# Patient Record
Sex: Male | Born: 1950 | Race: White | Hispanic: No | Marital: Married | Smoking: Never smoker
Health system: Southern US, Community
[De-identification: ages and names within clinical notes are randomized; demographics above are authoritative.]

## PROBLEM LIST (undated history)

## (undated) DIAGNOSIS — F3289 Other specified depressive episodes: Secondary | ICD-10-CM

## (undated) DIAGNOSIS — E039 Hypothyroidism, unspecified: Secondary | ICD-10-CM

## (undated) DIAGNOSIS — D649 Anemia, unspecified: Secondary | ICD-10-CM

## (undated) DIAGNOSIS — M1712 Unilateral primary osteoarthritis, left knee: Secondary | ICD-10-CM

## (undated) DIAGNOSIS — M1711 Unilateral primary osteoarthritis, right knee: Secondary | ICD-10-CM

## (undated) DIAGNOSIS — E079 Disorder of thyroid, unspecified: Secondary | ICD-10-CM

## (undated) DIAGNOSIS — Z96651 Presence of right artificial knee joint: Secondary | ICD-10-CM

## (undated) DIAGNOSIS — H269 Unspecified cataract: Secondary | ICD-10-CM

## (undated) DIAGNOSIS — E119 Type 2 diabetes mellitus without complications: Secondary | ICD-10-CM

## (undated) DIAGNOSIS — N400 Enlarged prostate without lower urinary tract symptoms: Secondary | ICD-10-CM

## (undated) DIAGNOSIS — F329 Major depressive disorder, single episode, unspecified: Secondary | ICD-10-CM

## (undated) DIAGNOSIS — E7800 Pure hypercholesterolemia, unspecified: Secondary | ICD-10-CM

## (undated) DIAGNOSIS — E291 Testicular hypofunction: Secondary | ICD-10-CM

## (undated) DIAGNOSIS — K219 Gastro-esophageal reflux disease without esophagitis: Secondary | ICD-10-CM

## (undated) DIAGNOSIS — I2510 Atherosclerotic heart disease of native coronary artery without angina pectoris: Secondary | ICD-10-CM

## (undated) HISTORY — PX: INGUINAL HERNIA REPAIR: SUR1180

## (undated) HISTORY — PX: EYE SURGERY: SHX253

## (undated) HISTORY — DX: Hypothyroidism, unspecified: E03.9

## (undated) HISTORY — PX: COLONOSCOPY: SHX174

## (undated) HISTORY — DX: Benign prostatic hyperplasia without lower urinary tract symptoms: N40.0

## (undated) HISTORY — DX: Pure hypercholesterolemia, unspecified: E78.00

## (undated) HISTORY — PX: KNEE ARTHROSCOPY W/ ACL RECONSTRUCTION: SHX1858

## (undated) HISTORY — DX: Disorder of thyroid, unspecified: E07.9

## (undated) HISTORY — DX: Testicular hypofunction: E29.1

## (undated) HISTORY — DX: Unspecified cataract: H26.9

## (undated) HISTORY — PX: JOINT REPLACEMENT: SHX530

## (undated) HISTORY — DX: Atherosclerotic heart disease of native coronary artery without angina pectoris: I25.10

## (undated) HISTORY — DX: Gastro-esophageal reflux disease without esophagitis: K21.9

## (undated) HISTORY — PX: KNEE ARTHROSCOPY: SHX127

## (undated) HISTORY — DX: Other specified depressive episodes: F32.89

## (undated) HISTORY — PX: ROTATOR CUFF REPAIR: SHX139

## (undated) HISTORY — DX: Major depressive disorder, single episode, unspecified: F32.9

## (undated) HISTORY — DX: Type 2 diabetes mellitus without complications: E11.9

---

## 1997-11-16 ENCOUNTER — Ambulatory Visit (HOSPITAL_COMMUNITY): Admission: RE | Admit: 1997-11-16 | Discharge: 1997-11-16 | Payer: Self-pay | Admitting: Urology

## 1998-11-28 ENCOUNTER — Ambulatory Visit (HOSPITAL_BASED_OUTPATIENT_CLINIC_OR_DEPARTMENT_OTHER): Admission: RE | Admit: 1998-11-28 | Discharge: 1998-11-28 | Payer: Self-pay | Admitting: Orthopedic Surgery

## 2000-07-15 HISTORY — PX: OTHER SURGICAL HISTORY: SHX169

## 2000-07-23 ENCOUNTER — Encounter: Admission: RE | Admit: 2000-07-23 | Discharge: 2000-10-21 | Payer: Self-pay | Admitting: Endocrinology

## 2001-02-21 ENCOUNTER — Encounter: Payer: Self-pay | Admitting: Gastroenterology

## 2002-05-23 HISTORY — PX: VASECTOMY: SHX75

## 2003-06-16 HISTORY — PX: ESOPHAGOGASTRODUODENOSCOPY: SHX1529

## 2004-07-05 ENCOUNTER — Ambulatory Visit: Payer: Self-pay | Admitting: Endocrinology

## 2004-07-13 ENCOUNTER — Ambulatory Visit: Payer: Self-pay | Admitting: Endocrinology

## 2005-02-07 ENCOUNTER — Ambulatory Visit: Payer: Self-pay | Admitting: Endocrinology

## 2006-03-19 ENCOUNTER — Ambulatory Visit: Payer: Self-pay | Admitting: Endocrinology

## 2006-04-10 ENCOUNTER — Ambulatory Visit: Payer: Self-pay | Admitting: Endocrinology

## 2006-04-10 LAB — CONVERTED CEMR LAB
ALT: 15 units/L (ref 0–40)
AST: 21 units/L (ref 0–37)
Albumin: 3.7 g/dL (ref 3.5–5.2)
Alkaline Phosphatase: 69 units/L (ref 39–117)
BUN: 14 mg/dL (ref 6–23)
Basophils Absolute: 0.1 10*3/uL (ref 0.0–0.1)
Basophils Relative: 0.9 % (ref 0.0–1.0)
Bilirubin Urine: NEGATIVE
Bilirubin, Direct: 0.1 mg/dL (ref 0.0–0.3)
CO2: 29 meq/L (ref 19–32)
Calcium: 9.1 mg/dL (ref 8.4–10.5)
Chloride: 105 meq/L (ref 96–112)
Cholesterol: 122 mg/dL (ref 0–200)
Creatinine, Ser: 1.1 mg/dL (ref 0.4–1.5)
Creatinine,U: 65.8 mg/dL
Eosinophils Absolute: 0.5 10*3/uL (ref 0.0–0.6)
Eosinophils Relative: 5.9 % — ABNORMAL HIGH (ref 0.0–5.0)
GFR calc Af Amer: 89 mL/min
GFR calc non Af Amer: 74 mL/min
Glucose, Bld: 95 mg/dL (ref 70–99)
HCT: 43.8 % (ref 39.0–52.0)
HDL: 32.5 mg/dL — ABNORMAL LOW (ref >39.0)
Hemoglobin: 14.3 g/dL (ref 13.0–17.0)
Hgb A1c MFr Bld: 6.9 % — ABNORMAL HIGH (ref 4.6–6.0)
Ketones, ur: NEGATIVE mg/dL
LDL Cholesterol: 61 mg/dL (ref 0–99)
Leukocytes, UA: NEGATIVE
Lymphocytes Relative: 22.2 % (ref 12.0–46.0)
MCHC: 32.5 g/dL (ref 30.0–36.0)
MCV: 84.7 fL (ref 78.0–100.0)
Microalb Creat Ratio: 9.1 mg/g (ref 0.0–30.0)
Microalb, Ur: 0.6 mg/dL (ref 0.0–1.9)
Monocytes Absolute: 0.4 10*3/uL (ref 0.2–0.7)
Monocytes Relative: 4.5 % (ref 3.0–11.0)
Neutro Abs: 6 10*3/uL (ref 1.4–7.7)
Neutrophils Relative %: 66.5 % (ref 43.0–77.0)
Nitrite: NEGATIVE
PSA: 1.41 ng/mL (ref 0.10–4.00)
Platelets: 213 10*3/uL (ref 150–400)
Potassium: 4.4 meq/L (ref 3.5–5.1)
RBC: 5.17 M/uL (ref 4.22–5.81)
RDW: 13.1 % (ref 11.5–14.6)
Sodium: 141 meq/L (ref 135–145)
Specific Gravity, Urine: 1.015 (ref 1.000–1.03)
TSH: 9.09 microintl units/mL — ABNORMAL HIGH (ref 0.35–5.50)
Testosterone: 462.22 ng/dL (ref 350.00–890)
Total Bilirubin: 0.6 mg/dL (ref 0.3–1.2)
Total CHOL/HDL Ratio: 3.8
Total Protein, Urine: NEGATIVE mg/dL
Total Protein: 6.4 g/dL (ref 6.0–8.3)
Triglycerides: 141 mg/dL (ref 0–149)
Urine Glucose: NEGATIVE mg/dL
Urobilinogen, UA: 0.2 (ref 0.0–1.0)
VLDL: 28 mg/dL (ref 0–40)
WBC: 9 10*3/uL (ref 4.5–10.5)
pH: 5.5 (ref 5.0–8.0)

## 2006-04-11 ENCOUNTER — Ambulatory Visit: Payer: Self-pay | Admitting: Endocrinology

## 2006-06-06 ENCOUNTER — Ambulatory Visit: Payer: Self-pay | Admitting: Endocrinology

## 2006-06-06 LAB — CONVERTED CEMR LAB: Hgb A1c MFr Bld: 6.1 % — ABNORMAL HIGH (ref 4.6–6.0)

## 2006-09-20 ENCOUNTER — Encounter: Payer: Self-pay | Admitting: Endocrinology

## 2006-09-20 DIAGNOSIS — E119 Type 2 diabetes mellitus without complications: Secondary | ICD-10-CM | POA: Insufficient documentation

## 2007-03-03 ENCOUNTER — Telehealth (INDEPENDENT_AMBULATORY_CARE_PROVIDER_SITE_OTHER): Payer: Self-pay | Admitting: *Deleted

## 2007-03-05 ENCOUNTER — Ambulatory Visit: Payer: Self-pay | Admitting: Endocrinology

## 2007-03-05 DIAGNOSIS — E039 Hypothyroidism, unspecified: Secondary | ICD-10-CM | POA: Insufficient documentation

## 2007-03-06 ENCOUNTER — Encounter (INDEPENDENT_AMBULATORY_CARE_PROVIDER_SITE_OTHER): Payer: Self-pay | Admitting: *Deleted

## 2007-03-07 ENCOUNTER — Ambulatory Visit: Payer: Self-pay | Admitting: Endocrinology

## 2007-06-18 ENCOUNTER — Ambulatory Visit: Payer: Self-pay | Admitting: Endocrinology

## 2007-06-19 ENCOUNTER — Ambulatory Visit: Payer: Self-pay | Admitting: Endocrinology

## 2007-06-19 DIAGNOSIS — E1169 Type 2 diabetes mellitus with other specified complication: Secondary | ICD-10-CM | POA: Insufficient documentation

## 2007-06-19 DIAGNOSIS — F329 Major depressive disorder, single episode, unspecified: Secondary | ICD-10-CM | POA: Insufficient documentation

## 2007-06-19 DIAGNOSIS — E291 Testicular hypofunction: Secondary | ICD-10-CM | POA: Insufficient documentation

## 2007-06-19 DIAGNOSIS — F32A Depression, unspecified: Secondary | ICD-10-CM | POA: Insufficient documentation

## 2007-06-19 DIAGNOSIS — E785 Hyperlipidemia, unspecified: Secondary | ICD-10-CM

## 2007-06-19 LAB — CONVERTED CEMR LAB
AST: 23 units/L (ref 0–37)
Alkaline Phosphatase: 54 units/L (ref 39–117)
Basophils Absolute: 0 10*3/uL (ref 0.0–0.1)
Bilirubin, Direct: 0.1 mg/dL (ref 0.0–0.3)
Chloride: 106 meq/L (ref 96–112)
Cholesterol: 122 mg/dL (ref 0–200)
Eosinophils Absolute: 0.5 10*3/uL (ref 0.0–0.7)
Eosinophils Relative: 6.8 % — ABNORMAL HIGH (ref 0.0–5.0)
GFR calc non Af Amer: 74 mL/min
LDL Cholesterol: 73 mg/dL (ref 0–99)
MCHC: 32.6 g/dL (ref 30.0–36.0)
MCV: 87 fL (ref 78.0–100.0)
Neutrophils Relative %: 65.5 % (ref 43.0–77.0)
Platelets: 167 10*3/uL (ref 150–400)
Potassium: 4.3 meq/L (ref 3.5–5.1)
Sodium: 139 meq/L (ref 135–145)
Total Bilirubin: 0.6 mg/dL (ref 0.3–1.2)
Urine Glucose: NEGATIVE mg/dL
Urobilinogen, UA: 0.2 (ref 0.0–1.0)
VLDL: 12 mg/dL (ref 0–40)
WBC: 7.9 10*3/uL (ref 4.5–10.5)

## 2007-08-11 ENCOUNTER — Encounter: Payer: Self-pay | Admitting: Endocrinology

## 2007-08-21 ENCOUNTER — Ambulatory Visit: Payer: Self-pay | Admitting: Internal Medicine

## 2007-08-21 DIAGNOSIS — H9319 Tinnitus, unspecified ear: Secondary | ICD-10-CM | POA: Insufficient documentation

## 2007-09-12 ENCOUNTER — Encounter: Payer: Self-pay | Admitting: Endocrinology

## 2007-09-18 ENCOUNTER — Encounter: Admission: RE | Admit: 2007-09-18 | Discharge: 2007-09-18 | Payer: Self-pay | Admitting: Otolaryngology

## 2007-10-30 ENCOUNTER — Ambulatory Visit: Payer: Self-pay | Admitting: Endocrinology

## 2007-11-01 LAB — CONVERTED CEMR LAB
Hgb A1c MFr Bld: 6.6 % — ABNORMAL HIGH (ref 4.6–6.0)
PSA: 1.72 ng/mL (ref 0.10–4.00)

## 2007-11-06 ENCOUNTER — Ambulatory Visit: Payer: Self-pay | Admitting: Endocrinology

## 2007-11-06 DIAGNOSIS — H9190 Unspecified hearing loss, unspecified ear: Secondary | ICD-10-CM | POA: Insufficient documentation

## 2007-11-12 ENCOUNTER — Telehealth (INDEPENDENT_AMBULATORY_CARE_PROVIDER_SITE_OTHER): Payer: Self-pay | Admitting: *Deleted

## 2008-01-27 ENCOUNTER — Telehealth (INDEPENDENT_AMBULATORY_CARE_PROVIDER_SITE_OTHER): Payer: Self-pay | Admitting: *Deleted

## 2008-02-09 ENCOUNTER — Telehealth: Payer: Self-pay | Admitting: Endocrinology

## 2008-02-25 ENCOUNTER — Ambulatory Visit: Payer: Self-pay | Admitting: Gastroenterology

## 2008-03-03 ENCOUNTER — Telehealth: Payer: Self-pay | Admitting: Gastroenterology

## 2008-03-15 ENCOUNTER — Ambulatory Visit: Payer: Self-pay | Admitting: Endocrinology

## 2008-03-17 LAB — CONVERTED CEMR LAB
ALT: 25 units/L (ref 0–53)
AST: 28 units/L (ref 0–37)
Albumin: 3.7 g/dL (ref 3.5–5.2)
BUN: 15 mg/dL (ref 6–23)
Basophils Absolute: 0.1 10*3/uL (ref 0.0–0.1)
Basophils Relative: 0.6 % (ref 0.0–3.0)
CO2: 28 meq/L (ref 19–32)
Calcium: 9.1 mg/dL (ref 8.4–10.5)
Chloride: 106 meq/L (ref 96–112)
Cholesterol: 116 mg/dL (ref 0–200)
Creatinine, Ser: 1.1 mg/dL (ref 0.4–1.5)
Eosinophils Relative: 6.5 % — ABNORMAL HIGH (ref 0.0–5.0)
Glucose, Bld: 103 mg/dL — ABNORMAL HIGH (ref 70–99)
Hemoglobin: 14.2 g/dL (ref 13.0–17.0)
Hgb A1c MFr Bld: 6.8 % — ABNORMAL HIGH (ref 4.6–6.0)
Ketones, ur: NEGATIVE mg/dL
Lymphocytes Relative: 17.5 % (ref 12.0–46.0)
MCHC: 33.6 g/dL (ref 30.0–36.0)
Neutro Abs: 6.5 10*3/uL (ref 1.4–7.7)
PSA: 3.26 ng/mL (ref 0.10–4.00)
RBC: 4.98 M/uL (ref 4.22–5.81)
Specific Gravity, Urine: 1.015 (ref 1.000–1.03)
Total CHOL/HDL Ratio: 3.1
Total Protein: 6 g/dL (ref 6.0–8.3)
Triglycerides: 62 mg/dL (ref 0–149)
Urine Glucose: NEGATIVE mg/dL
WBC: 9.5 10*3/uL (ref 4.5–10.5)
pH: 5.5 (ref 5.0–8.0)

## 2008-03-18 ENCOUNTER — Telehealth (INDEPENDENT_AMBULATORY_CARE_PROVIDER_SITE_OTHER): Payer: Self-pay | Admitting: *Deleted

## 2008-03-18 ENCOUNTER — Ambulatory Visit: Payer: Self-pay | Admitting: Endocrinology

## 2008-03-19 ENCOUNTER — Encounter: Payer: Self-pay | Admitting: Endocrinology

## 2008-03-19 ENCOUNTER — Ambulatory Visit: Payer: Self-pay | Admitting: Internal Medicine

## 2008-03-25 ENCOUNTER — Telehealth: Payer: Self-pay | Admitting: Gastroenterology

## 2008-04-07 ENCOUNTER — Telehealth (INDEPENDENT_AMBULATORY_CARE_PROVIDER_SITE_OTHER): Payer: Self-pay | Admitting: *Deleted

## 2008-07-28 ENCOUNTER — Telehealth (INDEPENDENT_AMBULATORY_CARE_PROVIDER_SITE_OTHER): Payer: Self-pay | Admitting: *Deleted

## 2008-07-29 ENCOUNTER — Telehealth (INDEPENDENT_AMBULATORY_CARE_PROVIDER_SITE_OTHER): Payer: Self-pay | Admitting: *Deleted

## 2008-11-12 ENCOUNTER — Telehealth: Payer: Self-pay | Admitting: Endocrinology

## 2009-01-17 ENCOUNTER — Telehealth: Payer: Self-pay | Admitting: Endocrinology

## 2009-03-03 ENCOUNTER — Encounter: Payer: Self-pay | Admitting: Endocrinology

## 2009-03-29 ENCOUNTER — Ambulatory Visit: Payer: Self-pay | Admitting: Endocrinology

## 2009-03-31 ENCOUNTER — Telehealth: Payer: Self-pay | Admitting: Endocrinology

## 2009-07-28 ENCOUNTER — Ambulatory Visit: Payer: Self-pay | Admitting: Endocrinology

## 2009-07-28 LAB — CONVERTED CEMR LAB: Hgb A1c MFr Bld: 6.8 % — ABNORMAL HIGH (ref 4.6–6.5)

## 2009-10-21 ENCOUNTER — Ambulatory Visit: Payer: Self-pay | Admitting: Endocrinology

## 2009-10-21 DIAGNOSIS — R209 Unspecified disturbances of skin sensation: Secondary | ICD-10-CM | POA: Insufficient documentation

## 2009-10-21 LAB — CONVERTED CEMR LAB
Hgb A1c MFr Bld: 7.4 % — ABNORMAL HIGH (ref 4.6–6.5)
Vitamin B-12: 268 pg/mL (ref 211–911)

## 2009-12-09 ENCOUNTER — Ambulatory Visit: Payer: Self-pay | Admitting: Endocrinology

## 2009-12-09 DIAGNOSIS — L738 Other specified follicular disorders: Secondary | ICD-10-CM | POA: Insufficient documentation

## 2010-02-02 ENCOUNTER — Telehealth: Payer: Self-pay | Admitting: Endocrinology

## 2010-02-07 ENCOUNTER — Telehealth: Payer: Self-pay | Admitting: Endocrinology

## 2010-02-08 ENCOUNTER — Telehealth: Payer: Self-pay | Admitting: Endocrinology

## 2010-02-08 ENCOUNTER — Other Ambulatory Visit: Payer: Self-pay | Admitting: Endocrinology

## 2010-02-08 ENCOUNTER — Ambulatory Visit
Admission: RE | Admit: 2010-02-08 | Discharge: 2010-02-08 | Payer: Self-pay | Source: Home / Self Care | Attending: Endocrinology | Admitting: Endocrinology

## 2010-02-08 LAB — HEMOGLOBIN A1C: Hgb A1c MFr Bld: 7 % — ABNORMAL HIGH (ref 4.6–6.5)

## 2010-02-08 LAB — LIPID PANEL
Cholesterol: 146 mg/dL (ref 0–200)
HDL: 47.1 mg/dL (ref >39.00)
LDL Cholesterol: 84 mg/dL (ref 0–99)
Total CHOL/HDL Ratio: 3
Triglycerides: 73 mg/dL (ref 0.0–149.0)
VLDL: 14.6 mg/dL (ref 0.0–40.0)

## 2010-02-08 LAB — BASIC METABOLIC PANEL
BUN: 19 mg/dL (ref 6–23)
CO2: 29 mEq/L (ref 19–32)
Calcium: 9.6 mg/dL (ref 8.4–10.5)
Chloride: 106 mEq/L (ref 96–112)
Creatinine, Ser: 1.2 mg/dL (ref 0.4–1.5)
GFR: 67.68 mL/min (ref >60.00)
Glucose, Bld: 89 mg/dL (ref 70–99)
Potassium: 4.8 mEq/L (ref 3.5–5.1)
Sodium: 142 mEq/L (ref 135–145)

## 2010-02-08 LAB — URINALYSIS
Bilirubin Urine: NEGATIVE
Ketones, ur: NEGATIVE
Leukocytes, UA: NEGATIVE
Nitrite: NEGATIVE
Specific Gravity, Urine: 1.02 (ref 1.000–1.030)
Total Protein, Urine: NEGATIVE
Urine Glucose: NEGATIVE
Urobilinogen, UA: 0.2 (ref 0.0–1.0)
pH: 5.5 (ref 5.0–8.0)

## 2010-02-08 LAB — CBC WITH DIFFERENTIAL/PLATELET
Basophils Absolute: 0 10*3/uL (ref 0.0–0.1)
Basophils Relative: 0.2 % (ref 0.0–3.0)
Eosinophils Absolute: 0.3 10*3/uL (ref 0.0–0.7)
Eosinophils Relative: 2.6 % (ref 0.0–5.0)
HCT: 40.7 % (ref 39.0–52.0)
Hemoglobin: 13.8 g/dL (ref 13.0–17.0)
Lymphocytes Relative: 14.1 % (ref 12.0–46.0)
Lymphs Abs: 1.4 10*3/uL (ref 0.7–4.0)
MCHC: 33.8 g/dL (ref 30.0–36.0)
MCV: 89.2 fl (ref 78.0–100.0)
Monocytes Absolute: 0.5 10*3/uL (ref 0.1–1.0)
Monocytes Relative: 5.5 % (ref 3.0–12.0)
Neutro Abs: 7.5 10*3/uL (ref 1.4–7.7)
Neutrophils Relative %: 77.6 % — ABNORMAL HIGH (ref 43.0–77.0)
Platelets: 181 10*3/uL (ref 150.0–400.0)
RBC: 4.57 Mil/uL (ref 4.22–5.81)
RDW: 14.7 % — ABNORMAL HIGH (ref 11.5–14.6)
WBC: 9.6 10*3/uL (ref 4.5–10.5)

## 2010-02-08 LAB — HEPATIC FUNCTION PANEL
ALT: 25 U/L (ref 0–53)
AST: 27 U/L (ref 0–37)
Albumin: 4 g/dL (ref 3.5–5.2)
Alkaline Phosphatase: 55 U/L (ref 39–117)
Bilirubin, Direct: 0.1 mg/dL (ref 0.0–0.3)
Total Bilirubin: 0.4 mg/dL (ref 0.3–1.2)
Total Protein: 6.2 g/dL (ref 6.0–8.3)

## 2010-02-08 LAB — TSH: TSH: 2.81 u[IU]/mL (ref 0.35–5.50)

## 2010-02-08 LAB — PSA: PSA: 3.31 ng/mL (ref 0.10–4.00)

## 2010-02-15 ENCOUNTER — Encounter: Payer: Self-pay | Admitting: Gastroenterology

## 2010-02-19 LAB — CONVERTED CEMR LAB
Albumin: 3.8 g/dL (ref 3.5–5.2)
Basophils Absolute: 0 10*3/uL (ref 0.0–0.1)
Basophils Relative: 0.2 % (ref 0.0–3.0)
Bilirubin Urine: NEGATIVE
CO2: 31 meq/L (ref 19–32)
Calcium: 9.3 mg/dL (ref 8.4–10.5)
Chloride: 110 meq/L (ref 96–112)
Creatinine, Ser: 1.2 mg/dL (ref 0.4–1.5)
Creatinine,U: 170.8 mg/dL
Eosinophils Absolute: 0.3 10*3/uL (ref 0.0–0.7)
Fecal Occult Blood: NEGATIVE
Glucose, Bld: 123 mg/dL — ABNORMAL HIGH (ref 70–99)
HDL: 44 mg/dL (ref >39.00)
Hemoglobin: 14.1 g/dL (ref 13.0–17.0)
Hgb A1c MFr Bld: 6.8 % — ABNORMAL HIGH (ref 4.6–6.5)
Ketones, ur: NEGATIVE mg/dL
Leukocytes, UA: NEGATIVE
Lymphocytes Relative: 16.9 % (ref 12.0–46.0)
MCHC: 32.9 g/dL (ref 30.0–36.0)
MCV: 89.5 fL (ref 78.0–100.0)
Microalb, Ur: 1.6 mg/dL (ref 0.0–1.9)
Monocytes Absolute: 0.5 10*3/uL (ref 0.1–1.0)
Neutro Abs: 7.3 10*3/uL (ref 1.4–7.7)
OCCULT 4: NEGATIVE
OCCULT 5: NEGATIVE
RBC: 4.8 M/uL (ref 4.22–5.81)
RDW: 12.8 % (ref 11.5–14.6)
Sodium: 143 meq/L (ref 135–145)
Specific Gravity, Urine: >=1.03 (ref 1.000–1.030)
TSH: 2.35 microintl units/mL (ref 0.35–5.50)
Total CHOL/HDL Ratio: 3
Total Protein, Urine: NEGATIVE mg/dL
Total Protein: 6.5 g/dL (ref 6.0–8.3)
Triglycerides: 138 mg/dL (ref 0.0–149.0)
Urine Glucose: 100 mg/dL
pH: 5.5 (ref 5.0–8.0)

## 2010-02-23 NOTE — Assessment & Plan Note (Signed)
Summary: FU AND REFILLS/NWS  #   Vital Signs:  Patient profile:   60 year old male Height:      66 inches (167.64 cm) Weight:      159.50 pounds (72.50 kg) BMI:     25.84 O2 Sat:      96 % on Room air Temp:     97.0 degrees F (36.11 degrees C) oral Pulse rate:   80 / minute BP sitting:   124 / 78  (left arm) Cuff size:   regular  Vitals Entered By: Josph Macho RMA (March 29, 2009 10:04 AM)  O2 Flow:  Room air CC: Follow-up visit/ pt needs refills on Zocor/ CF Is Patient Diabetic? Yes   CC:  Follow-up visit/ pt needs refills on Zocor/ CF.  History of Present Illness: here for regular wellness examination.  He's feeling pretty well in general, and does not smoke.  alcohol is rare.  Current Medications (verified): 1)  Multivitamins   Tabs (Multiple Vitamin) .... Take 1 By Mouth Qd 2)  Bl Vitamin E 400 Unit  Caps (Vitamin E) .... Take 1 By Mouth Qd 3)  Bl Vitamin C 500 Mg  Tabs (Ascorbic Acid) .... Take 1 By Mouth Qd 4)  Clomiphene Citrate 50 Mg  Tabs (Clomiphene Citrate) .... Take 1/4 Tab Qiw 5)  Zocor 80 Mg  Tabs (Simvastatin) .... Take 1 By Mouth Qd 6)  Januvia 100 Mg Tabs (Sitagliptin Phosphate) .... Take 1 Tablet By Mouth Once A Day 7)  Metformin Hcl 500 Mg Tb24 (Metformin Hcl) .... Take 2 Tablet By Mouth Twice A Day 8)  Synthroid 50 Mcg Tabs (Levothyroxine Sodium) .... Take 1 Tablet By Mouth Once A Day 9)  Actos 15 Mg  Tabs (Pioglitazone Hcl) .... Qd 10)  Cialis 20 Mg Tabs (Tadalafil) .... For As Needed Use 11)  Fish Oil 1000mg  .... Once Daily  Allergies (verified): No Known Drug Allergies  Past History:  Past Medical History: Last updated: 06/19/2007 DEPRESSIVE DISORDER NOT ELSEWHERE CLASSIFIED (ICD-311) HYPERCHOLESTEROLEMIA (ICD-272.0) HYPOGONADISM, MALE (ICD-257.2) SPECIAL SCREENING MALIGNANT NEOPLASM OF PROSTATE (ICD-V76.44) UNSPECIFIED HYPOTHYROIDISM (ICD-244.9) DIABETES MELLITUS, TYPE II (ICD-250.00)  Family History: Reviewed history from 03/18/2008  and no changes required. no cancer  Social History: Reviewed history from 03/18/2008 and no changes required. Never Smoked Alcohol use-yes works for [employer]  Review of Systems  The patient denies weight loss, weight gain, vision loss, decreased hearing, chest pain, syncope, dyspnea on exertion, prolonged cough, headaches, abdominal pain, melena, hematochezia, severe indigestion/heartburn, hematuria, suspicious skin lesions, and depression.         ne fever, other than a recent illness.  Physical Exam  General:  normal appearance.   Head:  head: no deformity eyes: no periorbital swelling, no proptosis external nose and ears are normal mouth: no lesion seen Neck:  Supple without thyroid enlargement or tenderness.  Lungs:  Clear to auscultation bilaterally. Normal respiratory effort.  Heart:  Regular rate and rhythm without murmurs or gallops noted. Normal S1,S2.   Abdomen:  abdomen is soft, nontender.  no hepatosplenomegaly.   not distended.  no hernia  Rectal:  normal external and internal exam.  heme neg  Prostate:  Normal size prostate without masses or tenderness.  Msk:  muscle bulk and strength are grossly normal.  no obvious joint swelling.  gait is normal and steady  Pulses:  dorsalis pedis intact bilat.  no carotid bruit  Extremities:  no deformity.  no ulcer on the feet.  feet are of  normal color and temp.  no edema  Neurologic:  sensation is intact to touch on the feet cn 2-12 grossly intact.   readily moves all 4's.    Skin:  normal texture and temp.  no rash.  not diaphoretic  Cervical Nodes:  No significant adenopathy.  Psych:  Alert and cooperative; normal mood and affect; normal attention span and concentration.     Impression & Recommendations:  Problem # 1:  ROUTINE GENERAL MEDICAL EXAM@HEALTH  CARE FACL (ICD-V70.0)  Medications Added to Medication List This Visit: 1)  Fish Oil 1000mg   .... Once daily 2)  Cialis 5 Mg Tabs (Tadalafil) .Marland Kitchen.. 1 once  daily  Other Orders: EKG w/ Interpretation (93000) Pneumococcal Vaccine (56213) Admin 1st Vaccine (08657) TLB-Lipid Panel (80061-LIPID) TLB-BMP (Basic Metabolic Panel-BMET) (80048-METABOL) TLB-CBC Platelet - w/Differential (85025-CBCD) TLB-Hepatic/Liver Function Pnl (80076-HEPATIC) TLB-TSH (Thyroid Stimulating Hormone) (84443-TSH) TLB-A1C / Hgb A1C (Glycohemoglobin) (83036-A1C) TLB-Microalbumin/Creat Ratio, Urine (82043-MALB) TLB-PSA (Prostate Specific Antigen) (84153-PSA) TLB-Testosterone, Total (84403-TESTO) TLB-Udip w/ Micro (81001-URINE) Est. Patient 40-64 years (84696)   Patient Instructions: 1)  pending the test results, please continue the same medications for now 2)  tests are being ordered for you today.  a few days after the test(s), please call [phone] to hear your test results. 3)  Please schedule a follow-up appointment in 6 months. 4)  we discussed the recommendations of the preventive services task force 5)  (update: i left message on phone-tree:  rx as we discussed) Prescriptions: ZOCOR 80 MG  TABS (SIMVASTATIN) take 1 by mouth qd  #90 x 3   Entered and Authorized by:   Minus Breeding MD   Signed by:   Minus Breeding MD on 03/29/2009   Method used:   Electronically to        [facility] [address] (retail)       [phone]       [place], [place]  [address]       Ph: [phone]       Fax: [phone]   RxID:   [id number] CLOMIPHENE CITRATE 50 MG  TABS (CLOMIPHENE CITRATE) take 1/4 tab qiw  #5 Each x 11   Entered and Authorized by:   Minus Breeding MD   Signed by:   Minus Breeding MD on 03/29/2009   Method used:   Electronically to        [facility] [address] (retail)       [phone]       [place], [place]  [address]       Ph: [phone]       Fax: [phone]   RxID:   [id number] CIALIS 5 MG TABS (TADALAFIL) 1 once daily  #30 x 11   Entered and Authorized by:    Minus Breeding MD   Signed by:   Minus Breeding MD on 03/29/2009   Method used:   Electronically to        [facility] [address] (retail)       [phone]       [place], [place]  [address]       Ph: [phone]       Fax: [phone]   RxID:   [id number]    Immunizations Administered:  Pneumonia Vaccine:    Vaccine Type: Pneumovax    Site: left deltoid  Mfr: Merck    Dose: 0.5 ml    Route: IM    Given by: Josph Macho RMA    Exp. Date: 09/05/2010    Lot #: [id number]    VIS given: 08/20/95 version given March 29, 2009.

## 2010-02-23 NOTE — Assessment & Plan Note (Signed)
Summary: numb finger/cd   Vital Signs:  Patient profile:   60 year old male Height:      67 inches (170.18 cm) Weight:      160 pounds (72.73 kg) BMI:     25.15 O2 Sat:      95 % on Room air Temp:     98.9 degrees F (37.17 degrees C) oral Pulse rate:   72 / minute BP sitting:   114 / 72  (left arm) Cuff size:   regular  Vitals Entered By: Brenton Grills MA (October 21, 2009 1:25 PM)  O2 Flow:  Room air CC: numbness in left pinky finger, knot under right arm/aj Is Patient Diabetic? Yes   CC:  numbness in left pinky finger and knot under right arm/aj.  History of Present Illness: pt states few days of slight numbness at the left little finger, worse at the distal, palmar aspect, at the finger pad.  no assoc numbness.  unable to cite precip factor, such as local injury.   also few days of swelling at the right axilla  Current Medications (verified): 1)  Multivitamins   Tabs (Multiple Vitamin) .... Take 1 By Mouth Qd 2)  Bl Vitamin E 400 Unit  Caps (Vitamin E) .... Take 1 By Mouth Qd 3)  Bl Vitamin C 500 Mg  Tabs (Ascorbic Acid) .... Take 1 By Mouth Qd 4)  Clomiphene Citrate 50 Mg  Tabs (Clomiphene Citrate) .... Take 1/4 Tab Qiw 5)  Zocor 80 Mg  Tabs (Simvastatin) .... Take 1 By Mouth Qd 6)  Januvia 100 Mg Tabs (Sitagliptin Phosphate) .... Take 1 Tablet By Mouth Once A Day 7)  Metformin Hcl 500 Mg Tb24 (Metformin Hcl) .... Take 2 Tablet By Mouth Twice A Day 8)  Synthroid 50 Mcg Tabs (Levothyroxine Sodium) .... Take 1 Tablet By Mouth Once A Day 9)  Actos 15 Mg  Tabs (Pioglitazone Hcl) .... Qd 10)  Fish Oil 1000mg  .... 1400 Mg Once Daily 11)  Cialis 5 Mg Tabs (Tadalafil) .Marland Kitchen.. 1 Once Daily  Allergies (verified): No Known Drug Allergies  Past History:  Past Medical History: Last updated: 06/19/2007 DEPRESSIVE DISORDER NOT ELSEWHERE CLASSIFIED (ICD-311) HYPERCHOLESTEROLEMIA (ICD-272.0) HYPOGONADISM, MALE (ICD-257.2) SPECIAL SCREENING MALIGNANT NEOPLASM OF PROSTATE  (ICD-V76.44) UNSPECIFIED HYPOTHYROIDISM (ICD-244.9) DIABETES MELLITUS, TYPE II (ICD-250.00)  Review of Systems  The patient denies fever.         also has left axillary pain  Physical Exam  General:  normal appearance.   Msk:  right axilla: has 1 cm area of erythema and swelling, but no fluctuance.  no drainage. Extremities:  left little finger: normal color and temp.  there are oa changes.  there are several mild deviations at the pip and dip joints (pt says these are chronic).  Neurologic:  sensation is decreased to touch on the palmar aspect of the left little finger.   Additional Exam:   Hemoglobin A1C       [H]  7.4 %       Impression & Recommendations:  Problem # 1:  NUMBNESS (ICD-782.0) uncertain etiology  Problem # 2:  pustule new  Problem # 3:  DIABETES MELLITUS, TYPE II (ICD-250.00) Assessment: Deteriorated  Medications Added to Medication List This Visit: 1)  Fish Oil 1000mg   .... 1400 mg once daily 2)  Doxycycline Hyclate 100 Mg Caps (Doxycycline hyclate) .Marland Kitchen.. 1 tab two times a day 3)  Actos 45 Mg Tabs (Pioglitazone hcl) .Marland Kitchen.. 1 tab once daily  Other Orders: TLB-B12,  Serum-Total ONLY (82607-B12) TLB-A1C / Hgb A1C (Glycohemoglobin) (83036-A1C) Est. Patient Level IV (98119)  Patient Instructions: 1)  blood tests are being ordered for you today.  please call [phone] to hear your test results. 2)  i am happy to refer you to a neurologist to investigate the cause--please call if you want to go. 3)  doxycycline 100 mg two times a day 4)  (update: i left message on phone-tree:  increase actos to 45 mg once daily). Prescriptions: ACTOS 45 MG TABS (PIOGLITAZONE HCL) 1 tab once daily  #90 x 3   Entered and Authorized by:   Minus Breeding MD   Signed by:   Minus Breeding MD on 10/21/2009   Method used:   Faxed to ...       [facility] (mail-order)             , [place]         Ph: [phone]       Fax: [phone]   RxID:   [id number] DOXYCYCLINE HYCLATE 100 MG  CAPS (DOXYCYCLINE HYCLATE) 1 tab two times a day  #14 x 0   Entered and Authorized by:   Minus Breeding MD   Signed by:   Minus Breeding MD on 10/21/2009   Method used:   Electronically to        [facility] [phone]* (retail)       [address]       [place], [place]  [address]       Ph: [phone]       Fax: [phone]   RxID:   [id number] JANUVIA 100 MG TABS (SITAGLIPTIN PHOSPHATE) Take 1 tablet by mouth once a day  #30 x 11   Entered and Authorized by:   Minus Breeding MD   Signed by:   Minus Breeding MD on 10/21/2009   Method used:   Print then Give to Patient   RxID:   [id number]

## 2010-02-23 NOTE — Letter (Signed)
Summary: Colonoscopy Letter  [facility]  [address] [place], [place] [address]   Phone: [phone]  Fax: [phone]      February 15, 2010 MRN: [id number]   David Maynard [address] [place], [place]  [address]   Dear David Maynard,   According to your medical record, it is time for you to schedule a Colonoscopy. The American Cancer Society recommends this procedure as a method to detect early colon cancer. Patients with a family history of colon cancer, or a personal history of colon polyps or inflammatory bowel disease are at increased risk.  This letter has been generated based on the recommendations made at the time of your procedure. If you feel that in your particular situation this may no longer apply, please contact our office.  Please call our office at [phone] to schedule this appointment or to update your records at your earliest convenience.  Thank you for cooperating with [place] to provide you with the very best care possible.   Sincerely,  Judie Petit T. Russella Dar, M.D.  [facility] [phone]

## 2010-02-23 NOTE — Progress Notes (Signed)
Summary: Lab req  Phone Note Call from Patient Call back at Home Phone [phone]   Caller: Patient [phone] Summary of Call: Pt called stating he was told by SAE that he can call back to have labs done anytime (CPX and PSA). Pt is requesting to have labs done next week but he is not due for CPX until March. Should pt be scheduled for labs now or should he be advised to wait until CPX? Initial call taken by: Margaret Pyle, CMA,  February 02, 2010 11:39 AM  Follow-up for Phone Call        ok for cpx labs (including psa), and a1c 250.00 Follow-up by: Minus Breeding MD,  February 02, 2010 12:32 PM  Additional Follow-up for Phone Call Additional follow up Details #1::        Labs scheduled and pt aware Additional Follow-up by: Margaret Pyle, CMA,  February 02, 2010 3:05 PM

## 2010-02-23 NOTE — Progress Notes (Signed)
Summary: Rx refill req  Phone Note Refill Request Message from:  Patient  Refills Requested: Medication #1:  CIALIS 5 MG TABS 1 once daily   Dosage confirmed as above?Dosage Confirmed   Supply Requested: 1 month pt requesting refill until CPX due in March   Method Requested: Electronic Initial call taken by: Margaret Pyle, CMA,  February 07, 2010 3:19 PM    Prescriptions: CIALIS 5 MG TABS (TADALAFIL) 1 once daily  #30 x 1   Entered by:   Margaret Pyle, CMA   Authorized by:   Minus Breeding MD   Signed by:   Margaret Pyle, CMA on 02/07/2010   Method used:   Electronically to        [facility]  [phone]* (retail)       [address]       [place], [place]  [address]       Ph: [phone] or [phone]       Fax: [phone]   RxID:   [id number]

## 2010-02-23 NOTE — Progress Notes (Signed)
Summary: rx re-sent  Phone Note Call from Patient   Caller: Patient [phone] Summary of Call: pt called stating that his RX's were sent to [facility] on [address] but pt needs Zocor to [facility].Cialis to [facility] [address]. Rx's sent. Initial call taken by: Margaret Pyle, CMA,  March 31, 2009 9:26 AM    Prescriptions: CIALIS 5 MG TABS (TADALAFIL) 1 once daily  #30 x 11   Entered by:   Margaret Pyle, CMA   Authorized by:   Minus Breeding MD   Signed by:   Margaret Pyle, CMA on 03/31/2009   Method used:   Electronically to        [facility]  [phone]* (retail)       [address]       [place], [place]  [address]       Ph: [phone] or [phone]       Fax: [phone]   RxID:   [id number] ZOCOR 80 MG  TABS (SIMVASTATIN) take 1 by mouth qd  #90 x 3   Entered by:   Margaret Pyle, CMA   Authorized by:   Minus Breeding MD   Signed by:   Margaret Pyle, CMA on 03/31/2009   Method used:   Electronically to        [facility]* (mail-order)             ,          Ph: [phone]       Fax: [phone]   RxID:   [id number]

## 2010-02-23 NOTE — Assessment & Plan Note (Signed)
Summary: lump under left armpit/#/cd   Vital Signs:  Patient profile:   60 year old male Height:      67 inches (170.18 cm) Weight:      157 pounds (71.36 kg) BMI:     24.68 O2 Sat:      95 % on Room air Temp:     98.0 degrees F (36.67 degrees C) oral Pulse rate:   73 / minute BP sitting:   116 / 70  (left arm) Cuff size:   regular  Vitals Entered By: Brenton Grills CMA (AAMA) (December 09, 2009 2:00 PM)  O2 Flow:  Room air CC: Lump under left armpit/aj Is Patient Diabetic? Yes   CC:  Lump under left armpit/aj.  History of Present Illness: 1 day of slight pain at the left axilla, and assoc lump there.  Current Medications (verified): 1)  Multivitamins   Tabs (Multiple Vitamin) .... Take 1 By Mouth Qd 2)  Bl Vitamin E 400 Unit  Caps (Vitamin E) .... Take 1 By Mouth Qd 3)  Bl Vitamin C 500 Mg  Tabs (Ascorbic Acid) .... Take 1 By Mouth Qd 4)  Clomiphene Citrate 50 Mg  Tabs (Clomiphene Citrate) .... Take 1/4 Tab Qiw 5)  Zocor 80 Mg  Tabs (Simvastatin) .... Take 1 By Mouth Qd 6)  Januvia 100 Mg Tabs (Sitagliptin Phosphate) .... Take 1 Tablet By Mouth Once A Day 7)  Metformin Hcl 500 Mg Tb24 (Metformin Hcl) .... Take 2 Tablet By Mouth Twice A Day 8)  Synthroid 50 Mcg Tabs (Levothyroxine Sodium) .... Take 1 Tablet By Mouth Once A Day 9)  Fish Oil 1000mg  .... 1400 Mg Once Daily 10)  Cialis 5 Mg Tabs (Tadalafil) .Marland Kitchen.. 1 Once Daily 11)  Doxycycline Hyclate 100 Mg Caps (Doxycycline Hyclate) .Marland Kitchen.. 1 Tab Two Times A Day 12)  Actos 45 Mg Tabs (Pioglitazone Hcl) .Marland Kitchen.. 1 Tab Once Daily  Allergies (verified): No Known Drug Allergies  Past History:  Past Medical History: Last updated: 06/19/2007 DEPRESSIVE DISORDER NOT ELSEWHERE CLASSIFIED (ICD-311) HYPERCHOLESTEROLEMIA (ICD-272.0) HYPOGONADISM, MALE (ICD-257.2) SPECIAL SCREENING MALIGNANT NEOPLASM OF PROSTATE (ICD-V76.44) UNSPECIFIED HYPOTHYROIDISM (ICD-244.9) DIABETES MELLITUS, TYPE II (ICD-250.00)  Review of Systems  The  patient denies fever.    Physical Exam  General:  normal appearance.   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 Patient Level III ([address])  Patient Instructions: 1)  doxycycline 100 mg two times a day 2)  physical is due in 4 months.   Prescriptions: CIALIS 5 MG TABS (TADALAFIL) 1 once daily  #30 x 11   Entered and Authorized by:   Minus Breeding MD   Signed by:   Minus Breeding MD on 12/09/2009   Method used:   Print then Give to Patient   RxID:   [id number] DOXYCYCLINE HYCLATE 100 MG CAPS (DOXYCYCLINE HYCLATE) 1 tab two times a day  #14 x 2   Entered and Authorized by:   Minus Breeding MD   Signed by:   Minus Breeding MD on 12/09/2009   Method used:   Electronically to        [facility] [phone]* (retail)       [address]       [place], [place]  [address]       Ph: [phone]  Fax: [phone]   RxID:   [id number]    Orders Added: 1)  Est. Patient Level III [[address]]

## 2010-02-23 NOTE — Assessment & Plan Note (Signed)
Summary: lump under left arm/#/cd   Vital Signs:  Patient profile:   60 year old male Height:      67 inches (170.18 cm) Weight:      161.13 pounds (73.24 kg) BMI:     25.33 O2 Sat:      96 % on Room air Temp:     98.6 degrees F (37.00 degrees C) oral Pulse rate:   69 / minute BP sitting:   102 / 64  (left arm) Cuff size:   regular  Vitals Entered By: Brenton Grills MA (July 28, 2009 11:29 AM)  O2 Flow:  Room air CC: lump under left arm/pt is taking Vitamin E only 3 times a week and Cialis as needed/aj   CC:  lump under left arm/pt is taking Vitamin E only 3 times a week and Cialis as needed/aj.  History of Present Illness: pt states 9 days of slight nodule at the right axilla, and associated pain.  it has resolved.    Current Medications (verified): 1)  Multivitamins   Tabs (Multiple Vitamin) .... Take 1 By Mouth Qd 2)  Bl Vitamin E 400 Unit  Caps (Vitamin E) .... Take 1 By Mouth Qd 3)  Bl Vitamin C 500 Mg  Tabs (Ascorbic Acid) .... Take 1 By Mouth Qd 4)  Clomiphene Citrate 50 Mg  Tabs (Clomiphene Citrate) .... Take 1/4 Tab Qiw 5)  Zocor 80 Mg  Tabs (Simvastatin) .... Take 1 By Mouth Qd 6)  Januvia 100 Mg Tabs (Sitagliptin Phosphate) .... Take 1 Tablet By Mouth Once A Day 7)  Metformin Hcl 500 Mg Tb24 (Metformin Hcl) .... Take 2 Tablet By Mouth Twice A Day 8)  Synthroid 50 Mcg Tabs (Levothyroxine Sodium) .... Take 1 Tablet By Mouth Once A Day 9)  Actos 15 Mg  Tabs (Pioglitazone Hcl) .... Qd 10)  Fish Oil 1000mg  .... Once Daily 11)  Cialis 5 Mg Tabs (Tadalafil) .Marland Kitchen.. 1 Once Daily  Allergies (verified): No Known Drug Allergies  Past History:  Past Medical History: Last updated: 06/19/2007 DEPRESSIVE DISORDER NOT ELSEWHERE CLASSIFIED (ICD-311) HYPERCHOLESTEROLEMIA (ICD-272.0) HYPOGONADISM, MALE (ICD-257.2) SPECIAL SCREENING MALIGNANT NEOPLASM OF PROSTATE (ICD-V76.44) UNSPECIFIED HYPOTHYROIDISM (ICD-244.9) DIABETES MELLITUS, TYPE II (ICD-250.00)  Review of  Systems  The patient denies fever.    Physical Exam  General:  normal appearance.   Axillary Nodes:  none palpable Additional Exam:  Hemoglobin A1C       [H]  6.8 %   Impression & Recommendations:  Problem # 1:  DIABETES MELLITUS, TYPE II (ICD-250.00) well-controlled  Problem # 2:  probable lymph node resolved  Other Orders: TLB-A1C / Hgb A1C (Glycohemoglobin) (83036-A1C) Est. Patient Level II (04540)  Patient Instructions: 1)  please return if the lump under your arm recurs. 2)  blood tests are being ordered for you today.  please call [phone] to hear your test results. 3)  physical is due march, 2012 4)  (update: i left message on phone-tree:  a1c is good.  same rx)

## 2010-02-23 NOTE — Progress Notes (Signed)
Summary: Cialis prescription  Phone Note Call from Patient Call back at [phone]   Caller: Patient Call For: Minus Breeding MD Summary of Call: Patient called requesting a prescription for Cialis 20mg  and would like a 3 month supply. The patient found a prescription written in Nov.for Cialis 5mg . written by Dr. Everardo All. Initial call taken by: Zella Ball Ewing CMA Duncan Dull),  February 08, 2010 11:33 AM  Follow-up for Phone Call        sent Follow-up by: Minus Breeding MD,  February 08, 2010 12:16 PM    New/Updated Medications: CIALIS 20 MG TABS (TADALAFIL) for as needed use Prescriptions: CIALIS 20 MG TABS (TADALAFIL) for as needed use  #30 x 3   Entered by:   Margaret Pyle, CMA   Authorized by:   Minus Breeding MD   Signed by:   Margaret Pyle, CMA on 02/08/2010   Method used:   Electronically to        [facility]  [phone]* (retail)       [address]       [place], [place]  [address]       Ph: [phone] or [phone]       Fax: [phone]   RxID:   [id number] CIALIS 20 MG TABS (TADALAFIL) for as needed use  #30 x 3   Entered and Authorized by:   Minus Breeding MD   Signed by:   Minus Breeding MD on 02/08/2010   Method used:   Electronically to        [facility] [phone]* (retail)       [address]       [place], [place]  [address]       Ph: [phone]       Fax: [phone]   RxID:   [id number]

## 2010-02-23 NOTE — Letter (Signed)
Summary: [facility]   Imported By: Lester Penton 03/08/2009 08:14:43  _____________________________________________________________________  External Attachment:    Type:   Image     Comment:   External Document

## 2010-02-28 ENCOUNTER — Encounter (INDEPENDENT_AMBULATORY_CARE_PROVIDER_SITE_OTHER): Payer: Self-pay | Admitting: *Deleted

## 2010-03-09 NOTE — Letter (Signed)
Summary: Pre Visit Letter Revised  [facility]  [address] [place], [place] [address]   Phone: [phone]  Fax: [phone]        02/28/2010 MRN: [id number] David Maynard [address] [place], [place]  [address]             Procedure Date:  04-13-10   Welcome to the Gastroenterology Division at [facility].    You are scheduled to see a nurse for your pre-procedure visit on 03-30-10 at 9:00a.m. on the 3rd floor at [facility], [address]  We ask that you try to arrive at our office 15 minutes prior to your appointment time to allow for check-in.  Please take a minute to review the attached form.  If you answer "Yes" to one or more of the questions on the first page, we ask that you call the person listed at your earliest opportunity.  If you answer "No" to all of the questions, please complete the rest of the form and bring it to your appointment.    Your nurse visit will consist of discussing your medical and surgical history, your immediate family medical history, and your medications.   If you are unable to list all of your medications on the form, please bring the medication bottles to your appointment and we will list them.  We will need to be aware of both prescribed and over the counter drugs.  We will need to know exact dosage information as well.    Please be prepared to read and sign documents such as consent forms, a financial agreement, and acknowledgement forms.  If necessary, and with your consent, a friend or relative is welcome to sit-in on the nurse visit with you.  Please bring your insurance card so that we may make a copy of it.  If your insurance requires a referral to see a specialist, please bring your referral form from your primary care physician.  No co-pay is required for this nurse visit.     If you cannot keep your appointment, please call [phone] to cancel or reschedule prior to your appointment date.  This allows [place]  the opportunity to schedule an appointment for another patient in need of care.    Thank you for choosing [facility] for your medical needs.  We appreciate the opportunity to care for you.  Please visit [place] at our website  to learn more about our practice.  Sincerely, The Gastroenterology Division

## 2010-03-14 ENCOUNTER — Telehealth: Payer: Self-pay | Admitting: Endocrinology

## 2010-03-21 NOTE — Progress Notes (Signed)
Summary: rx refill req  Phone Note Refill Request Message from:  Fax from Pharmacy on March 14, 2010 8:52 AM  Refills Requested: Medication #1:  CLOMIPHENE CITRATE 50 MG  TABS take 1/4 tab qiw   Dosage confirmed as above?Dosage Confirmed   Last Refilled: 03/13/2010  Method Requested: Electronic Next Appointment Scheduled: 03/23/2010 Initial call taken by: Brenton Grills CMA Duncan Dull),  March 14, 2010 8:52 AM    Prescriptions: CLOMIPHENE CITRATE 50 MG  TABS (CLOMIPHENE CITRATE) take 1/4 tab qiw  #5 Each x 2   Entered by:   Brenton Grills CMA (AAMA)   Authorized by:   Minus Breeding MD   Signed by:   Brenton Grills CMA (AAMA) on 03/14/2010   Method used:   Electronically to        [facility] [address] (retail)       [phone]       [place], [place]  [address]       Ph: [phone]       Fax: [phone]   RxID:   [id number]

## 2010-03-23 ENCOUNTER — Other Ambulatory Visit: Payer: 59

## 2010-03-23 ENCOUNTER — Encounter: Payer: Self-pay | Admitting: Endocrinology

## 2010-03-23 ENCOUNTER — Other Ambulatory Visit: Payer: Self-pay | Admitting: Endocrinology

## 2010-03-23 ENCOUNTER — Ambulatory Visit (INDEPENDENT_AMBULATORY_CARE_PROVIDER_SITE_OTHER): Payer: 59 | Admitting: Endocrinology

## 2010-03-23 DIAGNOSIS — E119 Type 2 diabetes mellitus without complications: Secondary | ICD-10-CM

## 2010-03-23 DIAGNOSIS — E291 Testicular hypofunction: Secondary | ICD-10-CM

## 2010-03-23 LAB — CONVERTED CEMR LAB: Fructosamine: 272 micromoles/L (ref <285)

## 2010-03-28 ENCOUNTER — Encounter (INDEPENDENT_AMBULATORY_CARE_PROVIDER_SITE_OTHER): Payer: Self-pay | Admitting: *Deleted

## 2010-03-30 ENCOUNTER — Encounter (INDEPENDENT_AMBULATORY_CARE_PROVIDER_SITE_OTHER): Payer: Self-pay | Admitting: *Deleted

## 2010-03-30 ENCOUNTER — Encounter: Payer: Self-pay | Admitting: *Deleted

## 2010-03-30 ENCOUNTER — Encounter: Payer: Self-pay | Admitting: Gastroenterology

## 2010-03-30 DIAGNOSIS — E1165 Type 2 diabetes mellitus with hyperglycemia: Secondary | ICD-10-CM | POA: Insufficient documentation

## 2010-03-30 DIAGNOSIS — E119 Type 2 diabetes mellitus without complications: Secondary | ICD-10-CM | POA: Insufficient documentation

## 2010-04-04 NOTE — Miscellaneous (Signed)
Summary: [facility] Previsit/prep  Clinical Lists Changes  Medications: Added new medication of MOVIPREP 100 GM  SOLR (PEG-KCL-NACL-NASULF-NA ASC-C) As per prep instructions. - Signed Rx of MOVIPREP 100 GM  SOLR (PEG-KCL-NACL-NASULF-NA ASC-C) As per prep instructions.;  #1 x 0;  Signed;  Entered by: Wyona Almas RN;  Authorized by: Meryl Dare MD [facility];  Method used: Electronically to [facility]. #[id number]*, [address] [place], [place], [place]  [address], Ph: [phone], Fax: [phone] Observations: Added new observation of NKA: T (03/30/2010 8:47)    Prescriptions: MOVIPREP 100 GM  SOLR (PEG-KCL-NACL-NASULF-NA ASC-C) As per prep instructions.  #1 x 0   Entered by:   Wyona Almas RN   Authorized by:   Meryl Dare MD [facility]   Signed by:   Wyona Almas RN on 03/30/2010   Method used:   Electronically to        [facility]. [phone]* (retail)       [address]       [place], [place]  [address]       Ph: [phone]       Fax: [phone]   RxID:   [id number]

## 2010-04-04 NOTE — Letter (Signed)
Summary: Diabetic Instructions  [facility]  [address] [place], [place] [address]   Phone: [phone]  Fax: [phone]    David Maynard Jan 17, 1951 MRN: [id number]   _x  _   ORAL DIABETIC MEDICATION INSTRUCTIONS  The day before your procedure:   Take your diabetic pill as you do normally  The day of your procedure:   Do not take your diabetic pill    We will check your blood sugar levels during the admission process and again in Recovery before discharging you home  ________________________________________________________________________

## 2010-04-04 NOTE — Letter (Signed)
Summary: [facility] Instructions  [facility]  [address] [place], [place] [address]   Phone: [phone]  Fax: [phone]       David Maynard    April 09, 1950    MRN: [id number]        Procedure Day /Date: Thursday 04/13/2010     Arrival Time: 7:30AM     Procedure Time: 8:30AM     Location of Procedure:                    _X_  [facility] (4th Floor)                       PREPARATION FOR COLONOSCOPY WITH MOVIPREP   Starting 5 days prior to your procedure 04/08/10 do not eat nuts, seeds, popcorn, corn, beans, peas,  salads, or any raw vegetables.  Do not take any fiber supplements (e.g. Metamucil, Citrucel, and Benefiber).  THE DAY BEFORE YOUR PROCEDURE         DATE: 3/21   DAY: Wed.  1.  Drink clear liquids the entire day-NO SOLID FOOD  2.  Do not drink anything colored red or purple.  Avoid juices with pulp.  No orange juice.  3.  Drink at least 64 oz. (8 glasses) of fluid/clear liquids during the day to prevent dehydration and help the prep work efficiently.  CLEAR LIQUIDS INCLUDE: Water Jello Ice Popsicles Tea (sugar ok, no milk/cream) Powdered fruit flavored drinks Coffee (sugar ok, no milk/cream) Gatorade Juice: apple, white grape, white cranberry  Lemonade Clear bullion, consomm, broth Carbonated beverages (any kind) Strained chicken noodle soup Hard Candy                             4.  In the morning, mix first dose of MoviPrep solution:    Empty 1 Pouch A and 1 Pouch B into the disposable container    Add lukewarm drinking water to the top line of the container. Mix to dissolve    Refrigerate (mixed solution should be used within 24 hrs)  5.  Begin drinking the prep at 5:00 p.m. The MoviPrep container is divided by 4 marks.   Every 15 minutes drink the solution down to the next mark (approximately 8 oz) until the full liter is complete.   6.  Follow completed prep with 16 oz of clear liquid of your choice (Nothing red or  purple).  Continue to drink clear liquids until bedtime.  7.  Before going to bed, mix second dose of MoviPrep solution:    Empty 1 Pouch A and 1 Pouch B into the disposable container    Add lukewarm drinking water to the top line of the container. Mix to dissolve    Refrigerate  THE DAY OF YOUR PROCEDURE      DATE: 3/22   DAY: Magdalene Molly.  Beginning at 3:30AM (5 hours before procedure):         1. Every 15 minutes, drink the solution down to the next mark (approx 8 oz) until the full liter is complete.  2. Follow completed prep with 16 oz. of clear liquid of your choice.    3. You may drink clear liquids until 6:30AM (2 HOURS BEFORE PROCEDURE).   MEDICATION INSTRUCTIONS  Unless otherwise instructed, you should take regular prescription medications with a small sip of water   as early as possible the morning of your procedure.  Diabetic patients - see separate instructions.         OTHER INSTRUCTIONS  You will need a responsible adult at least 60 years of age to accompany you and drive you home.   This person must remain in the waiting room during your procedure.  Wear loose fitting clothing that is easily removed.  Leave jewelry and other valuables at home.  However, you may wish to bring a book to read or  an iPod/MP3 player to listen to music as you wait for your procedure to start.  Remove all body piercing jewelry and leave at home.  Total time from sign-in until discharge is approximately 2-3 hours.  You should go home directly after your procedure and rest.  You can resume normal activities the  day after your procedure.  The day of your procedure you should not:   Drive   Make legal decisions   Operate machinery   Drink alcohol   Return to work  You will receive specific instructions about eating, activities and medications before you leave.    The above instructions have been reviewed and explained to me by   Wyona Almas RN  March 30, 2010 9:34  AM     I fully understand and can verbalize these instructions _____________________________ Date _________

## 2010-04-04 NOTE — Assessment & Plan Note (Signed)
Summary: fu.lb   Vital Signs:  Patient profile:   60 year old male Height:      67 inches (170.18 cm) Weight:      154.75 pounds (70.34 kg) BMI:     24.32 O2 Sat:      98 % on Room air Temp:     98.6 degrees F (37.00 degrees C) oral Pulse rate:   74 / minute Pulse rhythm:   regular BP sitting:   118 / 70  (left arm) Cuff size:   regular  Vitals Entered By: Brenton Grills CMA Duncan Dull) (March 23, 2010 10:25 AM)  O2 Flow:  Room air CC: Follow-up visit/aj Is Patient Diabetic? Yes   CC:  Follow-up visit/aj.  History of Present Illness: pt says he has lost weight due to the demands of a new job.  he now works part-time.  he says weight-loss has helped his dm control.  Current Medications (verified): 1)  Multivitamins   Tabs (Multiple Vitamin) .... Take 1 By Mouth Qd 2)  Bl Vitamin E 400 Unit  Caps (Vitamin E) .... Take 1 By Mouth Qd 3)  Bl Vitamin C 500 Mg  Tabs (Ascorbic Acid) .... Take 1 By Mouth Qd 4)  Clomiphene Citrate 50 Mg  Tabs (Clomiphene Citrate) .... Take 1/4 Tab Qiw 5)  Zocor 80 Mg  Tabs (Simvastatin) .... Take 1 By Mouth Qd 6)  Januvia 100 Mg Tabs (Sitagliptin Phosphate) .... Take 1 Tablet By Mouth Once A Day 7)  Metformin Hcl 500 Mg Tb24 (Metformin Hcl) .... Take 2 Tablet By Mouth Twice A Day 8)  Synthroid 50 Mcg Tabs (Levothyroxine Sodium) .... Take 1 Tablet By Mouth Once A Day 9)  Fish Oil 1000mg  .... 1400 Mg Once Daily 10)  Doxycycline Hyclate 100 Mg Caps (Doxycycline Hyclate) .Marland Kitchen.. 1 Tab Two Times A Day 11)  Actos 45 Mg Tabs (Pioglitazone Hcl) .Marland Kitchen.. 1 Tab Once Daily 12)  Cialis 20 Mg Tabs (Tadalafil) .... For As Needed Use  Allergies (verified): No Known Drug Allergies  Past History:  Past Medical History: Last updated: 06/19/2007 DEPRESSIVE DISORDER NOT ELSEWHERE CLASSIFIED (ICD-311) HYPERCHOLESTEROLEMIA (ICD-272.0) HYPOGONADISM, MALE (ICD-257.2) SPECIAL SCREENING MALIGNANT NEOPLASM OF PROSTATE (ICD-V76.44) UNSPECIFIED HYPOTHYROIDISM (ICD-244.9) DIABETES  MELLITUS, TYPE II (ICD-250.00)  Social History: Reviewed history from 03/18/2008 and no changes required. Never Smoked Alcohol use-yes  Review of Systems  The patient denies chest pain and dyspnea on exertion.    Physical Exam  General:  normal appearance.   Pulses:  dorsalis pedis intact bilat.    Extremities:  no deformity.  no ulcer on the feet.  feet are of normal color and temp.   1+ right pedal edema and 1+ left pedal edema.   Neurologic:  sensation is intact to touch on the feet  Additional Exam:  Fructosamine              272 umol/L   (converts to a1c of 6.2)   Impression & Recommendations:  Problem # 1:  DIABETES MELLITUS, TYPE II (ICD-250.00) well-controlled  Other Orders: T-Fructosamine (69629-52841) TLB-Testosterone, Total (84403-TESTO) Est. Patient Level III (32440)  Patient Instructions: 1)  blood tests are being ordered for you today.  please call [phone] to hear your test results. 2)  pending the test results, please continue the same medications for now. 3)  Please schedule a regular physical appointment in 3 months.  4)  (update: i left message on phone-tree:  rx as we discussed) Prescriptions: CLOMIPHENE CITRATE 50 MG  TABS (  CLOMIPHENE CITRATE) take 1/4 tab qiw  #15 x 6   Entered and Authorized by:   Minus Breeding MD   Signed by:   Minus Breeding MD on 03/23/2010   Method used:   Electronically to        [facility]. [phone]* (retail)       [address]       [place], [place]  [address]       Ph: [phone]       Fax: [phone]   RxID:   [id number] CIALIS 20 MG TABS (TADALAFIL) for as needed use  #10 x 3   Entered and Authorized by:   Minus Breeding MD   Signed by:   Minus Breeding MD on 03/23/2010   Method used:   Electronically to        [facility]* (retail)             ,          Ph: [phone]       Fax: [phone]   RxID:   [id number]    Orders Added: 1)  T-Fructosamine  [phone] 2)  TLB-Testosterone, Total [84403-TESTO] 3)  Est. Patient Level III [16010]   Immunization History:  Influenza Immunization History:    Influenza:  historical (11/22/2009)   Immunization History:  Influenza Immunization History:    Influenza:  Historical (11/22/2009)

## 2010-04-13 ENCOUNTER — Encounter: Payer: Self-pay | Admitting: Gastroenterology

## 2010-04-13 ENCOUNTER — Ambulatory Visit (AMBULATORY_SURGERY_CENTER): Payer: 59 | Admitting: Gastroenterology

## 2010-04-13 VITALS — BP 122/79 | HR 76 | Temp 98.4°F | Resp 24 | Ht 68.0 in | Wt 155.0 lb

## 2010-04-13 DIAGNOSIS — Z1211 Encounter for screening for malignant neoplasm of colon: Secondary | ICD-10-CM

## 2010-04-13 DIAGNOSIS — K5289 Other specified noninfective gastroenteritis and colitis: Secondary | ICD-10-CM

## 2010-04-13 DIAGNOSIS — R933 Abnormal findings on diagnostic imaging of other parts of digestive tract: Secondary | ICD-10-CM

## 2010-04-13 LAB — HM COLONOSCOPY

## 2010-04-13 NOTE — Patient Instructions (Signed)
Please follow discharge instructions given to [facility].

## 2010-04-14 ENCOUNTER — Telehealth: Payer: Self-pay | Admitting: *Deleted

## 2010-04-14 NOTE — Telephone Encounter (Signed)
No message left / no ID on answering machine

## 2010-04-18 ENCOUNTER — Encounter: Payer: Self-pay | Admitting: Gastroenterology

## 2010-04-20 NOTE — Procedures (Addendum)
Summary: Colonoscopy  Patient: [name] Note: All result statuses are Final unless otherwise noted.  Tests: (1) Colonoscopy (COL)   COL Colonoscopy           DONE     [facility]     [address]     [place], [place]  [address]          COLONOSCOPY PROCEDURE REPORT          PATIENT:  David Maynard, David Maynard  MR#:  [id number]     BIRTHDATE:  13-May-1950, 59 yrs. old  GENDER:  male     ENDOSCOPIST:  Judie Petit T. Russella Dar, MD, [facility]          PROCEDURE DATE:  04/13/2010     PROCEDURE:  Colonoscopy with biopsy     ASA CLASS:  Class II     INDICATIONS:  1) Routine Risk Screening     MEDICATIONS:   Fentanyl 75 mcg IV, Versed 8 mg IV     DESCRIPTION OF PROCEDURE:   After the risks benefits and     alternatives of the procedure were thoroughly explained, informed     consent was obtained.  Digital rectal exam was performed and     revealed no abnormalities.   The LB [id number]-[id number] endoscope     was introduced through the anus and advanced to the cecum, which     was identified by both the appendix and ileocecal valve, limited     by a tortuous colon.  The quality of the prep was excellent, using     MoviPrep.  The instrument was then slowly withdrawn as the colon     was fully examined.     <<PROCEDUREIMAGES>>          FINDINGS:  Prominent fold/nodule on IC valve, measuring 5 mm.     Multiple biopsies were obtained and sent to pathology. A normal     appearing cecum, and appendiceal orifice were identified. The     ascending, hepatic flexure, transverse, splenic flexure,     descending, sigmoid colon, and rectum appeared unremarkable.     Retroflexed views in the rectum revealed no abnormalities. The time     to cecum =  5.67  minutes. The scope was then withdrawn (time =     11.5  min) from the patient and the procedure completed.          COMPLICATIONS:  None          ENDOSCOPIC IMPRESSION:     1) Prominent fold/nodule on the ileocecal valve     2) Normal colon         RECOMMENDATIONS:     1) Await pathology results     2) Timing of repeat colonoscopy will be determined by pathology     findings.          Venita Lick. Russella Dar, MD, [facility] Graham          CC:  Minus Breeding, MD          n.     [name] at 04/13/2010 09:04 AM          Charlyne Mom, [phone]  Note: An exclamation mark (!) indicates a result that was not dispersed into the flowsheet. Document Creation Date: 04/19/2010 11:38 AM _______________________________________________________________________  (1) Order result status: Final Collection or observation date-time: 04/13/2010 08:59 Requested date-time:  Receipt date-time:  Reported date-time:  Referring Physician:   Ordering Physician: Judie Petit  Russella Dar [phone]) Specimen Source:  Source: Launa Grill Order Number: [id number] Lab site:

## 2010-04-24 ENCOUNTER — Other Ambulatory Visit: Payer: Self-pay

## 2010-04-24 MED ORDER — SIMVASTATIN 80 MG PO TABS: 80.0000 mg | ORAL_TABLET | Freq: Every day | ORAL | Status: DC

## 2010-05-15 ENCOUNTER — Other Ambulatory Visit: Payer: Self-pay | Admitting: Endocrinology

## 2010-05-16 ENCOUNTER — Other Ambulatory Visit: Payer: Self-pay | Admitting: Endocrinology

## 2010-08-04 ENCOUNTER — Other Ambulatory Visit: Payer: Self-pay

## 2010-08-04 MED ORDER — SITAGLIPTIN PHOSPHATE 100 MG PO TABS: 100.0000 mg | ORAL_TABLET | Freq: Every day | ORAL | Status: DC

## 2010-09-18 ENCOUNTER — Telehealth: Payer: Self-pay

## 2010-09-18 NOTE — Telephone Encounter (Signed)
Pt called requesting to have labs test - similar to A1x but check only 1 month of blood sugars - Fructosamine? Please advise

## 2010-09-18 NOTE — Telephone Encounter (Signed)
Pt advised and transferred to schedule appt.  

## 2010-09-18 NOTE — Telephone Encounter (Signed)
Ov is due.  Let's address then 

## 2010-09-22 ENCOUNTER — Ambulatory Visit (INDEPENDENT_AMBULATORY_CARE_PROVIDER_SITE_OTHER): Payer: 59 | Admitting: Endocrinology

## 2010-09-22 ENCOUNTER — Encounter: Payer: Self-pay | Admitting: Endocrinology

## 2010-09-22 ENCOUNTER — Other Ambulatory Visit (INDEPENDENT_AMBULATORY_CARE_PROVIDER_SITE_OTHER): Payer: 59

## 2010-09-22 DIAGNOSIS — M519 Unspecified thoracic, thoracolumbar and lumbosacral intervertebral disc disorder: Secondary | ICD-10-CM | POA: Insufficient documentation

## 2010-09-22 DIAGNOSIS — E119 Type 2 diabetes mellitus without complications: Secondary | ICD-10-CM

## 2010-09-22 NOTE — Progress Notes (Signed)
Subjective:    Patient ID: David Maynard, male    DOB: 14-Nov-1950, 60 y.o.   MRN: [id number]  HPI Pt sees dr Danielle Dess for low-back pain.  celebrex helps.  no cbg record, but states cbg's are well-controlled, except for a steroid shot 3 mos ago.   Past Medical History  Diagnosis Date  . Diabetes mellitus type II   . Hypercholesterolemia   . Thyroid disease   . Depressive disorder, not elsewhere classified   . Hypogonadism male   . Unspecified hypothyroidism     Past Surgical History  Procedure Date  . Rotator cuff repair     Right  . Knee arthroscopy     Left  . Knee arthroscopy w/ acl reconstruction     left  . Inguinal hernia repair     left  . Vasectomy 05/2002  . Esophagogastroduodenoscopy 06/16/2003  . Stress cardiolite 07/15/2000    History   Social History  . Marital Status: Married    Spouse Name: N/A    Number of Children: N/A  . Years of Education: N/A   Occupational History  . Not on file.   Social History Main Topics  . Smoking status: Never Smoker   . Smokeless tobacco: Not on file  . Alcohol Use: 0.6 oz/week    1 Cans of beer per week  . Drug Use: No  . Sexually Active: Not on file   Other Topics Concern  . Not on file   Social History Narrative  . No narrative on file    Current Outpatient Prescriptions on File Prior to Visit  Medication Sig Dispense Refill  . Ascorbic Acid (VITAMIN C) 500 MG tablet Take 500 mg by mouth daily.        . clomiPHENE (CLOMID) 50 MG tablet Take 50 mg by mouth. 1/4 Tablet 4  Times a week       . fish oil-omega-3 fatty acids 1000 MG capsule Take 1 g by mouth daily.        Marland Kitchen glucosamine-chondroitin 500-400 MG tablet Take 1 tablet by mouth 2 (two) times daily.        Marland Kitchen levothyroxine (SYNTHROID, LEVOTHROID) 50 MCG tablet TAKE 1 TABLET ONCE A DAY  90 tablet  1  . metFORMIN (GLUCOPHAGE-XR) 500 MG 24 hr tablet TAKE 2 TABLETS TWICE A DAY  360 tablet  1  . Multiple Vitamin (MULTIVITAMIN) capsule Take 1 capsule by mouth  daily.        . pioglitazone (ACTOS) 45 MG tablet Take 45 mg by mouth daily.        . simvastatin (ZOCOR) 80 MG tablet Take 1 tablet (80 mg total) by mouth at bedtime.  90 tablet  1  . sitaGLIPtin (JANUVIA) 100 MG tablet Take 1 tablet (100 mg total) by mouth daily.  90 tablet  1  . tadalafil (CIALIS) 20 MG tablet Take 20 mg by mouth daily as needed. Erectile dysfunction       . vitamin E 400 UNIT capsule Take 400 Units by mouth daily.          No Known Allergies  Family History  Problem Relation Age of Onset  . Breast cancer Neg Hx   . Celiac disease Neg Hx   . Cirrhosis Neg Hx   . Clotting disorder Neg Hx   . Colitis Neg Hx   . Colon cancer Neg Hx   . Colon polyps Neg Hx   . Crohn's disease Neg Hx   . Cystic  fibrosis Neg Hx   . Diabetes Neg Hx   . Esophageal cancer Neg Hx   . Heart disease Neg Hx   . Hemochromatosis Neg Hx   . Inflammatory bowel disease Neg Hx   . Irritable bowel syndrome Neg Hx   . Kidney disease Neg Hx   . Liver cancer Neg Hx   . Liver disease Neg Hx   . Ovarian cancer Neg Hx   . Pancreatic cancer Neg Hx   . Prostate cancer Neg Hx   . Rectal cancer Neg Hx   . Stomach cancer Neg Hx   . Ulcerative colitis Neg Hx   . Uterine cancer Neg Hx   . Wilson's disease Neg Hx     BP 122/72  Pulse 78  Temp(Src) 98.5 F (36.9 C) (Oral)  Ht 5\' 8"  (1.727 m)  Wt 154 lb (69.854 kg)  BMI 23.42 kg/m2  SpO2 98%  Review of Systems He has lost a few lbs, due to his efforts.    Objective:   Physical Exam Pulses: dorsalis pedis intact bilat.   Feet: no deformity.  no ulcer on the feet.  feet are of normal color and temp.  no edema Neuro: sensation is intact to touch on the feet Lab Results  Component Value Date   HGBA1C 7.1* 09/22/2010      Assessment & Plan:  Dm, needs increased rx (i demonstrated victoza pen)

## 2010-09-22 NOTE — Patient Instructions (Addendum)
good diet and exercise habits significanly improve the control of your diabetes.  please let me know if you wish to be referred to a dietician.  high blood sugar is very risky to your health.  you should see an eye doctor every year. controlling your blood pressure and cholesterol drastically reduces the damage diabetes does to your body.  this also applies to quitting smoking.  please discuss these with your doctor.  you should take an aspirin every day, unless you have been advised by a doctor not to. check your blood sugar 1 time a day.  vary the time of day when you check, between before the 3 meals, and at bedtime.  also check if you have symptoms of your blood sugar being too high or too low.  please keep a record of the readings and bring it to your next appointment here.  please call us sooner if you are having low blood sugar episodes. blood tests are being requested for you today.  please call [phone] to hear your test results.  You will be prompted to enter the 9-digit "MRN" number that appears at the top left of this page, followed by #.  Then you will hear the message. (update: i left message on phone-tree:  Change januvia to victoza--titrate up to 1.8 mg qd.  Call back if you agree.  Ret 3 mos)

## 2010-09-26 ENCOUNTER — Other Ambulatory Visit: Payer: 59

## 2010-09-26 ENCOUNTER — Telehealth: Payer: Self-pay

## 2010-09-26 DIAGNOSIS — E119 Type 2 diabetes mellitus without complications: Secondary | ICD-10-CM

## 2010-09-26 NOTE — Telephone Encounter (Signed)
Given how long ago the injection was, it would have little if any effect on your a1c

## 2010-09-26 NOTE — Telephone Encounter (Signed)
Pt called stating he has a steroid infusion done in May on his back and is concerned that this may have caused recent increase A1C, please advise?

## 2010-09-26 NOTE — Telephone Encounter (Signed)
Pt is requesting to have blood sugar check (a one month test?). Pt believes that steroid infusion is the cause of elevated A1C

## 2010-09-26 NOTE — Telephone Encounter (Signed)
Pt advised via VM 

## 2010-09-26 NOTE — Telephone Encounter (Signed)
i ordered. After having the blood test, please call [phone] to hear your test results.  You will be prompted to enter the 9-digit "MRN" number that appears at the top left of this page, followed by #.  Then you will hear the message.

## 2010-09-28 ENCOUNTER — Ambulatory Visit: Payer: 59 | Admitting: Endocrinology

## 2010-10-02 ENCOUNTER — Telehealth: Payer: Self-pay

## 2010-10-02 DIAGNOSIS — E119 Type 2 diabetes mellitus without complications: Secondary | ICD-10-CM

## 2010-10-02 NOTE — Telephone Encounter (Signed)
Pt called requesting referral to a Nutritionist as discussed at last OV

## 2010-10-02 NOTE — Telephone Encounter (Signed)
Done

## 2010-10-09 ENCOUNTER — Telehealth: Payer: Self-pay | Admitting: *Deleted

## 2010-10-09 NOTE — Telephone Encounter (Signed)
Pt called regarding status of referral for a dietician. Left message for pt stating that PCC's will call once they receive appt date and time information.

## 2010-10-26 ENCOUNTER — Other Ambulatory Visit: Payer: Self-pay | Admitting: Endocrinology

## 2010-10-27 ENCOUNTER — Other Ambulatory Visit: Payer: Self-pay | Admitting: *Deleted

## 2010-10-27 MED ORDER — PIOGLITAZONE HCL 45 MG PO TABS: 45.0000 mg | ORAL_TABLET | Freq: Every day | ORAL | Status: DC

## 2010-10-27 NOTE — Telephone Encounter (Signed)
R'cd fax from [facility] for refill of Actos  Last OV-09/22/2010

## 2010-12-08 ENCOUNTER — Telehealth: Payer: Self-pay

## 2010-12-08 DIAGNOSIS — E119 Type 2 diabetes mellitus without complications: Secondary | ICD-10-CM

## 2010-12-08 NOTE — Telephone Encounter (Signed)
Pt called requesting an order for A1C and fructosamine to be done on Monday. Pt says that he has been eating much better and is anxious to see if his number improve.

## 2010-12-10 NOTE — Telephone Encounter (Signed)
i ordered

## 2010-12-11 ENCOUNTER — Other Ambulatory Visit (INDEPENDENT_AMBULATORY_CARE_PROVIDER_SITE_OTHER): Payer: 59

## 2010-12-11 DIAGNOSIS — E119 Type 2 diabetes mellitus without complications: Secondary | ICD-10-CM

## 2010-12-11 NOTE — Telephone Encounter (Signed)
Pt informed

## 2010-12-21 ENCOUNTER — Telehealth: Payer: Self-pay | Admitting: *Deleted

## 2010-12-21 NOTE — Telephone Encounter (Signed)
Pt wants to know the results of his Fructosamine lab work he recently had done and how it converts to A1c.

## 2010-12-21 NOTE — Telephone Encounter (Signed)
This converts to an a1c of 6.0%.  You should consider this along with the a1c.  i would still take the victoza if i was you, but it is up to you.

## 2010-12-21 NOTE — Telephone Encounter (Signed)
Left message for pt to callback office.  

## 2010-12-21 NOTE — Telephone Encounter (Signed)
Pt advised and made no comment regarding victoza.

## 2011-01-02 ENCOUNTER — Encounter: Payer: Self-pay | Admitting: Endocrinology

## 2011-01-02 ENCOUNTER — Ambulatory Visit (INDEPENDENT_AMBULATORY_CARE_PROVIDER_SITE_OTHER): Payer: 59 | Admitting: Endocrinology

## 2011-01-02 VITALS — BP 124/76 | HR 75 | Temp 98.2°F | Ht 68.0 in | Wt 154.6 lb

## 2011-01-02 DIAGNOSIS — E119 Type 2 diabetes mellitus without complications: Secondary | ICD-10-CM

## 2011-01-02 MED ORDER — SAXAGLIPTIN HCL 5 MG PO TABS: 5.0000 mg | ORAL_TABLET | Freq: Every day | ORAL | Status: DC

## 2011-01-02 NOTE — Patient Instructions (Addendum)
Please call if blood sugar goes over 200 after the steroid shot, so i can prescribe an additional diabetes pill "glimepiride).   Here is a prescription to change januvia to "onglyza." Please let me know if you want to change onglyza to victoza.   Please come back for a follow-up appointment in 3 months.

## 2011-01-02 NOTE — Progress Notes (Signed)
Subjective:    Patient ID: David Maynard, male    DOB: 1950-09-15, 60 y.o.   MRN: [id number]  HPI Pt states few years of moderate pain at the lower back, worse in the context of playing tennis.  celebrex helps.  He takes celebrex approx 3x/week.  He is considering another steroid injection, and wonders if he needs to take an additional DM med for a few days after the injection.   Past Medical History  Diagnosis Date  . Diabetes mellitus type II   . Hypercholesterolemia   . Thyroid disease   . Depressive disorder, not elsewhere classified   . Hypogonadism male   . Unspecified hypothyroidism     Past Surgical History  Procedure Date  . Rotator cuff repair     Right  . Knee arthroscopy     Left  . Knee arthroscopy w/ acl reconstruction     left  . Inguinal hernia repair     left  . Vasectomy 05/2002  . Esophagogastroduodenoscopy 06/16/2003  . Stress cardiolite 07/15/2000    History   Social History  . Marital Status: Married    Spouse Name: N/A    Number of Children: N/A  . Years of Education: N/A   Occupational History  . Not on file.   Social History Main Topics  . Smoking status: Never Smoker   . Smokeless tobacco: Not on file  . Alcohol Use: 0.6 oz/week    1 Cans of beer per week  . Drug Use: No  . Sexually Active: Not on file   Other Topics Concern  . Not on file   Social History Narrative  . No narrative on file    Current Outpatient Prescriptions on File Prior to Visit  Medication Sig Dispense Refill  . Ascorbic Acid (VITAMIN C) 500 MG tablet Take 500 mg by mouth daily.        . celecoxib (CELEBREX) 200 MG capsule Take 200 mg by mouth as needed.        . clomiPHENE (CLOMID) 50 MG tablet Take 50 mg by mouth. 1/4 Tablet 4  Times a week       . fish oil-omega-3 fatty acids 1000 MG capsule Take 1 g by mouth daily.        Marland Kitchen glucosamine-chondroitin 500-400 MG tablet Take 1 tablet by mouth 2 (two) times daily.        Marland Kitchen levothyroxine (SYNTHROID,  LEVOTHROID) 50 MCG tablet TAKE 1 TABLET ONCE A DAY  90 tablet  1  . metFORMIN (GLUCOPHAGE-XR) 500 MG 24 hr tablet TAKE 2 TABLETS TWICE A DAY  360 tablet  1  . Multiple Vitamin (MULTIVITAMIN) capsule Take 1 capsule by mouth daily.        . pioglitazone (ACTOS) 45 MG tablet Take 1 tablet (45 mg total) by mouth daily.  90 tablet  1  . simvastatin (ZOCOR) 80 MG tablet TAKE 1 TABLET AT BEDTIME  90 tablet  1  . tadalafil (CIALIS) 20 MG tablet Take 20 mg by mouth daily as needed. Erectile dysfunction       . vitamin D, CHOLECALCIFEROL, 400 UNITS tablet Take 400 Units by mouth 2 (two) times daily.        . vitamin E 400 UNIT capsule Take 400 Units by mouth daily.          No Known Allergies  Family History  Problem Relation Age of Onset  . Breast cancer Neg Hx   . Celiac disease Neg Hx   .  Cirrhosis Neg Hx   . Clotting disorder Neg Hx   . Colitis Neg Hx   . Colon cancer Neg Hx   . Colon polyps Neg Hx   . Crohn's disease Neg Hx   . Cystic fibrosis Neg Hx   . Diabetes Neg Hx   . Esophageal cancer Neg Hx   . Heart disease Neg Hx   . Hemochromatosis Neg Hx   . Inflammatory bowel disease Neg Hx   . Irritable bowel syndrome Neg Hx   . Kidney disease Neg Hx   . Liver cancer Neg Hx   . Liver disease Neg Hx   . Ovarian cancer Neg Hx   . Pancreatic cancer Neg Hx   . Prostate cancer Neg Hx   . Rectal cancer Neg Hx   . Stomach cancer Neg Hx   . Ulcerative colitis Neg Hx   . Uterine cancer Neg Hx   . Wilson's disease Neg Hx     BP 124/76  Pulse 75  Temp(Src) 98.2 F (36.8 C) (Oral)  Ht 5\' 8"  (1.727 m)  Wt 154 lb 9.6 oz (70.126 kg)  BMI 23.51 kg/m2  SpO2 98%    Review of Systems No weight change or numbness    Objective:   Physical Exam VITAL SIGNS:  See vs page GENERAL: no distress PSYCH: Alert and oriented x 3.  Does not appear anxious nor depressed.   Lab Results  Component Value Date   HGBA1C 6.9* 12/11/2010      Assessment & Plan:  DM, Needs increased rx, if it can  be done with a regimen that avoids or minimizes hypoglycemia. Low-back pain.  Steroids could exac DM.  We discussed x 20 mins, in 30-minute ov.

## 2011-01-03 ENCOUNTER — Ambulatory Visit: Payer: 59 | Admitting: Endocrinology

## 2011-01-24 ENCOUNTER — Telehealth: Payer: Self-pay

## 2011-01-24 DIAGNOSIS — E119 Type 2 diabetes mellitus without complications: Secondary | ICD-10-CM

## 2011-01-24 NOTE — Telephone Encounter (Signed)
Pt called requesting to have his A1C and Frutosamine checked again. Pt is aware that both labs were last checked 12/11/2010

## 2011-01-26 NOTE — Telephone Encounter (Signed)
Pt informed

## 2011-01-26 NOTE — Telephone Encounter (Signed)
i ordered fructosamine.  Ins won't pay for the a1c until 3 mos from the last

## 2011-01-29 ENCOUNTER — Other Ambulatory Visit: Payer: Self-pay | Admitting: *Deleted

## 2011-01-29 ENCOUNTER — Other Ambulatory Visit: Payer: 59

## 2011-01-29 DIAGNOSIS — E119 Type 2 diabetes mellitus without complications: Secondary | ICD-10-CM

## 2011-02-23 ENCOUNTER — Ambulatory Visit: Payer: 59 | Admitting: Endocrinology

## 2011-02-26 ENCOUNTER — Ambulatory Visit (INDEPENDENT_AMBULATORY_CARE_PROVIDER_SITE_OTHER): Payer: 59 | Admitting: Endocrinology

## 2011-02-26 ENCOUNTER — Encounter: Payer: Self-pay | Admitting: Endocrinology

## 2011-02-26 VITALS — BP 124/80 | HR 80 | Temp 98.2°F | Ht 68.0 in | Wt 150.0 lb

## 2011-02-26 DIAGNOSIS — E119 Type 2 diabetes mellitus without complications: Secondary | ICD-10-CM

## 2011-02-26 NOTE — Patient Instructions (Addendum)
You should take "victoza" pen, once a day.  The side-effect is nausea, which goes away with time.  To avoid this side-effect, start with the lowest (0.6) setting.  After a few days, increase to 1.2.  If you still have little or no nausea, increase to the highest (1.8) setting, and continue that setting.  Here is a discount card.  This medication replaces the "onglyza."   Here are some samples.  Call when the supply gets low, so we can send a prescription.   Please come back for a regular physical appointment in 3 months.

## 2011-02-26 NOTE — Progress Notes (Signed)
Subjective:    Patient ID: David Maynard, male    DOB: [birth year], 61 y.o.   MRN: [id number]  HPI Pt returns for f/u of type 2 DM (2007).  He takes 3 orals as rx'ed.  pt states he feels well in general. Past Medical History  Diagnosis Date  . Diabetes mellitus type II   . Hypercholesterolemia   . Thyroid disease   . Depressive disorder, not elsewhere classified   . Hypogonadism male   . Unspecified hypothyroidism     Past Surgical History  Procedure Date  . Rotator cuff repair     Right  . Knee arthroscopy     Left  . Knee arthroscopy w/ acl reconstruction     left  . Inguinal hernia repair     left  . Vasectomy 05/2002  . Esophagogastroduodenoscopy 06/16/2003  . Stress cardiolite 07/15/2000    History   Social History  . Marital Status: Married    Spouse Name: N/A    Number of Children: N/A  . Years of Education: N/A   Occupational History  . Not on file.   Social History Main Topics  . Smoking status: Never Smoker   . Smokeless tobacco: Not on file  . Alcohol Use: 0.6 oz/week    1 Cans of beer per week  . Drug Use: No  . Sexually Active: Not on file   Other Topics Concern  . Not on file   Social History Narrative  . No narrative on file    Current Outpatient Prescriptions on File Prior to Visit  Medication Sig Dispense Refill  . Ascorbic Acid (VITAMIN C) 500 MG tablet Take 500 mg by mouth daily.        . celecoxib (CELEBREX) 200 MG capsule Take 200 mg by mouth as needed.        . clomiPHENE (CLOMID) 50 MG tablet Take 50 mg by mouth. 1/4 Tablet 4  Times a week       . glucosamine-chondroitin 500-400 MG tablet Take 1 tablet by mouth 2 (two) times daily.        Marland Kitchen levothyroxine (SYNTHROID, LEVOTHROID) 50 MCG tablet TAKE 1 TABLET ONCE A DAY  90 tablet  1  . metFORMIN (GLUCOPHAGE-XR) 500 MG 24 hr tablet TAKE 2 TABLETS TWICE A DAY  360 tablet  1  . Multiple Vitamin (MULTIVITAMIN) capsule Take 1 capsule by mouth daily.        . pioglitazone (ACTOS) 45 MG  tablet Take 1 tablet (45 mg total) by mouth daily.  90 tablet  1  . simvastatin (ZOCOR) 80 MG tablet TAKE 1 TABLET AT BEDTIME  90 tablet  1  . tadalafil (CIALIS) 20 MG tablet Take 20 mg by mouth as needed. Erectile dysfunction      . vitamin D, CHOLECALCIFEROL, 400 UNITS tablet Take 400 Units by mouth 2 (two) times daily.        . vitamin E 400 UNIT capsule Take 400 Units by mouth. Three times a week        No Known Allergies  Family History  Problem Relation Age of Onset  . Breast cancer Neg Hx   . Celiac disease Neg Hx   . Cirrhosis Neg Hx   . Clotting disorder Neg Hx   . Colitis Neg Hx   . Colon cancer Neg Hx   . Colon polyps Neg Hx   . Crohn's disease Neg Hx   . Cystic fibrosis Neg Hx   . Diabetes Neg  Hx   . Esophageal cancer Neg Hx   . Heart disease Neg Hx   . Hemochromatosis Neg Hx   . Inflammatory bowel disease Neg Hx   . Irritable bowel syndrome Neg Hx   . Kidney disease Neg Hx   . Liver cancer Neg Hx   . Liver disease Neg Hx   . Ovarian cancer Neg Hx   . Pancreatic cancer Neg Hx   . Prostate cancer Neg Hx   . Rectal cancer Neg Hx   . Stomach cancer Neg Hx   . Ulcerative colitis Neg Hx   . Uterine cancer Neg Hx   . Wilson's disease Neg Hx     BP 124/80  Pulse 80  Temp(Src) 98.2 F (36.8 C) (Oral)  Ht 5\' 8"  (1.727 m)  Wt 150 lb (68.04 kg)  BMI 22.81 kg/m2  SpO2 95%  Review of Systems denies hypoglycemia.      Objective:   Physical Exam VITAL SIGNS:  See vs page GENERAL: no distress    Lab Results  Component Value Date   HGBA1C 6.9* 12/11/2010      Assessment & Plan:  DM, Needs increased rx, if it can be done with a regimen that avoids or minimizes hypoglycemia.  i demonstrated victoza pen.

## 2011-02-28 ENCOUNTER — Other Ambulatory Visit: Payer: Self-pay

## 2011-02-28 MED ORDER — GLUCOSE BLOOD VI STRP: ORAL_STRIP | Status: AC

## 2011-02-28 NOTE — Telephone Encounter (Signed)
Pt called requesting refill of test strip for Glucometer given to him by his Nutritionist.

## 2011-03-05 ENCOUNTER — Other Ambulatory Visit: Payer: Self-pay | Admitting: Endocrinology

## 2011-03-08 ENCOUNTER — Other Ambulatory Visit: Payer: Self-pay

## 2011-03-08 MED ORDER — LIRAGLUTIDE 18 MG/3ML ~~LOC~~ SOLN: 1.8000 mg | SUBCUTANEOUS | Status: DC

## 2011-03-09 ENCOUNTER — Telehealth: Payer: Self-pay

## 2011-03-09 NOTE — Telephone Encounter (Signed)
Pt called stating he has been waking each morning with HA since increasing Victoza. Pt also checked his CBG this morning when he had the HA and it was 96. Pt is requesting advisement from MD, he has not taken Victoza yet this morning.

## 2011-03-09 NOTE — Telephone Encounter (Signed)
Try reducing the amount  If you are on the lowest amount, try taking a few days off, the try resuming

## 2011-03-09 NOTE — Telephone Encounter (Signed)
Pt informed of MD's advisement. 

## 2011-03-12 ENCOUNTER — Telehealth: Payer: Self-pay

## 2011-03-12 NOTE — Telephone Encounter (Signed)
Symptoms may improved with cont'd use, but ok to hold for today, and will ask Dr Everardo All to review to see if lower strength or change of med may be more appropriate

## 2011-03-12 NOTE — Telephone Encounter (Signed)
Pt advised of Dr Raphael Gibney recommendation and informed that additional recommendations will be requested from SAE when he returns to office.

## 2011-03-12 NOTE — Telephone Encounter (Signed)
In view of weight loss and your good body weight in general, please stop victoza for now

## 2011-03-12 NOTE — Telephone Encounter (Signed)
Left message for pt to callback office.  

## 2011-03-12 NOTE — Telephone Encounter (Signed)
Pt called stating he decreased Victoza 1.2 as advised 02/08 but on Saturday experienced a feeling of extreme fullness, so much so that he could not eat dinner. He did not take medication on Sunday and felt much better. Pt has also noticed that he has lost 10 lb since starting Victoza. Pt is requesting MD advisement on sxs, will they resolve in time or does he need a lower dose. Please advise.

## 2011-03-13 NOTE — Telephone Encounter (Signed)
Pt advised and would like MD's advisement on DM medications to take in place of Victoza, please advise,

## 2011-03-13 NOTE — Telephone Encounter (Signed)
i think it is best to take a break for now.  In 1-2 weeks, re-try the victoza at the lowest dosage (0.6 mg qd)

## 2011-03-13 NOTE — Telephone Encounter (Signed)
Left message for pt to callback office.  

## 2011-03-14 NOTE — Telephone Encounter (Signed)
Pt advised and expressed understanding.

## 2011-03-14 NOTE — Telephone Encounter (Signed)
Left message for pt to callback office.  

## 2011-03-16 ENCOUNTER — Encounter: Payer: Self-pay | Admitting: Endocrinology

## 2011-03-16 ENCOUNTER — Other Ambulatory Visit (INDEPENDENT_AMBULATORY_CARE_PROVIDER_SITE_OTHER): Payer: 59

## 2011-03-16 ENCOUNTER — Ambulatory Visit (INDEPENDENT_AMBULATORY_CARE_PROVIDER_SITE_OTHER): Payer: 59 | Admitting: Endocrinology

## 2011-03-16 ENCOUNTER — Other Ambulatory Visit: Payer: Self-pay | Admitting: Endocrinology

## 2011-03-16 VITALS — BP 102/58 | HR 82 | Temp 97.8°F | Wt 146.8 lb

## 2011-03-16 DIAGNOSIS — E119 Type 2 diabetes mellitus without complications: Secondary | ICD-10-CM

## 2011-03-16 LAB — HEMOGLOBIN A1C: Hgb A1c MFr Bld: 6.9 % — ABNORMAL HIGH (ref 4.6–6.5)

## 2011-03-16 NOTE — Patient Instructions (Addendum)
blood tests are being requested for you today.  please call [phone] to hear your test results.  You will be prompted to enter the 9-digit "MRN" number that appears at the top left of this page, followed by #.  Then you will hear the message. Depending on the result, i may advise you to resume the victoza at 0.6 mg daily.   (update: i left message on phone-tree:  rx as we discussed)

## 2011-03-16 NOTE — Progress Notes (Signed)
Subjective:    Patient ID: David Maynard, male    DOB: 1950/04/19, 61 y.o.   MRN: [id number]  HPI Pt states 1 week of slight pain at the throat, but no assoc fever.  These sxs are better now. He has lost a few lbs.   Past Medical History  Diagnosis Date  . Diabetes mellitus type II   . Hypercholesterolemia   . Thyroid disease   . Depressive disorder, not elsewhere classified   . Hypogonadism male   . Unspecified hypothyroidism     Past Surgical History  Procedure Date  . Rotator cuff repair     Right  . Knee arthroscopy     Left  . Knee arthroscopy w/ acl reconstruction     left  . Inguinal hernia repair     left  . Vasectomy 05/2002  . Esophagogastroduodenoscopy 06/16/2003  . Stress cardiolite 07/15/2000    History   Social History  . Marital Status: Married    Spouse Name: N/A    Number of Children: N/A  . Years of Education: N/A   Occupational History  . Not on file.   Social History Main Topics  . Smoking status: Never Smoker   . Smokeless tobacco: Not on file  . Alcohol Use: 0.6 oz/week    1 Cans of beer per week  . Drug Use: No  . Sexually Active: Not on file   Other Topics Concern  . Not on file   Social History Narrative  . No narrative on file    Current Outpatient Prescriptions on File Prior to Visit  Medication Sig Dispense Refill  . Ascorbic Acid (VITAMIN C) 500 MG tablet Take 500 mg by mouth daily.        . celecoxib (CELEBREX) 200 MG capsule Take 200 mg by mouth as needed.        . clomiPHENE (CLOMID) 50 MG tablet Take 50 mg by mouth. 1/4 Tablet 4  Times a week       . glucosamine-chondroitin 500-400 MG tablet Take 1 tablet by mouth 2 (two) times daily.        Marland Kitchen glucose blood (ACCU-CHEK SMARTVIEW) test strip Use as instructed  100 each  5  . levothyroxine (SYNTHROID, LEVOTHROID) 50 MCG tablet TAKE 1 TABLET ONCE A DAY  90 tablet  3  . Multiple Vitamin (MULTIVITAMIN) capsule Take 1 capsule by mouth daily.        . Omega-3 Fatty Acids  (ULTRA OMEGA-3 FISH OIL) 1400 MG CAPS Take 1 capsule by mouth daily.      . pioglitazone (ACTOS) 45 MG tablet Take 1 tablet (45 mg total) by mouth daily.  90 tablet  1  . simvastatin (ZOCOR) 80 MG tablet TAKE 1 TABLET AT BEDTIME  90 tablet  1  . vitamin D, CHOLECALCIFEROL, 400 UNITS tablet Take 400 Units by mouth 2 (two) times daily.        . vitamin E 400 UNIT capsule Take 400 Units by mouth. Three times a week        No Known Allergies  Family History  Problem Relation Age of Onset  . Breast cancer Neg Hx   . Celiac disease Neg Hx   . Cirrhosis Neg Hx   . Clotting disorder Neg Hx   . Colitis Neg Hx   . Colon cancer Neg Hx   . Colon polyps Neg Hx   . Crohn's disease Neg Hx   . Cystic fibrosis Neg Hx   .  Diabetes Neg Hx   . Esophageal cancer Neg Hx   . Heart disease Neg Hx   . Hemochromatosis Neg Hx   . Inflammatory bowel disease Neg Hx   . Irritable bowel syndrome Neg Hx   . Kidney disease Neg Hx   . Liver cancer Neg Hx   . Liver disease Neg Hx   . Ovarian cancer Neg Hx   . Pancreatic cancer Neg Hx   . Prostate cancer Neg Hx   . Rectal cancer Neg Hx   . Stomach cancer Neg Hx   . Ulcerative colitis Neg Hx   . Uterine cancer Neg Hx   . Wilson's disease Neg Hx     BP 102/58  Pulse 82  Temp(Src) 97.8 F (36.6 C) (Oral)  Wt 146 lb 12.8 oz (66.588 kg)  SpO2 97%   Review of Systems On victoza, he had early satiety and headache.  sxs resolved with discontinuation of the victoza    Objective:   Physical Exam VITAL SIGNS:  See vs page GENERAL: no distress she brings a record of her cbg's which i have reviewed today.   Lab Results  Component Value Date   HGBA1C 6.9* 03/16/2011      Assessment & Plan:  David Maynard, better DM.  He did not tolerate full dosage of victoza

## 2011-03-23 ENCOUNTER — Other Ambulatory Visit: Payer: Self-pay

## 2011-03-23 MED ORDER — PIOGLITAZONE HCL 45 MG PO TABS: 45.0000 mg | ORAL_TABLET | Freq: Every day | ORAL | Status: DC

## 2011-03-23 NOTE — Telephone Encounter (Signed)
Pt called stating he forgot his medication at home and [place] the beach. Limited supply sent to local pharmacy

## 2011-04-24 ENCOUNTER — Other Ambulatory Visit: Payer: Self-pay | Admitting: Endocrinology

## 2011-05-07 ENCOUNTER — Other Ambulatory Visit: Payer: Self-pay | Admitting: Endocrinology

## 2011-05-09 ENCOUNTER — Other Ambulatory Visit: Payer: Self-pay | Admitting: Endocrinology

## 2011-05-18 ENCOUNTER — Other Ambulatory Visit: Payer: Self-pay | Admitting: Endocrinology

## 2011-05-24 ENCOUNTER — Other Ambulatory Visit: Payer: Self-pay | Admitting: Endocrinology

## 2011-06-05 ENCOUNTER — Telehealth: Payer: Self-pay

## 2011-06-05 NOTE — Telephone Encounter (Signed)
Pt called requesting order to have A1C checked.

## 2011-06-05 NOTE — Telephone Encounter (Signed)
cpx is due.  We can do that along with other labs

## 2011-06-05 NOTE — Telephone Encounter (Signed)
Pt informed CPX is due. He will callback to scheduled for CPX.

## 2011-06-13 ENCOUNTER — Telehealth: Payer: Self-pay | Admitting: *Deleted

## 2011-06-13 DIAGNOSIS — Z0389 Encounter for observation for other suspected diseases and conditions ruled out: Secondary | ICD-10-CM

## 2011-06-13 DIAGNOSIS — E119 Type 2 diabetes mellitus without complications: Secondary | ICD-10-CM

## 2011-06-13 DIAGNOSIS — Z0000 Encounter for general adult medical examination without abnormal findings: Secondary | ICD-10-CM

## 2011-06-13 NOTE — Telephone Encounter (Signed)
Message copied by Carin Primrose on Wed Jun 13, 2011 11:55 AM ------      Message from: Etheleen Sia      Created: Wed Jun 06, 2011  3:52 PM      Regarding: PHYSICAL LABS       MAY 31 PHYSICAL / PLEASE PUT IN LABS.

## 2011-06-13 NOTE — Telephone Encounter (Signed)
CPX labs placed into Epic for upcoming appointment. 

## 2011-06-19 ENCOUNTER — Other Ambulatory Visit (INDEPENDENT_AMBULATORY_CARE_PROVIDER_SITE_OTHER): Payer: 59

## 2011-06-19 DIAGNOSIS — Z0389 Encounter for observation for other suspected diseases and conditions ruled out: Secondary | ICD-10-CM

## 2011-06-19 DIAGNOSIS — E119 Type 2 diabetes mellitus without complications: Secondary | ICD-10-CM

## 2011-06-19 DIAGNOSIS — Z0000 Encounter for general adult medical examination without abnormal findings: Secondary | ICD-10-CM

## 2011-06-19 LAB — HEPATIC FUNCTION PANEL
ALT: 15 U/L (ref 0–53)
Albumin: 3.6 g/dL (ref 3.5–5.2)
Total Bilirubin: 0.4 mg/dL (ref 0.3–1.2)
Total Protein: 5.8 g/dL — ABNORMAL LOW (ref 6.0–8.3)

## 2011-06-19 LAB — CBC WITH DIFFERENTIAL/PLATELET
Basophils Relative: 0.3 % (ref 0.0–3.0)
Eosinophils Relative: 3.9 % (ref 0.0–5.0)
HCT: 36 % — ABNORMAL LOW (ref 39.0–52.0)
Lymphs Abs: 1.5 10*3/uL (ref 0.7–4.0)
MCV: 86.9 fl (ref 78.0–100.0)
Monocytes Absolute: 0.5 10*3/uL (ref 0.1–1.0)
Monocytes Relative: 7.8 % (ref 3.0–12.0)
Neutrophils Relative %: 64.3 % (ref 43.0–77.0)
Platelets: 169 10*3/uL (ref 150.0–400.0)
RBC: 4.14 Mil/uL — ABNORMAL LOW (ref 4.22–5.81)
WBC: 6.2 10*3/uL (ref 4.5–10.5)

## 2011-06-19 LAB — URINALYSIS, ROUTINE W REFLEX MICROSCOPIC
Specific Gravity, Urine: 1.015 (ref 1.000–1.030)
Urine Glucose: NEGATIVE
pH: 6 (ref 5.0–8.0)

## 2011-06-19 LAB — BASIC METABOLIC PANEL
BUN: 23 mg/dL (ref 6–23)
CO2: 29 mEq/L (ref 19–32)
Chloride: 105 mEq/L (ref 96–112)
Creatinine, Ser: 1.1 mg/dL (ref 0.4–1.5)

## 2011-06-19 LAB — HEMOGLOBIN A1C: Hgb A1c MFr Bld: 6.5 % (ref 4.6–6.5)

## 2011-06-19 LAB — TSH: TSH: 2.68 u[IU]/mL (ref 0.35–5.50)

## 2011-06-19 LAB — LIPID PANEL
Cholesterol: 107 mg/dL (ref 0–200)
Triglycerides: 41 mg/dL (ref 0.0–149.0)

## 2011-06-21 ENCOUNTER — Telehealth: Payer: Self-pay | Admitting: Endocrinology

## 2011-06-21 NOTE — Telephone Encounter (Signed)
Mild anemia--we'll discuss tomorrow am. a1c is 6.5--good

## 2011-06-21 NOTE — Telephone Encounter (Signed)
Pt cannot access his labs after calling the number several times. Pls call with lab results.

## 2011-06-22 ENCOUNTER — Ambulatory Visit (INDEPENDENT_AMBULATORY_CARE_PROVIDER_SITE_OTHER): Payer: 59 | Admitting: Endocrinology

## 2011-06-22 ENCOUNTER — Other Ambulatory Visit (INDEPENDENT_AMBULATORY_CARE_PROVIDER_SITE_OTHER): Payer: 59

## 2011-06-22 ENCOUNTER — Encounter: Payer: Self-pay | Admitting: Endocrinology

## 2011-06-22 VITALS — BP 118/72 | HR 80 | Temp 97.6°F | Wt 139.0 lb

## 2011-06-22 DIAGNOSIS — E291 Testicular hypofunction: Secondary | ICD-10-CM

## 2011-06-22 DIAGNOSIS — E119 Type 2 diabetes mellitus without complications: Secondary | ICD-10-CM

## 2011-06-22 DIAGNOSIS — D649 Anemia, unspecified: Secondary | ICD-10-CM | POA: Insufficient documentation

## 2011-06-22 NOTE — Telephone Encounter (Signed)
Pt informed of lab results at [facility] with MD.

## 2011-06-22 NOTE — Progress Notes (Signed)
Subjective:    Patient ID: David Maynard, male    DOB: May 08, 1950, 61 y.o.   MRN: [id number]  HPI Pt says he last donated blood 1 month ago.  here for regular wellness examination.  He's feeling pretty well in general, and says chronic med probs are stable, except as noted below Past Medical History  Diagnosis Date  . Diabetes mellitus type II   . Hypercholesterolemia   . Thyroid disease   . Depressive disorder, not elsewhere classified   . Hypogonadism male   . Unspecified hypothyroidism     Past Surgical History  Procedure Date  . Rotator cuff repair     Right  . Knee arthroscopy     Left  . Knee arthroscopy w/ acl reconstruction     left  . Inguinal hernia repair     left  . Vasectomy 05/2002  . Esophagogastroduodenoscopy 06/16/2003  . Stress cardiolite 07/15/2000    History   Social History  . Marital Status: Married    Spouse Name: N/A    Number of Children: N/A  . Years of Education: N/A   Occupational History  . Not on file.   Social History Main Topics  . Smoking status: Never Smoker   . Smokeless tobacco: Not on file  . Alcohol Use: 0.6 oz/week    1 Cans of beer per week  . Drug Use: No  . Sexually Active: Not on file   Other Topics Concern  . Not on file   Social History Narrative  . No narrative on file    Current Outpatient Prescriptions on File Prior to Visit  Medication Sig Dispense Refill  . Ascorbic Acid (VITAMIN C) 500 MG tablet Take 500 mg by mouth daily.        . celecoxib (CELEBREX) 200 MG capsule Take 200 mg by mouth as needed.        Marland Kitchen CIALIS 20 MG tablet USE AS NEEDED  30 each  1  . clomiPHENE (CLOMID) 50 MG tablet TAKE 1/4 TABLET BY MOUTH FOUR TIMES WEEKLY  15 tablet  5  . glucosamine-chondroitin 500-400 MG tablet Take 1 tablet by mouth 2 (two) times daily.        Marland Kitchen glucose blood (ACCU-CHEK SMARTVIEW) test strip Use as instructed  100 each  5  . levothyroxine (SYNTHROID, LEVOTHROID) 50 MCG tablet TAKE 1 TABLET ONCE A DAY  90  tablet  3  . Liraglutide 18 MG/3ML SOLN Inject 0.6 mg into the skin every morning.       . metFORMIN (GLUCOPHAGE-XR) 500 MG 24 hr tablet TAKE 2 TABLETS TWICE A DAY  360 tablet  3  . Multiple Vitamin (MULTIVITAMIN) capsule Take 1 capsule by mouth daily.        . Omega-3 Fatty Acids (ULTRA OMEGA-3 FISH OIL) 1400 MG CAPS Take 1 capsule by mouth daily.      . pioglitazone (ACTOS) 45 MG tablet TAKE 1 TABLET DAILY  90 tablet  2  . simvastatin (ZOCOR) 80 MG tablet TAKE 1 TABLET AT BEDTIME  90 tablet  1  . vitamin D, CHOLECALCIFEROL, 400 UNITS tablet Take 400 Units by mouth 2 (two) times daily.        . vitamin E 400 UNIT capsule Take 400 Units by mouth. Three times a week        No Known Allergies  Family History  Problem Relation Age of Onset  . Breast cancer Neg Hx   . Celiac disease Neg Hx   .  Cirrhosis Neg Hx   . Clotting disorder Neg Hx   . Colitis Neg Hx   . Colon cancer Neg Hx   . Colon polyps Neg Hx   . Crohn's disease Neg Hx   . Cystic fibrosis Neg Hx   . Diabetes Neg Hx   . Esophageal cancer Neg Hx   . Heart disease Neg Hx   . Hemochromatosis Neg Hx   . Inflammatory bowel disease Neg Hx   . Irritable bowel syndrome Neg Hx   . Kidney disease Neg Hx   . Liver cancer Neg Hx   . Liver disease Neg Hx   . Ovarian cancer Neg Hx   . Pancreatic cancer Neg Hx   . Prostate cancer Neg Hx   . Rectal cancer Neg Hx   . Stomach cancer Neg Hx   . Ulcerative colitis Neg Hx   . Uterine cancer Neg Hx   . Wilson's disease Neg Hx     BP 118/72  Pulse 80  Temp(Src) 97.6 F (36.4 C) (Oral)  Wt 139 lb (63.05 kg)  SpO2 98%     Review of Systems  Constitutional: Negative for fever.       Pt has lost weight, due to his efforts  HENT: Negative for hearing loss.   Eyes: Negative for visual disturbance.  Respiratory: Negative for shortness of breath.   Cardiovascular: Negative for chest pain.  Gastrointestinal: Negative for anal bleeding.  Genitourinary: Negative for hematuria and  difficulty urinating.  Musculoskeletal: Positive for back pain.  Skin: Negative for rash.  Neurological: Negative for syncope and numbness.  Hematological: Does not bruise/bleed easily.  Psychiatric/Behavioral: Negative for dysphoric mood.       Objective:   Physical Exam VS: see vs page GEN: no distress HEAD: head: no deformity eyes: no periorbital swelling, no proptosis external nose and ears are normal mouth: no lesion seen NECK: supple, thyroid is not enlarged CHEST WALL: no deformity LUNGS: clear to auscultation BREASTS:  No gynecomastia CV: reg rate and rhythm, no murmur ABD: abdomen is soft, nontender.  no hepatosplenomegaly.  not distended.  no hernia.  RECTAL: normal external and internal exam.  heme neg. PROSTATE:  Normal size.  No nodule MUSCULOSKELETAL: muscle bulk and strength are grossly normal.  no obvious joint swelling.  gait is normal and steady EXTEMITIES: no deformity.  no ulcer on the feet.  feet are of normal color and temp.  no edema PULSES: dorsalis pedis intact bilat.  no carotid bruit NEURO:  cn 2-12 grossly intact.   readily moves all 4's.  sensation is intact to touch on the feet SKIN:  Normal texture and temperature.  No rash or suspicious lesion is visible.   NODES:  None palpable at the neck PSYCH: alert, oriented x3.  Does not appear anxious nor depressed.    Lab Results  Component Value Date   WBC 6.2 06/19/2011   HGB 11.7* 06/19/2011   HCT 36.0* 06/19/2011   PLT 169.0 06/19/2011   GLUCOSE 107* 06/19/2011   CHOL 107 06/19/2011   TRIG 41.0 06/19/2011   HDL 53.80 06/19/2011   LDLCALC 45 06/19/2011   ALT 15 06/19/2011   AST 22 06/19/2011   NA 140 06/19/2011   K 4.9 06/19/2011   CL 105 06/19/2011   CREATININE 1.1 06/19/2011   BUN 23 06/19/2011   CO2 29 06/19/2011   TSH 2.68 06/19/2011   PSA 3.03 06/19/2011   HGBA1C 6.5 06/19/2011   MICROALBUR 1.6 03/29/2009  Assessment & Plan:  Wellness visit today, with problems stable, except as noted. Anemia  due to donating blood.

## 2011-06-22 NOTE — Patient Instructions (Signed)
please consider these measures for your health:  minimize alcohol.  do not use tobacco products.  have a colonoscopy at least every 10 years from age 61.  keep firearms safely stored.  always use seat belts.  have working smoke alarms in your home.  see an eye doctor and dentist regularly.  never drive under the influence of alcohol or drugs (including prescription drugs).  those with fair skin should take precautions against the sun. Please come back for a follow-up appointment in 6 months.   blood tests are being requested for you today.  You will receive a letter with results.

## 2011-06-23 ENCOUNTER — Encounter: Payer: Self-pay | Admitting: Endocrinology

## 2011-06-25 ENCOUNTER — Telehealth: Payer: Self-pay | Admitting: *Deleted

## 2011-06-25 NOTE — Telephone Encounter (Signed)
Called pt to inform of lab results, pt informed (letter also mailed to pt). 

## 2011-07-06 ENCOUNTER — Encounter: Payer: Self-pay | Admitting: Endocrinology

## 2011-07-06 ENCOUNTER — Ambulatory Visit (INDEPENDENT_AMBULATORY_CARE_PROVIDER_SITE_OTHER): Payer: 59 | Admitting: Endocrinology

## 2011-07-06 VITALS — BP 112/68 | HR 66 | Temp 97.4°F | Ht 68.0 in | Wt 141.0 lb

## 2011-07-06 DIAGNOSIS — E119 Type 2 diabetes mellitus without complications: Secondary | ICD-10-CM

## 2011-07-06 DIAGNOSIS — E7800 Pure hypercholesterolemia, unspecified: Secondary | ICD-10-CM

## 2011-07-06 MED ORDER — SIMVASTATIN 20 MG PO TABS: 20.0000 mg | ORAL_TABLET | Freq: Every day | ORAL | Status: DC

## 2011-07-06 NOTE — Patient Instructions (Addendum)
Please continue to try to increase the victoza Reduce the simvastatin to 20 mg daily. Please come back for a follow-up appointment in 3 months. I hope you feel better soon.  If you don't feel better by next week, please call back.

## 2011-07-06 NOTE — Progress Notes (Signed)
Subjective:    Patient ID: David Maynard, male    DOB: 29-Apr-1950, 61 y.o.   MRN: [id number]  HPI Pt returns for f/u of type 2 DM (dx'ed 2008; no known complications).  He is trying to increase the victoza, as tolerated by nausea.   Pt has 2 days of slight pain at the right upper anterior chest, in the context of a fall playing tennis.  It is improving now.  Pain is worse in the context of sneezing, but not with ROM of the right shoulder.   Past Medical History  Diagnosis Date  . Diabetes mellitus type II   . Hypercholesterolemia   . Thyroid disease   . Depressive disorder, not elsewhere classified   . Hypogonadism male   . Unspecified hypothyroidism     Past Surgical History  Procedure Date  . Rotator cuff repair     Right  . Knee arthroscopy     Left  . Knee arthroscopy w/ acl reconstruction     left  . Inguinal hernia repair     left  . Vasectomy 05/2002  . Esophagogastroduodenoscopy 06/16/2003  . Stress cardiolite 07/15/2000    History   Social History  . Marital Status: Married    Spouse Name: N/A    Number of Children: N/A  . Years of Education: N/A   Occupational History  . Not on file.   Social History Main Topics  . Smoking status: Never Smoker   . Smokeless tobacco: Not on file  . Alcohol Use: 0.6 oz/week    1 Cans of beer per week  . Drug Use: No  . Sexually Active: Not on file   Other Topics Concern  . Not on file   Social History Narrative  . No narrative on file    Current Outpatient Prescriptions on File Prior to Visit  Medication Sig Dispense Refill  . Ascorbic Acid (VITAMIN C) 500 MG tablet Take 500 mg by mouth daily.        . celecoxib (CELEBREX) 200 MG capsule Take 200 mg by mouth as needed.        Marland Kitchen CIALIS 20 MG tablet USE AS NEEDED  30 each  1  . Cinnamon 500 MG capsule Take 500 mg by mouth 2 (two) times daily.      . clomiPHENE (CLOMID) 50 MG tablet TAKE 1/4 TABLET BY MOUTH FOUR TIMES WEEKLY  15 tablet  5  .  glucosamine-chondroitin 500-400 MG tablet Take 1 tablet by mouth 2 (two) times daily.        Marland Kitchen glucose blood (ACCU-CHEK SMARTVIEW) test strip Use as instructed  100 each  5  . levothyroxine (SYNTHROID, LEVOTHROID) 50 MCG tablet TAKE 1 TABLET ONCE A DAY  90 tablet  3  . Liraglutide 18 MG/3ML SOLN Inject 1.2 mg into the skin every morning.       . metFORMIN (GLUCOPHAGE-XR) 500 MG 24 hr tablet TAKE 2 TABLETS TWICE A DAY  360 tablet  3  . Multiple Vitamin (MULTIVITAMIN) capsule Take 1 capsule by mouth daily.        . Omega-3 Fatty Acids (ULTRA OMEGA-3 FISH OIL) 1400 MG CAPS Take 1 capsule by mouth daily.      . pioglitazone (ACTOS) 45 MG tablet TAKE 1 TABLET DAILY  90 tablet  2  . vitamin D, CHOLECALCIFEROL, 400 UNITS tablet Take 400 Units by mouth 2 (two) times daily.        . vitamin E 400 UNIT capsule Take  400 Units by mouth. Three times a week        No Known Allergies  Family History  Problem Relation Age of Onset  . Breast cancer Neg Hx   . Celiac disease Neg Hx   . Cirrhosis Neg Hx   . Clotting disorder Neg Hx   . Colitis Neg Hx   . Colon cancer Neg Hx   . Colon polyps Neg Hx   . Crohn's disease Neg Hx   . Cystic fibrosis Neg Hx   . Diabetes Neg Hx   . Esophageal cancer Neg Hx   . Heart disease Neg Hx   . Hemochromatosis Neg Hx   . Inflammatory bowel disease Neg Hx   . Irritable bowel syndrome Neg Hx   . Kidney disease Neg Hx   . Liver cancer Neg Hx   . Liver disease Neg Hx   . Ovarian cancer Neg Hx   . Pancreatic cancer Neg Hx   . Prostate cancer Neg Hx   . Rectal cancer Neg Hx   . Stomach cancer Neg Hx   . Ulcerative colitis Neg Hx   . Uterine cancer Neg Hx   . Wilson's disease Neg Hx    BP 112/68  Pulse 66  Temp 97.4 F (36.3 C) (Oral)  Ht 5\' 8"  (1.727 m)  Wt 141 lb (63.957 kg)  BMI 21.44 kg/m2  SpO2 95%  Review of Systems Denies loc and sob.      Objective:   Physical Exam VITAL SIGNS:  See vs page GENERAL: no distress Chest-wall: nontender LUNGS:   Clear to auscultation HEART:  Regular rate and rhythm without murmurs noted. Normal S1,S2.   SKIN: injection sites at the anterior abdomen are normal  Lab Results  Component Value Date   HGBA1C 6.5 06/19/2011   Lab Results  Component Value Date   CHOL 107 06/19/2011   HDL 53.80 06/19/2011   LDLCALC 45 06/19/2011   TRIG 41.0 06/19/2011   CHOLHDL 2 06/19/2011       Assessment & Plan:  DM.  Needs increased rx, if it can be done with a regimen that avoids or minimizes hypoglycemia. Chest-wall strain, new Dyslipidemia.  He can take a lower dosage of zocor

## 2011-08-20 ENCOUNTER — Telehealth: Payer: Self-pay | Admitting: Endocrinology

## 2011-08-20 NOTE — Telephone Encounter (Signed)
Caller: David Maynard/Patient; PCP: Romero Belling; CB#: [phone]; ; ; Call regarding Asking When Next A1C Is Due;  Pt calling inquiring when next A1C was due. Last one drawn on 06/13/11. Notes from MD visit on 5/31 indicate a f/u in 6 months was appropriate. Pt "not comfortable with that b/c A1C target was 6 and result on 5/22 was 6.5". Pt not easily reassured. Pt would like MD consulted and a f/u call. Pt's best call back # above.

## 2011-08-21 NOTE — Telephone Encounter (Signed)
Pt informed of MD's response via VM and to callback office with any questions/concerns.

## 2011-08-21 NOTE — Telephone Encounter (Signed)
Last done 06/19/11.  Ins won't pay until 09/19/11.  It is ordered

## 2011-09-19 ENCOUNTER — Other Ambulatory Visit (INDEPENDENT_AMBULATORY_CARE_PROVIDER_SITE_OTHER): Payer: 59

## 2011-09-19 ENCOUNTER — Encounter: Payer: Self-pay | Admitting: Endocrinology

## 2011-09-19 DIAGNOSIS — E7800 Pure hypercholesterolemia, unspecified: Secondary | ICD-10-CM

## 2011-09-19 DIAGNOSIS — E119 Type 2 diabetes mellitus without complications: Secondary | ICD-10-CM

## 2011-09-19 LAB — LIPID PANEL
LDL Cholesterol: 47 mg/dL (ref 0–99)
Total CHOL/HDL Ratio: 2

## 2011-11-02 ENCOUNTER — Other Ambulatory Visit: Payer: Self-pay | Admitting: General Practice

## 2011-11-02 ENCOUNTER — Other Ambulatory Visit: Payer: Self-pay

## 2011-11-02 MED ORDER — SIMVASTATIN 20 MG PO TABS: 20.0000 mg | ORAL_TABLET | Freq: Every day | ORAL | Status: DC

## 2011-11-05 ENCOUNTER — Other Ambulatory Visit: Payer: Self-pay | Admitting: General Practice

## 2011-11-06 ENCOUNTER — Other Ambulatory Visit: Payer: Self-pay | Admitting: General Practice

## 2011-11-06 MED ORDER — SIMVASTATIN 20 MG PO TABS: 20.0000 mg | ORAL_TABLET | Freq: Every day | ORAL | Status: DC

## 2011-11-22 ENCOUNTER — Other Ambulatory Visit: Payer: Self-pay | Admitting: *Deleted

## 2011-11-22 MED ORDER — PIOGLITAZONE HCL 45 MG PO TABS: 45.0000 mg | ORAL_TABLET | Freq: Every day | ORAL | Status: DC

## 2011-11-22 MED ORDER — LEVOTHYROXINE SODIUM 50 MCG PO TABS: 50.0000 ug | ORAL_TABLET | Freq: Every day | ORAL | Status: DC

## 2011-11-22 MED ORDER — LIRAGLUTIDE 18 MG/3ML ~~LOC~~ SOLN: 1.8000 mg | SUBCUTANEOUS | Status: DC

## 2011-11-22 NOTE — Telephone Encounter (Signed)
Refill request faxed as well to [phone].

## 2011-12-03 ENCOUNTER — Telehealth: Payer: Self-pay

## 2011-12-03 MED ORDER — INSULIN PEN NEEDLE 32G X 5 MM MISC: 1.8000 mg | Freq: Every morning | Status: DC

## 2011-12-03 NOTE — Telephone Encounter (Signed)
Pt calling for the needles that go with his victoza insulin pen.  They need to be sent to [facility], he was given sample last time.  Please let pt know when sent to the pharmacy.

## 2011-12-06 ENCOUNTER — Telehealth: Payer: Self-pay | Admitting: Endocrinology

## 2011-12-06 NOTE — Telephone Encounter (Signed)
Ov is due.  We can address then

## 2011-12-06 NOTE — Telephone Encounter (Signed)
Patient notified of need to schedule appt. With Dr. Everardo All concerning lab work to be ordered.

## 2011-12-06 NOTE — Telephone Encounter (Signed)
Patient called stating that he usually has his A1c checked every three months and would like to know if the MD would like him to have this done. Please advise.

## 2011-12-10 ENCOUNTER — Ambulatory Visit (INDEPENDENT_AMBULATORY_CARE_PROVIDER_SITE_OTHER): Payer: 59 | Admitting: Endocrinology

## 2011-12-10 ENCOUNTER — Encounter: Payer: Self-pay | Admitting: Endocrinology

## 2011-12-10 VITALS — BP 134/76 | HR 76 | Temp 98.4°F | Wt 139.0 lb

## 2011-12-10 DIAGNOSIS — E119 Type 2 diabetes mellitus without complications: Secondary | ICD-10-CM

## 2011-12-10 DIAGNOSIS — Z23 Encounter for immunization: Secondary | ICD-10-CM

## 2011-12-10 MED ORDER — LEVOTHYROXINE SODIUM 50 MCG PO TABS: 50.0000 ug | ORAL_TABLET | Freq: Every day | ORAL | Status: DC

## 2011-12-10 MED ORDER — PIOGLITAZONE HCL 45 MG PO TABS: 45.0000 mg | ORAL_TABLET | Freq: Every day | ORAL | Status: DC

## 2011-12-10 MED ORDER — TADALAFIL 20 MG PO TABS: 20.0000 mg | ORAL_TABLET | Freq: Every day | ORAL | Status: DC | PRN

## 2011-12-10 MED ORDER — METFORMIN HCL ER 500 MG PO TB24: 1000.0000 mg | ORAL_TABLET | Freq: Two times a day (BID) | ORAL | Status: DC

## 2011-12-10 MED ORDER — SCOPOLAMINE 1 MG/3DAYS TD PT72: 1.0000 | MEDICATED_PATCH | TRANSDERMAL | Status: DC

## 2011-12-10 NOTE — Progress Notes (Signed)
Subjective:    Patient ID: David Maynard, male    DOB: [birth year], 61 y.o.   MRN: [id number]  HPI Pt returns for f/u of type 2 DM (dx'ed 2008; no known complications).  He is still trying to increase the victoza, as tolerated by nausea.  no cbg record, but states cbg's are well-controlled Past Medical History  Diagnosis Date  . Diabetes mellitus type II   . Hypercholesterolemia   . Thyroid disease   . Depressive disorder, not elsewhere classified   . Hypogonadism male   . Unspecified hypothyroidism     Past Surgical History  Procedure Date  . Rotator cuff repair     Right  . Knee arthroscopy     Left  . Knee arthroscopy w/ acl reconstruction     left  . Inguinal hernia repair     left  . Vasectomy 05/2002  . Esophagogastroduodenoscopy 06/16/2003  . Stress cardiolite 07/15/2000    History   Social History  . Marital Status: Married    Spouse Name: N/A    Number of Children: N/A  . Years of Education: N/A   Occupational History  . Not on file.   Social History Main Topics  . Smoking status: Never Smoker   . Smokeless tobacco: Not on file  . Alcohol Use: 0.6 oz/week    1 Cans of beer per week  . Drug Use: No  . Sexually Active: Not on file   Other Topics Concern  . Not on file   Social History Narrative  . No narrative on file    Current Outpatient Prescriptions on File Prior to Visit  Medication Sig Dispense Refill  . Ascorbic Acid (VITAMIN C) 500 MG tablet Take 500 mg by mouth daily.        . celecoxib (CELEBREX) 200 MG capsule Take 200 mg by mouth as needed.        . Cinnamon 500 MG capsule Take 500 mg by mouth 2 (two) times daily.      . clomiPHENE (CLOMID) 50 MG tablet TAKE 1/4 TABLET BY MOUTH FOUR TIMES WEEKLY  15 tablet  5  . glucosamine-chondroitin 500-400 MG tablet Take 1 tablet by mouth 2 (two) times daily.        Marland Kitchen glucose blood (ACCU-CHEK SMARTVIEW) test strip Use as instructed  100 each  5  . Insulin Pen Needle 32G X 5 MM MISC 1.8 mg by  Does not apply route every morning.  30 each  2  . levothyroxine (SYNTHROID, LEVOTHROID) 50 MCG tablet Take 1 tablet (50 mcg total) by mouth daily.  90 tablet  1  . Liraglutide 18 MG/3ML SOLN Inject 0.3 mLs (1.8 mg total) into the skin every morning.  6 mL  1  . metFORMIN (GLUCOPHAGE-XR) 500 MG 24 hr tablet Take 2 tablets (1,000 mg total) by mouth 2 (two) times daily.  360 tablet  3  . Multiple Vitamin (MULTIVITAMIN) capsule Take 1 capsule by mouth daily.        . Omega-3 Fatty Acids (ULTRA OMEGA-3 FISH OIL) 1400 MG CAPS Take 1 capsule by mouth daily.      . pioglitazone (ACTOS) 45 MG tablet Take 1 tablet (45 mg total) by mouth daily.  90 tablet  1  . simvastatin (ZOCOR) 20 MG tablet Take 1 tablet (20 mg total) by mouth at bedtime.  90 tablet  3  . tadalafil (CIALIS) 20 MG tablet Take 1 tablet (20 mg total) by mouth daily as needed for  erectile dysfunction.  20 tablet  3  . vitamin D, CHOLECALCIFEROL, 400 UNITS tablet Take 400 Units by mouth 2 (two) times daily.        . vitamin E 400 UNIT capsule Take 400 Units by mouth. Three times a week        No Known Allergies  Family History  Problem Relation Age of Onset  . Breast cancer Neg Hx   . Celiac disease Neg Hx   . Cirrhosis Neg Hx   . Clotting disorder Neg Hx   . Colitis Neg Hx   . Colon cancer Neg Hx   . Colon polyps Neg Hx   . Crohn's disease Neg Hx   . Cystic fibrosis Neg Hx   . Diabetes Neg Hx   . Esophageal cancer Neg Hx   . Heart disease Neg Hx   . Hemochromatosis Neg Hx   . Inflammatory bowel disease Neg Hx   . Irritable bowel syndrome Neg Hx   . Kidney disease Neg Hx   . Liver cancer Neg Hx   . Liver disease Neg Hx   . Ovarian cancer Neg Hx   . Pancreatic cancer Neg Hx   . Prostate cancer Neg Hx   . Rectal cancer Neg Hx   . Stomach cancer Neg Hx   . Ulcerative colitis Neg Hx   . Uterine cancer Neg Hx   . Wilson's disease Neg Hx     BP 134/76  Pulse 76  Temp 98.4 F (36.9 C) (Oral)  Wt 139 lb (63.05 kg)  SpO2  95%  Review of Systems denies hypoglycemia.    Objective:   Physical Exam Pulses: dorsalis pedis intact bilat.   Feet: no deformity.  no ulcer on the feet.  feet are of normal color and temp.  no edema Neuro: sensation is intact to touch on the feet  Lab Results  Component Value Date   HGBA1C 6.4* 12/10/2011      Assessment & Plan:  DM, well-controlled

## 2011-12-10 NOTE — Patient Instructions (Addendum)
A diabetes blood test is being requested for you today.  We'll contact you with results.   Please come in for a regular physical after 06/21/12.

## 2011-12-11 LAB — HEMOGLOBIN A1C
Hgb A1c MFr Bld: 6.4 % — ABNORMAL HIGH (ref <5.7)
Mean Plasma Glucose: 137 mg/dL — ABNORMAL HIGH (ref <117)

## 2011-12-12 ENCOUNTER — Encounter: Payer: Self-pay | Admitting: Endocrinology

## 2011-12-19 ENCOUNTER — Encounter: Payer: Self-pay | Admitting: Endocrinology

## 2012-01-09 ENCOUNTER — Encounter: Payer: Self-pay | Admitting: Endocrinology

## 2012-02-13 ENCOUNTER — Ambulatory Visit (INDEPENDENT_AMBULATORY_CARE_PROVIDER_SITE_OTHER): Payer: 59 | Admitting: Endocrinology

## 2012-02-13 VITALS — BP 122/70 | HR 77 | Wt 140.0 lb

## 2012-02-13 DIAGNOSIS — R519 Headache, unspecified: Secondary | ICD-10-CM

## 2012-02-13 DIAGNOSIS — R51 Headache: Secondary | ICD-10-CM

## 2012-02-13 MED ORDER — AMOXICILLIN-POT CLAVULANATE 875-125 MG PO TABS: 1.0000 | ORAL_TABLET | Freq: Two times a day (BID) | ORAL | Status: AC

## 2012-02-13 NOTE — Patient Instructions (Signed)
i have sent a prescription to your pharmacy, for an antibiotic pill. I hope you feel better soon.  If you don't feel better by next week, please call back.  Please call sooner if you get worse. 

## 2012-02-13 NOTE — Progress Notes (Signed)
Subjective:    Patient ID: David Maynard, male    DOB: [birth year], 62 y.o.   MRN: [id number]  HPI Pt states 4 days of slight pain at the left maxillary area.  No assoc fever.  Dental eval of the sxs was neg a few days ago.   Past Medical History  Diagnosis Date  . Diabetes mellitus type II   . Hypercholesterolemia   . Thyroid disease   . Depressive disorder, not elsewhere classified   . Hypogonadism male   . Unspecified hypothyroidism     Past Surgical History  Procedure Date  . Rotator cuff repair     Right  . Knee arthroscopy     Left  . Knee arthroscopy w/ acl reconstruction     left  . Inguinal hernia repair     left  . Vasectomy 05/2002  . Esophagogastroduodenoscopy 06/16/2003  . Stress cardiolite 07/15/2000    History   Social History  . Marital Status: Married    Spouse Name: N/A    Number of Children: N/A  . Years of Education: N/A   Occupational History  . Not on file.   Social History Main Topics  . Smoking status: Never Smoker   . Smokeless tobacco: Not on file  . Alcohol Use: 0.6 oz/week    1 Cans of beer per week  . Drug Use: No  . Sexually Active: Not on file   Other Topics Concern  . Not on file   Social History Narrative  . No narrative on file    Current Outpatient Prescriptions on File Prior to Visit  Medication Sig Dispense Refill  . Ascorbic Acid (VITAMIN C) 500 MG tablet Take 500 mg by mouth daily.        . celecoxib (CELEBREX) 200 MG capsule Take 200 mg by mouth as needed.        . Cinnamon 500 MG capsule Take 500 mg by mouth 2 (two) times daily.      . clomiPHENE (CLOMID) 50 MG tablet TAKE 1/4 TABLET BY MOUTH FOUR TIMES WEEKLY  15 tablet  5  . glucosamine-chondroitin 500-400 MG tablet Take 1 tablet by mouth 2 (two) times daily.        Marland Kitchen glucose blood (ACCU-CHEK SMARTVIEW) test strip Use as instructed  100 each  5  . Insulin Pen Needle 32G X 5 MM MISC 1.8 mg by Does not apply route every morning.  30 each  2  . levothyroxine  (SYNTHROID, LEVOTHROID) 50 MCG tablet Take 1 tablet (50 mcg total) by mouth daily.  90 tablet  1  . Liraglutide 18 MG/3ML SOLN Inject 0.3 mLs (1.8 mg total) into the skin every morning.  6 mL  1  . metFORMIN (GLUCOPHAGE-XR) 500 MG 24 hr tablet Take 2 tablets (1,000 mg total) by mouth 2 (two) times daily.  360 tablet  3  . Multiple Vitamin (MULTIVITAMIN) capsule Take 1 capsule by mouth daily.        . Omega-3 Fatty Acids (ULTRA OMEGA-3 FISH OIL) 1400 MG CAPS Take 1 capsule by mouth daily.      . pioglitazone (ACTOS) 45 MG tablet Take 1 tablet (45 mg total) by mouth daily.  90 tablet  1  . scopolamine (TRANSDERM-SCOP) 1.5 MG Place 1 patch (1.5 mg total) onto the skin every 3 (three) days.  4 patch  12  . simvastatin (ZOCOR) 20 MG tablet Take 1 tablet (20 mg total) by mouth at bedtime.  90 tablet  3  .  tadalafil (CIALIS) 20 MG tablet Take 1 tablet (20 mg total) by mouth daily as needed for erectile dysfunction.  20 tablet  3  . vitamin D, CHOLECALCIFEROL, 400 UNITS tablet Take 400 Units by mouth 2 (two) times daily.        . vitamin E 400 UNIT capsule Take 400 Units by mouth. Three times a week        No Known Allergies  Family History  Problem Relation Age of Onset  . Breast cancer Neg Hx   . Celiac disease Neg Hx   . Cirrhosis Neg Hx   . Clotting disorder Neg Hx   . Colitis Neg Hx   . Colon cancer Neg Hx   . Colon polyps Neg Hx   . Crohn's disease Neg Hx   . Cystic fibrosis Neg Hx   . Diabetes Neg Hx   . Esophageal cancer Neg Hx   . Heart disease Neg Hx   . Hemochromatosis Neg Hx   . Inflammatory bowel disease Neg Hx   . Irritable bowel syndrome Neg Hx   . Kidney disease Neg Hx   . Liver cancer Neg Hx   . Liver disease Neg Hx   . Ovarian cancer Neg Hx   . Pancreatic cancer Neg Hx   . Prostate cancer Neg Hx   . Rectal cancer Neg Hx   . Stomach cancer Neg Hx   . Ulcerative colitis Neg Hx   . Uterine cancer Neg Hx   . Wilson's disease Neg Hx     BP 122/70  Pulse 77  Wt 140  lb (63.504 kg)  SpO2 98%    Review of Systems Denies earache.  He has slight nasal congestion.    Objective:   Physical Exam VITAL SIGNS:  See vs page GENERAL: no distress head: no deformity.   eyes: no periorbital swelling, no proptosis. external nose and ears are normal.   mouth: no lesion seen.   Both eac's and tm's are normal Left upper teet and adjacent periodontal area appear normal     Assessment & Plan:  Maxillary pain, new, uncertain etiology

## 2012-03-20 ENCOUNTER — Encounter: Payer: Self-pay | Admitting: Endocrinology

## 2012-03-23 ENCOUNTER — Other Ambulatory Visit: Payer: Self-pay | Admitting: Endocrinology

## 2012-03-23 DIAGNOSIS — E119 Type 2 diabetes mellitus without complications: Secondary | ICD-10-CM

## 2012-03-25 ENCOUNTER — Encounter: Payer: Self-pay | Admitting: Endocrinology

## 2012-03-25 ENCOUNTER — Ambulatory Visit: Payer: 59

## 2012-03-25 DIAGNOSIS — E119 Type 2 diabetes mellitus without complications: Secondary | ICD-10-CM

## 2012-05-05 ENCOUNTER — Telehealth: Payer: Self-pay | Admitting: Endocrinology

## 2012-05-05 NOTE — Telephone Encounter (Signed)
RECV'D NOTICE FROM [employer] THAT PT MAY NOT BE COMPLIANT W/ PRESCRIBED STATIN DRUGS D/T REFILL HISTORY. PER SONYA, PT NEEDS APPT. LM FOR PT TO CALL AND SCH APPT W/ DR. ELLISON. CURRENTLY AWAITING RETURN CALL / [facility]

## 2012-05-06 ENCOUNTER — Telehealth: Payer: Self-pay | Admitting: Endocrinology

## 2012-05-06 NOTE — Telephone Encounter (Signed)
Patient returned call to office and left message requesting call back to get appt sch w/ Dr. Everardo All. I called pt and left message for him to call. See previous message - pt non-compliant w/ statin use? [employer] sent letter and Dr. Everardo All requested follow up  / Oneita Kras

## 2012-05-12 ENCOUNTER — Encounter: Payer: Self-pay | Admitting: Endocrinology

## 2012-05-12 ENCOUNTER — Ambulatory Visit (INDEPENDENT_AMBULATORY_CARE_PROVIDER_SITE_OTHER): Payer: 59 | Admitting: Endocrinology

## 2012-05-12 VITALS — BP 126/80 | HR 75 | Wt 141.0 lb

## 2012-05-12 DIAGNOSIS — E119 Type 2 diabetes mellitus without complications: Secondary | ICD-10-CM

## 2012-05-12 NOTE — Progress Notes (Signed)
Subjective:    Patient ID: David Maynard, male    DOB: [birth year], 62 y.o.   MRN: [id number]  HPI Pt returns for f/u of type 2 DM (dx'ed 2008; no known complications).  He tolerates victoza (approx 1 mg/day) well.  no cbg record, but states cbg's are well-controlled.   He says he takes zocor as rx'ed.   Past Medical History  Diagnosis Date  . Diabetes mellitus type II   . Hypercholesterolemia   . Thyroid disease   . Depressive disorder, not elsewhere classified   . Hypogonadism male   . Unspecified hypothyroidism     Past Surgical History  Procedure Laterality Date  . Rotator cuff repair      Right  . Knee arthroscopy      Left  . Knee arthroscopy w/ acl reconstruction      left  . Inguinal hernia repair      left  . Vasectomy  05/2002  . Esophagogastroduodenoscopy  06/16/2003  . Stress cardiolite  07/15/2000    History   Social History  . Marital Status: Married    Spouse Name: N/A    Number of Children: N/A  . Years of Education: N/A   Occupational History  . Not on file.   Social History Main Topics  . Smoking status: Never Smoker   . Smokeless tobacco: Not on file  . Alcohol Use: 0.6 oz/week    1 Cans of beer per week  . Drug Use: No  . Sexually Active: Not on file   Other Topics Concern  . Not on file   Social History Narrative  . No narrative on file    Current Outpatient Prescriptions on File Prior to Visit  Medication Sig Dispense Refill  . Ascorbic Acid (VITAMIN C) 500 MG tablet Take 500 mg by mouth daily.        . celecoxib (CELEBREX) 200 MG capsule Take 200 mg by mouth as needed.        . Cinnamon 500 MG capsule Take 500 mg by mouth 2 (two) times daily.      . clomiPHENE (CLOMID) 50 MG tablet TAKE 1/4 TABLET BY MOUTH FOUR TIMES WEEKLY  15 tablet  5  . glucosamine-chondroitin 500-400 MG tablet Take 1 tablet by mouth 2 (two) times daily.        . Insulin Pen Needle 32G X 5 MM MISC 1.8 mg by Does not apply route every morning.  30 each  2   . levothyroxine (SYNTHROID, LEVOTHROID) 50 MCG tablet Take 1 tablet (50 mcg total) by mouth daily.  90 tablet  1  . Liraglutide 18 MG/3ML SOLN Inject 0.3 mLs (1.8 mg total) into the skin every morning.  6 mL  1  . metFORMIN (GLUCOPHAGE-XR) 500 MG 24 hr tablet Take 2 tablets (1,000 mg total) by mouth 2 (two) times daily.  360 tablet  3  . Multiple Vitamin (MULTIVITAMIN) capsule Take 1 capsule by mouth daily.        . Omega-3 Fatty Acids (ULTRA OMEGA-3 FISH OIL) 1400 MG CAPS Take 1 capsule by mouth daily.      . pioglitazone (ACTOS) 45 MG tablet Take 1 tablet (45 mg total) by mouth daily.  90 tablet  1  . scopolamine (TRANSDERM-SCOP) 1.5 MG Place 1 patch (1.5 mg total) onto the skin every 3 (three) days.  4 patch  12  . simvastatin (ZOCOR) 20 MG tablet Take 1 tablet (20 mg total) by mouth at bedtime.  90  tablet  3  . tadalafil (CIALIS) 20 MG tablet Take 1 tablet (20 mg total) by mouth daily as needed for erectile dysfunction.  20 tablet  3  . vitamin D, CHOLECALCIFEROL, 400 UNITS tablet Take 400 Units by mouth 2 (two) times daily.        . vitamin E 400 UNIT capsule Take 400 Units by mouth. Three times a week       No current facility-administered medications on file prior to visit.    No Known Allergies  Family History  Problem Relation Age of Onset  . Breast cancer Neg Hx   . Celiac disease Neg Hx   . Cirrhosis Neg Hx   . Clotting disorder Neg Hx   . Colitis Neg Hx   . Colon cancer Neg Hx   . Colon polyps Neg Hx   . Crohn's disease Neg Hx   . Cystic fibrosis Neg Hx   . Diabetes Neg Hx   . Esophageal cancer Neg Hx   . Heart disease Neg Hx   . Hemochromatosis Neg Hx   . Inflammatory bowel disease Neg Hx   . Irritable bowel syndrome Neg Hx   . Kidney disease Neg Hx   . Liver cancer Neg Hx   . Liver disease Neg Hx   . Ovarian cancer Neg Hx   . Pancreatic cancer Neg Hx   . Prostate cancer Neg Hx   . Rectal cancer Neg Hx   . Stomach cancer Neg Hx   . Ulcerative colitis Neg Hx    . Uterine cancer Neg Hx   . Wilson's disease Neg Hx     BP 126/80  Pulse 75  Wt 141 lb (63.957 kg)  BMI 21.44 kg/m2  SpO2 97%   Review of Systems Denies weight change.      Objective:   Physical Exam VITAL SIGNS:  See vs page GENERAL: no distress SKIN:  Insulin injection sites at the anterior abdomen are normal.    Lab Results  Component Value Date   HGBA1C 6.7* 03/25/2012      Assessment & Plan:  DM: well-controlled

## 2012-05-12 NOTE — Patient Instructions (Addendum)
Please continue the same medications. Please come in for a regular physical after 06/21/12.

## 2012-06-19 ENCOUNTER — Encounter: Payer: Self-pay | Admitting: Endocrinology

## 2012-06-19 ENCOUNTER — Other Ambulatory Visit: Payer: Self-pay | Admitting: Endocrinology

## 2012-06-19 DIAGNOSIS — E119 Type 2 diabetes mellitus without complications: Secondary | ICD-10-CM

## 2012-06-23 ENCOUNTER — Other Ambulatory Visit: Payer: Self-pay | Admitting: Endocrinology

## 2012-06-23 ENCOUNTER — Other Ambulatory Visit: Payer: Self-pay | Admitting: *Deleted

## 2012-06-23 MED ORDER — CLOMIPHENE CITRATE 50 MG PO TABS: ORAL_TABLET | ORAL | Status: DC

## 2012-07-01 ENCOUNTER — Ambulatory Visit (INDEPENDENT_AMBULATORY_CARE_PROVIDER_SITE_OTHER): Payer: 59

## 2012-07-01 DIAGNOSIS — E119 Type 2 diabetes mellitus without complications: Secondary | ICD-10-CM

## 2012-07-01 LAB — HEMOGLOBIN A1C: Hgb A1c MFr Bld: 6.4 % (ref 4.6–6.5)

## 2012-08-01 ENCOUNTER — Other Ambulatory Visit: Payer: Self-pay | Admitting: *Deleted

## 2012-08-01 MED ORDER — PIOGLITAZONE HCL 45 MG PO TABS: 45.0000 mg | ORAL_TABLET | Freq: Every day | ORAL | Status: DC

## 2012-08-18 ENCOUNTER — Encounter: Payer: Self-pay | Admitting: Endocrinology

## 2012-08-19 ENCOUNTER — Other Ambulatory Visit: Payer: Self-pay | Admitting: *Deleted

## 2012-08-19 MED ORDER — LEVOTHYROXINE SODIUM 50 MCG PO TABS: 50.0000 ug | ORAL_TABLET | Freq: Every day | ORAL | Status: DC

## 2012-09-18 ENCOUNTER — Encounter: Payer: Self-pay | Admitting: Endocrinology

## 2012-10-03 ENCOUNTER — Telehealth: Payer: Self-pay | Admitting: Endocrinology

## 2012-10-06 ENCOUNTER — Other Ambulatory Visit: Payer: Self-pay | Admitting: *Deleted

## 2012-10-06 MED ORDER — LIRAGLUTIDE 18 MG/3ML ~~LOC~~ SOPN: 1.8000 mg | PEN_INJECTOR | SUBCUTANEOUS | Status: DC

## 2012-10-06 NOTE — Telephone Encounter (Signed)
Rx refill

## 2012-10-07 ENCOUNTER — Telehealth: Payer: Self-pay | Admitting: Endocrinology

## 2012-10-07 MED ORDER — LIRAGLUTIDE 18 MG/3ML ~~LOC~~ SOPN: 1.8000 mg | PEN_INJECTOR | SUBCUTANEOUS | Status: DC

## 2012-10-07 NOTE — Telephone Encounter (Signed)
cpx is due Please refill x 1, pending that appt

## 2012-10-07 NOTE — Telephone Encounter (Signed)
Refill sent to pharmacy, please schedule pt for a cpx

## 2012-10-16 ENCOUNTER — Telehealth: Payer: Self-pay | Admitting: Endocrinology

## 2012-10-17 ENCOUNTER — Other Ambulatory Visit: Payer: Self-pay

## 2012-10-17 DIAGNOSIS — E119 Type 2 diabetes mellitus without complications: Secondary | ICD-10-CM

## 2012-10-17 DIAGNOSIS — Z0000 Encounter for general adult medical examination without abnormal findings: Secondary | ICD-10-CM

## 2012-10-17 DIAGNOSIS — Z0389 Encounter for observation for other suspected diseases and conditions ruled out: Secondary | ICD-10-CM

## 2012-10-17 NOTE — Telephone Encounter (Signed)
Please order a1c, urine microalbumin, and cpx labs.   Please advise pt cpx is due

## 2012-10-17 NOTE — Telephone Encounter (Signed)
PATIENT WANTS A CALL BACK TODAY ABOUT GETTING A A1C TEST HE HAS CALLED BACK TRYING TO FOUND OUT IF HE CAN GET THIS OR DOES HE NEED A APPOINTMENT FIRST? Please advise.

## 2012-10-17 NOTE — Telephone Encounter (Signed)
Pt advised.

## 2012-10-22 ENCOUNTER — Other Ambulatory Visit (INDEPENDENT_AMBULATORY_CARE_PROVIDER_SITE_OTHER): Payer: 59

## 2012-10-22 DIAGNOSIS — E119 Type 2 diabetes mellitus without complications: Secondary | ICD-10-CM

## 2012-10-22 DIAGNOSIS — Z0000 Encounter for general adult medical examination without abnormal findings: Secondary | ICD-10-CM

## 2012-10-22 DIAGNOSIS — Z0389 Encounter for observation for other suspected diseases and conditions ruled out: Secondary | ICD-10-CM

## 2012-10-22 LAB — URINALYSIS, ROUTINE W REFLEX MICROSCOPIC
Bilirubin Urine: NEGATIVE
Hgb urine dipstick: NEGATIVE
Leukocytes, UA: NEGATIVE
Nitrite: NEGATIVE
Total Protein, Urine: NEGATIVE
Urine Glucose: NEGATIVE
Urobilinogen, UA: 0.2 (ref 0.0–1.0)

## 2012-10-22 LAB — HEMOGLOBIN A1C: Hgb A1c MFr Bld: 6.6 % — ABNORMAL HIGH (ref 4.6–6.5)

## 2012-10-23 ENCOUNTER — Encounter: Payer: Self-pay | Admitting: Endocrinology

## 2012-10-23 LAB — BASIC METABOLIC PANEL
Chloride: 107 mEq/L (ref 96–112)
Creatinine, Ser: 1.2 mg/dL (ref 0.4–1.5)
Glucose, Bld: 128 mg/dL — ABNORMAL HIGH (ref 70–99)
Potassium: 4.3 mEq/L (ref 3.5–5.1)
Sodium: 138 mEq/L (ref 135–145)

## 2012-10-23 LAB — LIPID PANEL
Cholesterol: 151 mg/dL (ref 0–200)
HDL: 64.5 mg/dL (ref >39.00)
LDL Cholesterol: 72 mg/dL (ref 0–99)
Triglycerides: 73 mg/dL (ref 0.0–149.0)
VLDL: 14.6 mg/dL (ref 0.0–40.0)

## 2012-10-24 LAB — TSH: TSH: 2.22 u[IU]/mL (ref 0.35–5.50)

## 2012-10-24 LAB — PSA: PSA: 2.67 ng/mL (ref 0.10–4.00)

## 2012-10-27 ENCOUNTER — Encounter: Payer: Self-pay | Admitting: Endocrinology

## 2012-11-04 ENCOUNTER — Ambulatory Visit (INDEPENDENT_AMBULATORY_CARE_PROVIDER_SITE_OTHER): Payer: 59 | Admitting: Endocrinology

## 2012-11-04 ENCOUNTER — Encounter: Payer: Self-pay | Admitting: Endocrinology

## 2012-11-04 VITALS — BP 126/70 | HR 77 | Ht 68.0 in | Wt 147.0 lb

## 2012-11-04 DIAGNOSIS — Z0000 Encounter for general adult medical examination without abnormal findings: Secondary | ICD-10-CM

## 2012-11-04 DIAGNOSIS — E119 Type 2 diabetes mellitus without complications: Secondary | ICD-10-CM

## 2012-11-04 MED ORDER — SILDENAFIL CITRATE 20 MG PO TABS: ORAL_TABLET | ORAL | Status: DC

## 2012-11-04 NOTE — Progress Notes (Signed)
Subjective:    Patient ID: David Maynard, male    DOB: 12/23/1950, 62 y.o.   MRN: [id number]  HPI  Pt is here for regular wellness examination, and is feeling pretty well in general, and says chronic med probs are stable, except as noted below. Past Medical History  Diagnosis Date  . Diabetes mellitus type II   . Hypercholesterolemia   . Thyroid disease   . Depressive disorder, not elsewhere classified   . Hypogonadism male   . Unspecified hypothyroidism     Past Surgical History  Procedure Laterality Date  . Rotator cuff repair      Right  . Knee arthroscopy      Left  . Knee arthroscopy w/ acl reconstruction      left  . Inguinal hernia repair      left  . Vasectomy  05/2002  . Esophagogastroduodenoscopy  06/16/2003  . Stress cardiolite  07/15/2000    History   Social History  . Marital Status: Married    Spouse Name: N/A    Number of Children: N/A  . Years of Education: N/A   Occupational History  . Not on file.   Social History Main Topics  . Smoking status: Never Smoker   . Smokeless tobacco: Not on file  . Alcohol Use: 0.6 oz/week    1 Cans of beer per week  . Drug Use: No  . Sexual Activity: Not on file   Other Topics Concern  . Not on file   Social History Narrative  . No narrative on file    Current Outpatient Prescriptions on File Prior to Visit  Medication Sig Dispense Refill  . Ascorbic Acid (VITAMIN C) 500 MG tablet Take 500 mg by mouth daily.        . celecoxib (CELEBREX) 200 MG capsule Take 200 mg by mouth as needed.        . Cinnamon 500 MG capsule Take 500 mg by mouth 2 (two) times daily.      . clomiPHENE (CLOMID) 50 MG tablet 1/4 tab daily  15 tablet  5  . glucosamine-chondroitin 500-400 MG tablet Take 1 tablet by mouth 2 (two) times daily.        . Insulin Pen Needle 32G X 5 MM MISC 1.8 mg by Does not apply route every morning.  30 each  2  . levothyroxine (SYNTHROID, LEVOTHROID) 50 MCG tablet Take 1 tablet (50 mcg total) by  mouth daily.  90 tablet  1  . Liraglutide 18 MG/3ML SOLN Inject 0.3 mLs (1.8 mg total) into the skin every morning.  6 mL  1  . Liraglutide 18 MG/3ML SOPN Inject 1.8 mg into the skin every morning.  9 mL  1  . metFORMIN (GLUCOPHAGE-XR) 500 MG 24 hr tablet Take 2 tablets (1,000 mg total) by mouth 2 (two) times daily.  360 tablet  3  . methocarbamol (ROBAXIN) 750 MG tablet Take 750 mg by mouth 3 (three) times daily.      . Multiple Vitamin (MULTIVITAMIN) capsule Take 1 capsule by mouth daily.        . Omega-3 Fatty Acids (ULTRA OMEGA-3 FISH OIL) 1400 MG CAPS Take 1 capsule by mouth daily.      . pioglitazone (ACTOS) 45 MG tablet Take 1 tablet (45 mg total) by mouth daily.  90 tablet  1  . scopolamine (TRANSDERM-SCOP) 1.5 MG Place 1 patch (1.5 mg total) onto the skin every 3 (three) days.  4 patch  12  .  simvastatin (ZOCOR) 20 MG tablet Take 1 tablet (20 mg total) by mouth at bedtime.  90 tablet  3  . tadalafil (CIALIS) 20 MG tablet Take 1 tablet (20 mg total) by mouth daily as needed for erectile dysfunction.  20 tablet  3  . vitamin D, CHOLECALCIFEROL, 400 UNITS tablet Take 400 Units by mouth 2 (two) times daily.        . vitamin E 400 UNIT capsule Take 400 Units by mouth. Three times a week       No current facility-administered medications on file prior to visit.    No Known Allergies  Family History  Problem Relation Age of Onset  . Breast cancer Neg Hx   . Celiac disease Neg Hx   . Cirrhosis Neg Hx   . Clotting disorder Neg Hx   . Colitis Neg Hx   . Colon cancer Neg Hx   . Colon polyps Neg Hx   . Crohn's disease Neg Hx   . Cystic fibrosis Neg Hx   . Diabetes Neg Hx   . Esophageal cancer Neg Hx   . Heart disease Neg Hx   . Hemochromatosis Neg Hx   . Inflammatory bowel disease Neg Hx   . Irritable bowel syndrome Neg Hx   . Kidney disease Neg Hx   . Liver cancer Neg Hx   . Liver disease Neg Hx   . Ovarian cancer Neg Hx   . Pancreatic cancer Neg Hx   . Prostate cancer Neg Hx    . Rectal cancer Neg Hx   . Stomach cancer Neg Hx   . Ulcerative colitis Neg Hx   . Uterine cancer Neg Hx   . Wilson's disease Neg Hx    BP 126/70  Pulse 77  Ht 5\' 8"  (1.727 m)  Wt 147 lb (66.679 kg)  BMI 22.36 kg/m2  SpO2 97%  Review of Systems  Constitutional: Negative for fever and unexpected weight change.  HENT: Negative for hearing loss.   Eyes: Negative for visual disturbance.  Respiratory: Negative for shortness of breath.   Cardiovascular: Negative for chest pain.  Gastrointestinal: Negative for anal bleeding.  Endocrine: Negative for cold intolerance.  Genitourinary: Negative for hematuria.  Musculoskeletal: Positive for back pain.  Skin: Negative for rash.  Allergic/Immunologic: Negative for environmental allergies.  Neurological: Negative for syncope and numbness.  Hematological: Does not bruise/bleed easily.  Psychiatric/Behavioral: Negative for dysphoric mood.      Objective:   Physical Exam VS: see vs page GEN: no distress HEAD: head: no deformity eyes: no periorbital swelling, no proptosis external nose and ears are normal mouth: no lesion seen NECK: supple, thyroid is not enlarged CHEST WALL: no deformity LUNGS: clear to auscultation BREASTS:  No gynecomastia CV: reg rate and rhythm, no murmur ABD: abdomen is soft, nontender.  no hepatosplenomegaly.  not distended.  no hernia RECTAL: normal external and internal exam.  heme neg. PROSTATE:  Normal size.  No nodule MUSCULOSKELETAL: muscle bulk and strength are grossly normal.  no obvious joint swelling.  gait is normal and steady PULSES: no carotid bruit NEURO:  cn 2-12 grossly intact.   readily moves all 4's.  SKIN:  Normal texture and temperature.  No rash or suspicious lesion is visible.   NODES:  None palpable at the neck PSYCH: alert, oriented x3.  Does not appear anxious nor depressed.  Lab Results  Component Value Date   HGBA1C 6.6* 10/22/2012      Assessment & Plan:  Wellness visit  today, with problems stable, except as noted.  we discussed code status.  pt requests full code, but would not want to be started or maintained on artificial life-support measures if there was not a reasonable chance of recovery.

## 2012-11-04 NOTE — Patient Instructions (Addendum)
Please try to nudge up the victoza, as your appetite and nausea will tolerate.  please consider these measures for your health:  minimize alcohol.  do not use tobacco products.  have a colonoscopy at least every 10 years from age 62.  keep firearms safely stored.  always use seat belts.  have working smoke alarms in your home.  see an eye doctor and dentist regularly.  never drive under the influence of alcohol or drugs (including prescription drugs).  those with fair skin should take precautions against the sun. Please redo the blood test in 3 months Please come back for a follow-up appointment in 6 months.   Please let me know if you want to have an "echo" (heart) test, for "right bundle branch block."

## 2012-11-06 DIAGNOSIS — Z0000 Encounter for general adult medical examination without abnormal findings: Secondary | ICD-10-CM | POA: Insufficient documentation

## 2012-11-10 ENCOUNTER — Telehealth: Payer: Self-pay | Admitting: Endocrinology

## 2012-11-10 ENCOUNTER — Other Ambulatory Visit: Payer: Self-pay | Admitting: Endocrinology

## 2012-11-10 ENCOUNTER — Other Ambulatory Visit: Payer: Self-pay | Admitting: Internal Medicine

## 2012-11-10 NOTE — Telephone Encounter (Signed)
Pt calling w/ status if refill for Zocor. Says he left message this AM on [facility] but did not get a call back. Please call

## 2012-11-11 ENCOUNTER — Telehealth: Payer: Self-pay | Admitting: Endocrinology

## 2012-11-11 ENCOUNTER — Other Ambulatory Visit: Payer: Self-pay | Admitting: *Deleted

## 2012-11-11 MED ORDER — PIOGLITAZONE HCL 45 MG PO TABS: 45.0000 mg | ORAL_TABLET | Freq: Every day | ORAL | Status: DC

## 2012-11-11 MED ORDER — METFORMIN HCL ER 500 MG PO TB24: 1000.0000 mg | ORAL_TABLET | Freq: Two times a day (BID) | ORAL | Status: DC

## 2012-11-11 MED ORDER — INSULIN PEN NEEDLE 32G X 5 MM MISC: 1.0000 | Freq: Every morning | Status: DC

## 2012-11-11 MED ORDER — SIMVASTATIN 20 MG PO TABS: 20.0000 mg | ORAL_TABLET | Freq: Every day | ORAL | Status: DC

## 2012-11-27 ENCOUNTER — Other Ambulatory Visit: Payer: Self-pay

## 2013-02-21 ENCOUNTER — Other Ambulatory Visit: Payer: Self-pay | Admitting: Endocrinology

## 2013-02-21 ENCOUNTER — Encounter: Payer: Self-pay | Admitting: Endocrinology

## 2013-02-23 ENCOUNTER — Other Ambulatory Visit: Payer: Self-pay | Admitting: *Deleted

## 2013-02-23 MED ORDER — LEVOTHYROXINE SODIUM 50 MCG PO TABS: 50.0000 ug | ORAL_TABLET | Freq: Every day | ORAL | Status: DC

## 2013-03-10 ENCOUNTER — Encounter: Payer: Self-pay | Admitting: Endocrinology

## 2013-03-11 ENCOUNTER — Other Ambulatory Visit: Payer: Self-pay | Admitting: Endocrinology

## 2013-03-11 ENCOUNTER — Other Ambulatory Visit (INDEPENDENT_AMBULATORY_CARE_PROVIDER_SITE_OTHER): Payer: 59

## 2013-03-11 DIAGNOSIS — E119 Type 2 diabetes mellitus without complications: Secondary | ICD-10-CM

## 2013-03-11 LAB — HEMOGLOBIN A1C: HEMOGLOBIN A1C: 6.8 % — AB (ref 4.6–6.5)

## 2013-03-27 ENCOUNTER — Other Ambulatory Visit: Payer: Self-pay | Admitting: Endocrinology

## 2013-03-27 ENCOUNTER — Encounter: Payer: Self-pay | Admitting: Endocrinology

## 2013-03-27 DIAGNOSIS — E119 Type 2 diabetes mellitus without complications: Secondary | ICD-10-CM

## 2013-04-02 ENCOUNTER — Encounter: Payer: Self-pay | Admitting: Endocrinology

## 2013-04-03 ENCOUNTER — Other Ambulatory Visit: Payer: Self-pay | Admitting: *Deleted

## 2013-04-03 MED ORDER — GLUCOSE BLOOD VI STRP: ORAL_STRIP | Status: DC

## 2013-05-05 ENCOUNTER — Encounter: Payer: Self-pay | Admitting: Endocrinology

## 2013-05-12 ENCOUNTER — Ambulatory Visit (INDEPENDENT_AMBULATORY_CARE_PROVIDER_SITE_OTHER): Payer: 59 | Admitting: Endocrinology

## 2013-05-12 ENCOUNTER — Encounter: Payer: Self-pay | Admitting: Endocrinology

## 2013-05-12 ENCOUNTER — Telehealth: Payer: Self-pay | Admitting: Endocrinology

## 2013-05-12 VITALS — BP 112/60 | HR 71 | Temp 98.4°F | Ht 68.0 in | Wt 146.0 lb

## 2013-05-12 DIAGNOSIS — M795 Residual foreign body in soft tissue: Secondary | ICD-10-CM

## 2013-05-12 MED ORDER — METFORMIN HCL ER 500 MG PO TB24: 1000.0000 mg | ORAL_TABLET | Freq: Two times a day (BID) | ORAL | Status: DC

## 2013-05-12 MED ORDER — SCOPOLAMINE 1 MG/3DAYS TD PT72: 1.0000 | MEDICATED_PATCH | TRANSDERMAL | Status: DC

## 2013-05-12 NOTE — Patient Instructions (Addendum)
check your blood sugar once a day.  vary the time of day when you check, between before the 3 meals, and at bedtime.  also check if you have symptoms of your blood sugar being too high or too low.  please keep a record of the readings and bring it to your next appointment here.  You can write it on any piece of paper.  please call us sooner if your blood sugar goes below 70, or if you have a lot of readings over 200. Please come back for a regular physical appointment in 6 months.   Please recheck the a1c next month.  If it is high, we can add "bromocriptine."

## 2013-05-12 NOTE — Telephone Encounter (Signed)
please call patient: Sorry, the ortho ref was for a different patient

## 2013-05-12 NOTE — Progress Notes (Signed)
Subjective:    Patient ID: David Maynard, male    DOB: 1950/11/20, 63 y.o.   MRN: [id number]  HPI Pt returns for f/u of type 2 DM (dx'ed 2008; no known complications; he has never taken insulin).  He tolerates victoza (approx 1 mg/day) well.  no cbg record, but states cbg's are well-controlled.   Past Medical History  Diagnosis Date  . Diabetes mellitus type II   . Hypercholesterolemia   . Thyroid disease   . Depressive disorder, not elsewhere classified   . Hypogonadism male   . Unspecified hypothyroidism     Past Surgical History  Procedure Laterality Date  . Rotator cuff repair      Right  . Knee arthroscopy      Left  . Knee arthroscopy w/ acl reconstruction      left  . Inguinal hernia repair      left  . Vasectomy  05/2002  . Esophagogastroduodenoscopy  06/16/2003  . Stress cardiolite  07/15/2000    History   Social History  . Marital Status: Married    Spouse Name: N/A    Number of Children: N/A  . Years of Education: N/A   Occupational History  . Not on file.   Social History Main Topics  . Smoking status: Never Smoker   . Smokeless tobacco: Not on file  . Alcohol Use: 0.6 oz/week    1 Cans of beer per week  . Drug Use: No  . Sexual Activity: Not on file   Other Topics Concern  . Not on file   Social History Narrative  . No narrative on file    Current Outpatient Prescriptions on File Prior to Visit  Medication Sig Dispense Refill  . Ascorbic Acid (VITAMIN C) 500 MG tablet Take 500 mg by mouth daily.        . celecoxib (CELEBREX) 200 MG capsule Take 200 mg by mouth as needed.        . Cinnamon 500 MG capsule Take 500 mg by mouth 6 (six) times daily.       Marland Kitchen. glucosamine-chondroitin 500-400 MG tablet Take 1 tablet by mouth 2 (two) times daily.        Marland Kitchen. glucose blood (ACCU-CHEK SMARTVIEW) test strip Use to test blood glucose 1 time daily  50 each  3  . Insulin Pen Needle 32G X 5 MM MISC 1 each by Does not apply route every morning.  100 each   1  . levothyroxine (SYNTHROID, LEVOTHROID) 50 MCG tablet Take 1 tablet (50 mcg total) by mouth daily before breakfast.  90 tablet  2  . Multiple Vitamin (MULTIVITAMIN) capsule Take 1 capsule by mouth daily.        . Omega-3 Fatty Acids (ULTRA OMEGA-3 FISH OIL) 1400 MG CAPS Take 1 capsule by mouth daily.      . pioglitazone (ACTOS) 45 MG tablet Take 1 tablet (45 mg total) by mouth daily.  90 tablet  2  . sildenafil (REVATIO) 20 MG tablet 3-5 pills as needed for ED symptoms  30 tablet  0  . simvastatin (ZOCOR) 20 MG tablet Take 1 tablet (20 mg total) by mouth at bedtime.  90 tablet  3  . vitamin D, CHOLECALCIFEROL, 400 UNITS tablet Take 400 Units by mouth 2 (two) times daily.        . vitamin E 400 UNIT capsule Take 400 Units by mouth. Three times a week       No current facility-administered medications  on file prior to visit.    No Known Allergies  Family History  Problem Relation Age of Onset  . Breast cancer Neg Hx   . Celiac disease Neg Hx   . Cirrhosis Neg Hx   . Clotting disorder Neg Hx   . Colitis Neg Hx   . Colon cancer Neg Hx   . Colon polyps Neg Hx   . Crohn's disease Neg Hx   . Cystic fibrosis Neg Hx   . Diabetes Neg Hx   . Esophageal cancer Neg Hx   . Heart disease Neg Hx   . Hemochromatosis Neg Hx   . Inflammatory bowel disease Neg Hx   . Irritable bowel syndrome Neg Hx   . Kidney disease Neg Hx   . Liver cancer Neg Hx   . Liver disease Neg Hx   . Ovarian cancer Neg Hx   . Pancreatic cancer Neg Hx   . Prostate cancer Neg Hx   . Rectal cancer Neg Hx   . Stomach cancer Neg Hx   . Ulcerative colitis Neg Hx   . Uterine cancer Neg Hx   . Wilson's disease Neg Hx     BP 112/60  Pulse 71  Temp(Src) 98.4 F (36.9 C) (Oral)  Ht 5\' 8"  (1.727 m)  Wt 146 lb (66.225 kg)  BMI 22.20 kg/m2  SpO2 97%  Review of Systems Denies weight change    Objective:   Physical Exam VITAL SIGNS:  See vs page GENERAL: no distress   Lab Results  Component Value Date    HGBA1C 6.8* 03/11/2013      Assessment & Plan:  DM: well-controlled.  He may be evolving type 1 DM.

## 2013-05-12 NOTE — Telephone Encounter (Signed)
Pt informed

## 2013-05-13 ENCOUNTER — Encounter: Payer: Self-pay | Admitting: *Deleted

## 2013-05-13 ENCOUNTER — Encounter: Payer: 59 | Attending: Endocrinology | Admitting: *Deleted

## 2013-05-13 VITALS — Ht 68.0 in | Wt 146.1 lb

## 2013-05-13 DIAGNOSIS — E119 Type 2 diabetes mellitus without complications: Secondary | ICD-10-CM

## 2013-05-13 DIAGNOSIS — Z713 Dietary counseling and surveillance: Secondary | ICD-10-CM | POA: Insufficient documentation

## 2013-05-13 DIAGNOSIS — Z0279 Encounter for issue of other medical certificate: Secondary | ICD-10-CM

## 2013-05-13 NOTE — Patient Instructions (Addendum)
Plan:  Aim for 2-3 Carb Choices per meal (30-45 grams) +/- 1 either way  Aim for 0-15 Carbs per snack if hungry  Include protein in moderation with your meals and snacks Consider reading food labels for Total Carbohydrate and Fat Grams of foods Continue  your activity level daily as tolerated Consider checking BG at alternate times per day as directed by MD  Continue taking medication as directed by MD  Consider [facility] Protein Bars for snack Reduce volume of pop-corn to 15g snack Consider [employer]seyBrown for butter substitute Consider sara Nedra HaiLee / [facility] reduced calorie bread (45-50cal) Consider CalorieKing app for phone, see other available apps

## 2013-05-13 NOTE — Progress Notes (Signed)
Appt start time: 1400 end time:  1530.  Assessment:  Patient was seen on  05/13/13 for individual diabetes education. [name] comes for DSME with his wife who is a Advice workerphysician assistant. His primary concern is dietary modification to control his glucose. He is "retired" yet continues to work part time.  Current HbA1c: 6.8%  Preferred Learning Style:   No preference indicated   Learning Readiness:   Change in progress  MEDICATIONS: See List: Metformin, Victoza, Actos  DIETARY INTAKE:  B (7 AM): wheaties, skim milk  L ( 12 PM): salad, spinach, strawberries, apples, tomatoes, broccoli, carrots, green peppers, boiled egg, [place]urkey, chedder cheese, nuts, croutons, low cal dressing D ( PM): fried fish, sweet potatoes Snk ( PM): Popcorn Beverages: water, diet cranberry/grape juice, diet soda, black coffee, skim milk  Usual physical activity: Golf, tennis, walking exceeds 150 minutes per week  Intervention:  Nutrition counseling provided.  Discussed diabetes disease process and treatment options.  Discussed physiology of diabetes and role of obesity on insulin resistance.  Encouraged moderate weight reduction to improve glucose levels.  Discussed role of medications and diet in glucose control  Provided education on macronutrients on glucose levels.  Provided education on carb counting, importance of regularly scheduled meals/snacks, and meal planning  Discussed effects of physical activity on glucose levels and long-term glucose control.  Recommended 150 minutes of physical activity/week.  Reviewed patient medications.  Discussed role of medication on blood glucose and possible side effects  Reduce volume of pop-corn to 15g snack Consider Brummel [employer] PasseyBrown for butter substitute Consider sara Nedra HaiLee / [facility] reduced calorie bread (45-50cal) Consider CalorieKing app for phone, see other available apps  Teaching Method Utilized:  Visual Auditory Hands on  Handouts given during  visit include: Living Well with Diabetes Carb Counting  Meal Plan Card Snack Sheet  Barriers to learning/adherence to lifestyle change: None noted  Diabetes self-care support plan:   [facility] support group  Endocrinologist  family  Demonstrated degree of understanding via:  Teach Back   Monitoring/Evaluation:  Dietary intake, exercise, test glucose, and body weight, follow up prn.

## 2013-06-06 ENCOUNTER — Encounter: Payer: Self-pay | Admitting: Endocrinology

## 2013-06-08 ENCOUNTER — Other Ambulatory Visit: Payer: Self-pay | Admitting: Endocrinology

## 2013-06-08 DIAGNOSIS — E119 Type 2 diabetes mellitus without complications: Secondary | ICD-10-CM

## 2013-06-09 ENCOUNTER — Encounter: Payer: Self-pay | Admitting: Endocrinology

## 2013-06-10 ENCOUNTER — Encounter: Payer: Self-pay | Admitting: Endocrinology

## 2013-06-10 ENCOUNTER — Other Ambulatory Visit (INDEPENDENT_AMBULATORY_CARE_PROVIDER_SITE_OTHER): Payer: 59

## 2013-06-10 ENCOUNTER — Other Ambulatory Visit: Payer: Self-pay | Admitting: Endocrinology

## 2013-06-10 DIAGNOSIS — E119 Type 2 diabetes mellitus without complications: Secondary | ICD-10-CM

## 2013-06-10 LAB — HEMOGLOBIN A1C: Hgb A1c MFr Bld: 7.1 % — ABNORMAL HIGH (ref 4.6–6.5)

## 2013-06-11 MED ORDER — LIRAGLUTIDE 18 MG/3ML ~~LOC~~ SOPN: PEN_INJECTOR | SUBCUTANEOUS | Status: DC

## 2013-06-11 MED ORDER — BROMOCRIPTINE MESYLATE 2.5 MG PO TABS: 1.2500 mg | ORAL_TABLET | Freq: Every day | ORAL | Status: DC

## 2013-06-17 ENCOUNTER — Encounter: Payer: Self-pay | Admitting: Endocrinology

## 2013-06-21 ENCOUNTER — Encounter: Payer: Self-pay | Admitting: Endocrinology

## 2013-06-22 ENCOUNTER — Encounter: Payer: Self-pay | Admitting: Endocrinology

## 2013-06-22 LAB — HM DIABETES EYE EXAM

## 2013-06-23 ENCOUNTER — Telehealth: Payer: Self-pay | Admitting: *Deleted

## 2013-06-23 ENCOUNTER — Encounter: Payer: Self-pay | Admitting: Endocrinology

## 2013-06-23 MED ORDER — GLUCOSE BLOOD VI STRP: ORAL_STRIP | Status: DC

## 2013-06-23 NOTE — Telephone Encounter (Signed)
Rx sent to pharmacy   

## 2013-06-23 NOTE — Telephone Encounter (Signed)
Need test stripe 6 per day for 90 days need it ASAP, they would have to be fed x sent an email to [employer] through [employer]

## 2013-07-24 ENCOUNTER — Other Ambulatory Visit: Payer: Self-pay | Admitting: Endocrinology

## 2013-08-16 ENCOUNTER — Other Ambulatory Visit: Payer: Self-pay | Admitting: Endocrinology

## 2013-09-07 ENCOUNTER — Encounter: Payer: Self-pay | Admitting: Endocrinology

## 2013-09-07 ENCOUNTER — Other Ambulatory Visit: Payer: Self-pay | Admitting: Endocrinology

## 2013-09-11 ENCOUNTER — Other Ambulatory Visit: Payer: 59

## 2013-09-11 ENCOUNTER — Other Ambulatory Visit (INDEPENDENT_AMBULATORY_CARE_PROVIDER_SITE_OTHER): Payer: 59

## 2013-09-11 ENCOUNTER — Other Ambulatory Visit: Payer: Self-pay

## 2013-09-11 DIAGNOSIS — E119 Type 2 diabetes mellitus without complications: Secondary | ICD-10-CM

## 2013-09-12 ENCOUNTER — Encounter: Payer: Self-pay | Admitting: Endocrinology

## 2013-09-12 LAB — HEMOGLOBIN A1C: HEMOGLOBIN A1C: 6.4 % (ref 4.6–6.5)

## 2013-09-14 ENCOUNTER — Other Ambulatory Visit: Payer: Self-pay | Admitting: Endocrinology

## 2013-09-14 MED ORDER — OMEGA-3-ACID ETHYL ESTERS 1 G PO CAPS: 2.0000 g | ORAL_CAPSULE | Freq: Two times a day (BID) | ORAL | Status: DC

## 2013-09-16 ENCOUNTER — Other Ambulatory Visit: Payer: Self-pay | Admitting: Endocrinology

## 2013-09-25 ENCOUNTER — Encounter: Payer: Self-pay | Admitting: Endocrinology

## 2013-11-09 ENCOUNTER — Encounter: Payer: Self-pay | Admitting: Endocrinology

## 2013-11-09 ENCOUNTER — Ambulatory Visit (INDEPENDENT_AMBULATORY_CARE_PROVIDER_SITE_OTHER): Payer: 59 | Admitting: Endocrinology

## 2013-11-09 VITALS — BP 122/70 | HR 74 | Temp 97.7°F | Ht 68.0 in | Wt 136.0 lb

## 2013-11-09 DIAGNOSIS — Z0001 Encounter for general adult medical examination with abnormal findings: Secondary | ICD-10-CM | POA: Insufficient documentation

## 2013-11-09 DIAGNOSIS — E039 Hypothyroidism, unspecified: Secondary | ICD-10-CM

## 2013-11-09 DIAGNOSIS — E119 Type 2 diabetes mellitus without complications: Secondary | ICD-10-CM

## 2013-11-09 DIAGNOSIS — D649 Anemia, unspecified: Secondary | ICD-10-CM

## 2013-11-09 DIAGNOSIS — E7800 Pure hypercholesterolemia, unspecified: Secondary | ICD-10-CM

## 2013-11-09 DIAGNOSIS — Z23 Encounter for immunization: Secondary | ICD-10-CM

## 2013-11-09 DIAGNOSIS — E291 Testicular hypofunction: Secondary | ICD-10-CM

## 2013-11-09 DIAGNOSIS — Z0000 Encounter for general adult medical examination without abnormal findings: Secondary | ICD-10-CM

## 2013-11-09 DIAGNOSIS — Z125 Encounter for screening for malignant neoplasm of prostate: Secondary | ICD-10-CM

## 2013-11-09 LAB — BASIC METABOLIC PANEL
BUN: 24 mg/dL — ABNORMAL HIGH (ref 6–23)
CALCIUM: 9.2 mg/dL (ref 8.4–10.5)
CO2: 28 mEq/L (ref 19–32)
Chloride: 102 mEq/L (ref 96–112)
Creatinine, Ser: 1.1 mg/dL (ref 0.4–1.5)
GFR: 72.54 mL/min (ref >60.00)
GLUCOSE: 105 mg/dL — AB (ref 70–99)
Potassium: 4 mEq/L (ref 3.5–5.1)
SODIUM: 138 meq/L (ref 135–145)

## 2013-11-09 LAB — LIPID PANEL
Cholesterol: 137 mg/dL (ref 0–200)
HDL: 62.8 mg/dL (ref >39.00)
LDL CALC: 64 mg/dL (ref 0–99)
NONHDL: 74.2
Total CHOL/HDL Ratio: 2
Triglycerides: 51 mg/dL (ref 0.0–149.0)
VLDL: 10.2 mg/dL (ref 0.0–40.0)

## 2013-11-09 LAB — CBC WITH DIFFERENTIAL/PLATELET
BASOS ABS: 0 10*3/uL (ref 0.0–0.1)
BASOS PCT: 0.3 % (ref 0.0–3.0)
EOS ABS: 0.1 10*3/uL (ref 0.0–0.7)
Eosinophils Relative: 1.3 % (ref 0.0–5.0)
HEMATOCRIT: 35.9 % — AB (ref 39.0–52.0)
HEMOGLOBIN: 11.9 g/dL — AB (ref 13.0–17.0)
LYMPHS ABS: 0.7 10*3/uL (ref 0.7–4.0)
Lymphocytes Relative: 10.3 % — ABNORMAL LOW (ref 12.0–46.0)
MCHC: 33.1 g/dL (ref 30.0–36.0)
MCV: 87.6 fl (ref 78.0–100.0)
Monocytes Absolute: 0.3 10*3/uL (ref 0.1–1.0)
Monocytes Relative: 4.6 % (ref 3.0–12.0)
NEUTROS ABS: 5.8 10*3/uL (ref 1.4–7.7)
Neutrophils Relative %: 83.5 % — ABNORMAL HIGH (ref 43.0–77.0)
Platelets: 160 10*3/uL (ref 150.0–400.0)
RBC: 4.1 Mil/uL — AB (ref 4.22–5.81)
RDW: 14.3 % (ref 11.5–15.5)
WBC: 7 10*3/uL (ref 4.0–10.5)

## 2013-11-09 LAB — PSA: PSA: 2.72 ng/mL (ref 0.10–4.00)

## 2013-11-09 LAB — HEPATIC FUNCTION PANEL
ALK PHOS: 46 U/L (ref 39–117)
ALT: 21 U/L (ref 0–53)
AST: 26 U/L (ref 0–37)
Albumin: 3.4 g/dL — ABNORMAL LOW (ref 3.5–5.2)
BILIRUBIN TOTAL: 0.6 mg/dL (ref 0.2–1.2)
Bilirubin, Direct: 0 mg/dL (ref 0.0–0.3)
Total Protein: 6.6 g/dL (ref 6.0–8.3)

## 2013-11-09 LAB — URINALYSIS, ROUTINE W REFLEX MICROSCOPIC
BILIRUBIN URINE: NEGATIVE
Hgb urine dipstick: NEGATIVE
Leukocytes, UA: NEGATIVE
Nitrite: NEGATIVE
SPECIFIC GRAVITY, URINE: 1.025 (ref 1.000–1.030)
Total Protein, Urine: NEGATIVE
UROBILINOGEN UA: 0.2 (ref 0.0–1.0)
Urine Glucose: NEGATIVE
pH: 5.5 (ref 5.0–8.0)

## 2013-11-09 LAB — IBC PANEL
Iron: 63 ug/dL (ref 42–165)
Saturation Ratios: 14.3 % — ABNORMAL LOW (ref 20.0–50.0)
Transferrin: 313.7 mg/dL (ref 212.0–360.0)

## 2013-11-09 LAB — HEMOGLOBIN A1C: HEMOGLOBIN A1C: 6.3 % (ref 4.6–6.5)

## 2013-11-09 LAB — MICROALBUMIN / CREATININE URINE RATIO
Creatinine,U: 181.5 mg/dL
MICROALB/CREAT RATIO: 0.8 mg/g (ref 0.0–30.0)
Microalb, Ur: 1.5 mg/dL (ref 0.0–1.9)

## 2013-11-09 LAB — TSH: TSH: 1.99 u[IU]/mL (ref 0.35–4.50)

## 2013-11-09 NOTE — Patient Instructions (Addendum)
please consider these measures for your health:  minimize alcohol.  do not use tobacco products.  have a colonoscopy at least every 10 years from age 89.  keep firearms safely stored.  always use seat belts.  have working smoke alarms in your home.  see an eye doctor and dentist regularly.  never drive under the influence of alcohol or drugs (including prescription drugs).  those with fair skin should take precautions against the sun.   blood tests are being requested for you today.  We'll contact you with results.   Please come back for a follow-up appointment in 6 months.

## 2013-11-09 NOTE — Progress Notes (Signed)
Subjective:    Patient ID: David Maynard, male    DOB: [birth year], 63 y.o.   MRN: [id number]  HPI Pt is here for regular wellness examination, and is feeling pretty well in general, and says chronic med probs are stable, except as noted below Past Medical History  Diagnosis Date  . Diabetes mellitus type II   . Hypercholesterolemia   . Thyroid disease   . Depressive disorder, not elsewhere classified   . Hypogonadism male   . Unspecified hypothyroidism     Past Surgical History  Procedure Laterality Date  . Rotator cuff repair      Right  . Knee arthroscopy      Left  . Knee arthroscopy w/ acl reconstruction      left  . Inguinal hernia repair      left  . Vasectomy  05/2002  . Esophagogastroduodenoscopy  06/16/2003  . Stress cardiolite  07/15/2000    History   Social History  . Marital Status: Married    Spouse Name: N/A    Number of Children: N/A  . Years of Education: N/A   Occupational History  . Not on file.   Social History Main Topics  . Smoking status: Never Smoker   . Smokeless tobacco: Not on file  . Alcohol Use: 0.6 oz/week    1 Cans of beer per week  . Drug Use: No  . Sexual Activity: Not on file   Other Topics Concern  . Not on file   Social History Narrative  . No narrative on file    Current Outpatient Prescriptions on File Prior to Visit  Medication Sig Dispense Refill  . ACCU-CHEK SMARTVIEW test strip Use to check blood sugar 6  per day.  600 each  1  . Ascorbic Acid (VITAMIN C) 500 MG tablet Take 500 mg by mouth daily.        . bromocriptine (PARLODEL) 2.5 MG tablet Take 0.5 tablets (1.25 mg total) by mouth daily.  45 tablet  3  . Cinnamon 500 MG capsule Take 500 mg by mouth 6 (six) times daily.       . clomiPHENE (CLOMID) 50 MG tablet TAKE 1/4 TABLET BY MOUTH DAILY  15 tablet  0  . glucosamine-chondroitin 500-400 MG tablet Take 1 tablet by mouth 2 (two) times daily.        . Insulin Pen Needle 32G X 5 MM MISC 1 each by Does not  apply route every morning.  100 each  1  . levothyroxine (SYNTHROID, LEVOTHROID) 50 MCG tablet Take 1 tablet by mouth  daily before breakfast.  90 tablet  1  . metFORMIN (GLUCOPHAGE-XR) 500 MG 24 hr tablet Take 2 tablets (1,000 mg total) by mouth 2 (two) times daily.  360 tablet  2  . Multiple Vitamin (MULTIVITAMIN) capsule Take 1 capsule by mouth daily.        Marland Kitchen. omega-3 acid ethyl esters (LOVAZA) 1 G capsule Take 2 capsules (2 g total) by mouth 2 (two) times daily.  360 capsule  3  . pioglitazone (ACTOS) 45 MG tablet Take 1 tablet by mouth  daily  90 tablet  1  . scopolamine (TRANSDERM-SCOP) 1 MG/3DAYS Place 1 patch (1.5 mg total) onto the skin every 3 (three) days.  4 patch  12  . sildenafil (REVATIO) 20 MG tablet 3-5 pills as needed for ED symptoms  30 tablet  0  . simvastatin (ZOCOR) 20 MG tablet Take 1 tablet by mouth at  bedtime  90 tablet  0  . VICTOZA 18 MG/3ML SOPN Inject subcutaneously 1.8mg  every morning  27 mL  2  . vitamin D, CHOLECALCIFEROL, 400 UNITS tablet Take 400 Units by mouth 2 (two) times daily.        . vitamin E 400 UNIT capsule Take 400 Units by mouth. Three times a week       No current facility-administered medications on file prior to visit.    No Known Allergies  Family History  Problem Relation Age of Onset  . Breast cancer Neg Hx   . Celiac disease Neg Hx   . Cirrhosis Neg Hx   . Clotting disorder Neg Hx   . Colitis Neg Hx   . Colon cancer Neg Hx   . Colon polyps Neg Hx   . Crohn's disease Neg Hx   . Cystic fibrosis Neg Hx   . Diabetes Neg Hx   . Esophageal cancer Neg Hx   . Heart disease Neg Hx   . Hemochromatosis Neg Hx   . Inflammatory bowel disease Neg Hx   . Irritable bowel syndrome Neg Hx   . Kidney disease Neg Hx   . Liver cancer Neg Hx   . Liver disease Neg Hx   . Ovarian cancer Neg Hx   . Pancreatic cancer Neg Hx   . Prostate cancer Neg Hx   . Rectal cancer Neg Hx   . Stomach cancer Neg Hx   . Ulcerative colitis Neg Hx   . Uterine  cancer Neg Hx   . Wilson's disease Neg Hx     BP 122/70  Pulse 74  Temp(Src) 97.7 F (36.5 C) (Oral)  Ht 5\' 8"  (1.727 m)  Wt 136 lb (61.689 kg)  BMI 20.68 kg/m2  SpO2 94%     Review of Systems  Constitutional: Negative for fever.       He has lost a few lbs  HENT: Negative for hearing loss.   Eyes: Negative for visual disturbance.  Respiratory: Negative for shortness of breath.   Cardiovascular: Negative for chest pain.  Gastrointestinal: Negative for anal bleeding.  Endocrine: Negative for cold intolerance.  Genitourinary: Negative for hematuria.  Musculoskeletal: Negative for back pain.  Skin: Negative for rash.  Allergic/Immunologic: Negative for environmental allergies.  Neurological: Negative for syncope and numbness.  Hematological: Does not bruise/bleed easily.  Psychiatric/Behavioral: Negative for dysphoric mood.       Objective:   Physical Exam VS: see vs page GEN: no distress HEAD: head: no deformity eyes: no periorbital swelling, no proptosis external nose and ears are normal mouth: no lesion seen NECK: supple, thyroid is not enlarged CHEST WALL: no deformity LUNGS: clear to auscultation BREASTS:  No gynecomastia CV: reg rate and rhythm, no murmur ABD: abdomen is soft, nontender.  no hepatosplenomegaly.  not distended.  no hernia.  RECTAL: normal external and internal exam.  heme neg. PROSTATE:  Normal size.  No nodule MUSCULOSKELETAL: muscle bulk and strength are grossly normal.  no obvious joint swelling.  gait is normal and steady EXTEMITIES: no deformity.  no ulcer on the feet.  feet are of normal color and temp.  no edema PULSES: dorsalis pedis intact bilat.  no carotid bruit NEURO:  cn 2-12 grossly intact.   readily moves all 4's.  sensation is intact to touch on the feet SKIN:  Normal texture and temperature.  No rash or suspicious lesion is visible.   NODES:  None palpable at the neck PSYCH: alert, well-oriented.  Does not appear anxious nor  depressed.     Assessment & Plan:  Wellness visit today, with problems stable, except as noted.

## 2013-11-10 ENCOUNTER — Telehealth: Payer: Self-pay | Admitting: Endocrinology

## 2013-11-10 ENCOUNTER — Other Ambulatory Visit: Payer: Self-pay | Admitting: Endocrinology

## 2013-11-10 NOTE — Telephone Encounter (Signed)
Pt wants to make sure his prescriptions were sent to [facility], thanks.

## 2013-11-11 ENCOUNTER — Other Ambulatory Visit: Payer: Self-pay

## 2013-11-11 MED ORDER — BROMOCRIPTINE MESYLATE 2.5 MG PO TABS: 1.2500 mg | ORAL_TABLET | Freq: Every day | ORAL | Status: DC

## 2013-11-11 MED ORDER — PIOGLITAZONE HCL 45 MG PO TABS: ORAL_TABLET | ORAL | Status: DC

## 2013-11-11 MED ORDER — SIMVASTATIN 20 MG PO TABS: ORAL_TABLET | ORAL | Status: DC

## 2013-11-11 MED ORDER — LEVOTHYROXINE SODIUM 50 MCG PO TABS: ORAL_TABLET | ORAL | Status: DC

## 2013-11-11 MED ORDER — METFORMIN HCL ER 500 MG PO TB24: 1000.0000 mg | ORAL_TABLET | Freq: Two times a day (BID) | ORAL | Status: DC

## 2013-11-11 NOTE — Telephone Encounter (Signed)
Rx sent to pharmacy   

## 2013-11-16 ENCOUNTER — Encounter: Payer: Self-pay | Admitting: Endocrinology

## 2013-12-13 ENCOUNTER — Encounter: Payer: Self-pay | Admitting: Endocrinology

## 2013-12-29 ENCOUNTER — Other Ambulatory Visit: Payer: Self-pay | Admitting: Endocrinology

## 2014-02-13 ENCOUNTER — Encounter: Payer: Self-pay | Admitting: Endocrinology

## 2014-02-15 ENCOUNTER — Other Ambulatory Visit: Payer: Self-pay | Admitting: Endocrinology

## 2014-02-15 DIAGNOSIS — E119 Type 2 diabetes mellitus without complications: Secondary | ICD-10-CM

## 2014-02-16 ENCOUNTER — Other Ambulatory Visit (INDEPENDENT_AMBULATORY_CARE_PROVIDER_SITE_OTHER): Payer: Self-pay

## 2014-02-16 DIAGNOSIS — E119 Type 2 diabetes mellitus without complications: Secondary | ICD-10-CM

## 2014-02-16 LAB — HEMOGLOBIN A1C: HEMOGLOBIN A1C: 6.7 % — AB (ref 4.6–6.5)

## 2014-02-17 ENCOUNTER — Encounter: Payer: Self-pay | Admitting: Endocrinology

## 2014-02-17 ENCOUNTER — Other Ambulatory Visit: Payer: Self-pay | Admitting: Endocrinology

## 2014-02-17 MED ORDER — CIPROFLOXACIN HCL 500 MG PO TABS
500.0000 mg | ORAL_TABLET | Freq: Two times a day (BID) | ORAL | Status: DC
Start: 2014-02-17 — End: 2014-05-24

## 2014-03-02 NOTE — Telephone Encounter (Signed)
error 

## 2014-03-04 ENCOUNTER — Telehealth: Payer: Self-pay | Admitting: Endocrinology

## 2014-03-04 MED ORDER — METFORMIN HCL ER 500 MG PO TB24: 1000.0000 mg | ORAL_TABLET | Freq: Two times a day (BID) | ORAL | Status: DC

## 2014-03-04 NOTE — Telephone Encounter (Signed)
Rx sent to pharmacy per pt's request.  

## 2014-03-04 NOTE — Telephone Encounter (Signed)
Patient called stating that he would like a 7 day Rx sent to his local pharmacy  [facility] is sending his metformin 500mg  2 tab twice daily   [facility] [address] [facility] [place]    Thank  [place]

## 2014-03-09 NOTE — Telephone Encounter (Signed)
error 

## 2014-03-24 ENCOUNTER — Other Ambulatory Visit: Payer: Self-pay | Admitting: Endocrinology

## 2014-03-26 ENCOUNTER — Telehealth: Payer: Self-pay | Admitting: Endocrinology

## 2014-03-26 NOTE — Telephone Encounter (Signed)
error 

## 2014-04-07 ENCOUNTER — Other Ambulatory Visit: Payer: Self-pay | Admitting: Endocrinology

## 2014-05-03 ENCOUNTER — Encounter: Payer: Self-pay | Admitting: Endocrinology

## 2014-05-03 ENCOUNTER — Other Ambulatory Visit: Payer: Self-pay | Admitting: Endocrinology

## 2014-05-03 DIAGNOSIS — E119 Type 2 diabetes mellitus without complications: Secondary | ICD-10-CM

## 2014-05-03 DIAGNOSIS — D649 Anemia, unspecified: Secondary | ICD-10-CM

## 2014-05-10 ENCOUNTER — Ambulatory Visit: Payer: 59 | Admitting: Endocrinology

## 2014-05-19 ENCOUNTER — Other Ambulatory Visit: Payer: Self-pay | Admitting: Endocrinology

## 2014-05-21 ENCOUNTER — Other Ambulatory Visit (INDEPENDENT_AMBULATORY_CARE_PROVIDER_SITE_OTHER): Payer: 59

## 2014-05-21 DIAGNOSIS — D649 Anemia, unspecified: Secondary | ICD-10-CM | POA: Diagnosis not present

## 2014-05-21 DIAGNOSIS — E119 Type 2 diabetes mellitus without complications: Secondary | ICD-10-CM | POA: Diagnosis not present

## 2014-05-21 LAB — CBC WITH DIFFERENTIAL/PLATELET
BASOS PCT: 0.4 % (ref 0.0–3.0)
Basophils Absolute: 0 10*3/uL (ref 0.0–0.1)
EOS ABS: 0.2 10*3/uL (ref 0.0–0.7)
Eosinophils Relative: 3.5 % (ref 0.0–5.0)
HEMATOCRIT: 34.8 % — AB (ref 39.0–52.0)
Hemoglobin: 11.8 g/dL — ABNORMAL LOW (ref 13.0–17.0)
Lymphocytes Relative: 24.4 % (ref 12.0–46.0)
Lymphs Abs: 1.2 10*3/uL (ref 0.7–4.0)
MCHC: 34 g/dL (ref 30.0–36.0)
MCV: 84.2 fl (ref 78.0–100.0)
MONO ABS: 0.3 10*3/uL (ref 0.1–1.0)
Monocytes Relative: 6.6 % (ref 3.0–12.0)
NEUTROS ABS: 3.2 10*3/uL (ref 1.4–7.7)
NEUTROS PCT: 65.1 % (ref 43.0–77.0)
PLATELETS: 163 10*3/uL (ref 150.0–400.0)
RBC: 4.13 Mil/uL — AB (ref 4.22–5.81)
RDW: 15.3 % (ref 11.5–15.5)
WBC: 4.9 10*3/uL (ref 4.0–10.5)

## 2014-05-21 LAB — IBC PANEL
Iron: 58 ug/dL (ref 42–165)
SATURATION RATIOS: 14.1 % — AB (ref 20.0–50.0)
TRANSFERRIN: 294 mg/dL (ref 212.0–360.0)

## 2014-05-21 LAB — HEMOGLOBIN A1C: Hgb A1c MFr Bld: 6.6 % — ABNORMAL HIGH (ref 4.6–6.5)

## 2014-05-24 ENCOUNTER — Ambulatory Visit (INDEPENDENT_AMBULATORY_CARE_PROVIDER_SITE_OTHER): Payer: Self-pay | Admitting: Endocrinology

## 2014-05-24 ENCOUNTER — Encounter: Payer: Self-pay | Admitting: Endocrinology

## 2014-05-24 VITALS — BP 114/76 | HR 69 | Temp 98.2°F | Ht 68.0 in | Wt 134.0 lb

## 2014-05-24 DIAGNOSIS — E291 Testicular hypofunction: Secondary | ICD-10-CM | POA: Diagnosis not present

## 2014-05-24 DIAGNOSIS — E119 Type 2 diabetes mellitus without complications: Secondary | ICD-10-CM | POA: Diagnosis not present

## 2014-05-24 NOTE — Patient Instructions (Addendum)
Please increase iron to 1 pill, once a day.   Take miralax as needed for constipation.  Please redo the blood tests in 3 months.  Please come back for an annual physical appointment in 6 months.

## 2014-05-24 NOTE — Progress Notes (Signed)
Subjective:    Patient ID: David Maynard, male    DOB: August 13, 1950, 64 y.o.   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  He takes fe 1/week.  Past Medical History  Diagnosis Date  . Diabetes mellitus type II   . Hypercholesterolemia   . Thyroid disease   . Depressive disorder, not elsewhere classified   . Hypogonadism male   . Unspecified hypothyroidism     Past Surgical History  Procedure Laterality Date  . Rotator cuff repair      Right  . Knee arthroscopy      Left  . Knee arthroscopy w/ acl reconstruction      left  . Inguinal hernia repair      left  . Vasectomy  05/2002  . Esophagogastroduodenoscopy  06/16/2003  . Stress cardiolite  07/15/2000    History   Social History  . Marital Status: Married    Spouse Name: N/A  . Number of Children: N/A  . Years of Education: N/A   Occupational History  . Not on file.   Social History Main Topics  . Smoking status: Never Smoker   . Smokeless tobacco: Not on file  . Alcohol Use: 0.6 oz/week    1 Cans of beer per week  . Drug Use: No  . Sexual Activity: Not on file   Other Topics Concern  . Not on file   Social History Narrative    Current Outpatient Prescriptions on File Prior to Visit  Medication Sig Dispense Refill  . ACCU-CHEK SMARTVIEW test strip Use to check blood sugar 6  times per day 600 each 1  . Ascorbic Acid (VITAMIN C) 500 MG tablet Take 500 mg by mouth daily.      . bromocriptine (PARLODEL) 2.5 MG tablet Take one-half tablet by  mouth daily 45 tablet 0  . clomiPHENE (CLOMID) 50 MG tablet TAKE 1/4 TABLET BY MOUTH EVERY DAY 15 tablet 0  .  glucosamine-chondroitin 500-400 MG tablet Take 1 tablet by mouth 2 (two) times daily.      . Insulin Pen Needle 32G X 5 MM MISC 1 each by Does not apply route every morning. 100 each 1  . levothyroxine (SYNTHROID, LEVOTHROID) 50 MCG tablet Take 1 tablet by mouth  daily before breakfast 90 tablet 1  . metFORMIN (GLUCOPHAGE-XR) 500 MG 24 hr tablet Take 2 tablets (1,000 mg total) by mouth 2 (two) times daily. 28 tablet 0  . Multiple Vitamin (MULTIVITAMIN) capsule Take 1 capsule by mouth daily.      Marland Kitchen. omega-3 acid ethyl esters (LOVAZA) 1 G capsule Take 2 capsules (2 g total) by mouth 2 (two) times daily. 360 capsule 3  . pioglitazone (ACTOS) 45 MG tablet Take 1 tablet by mouth  daily 90 tablet 0  . scopolamine (TRANSDERM-SCOP) 1 MG/3DAYS Place 1 patch (1.5 mg total) onto the skin every 3 (three) days. 4 patch 12  . sildenafil (REVATIO) 20 MG tablet 3-5 pills as needed for ED symptoms 30 tablet 0  . simvastatin (ZOCOR) 20 MG tablet Take 1 tablet by mouth at  bedtime 90 tablet 0  . VICTOZA  18 MG/3ML SOPN Inject subcutaneously 1.8mg  every morning 27 mL 1  . vitamin D, CHOLECALCIFEROL, 400 UNITS tablet Take 400 Units by mouth 2 (two) times daily.      . vitamin E 400 UNIT capsule Take 400 Units by mouth. Three times a week     No current facility-administered medications on file prior to visit.    No Known Allergies  Family History  Problem Relation Age of Onset  . Breast cancer Neg Hx   . Celiac disease Neg Hx   . Cirrhosis Neg Hx   . Clotting disorder Neg Hx   . Colitis Neg Hx   . Colon cancer Neg Hx   . Colon polyps Neg Hx   . Crohn's disease Neg Hx   . Cystic fibrosis Neg Hx   . Diabetes Neg Hx   . Esophageal cancer Neg Hx   . Heart disease Neg Hx   . Hemochromatosis Neg Hx   . Inflammatory bowel disease Neg Hx   . Irritable bowel syndrome Neg Hx   . Kidney disease Neg Hx   . Liver cancer Neg Hx   . Liver disease Neg Hx   . Ovarian cancer Neg Hx   . Pancreatic cancer Neg Hx   .  Prostate cancer Neg Hx   . Rectal cancer Neg Hx   . Stomach cancer Neg Hx   . Ulcerative colitis Neg Hx   . Uterine cancer Neg Hx   . Wilson's disease Neg Hx     BP 114/76 mmHg  Pulse 69  Temp(Src) 98.2 F (36.8 C) (Oral)  Ht  (1.727 m)  Wt 134 lb (60.782 kg)  BMI 20.38 kg/m2  SpO2 98%   Review of Systems Denies weight change.  He says fe tabs cause constipation    Objective:   Physical Exam VITAL SIGNS:  See vs page GENERAL: no distress Pulses: dorsalis pedis intact bilat.   MSK: no deformity of the feet CV: no leg edema Skin:  no ulcer on the feet.  normal color and temp on the feet. Neuro: sensation is intact to touch on the feet.    Lab Results  Component Value Date   WBC 4.9 05/21/2014   HGB 11.8* 05/21/2014   HCT 34.8* 05/21/2014   MCV 84.2 05/21/2014   PLT 163.0 05/21/2014   Lab Results  Component Value Date   HGBA1C 6.6* 05/21/2014      Assessment & Plan:  DM: well-controlled Anemia, due to donating blood, persistent Constipation, due to fe tabs.   Patient is advised the following: Patient Instructions  Please increase iron to 1 pill, once a day.   Take miralax as needed for constipation.  Please redo the blood tests in 3 months.  Please come back for an annual physical appointment in 6 months.    addendum: Please continue the same medications, for DM

## 2014-05-25 ENCOUNTER — Encounter: Payer: Self-pay | Admitting: Endocrinology

## 2014-06-08 ENCOUNTER — Encounter: Payer: Self-pay | Admitting: Endocrinology

## 2014-06-10 ENCOUNTER — Encounter: Payer: Self-pay | Admitting: Endocrinology

## 2014-06-10 ENCOUNTER — Other Ambulatory Visit (INDEPENDENT_AMBULATORY_CARE_PROVIDER_SITE_OTHER): Payer: 59

## 2014-06-10 ENCOUNTER — Other Ambulatory Visit: Payer: Self-pay | Admitting: Endocrinology

## 2014-06-10 DIAGNOSIS — E291 Testicular hypofunction: Secondary | ICD-10-CM

## 2014-06-10 DIAGNOSIS — D6489 Other specified anemias: Secondary | ICD-10-CM | POA: Diagnosis not present

## 2014-06-10 DIAGNOSIS — E119 Type 2 diabetes mellitus without complications: Secondary | ICD-10-CM | POA: Diagnosis not present

## 2014-06-11 LAB — CBC WITH DIFFERENTIAL/PLATELET
BASOS PCT: 1.1 % (ref 0.0–3.0)
Basophils Absolute: 0.1 10*3/uL (ref 0.0–0.1)
Eosinophils Absolute: 0.1 10*3/uL (ref 0.0–0.7)
Eosinophils Relative: 1.6 % (ref 0.0–5.0)
HCT: 38.3 % — ABNORMAL LOW (ref 39.0–52.0)
HEMOGLOBIN: 12.9 g/dL — AB (ref 13.0–17.0)
LYMPHS ABS: 0.7 10*3/uL (ref 0.7–4.0)
Lymphocytes Relative: 10.6 % — ABNORMAL LOW (ref 12.0–46.0)
MCHC: 33.6 g/dL (ref 30.0–36.0)
MCV: 85.8 fl (ref 78.0–100.0)
MONO ABS: 0.4 10*3/uL (ref 0.1–1.0)
Monocytes Relative: 6.5 % (ref 3.0–12.0)
Neutro Abs: 5.3 10*3/uL (ref 1.4–7.7)
Neutrophils Relative %: 80.2 % — ABNORMAL HIGH (ref 43.0–77.0)
Platelets: 166 10*3/uL (ref 150.0–400.0)
RBC: 4.46 Mil/uL (ref 4.22–5.81)
RDW: 15.7 % — ABNORMAL HIGH (ref 11.5–15.5)
WBC: 6.7 10*3/uL (ref 4.0–10.5)

## 2014-06-11 LAB — TESTOSTERONE: Testosterone: 270.44 ng/dL — ABNORMAL LOW (ref 300.00–890.00)

## 2014-06-11 LAB — IBC PANEL
Iron: 55 ug/dL (ref 42–165)
SATURATION RATIOS: 14 % — AB (ref 20.0–50.0)
TRANSFERRIN: 281 mg/dL (ref 212.0–360.0)

## 2014-06-11 LAB — HEMOGLOBIN A1C: Hgb A1c MFr Bld: 6.4 % (ref 4.6–6.5)

## 2014-06-12 ENCOUNTER — Other Ambulatory Visit: Payer: Self-pay | Admitting: Endocrinology

## 2014-06-12 MED ORDER — CLOMIPHENE CITRATE 50 MG PO TABS: ORAL_TABLET | ORAL | Status: DC

## 2014-06-13 ENCOUNTER — Encounter: Payer: Self-pay | Admitting: Endocrinology

## 2014-06-14 ENCOUNTER — Other Ambulatory Visit: Payer: Self-pay | Admitting: Endocrinology

## 2014-06-14 MED ORDER — CLOMIPHENE CITRATE 50 MG PO TABS: ORAL_TABLET | ORAL | Status: DC

## 2014-07-01 ENCOUNTER — Encounter: Payer: Self-pay | Admitting: Endocrinology

## 2014-07-05 ENCOUNTER — Other Ambulatory Visit: Payer: Self-pay | Admitting: Endocrinology

## 2014-07-05 DIAGNOSIS — N399 Disorder of urinary system, unspecified: Secondary | ICD-10-CM | POA: Insufficient documentation

## 2014-07-06 ENCOUNTER — Other Ambulatory Visit: Payer: Self-pay | Admitting: Endocrinology

## 2014-07-06 ENCOUNTER — Other Ambulatory Visit (INDEPENDENT_AMBULATORY_CARE_PROVIDER_SITE_OTHER): Payer: 59

## 2014-07-06 DIAGNOSIS — N399 Disorder of urinary system, unspecified: Secondary | ICD-10-CM

## 2014-07-06 DIAGNOSIS — E119 Type 2 diabetes mellitus without complications: Secondary | ICD-10-CM

## 2014-07-06 DIAGNOSIS — E291 Testicular hypofunction: Secondary | ICD-10-CM

## 2014-07-06 LAB — IBC PANEL
IRON: 63 ug/dL (ref 42–165)
SATURATION RATIOS: 16.2 % — AB (ref 20.0–50.0)
TRANSFERRIN: 278 mg/dL (ref 212.0–360.0)

## 2014-07-06 LAB — CBC WITH DIFFERENTIAL/PLATELET
BASOS ABS: 0 10*3/uL (ref 0.0–0.1)
Basophils Relative: 0.5 % (ref 0.0–3.0)
EOS ABS: 0.2 10*3/uL (ref 0.0–0.7)
EOS PCT: 3.7 % (ref 0.0–5.0)
HCT: 38 % — ABNORMAL LOW (ref 39.0–52.0)
Hemoglobin: 12.8 g/dL — ABNORMAL LOW (ref 13.0–17.0)
LYMPHS ABS: 1.1 10*3/uL (ref 0.7–4.0)
Lymphocytes Relative: 21.9 % (ref 12.0–46.0)
MCHC: 33.5 g/dL (ref 30.0–36.0)
MCV: 87.3 fl (ref 78.0–100.0)
MONO ABS: 0.3 10*3/uL (ref 0.1–1.0)
Monocytes Relative: 6 % (ref 3.0–12.0)
Neutro Abs: 3.5 10*3/uL (ref 1.4–7.7)
Neutrophils Relative %: 67.9 % (ref 43.0–77.0)
PLATELETS: 187 10*3/uL (ref 150.0–400.0)
RBC: 4.36 Mil/uL (ref 4.22–5.81)
RDW: 16 % — AB (ref 11.5–15.5)
WBC: 5.2 10*3/uL (ref 4.0–10.5)

## 2014-07-06 LAB — URINALYSIS, ROUTINE W REFLEX MICROSCOPIC
Bilirubin Urine: NEGATIVE
Hgb urine dipstick: NEGATIVE
KETONES UR: NEGATIVE
LEUKOCYTES UA: NEGATIVE
Nitrite: NEGATIVE
PH: 6 (ref 5.0–8.0)
RBC / HPF: NONE SEEN (ref 0–?)
SPECIFIC GRAVITY, URINE: 1.02 (ref 1.000–1.030)
Total Protein, Urine: NEGATIVE
UROBILINOGEN UA: 0.2 (ref 0.0–1.0)
Urine Glucose: NEGATIVE
WBC UA: NONE SEEN (ref 0–?)

## 2014-07-08 ENCOUNTER — Other Ambulatory Visit (INDEPENDENT_AMBULATORY_CARE_PROVIDER_SITE_OTHER): Payer: 59

## 2014-07-08 DIAGNOSIS — E291 Testicular hypofunction: Secondary | ICD-10-CM

## 2014-07-08 LAB — TESTOSTERONE: Testosterone: 293.32 ng/dL — ABNORMAL LOW (ref 300.00–890.00)

## 2014-07-12 LAB — HM DIABETES EYE EXAM

## 2014-07-16 ENCOUNTER — Encounter: Payer: Self-pay | Admitting: Endocrinology

## 2014-08-09 ENCOUNTER — Other Ambulatory Visit: Payer: Self-pay | Admitting: Endocrinology

## 2014-08-09 ENCOUNTER — Encounter: Payer: Self-pay | Admitting: Endocrinology

## 2014-08-09 MED ORDER — LEVOTHYROXINE SODIUM 50 MCG PO TABS: ORAL_TABLET | ORAL | Status: DC

## 2014-08-15 ENCOUNTER — Encounter: Payer: Self-pay | Admitting: Endocrinology

## 2014-08-16 ENCOUNTER — Encounter: Payer: Self-pay | Admitting: Endocrinology

## 2014-08-16 ENCOUNTER — Other Ambulatory Visit: Payer: Self-pay | Admitting: Endocrinology

## 2014-08-17 ENCOUNTER — Other Ambulatory Visit: Payer: Self-pay

## 2014-08-17 ENCOUNTER — Encounter: Payer: Self-pay | Admitting: Endocrinology

## 2014-08-17 MED ORDER — METFORMIN HCL ER 500 MG PO TB24: ORAL_TABLET | ORAL | Status: DC

## 2014-09-08 ENCOUNTER — Other Ambulatory Visit: Payer: Self-pay | Admitting: Endocrinology

## 2014-09-08 ENCOUNTER — Encounter: Payer: Self-pay | Admitting: Endocrinology

## 2014-09-08 DIAGNOSIS — E119 Type 2 diabetes mellitus without complications: Secondary | ICD-10-CM

## 2014-09-08 DIAGNOSIS — E291 Testicular hypofunction: Secondary | ICD-10-CM

## 2014-09-10 ENCOUNTER — Other Ambulatory Visit (INDEPENDENT_AMBULATORY_CARE_PROVIDER_SITE_OTHER): Payer: 59

## 2014-09-10 DIAGNOSIS — E119 Type 2 diabetes mellitus without complications: Secondary | ICD-10-CM | POA: Diagnosis not present

## 2014-09-10 DIAGNOSIS — E291 Testicular hypofunction: Secondary | ICD-10-CM

## 2014-09-10 LAB — TESTOSTERONE: Testosterone: 343 ng/dL (ref 300.00–890.00)

## 2014-09-10 LAB — HEMOGLOBIN A1C: Hgb A1c MFr Bld: 6.3 % (ref 4.6–6.5)

## 2014-09-13 ENCOUNTER — Telehealth: Payer: Self-pay | Admitting: Endocrinology

## 2014-09-13 NOTE — Telephone Encounter (Signed)
Pt would like lab results please

## 2014-09-13 NOTE — Telephone Encounter (Signed)
I contacted the pt and advised his a1c and testosterone labs were within normal range. Pt was advised results have been released to his my chart account.

## 2014-09-22 ENCOUNTER — Ambulatory Visit (INDEPENDENT_AMBULATORY_CARE_PROVIDER_SITE_OTHER): Payer: 59 | Admitting: Endocrinology

## 2014-09-22 ENCOUNTER — Telehealth: Payer: Self-pay | Admitting: Endocrinology

## 2014-09-22 ENCOUNTER — Encounter: Payer: Self-pay | Admitting: Endocrinology

## 2014-09-22 VITALS — BP 104/62 | HR 61 | Temp 97.0°F | Ht 68.0 in | Wt 132.0 lb

## 2014-09-22 DIAGNOSIS — E119 Type 2 diabetes mellitus without complications: Secondary | ICD-10-CM | POA: Diagnosis not present

## 2014-09-22 DIAGNOSIS — R42 Dizziness and giddiness: Secondary | ICD-10-CM

## 2014-09-22 DIAGNOSIS — D6489 Other specified anemias: Secondary | ICD-10-CM

## 2014-09-22 LAB — CBC WITH DIFFERENTIAL/PLATELET
BASOS ABS: 0 10*3/uL (ref 0.0–0.1)
Basophils Relative: 0.4 % (ref 0.0–3.0)
EOS ABS: 0 10*3/uL (ref 0.0–0.7)
Eosinophils Relative: 0.4 % (ref 0.0–5.0)
HCT: 37.7 % — ABNORMAL LOW (ref 39.0–52.0)
Hemoglobin: 12.5 g/dL — ABNORMAL LOW (ref 13.0–17.0)
LYMPHS ABS: 0.8 10*3/uL (ref 0.7–4.0)
LYMPHS PCT: 11.1 % — AB (ref 12.0–46.0)
MCHC: 33 g/dL (ref 30.0–36.0)
MCV: 91.2 fl (ref 78.0–100.0)
MONOS PCT: 4.2 % (ref 3.0–12.0)
Monocytes Absolute: 0.3 10*3/uL (ref 0.1–1.0)
NEUTROS PCT: 83.9 % — AB (ref 43.0–77.0)
Neutro Abs: 6.1 10*3/uL (ref 1.4–7.7)
PLATELETS: 176 10*3/uL (ref 150.0–400.0)
RBC: 4.13 Mil/uL — AB (ref 4.22–5.81)
RDW: 14.8 % (ref 11.5–15.5)
WBC: 7.3 10*3/uL (ref 4.0–10.5)

## 2014-09-22 LAB — IBC PANEL
Iron: 47 ug/dL (ref 42–165)
SATURATION RATIOS: 11.3 % — AB (ref 20.0–50.0)
TRANSFERRIN: 298 mg/dL (ref 212.0–360.0)

## 2014-09-22 LAB — BASIC METABOLIC PANEL
BUN: 23 mg/dL (ref 6–23)
CALCIUM: 9.2 mg/dL (ref 8.4–10.5)
CHLORIDE: 102 meq/L (ref 96–112)
CO2: 29 meq/L (ref 19–32)
Creatinine, Ser: 1.01 mg/dL (ref 0.40–1.50)
GFR: 78.99 mL/min (ref >60.00)
GLUCOSE: 131 mg/dL — AB (ref 70–99)
POTASSIUM: 4.2 meq/L (ref 3.5–5.1)
SODIUM: 137 meq/L (ref 135–145)

## 2014-09-22 LAB — TSH: TSH: 1.77 u[IU]/mL (ref 0.35–4.50)

## 2014-09-22 NOTE — Patient Instructions (Addendum)
blood tests are requested for you today.  We'll let you know about the results. Please continue the same medications. There are many possible causes of dizziness, none of which are obvoius today.  Usually, dizziness gets better on its own.  If not, please call back, and we'll do further testing.   Take it easy for next few days.   Let's check an "echo" (easy and painless heart test).

## 2014-09-22 NOTE — Telephone Encounter (Signed)
I contacted the pt and advised we have 1 pm appointment. Pt scheduled to come to see Dr. Everardo All at 1pm on 09/22/2014

## 2014-09-22 NOTE — Progress Notes (Signed)
Subjective:    Patient ID: David Maynard, male    DOB: 04-Feb-1950, 64 y.o.   MRN: [id number]  HPI Pt states 1 day of intermittent slight lightheadedness sensation in the head, but no assoc LOC.  sxs are somewhat worse with walking, but not necessarily so.  No vertigo. Past Medical History  Diagnosis Date  . Diabetes mellitus type II   . Hypercholesterolemia   . Thyroid disease   . Depressive disorder, not elsewhere classified   . Hypogonadism male   . Unspecified hypothyroidism     Past Surgical History  Procedure Laterality Date  . Rotator cuff repair      Right  . Knee arthroscopy      Left  . Knee arthroscopy w/ acl reconstruction      left  . Inguinal hernia repair      left  . Vasectomy  05/2002  . Esophagogastroduodenoscopy  06/16/2003  . Stress cardiolite  07/15/2000    Social History   Social History  . Marital Status: Married    Spouse Name: N/A  . Number of Children: N/A  . Years of Education: N/A   Occupational History  . Not on file.   Social History Main Topics  . Smoking status: Never Smoker   . Smokeless tobacco: Not on file  . Alcohol Use: 0.6 oz/week    1 Cans of beer per week  . Drug Use: No  . Sexual Activity: Not on file   Other Topics Concern  . Not on file   Social History Narrative    Current Outpatient Prescriptions on File Prior to Visit  Medication Sig Dispense Refill  . ACCU-CHEK SMARTVIEW test strip Use to check blood sugar 6  times per day 600 each 1  . Ascorbic Acid (VITAMIN C) 500 MG tablet Take 500 mg by mouth daily.      . bromocriptine (PARLODEL) 2.5 MG tablet Take one-half tablet by  mouth daily 45 tablet 4  . celecoxib (CELEBREX) 100 MG capsule Take 100 mg by mouth 2 (two) times daily.    . clomiPHENE (CLOMID) 50 MG tablet 1/2 tab daily 15 tablet 11  . glucosamine-chondroitin 500-400 MG tablet Take 1 tablet by mouth 2 (two) times daily.      . Insulin Pen Needle 32G X 5 MM MISC 1 each by Does not apply route  every morning. 100 each 1  . levothyroxine (SYNTHROID, LEVOTHROID) 50 MCG tablet Take 1 tablet by mouth  daily before breakfast 90 tablet 2  . metFORMIN (GLUCOPHAGE-XR) 500 MG 24 hr tablet Take 2 tablets by mouth two times daily 360 tablet 4  . Multiple Vitamin (MULTIVITAMIN) capsule Take 1 capsule by mouth daily.      Marland Kitchen omega-3 acid ethyl esters (LOVAZA) 1 G capsule Take 2 capsules by mouth  two times daily 360 capsule 2  . pioglitazone (ACTOS) 45 MG tablet Take 1 tablet by mouth  daily 90 tablet 4  . sildenafil (REVATIO) 20 MG tablet 3-5 pills as needed for ED symptoms 30 tablet 0  . simvastatin (ZOCOR) 20 MG tablet Take 1 tablet by mouth at  bedtime 90 tablet 4  . VICTOZA 18 MG/3ML SOPN Inject subcutaneously 1.8mg  every morning 27 mL 1  . vitamin D, CHOLECALCIFEROL, 400 UNITS tablet Take 400 Units by mouth 2 (two) times daily.      . vitamin E 400 UNIT capsule Take 400 Units by mouth. Three times a week     No current facility-administered  medications on file prior to visit.    No Known Allergies  Family History  Problem Relation Age of Onset  . Breast cancer Neg Hx   . Celiac disease Neg Hx   . Cirrhosis Neg Hx   . Clotting disorder Neg Hx   . Colitis Neg Hx   . Colon cancer Neg Hx   . Colon polyps Neg Hx   . Crohn's disease Neg Hx   . Cystic fibrosis Neg Hx   . Diabetes Neg Hx   . Esophageal cancer Neg Hx   . Heart disease Neg Hx   . Hemochromatosis Neg Hx   . Inflammatory bowel disease Neg Hx   . Irritable bowel syndrome Neg Hx   . Kidney disease Neg Hx   . Liver cancer Neg Hx   . Liver disease Neg Hx   . Ovarian cancer Neg Hx   . Pancreatic cancer Neg Hx   . Prostate cancer Neg Hx   . Rectal cancer Neg Hx   . Stomach cancer Neg Hx   . Ulcerative colitis Neg Hx   . Uterine cancer Neg Hx   . Wilson's disease Neg Hx     BP 104/62 mmHg  Pulse 61  Temp(Src) 97 F (36.1 C) (Oral)  Ht 5\' 8"  (1.727 m)  Wt 132 lb (59.875 kg)  BMI 20.08 kg/m2  SpO2 97%    Review  of Systems  Constitutional: Negative for fever and diaphoresis.  HENT: Negative for hearing loss.   Eyes: Negative for visual disturbance.  Respiratory: Negative for shortness of breath.   Cardiovascular: Negative for palpitations.  Gastrointestinal: Negative for blood in stool.  Endocrine: Negative for cold intolerance.  Genitourinary: Negative for hematuria.  Musculoskeletal: Negative for gait problem.  Skin: Negative for rash.  Neurological: Negative for headaches.  Psychiatric/Behavioral: Negative for decreased concentration.   He has fatigue.    Objective:   Physical Exam VS: see vs page.  i checked BP sitting and supine (both same as above) GEN: no distress HEAD: head: no deformity eyes: no periorbital swelling, no proptosis external nose and ears are normal mouth: no lesion seen Both eac's and tm's are normal.   NECK: supple, thyroid is not enlarged CHEST WALL: no deformity LUNGS: clear to auscultation CV: reg rate and rhythm, no murmur ABD: abdomen is soft, nontender.  no hepatosplenomegaly.  not distended.  no hernia MUSCULOSKELETAL: muscle bulk and strength are grossly normal.  no obvious joint swelling.  gait is normal and steady EXTEMITIES: no deformity.  no ulcer on the feet.  feet are of normal color and temp.  no edema PULSES: dorsalis pedis intact bilat.  no carotid bruit NEURO:  cn 2-12 grossly intact.   readily moves all 4's.  sensation is intact to touch on the feet SKIN:  Normal texture and temperature.  No rash or suspicious lesion is visible.   NODES:  None palpable at the neck PSYCH: alert, well-oriented.  Does not appear anxious nor depressed.  i personally reviewed electrocardiogram tracing (today):  Indication: dizziness Result: bifascicular block.     Assessment & Plan:  Dizziness, new, uncertain etiology Abnormal ecg: new  Patient is advised the following: Patient Instructions  blood tests are requested for you today.  We'll let you know  about the results. Please continue the same medications. There are many possible causes of dizziness, none of which are obvoius today.  Usually, dizziness gets better on its own.  If not, please call back, and we'll do  further testing.   Take it easy for next few days.   Let's check an "echo" (easy and painless heart test).

## 2014-09-22 NOTE — Telephone Encounter (Signed)
Patient called stating that he is very light headed and would like to See Dr. Everardo All today  He is leaving work due to this    Please advise   Call back [phone]

## 2014-10-04 ENCOUNTER — Ambulatory Visit (HOSPITAL_COMMUNITY): Payer: 59 | Attending: Cardiovascular Disease

## 2014-10-04 ENCOUNTER — Other Ambulatory Visit: Payer: Self-pay

## 2014-10-04 ENCOUNTER — Encounter: Payer: Self-pay | Admitting: Endocrinology

## 2014-10-04 DIAGNOSIS — E785 Hyperlipidemia, unspecified: Secondary | ICD-10-CM | POA: Diagnosis not present

## 2014-10-04 DIAGNOSIS — E039 Hypothyroidism, unspecified: Secondary | ICD-10-CM | POA: Diagnosis not present

## 2014-10-04 DIAGNOSIS — D649 Anemia, unspecified: Secondary | ICD-10-CM | POA: Diagnosis not present

## 2014-10-04 DIAGNOSIS — R42 Dizziness and giddiness: Secondary | ICD-10-CM | POA: Insufficient documentation

## 2014-10-04 DIAGNOSIS — E119 Type 2 diabetes mellitus without complications: Secondary | ICD-10-CM | POA: Diagnosis not present

## 2014-10-17 ENCOUNTER — Encounter: Payer: Self-pay | Admitting: Endocrinology

## 2014-10-18 ENCOUNTER — Other Ambulatory Visit: Payer: Self-pay

## 2014-10-18 MED ORDER — INSULIN PEN NEEDLE 32G X 5 MM MISC: 1.0000 | Freq: Every morning | Status: DC

## 2014-11-05 ENCOUNTER — Other Ambulatory Visit: Payer: Self-pay | Admitting: Endocrinology

## 2014-11-05 ENCOUNTER — Telehealth: Payer: Self-pay | Admitting: Endocrinology

## 2014-11-05 MED ORDER — LIRAGLUTIDE 18 MG/3ML ~~LOC~~ SOPN: PEN_INJECTOR | SUBCUTANEOUS | Status: DC

## 2014-11-05 NOTE — Telephone Encounter (Signed)
i have sent a prescription to your pharmacy 

## 2014-11-05 NOTE — Telephone Encounter (Signed)
Pt needs emergency refill on victoza 1 pen to hold him until the victoza comes from [facility]. [address] [facility] [address] please call pt once this has been done.

## 2014-11-05 NOTE — Telephone Encounter (Signed)
Please advise below.

## 2014-11-08 NOTE — Telephone Encounter (Signed)
Left pt a Vm to inform him that his Rx was sent to his pharmacy

## 2014-11-29 ENCOUNTER — Other Ambulatory Visit: Payer: Self-pay

## 2014-11-29 ENCOUNTER — Encounter: Payer: Self-pay | Admitting: Endocrinology

## 2014-11-29 ENCOUNTER — Ambulatory Visit (INDEPENDENT_AMBULATORY_CARE_PROVIDER_SITE_OTHER): Payer: 59 | Admitting: Endocrinology

## 2014-11-29 VITALS — BP 127/66 | HR 71 | Temp 97.9°F | Ht 68.0 in | Wt 136.0 lb

## 2014-11-29 DIAGNOSIS — E119 Type 2 diabetes mellitus without complications: Secondary | ICD-10-CM

## 2014-11-29 DIAGNOSIS — Z0000 Encounter for general adult medical examination without abnormal findings: Secondary | ICD-10-CM | POA: Diagnosis not present

## 2014-11-29 DIAGNOSIS — D6489 Other specified anemias: Secondary | ICD-10-CM | POA: Diagnosis not present

## 2014-11-29 DIAGNOSIS — Z125 Encounter for screening for malignant neoplasm of prostate: Secondary | ICD-10-CM | POA: Diagnosis not present

## 2014-11-29 LAB — POCT GLYCOSYLATED HEMOGLOBIN (HGB A1C): HEMOGLOBIN A1C: 6.2

## 2014-11-29 MED ORDER — CELECOXIB 100 MG PO CAPS: 100.0000 mg | ORAL_CAPSULE | Freq: Two times a day (BID) | ORAL | Status: DC

## 2014-11-29 NOTE — Progress Notes (Signed)
we discussed code status.  pt requests full code, but would not want to be started or maintained on artificial life-support measures if there was not a reasonable chance of recovery 

## 2014-11-29 NOTE — Progress Notes (Signed)
Subjective:    Patient ID: David Maynard, male    DOB: 1950-12-01, 64 y.o.   MRN: [id number]  HPI Pt is here for regular wellness examination, and is feeling pretty well in general, and says chronic med probs are stable, except as noted below Past Medical History  Diagnosis Date  . Diabetes mellitus type II   . Hypercholesterolemia   . Thyroid disease   . Depressive disorder, not elsewhere classified   . Hypogonadism male   . Unspecified hypothyroidism     Past Surgical History  Procedure Laterality Date  . Rotator cuff repair      Right  . Knee arthroscopy      Left  . Knee arthroscopy w/ acl reconstruction      left  . Inguinal hernia repair      left  . Vasectomy  05/2002  . Esophagogastroduodenoscopy  06/16/2003  . Stress cardiolite  07/15/2000    Social History   Social History  . Marital Status: Married    Spouse Name: N/A  . Number of Children: N/A  . Years of Education: N/A   Occupational History  . Not on file.   Social History Main Topics  . Smoking status: Never Smoker   . Smokeless tobacco: Not on file  . Alcohol Use: 0.6 oz/week    1 Cans of beer per week  . Drug Use: No  . Sexual Activity: Not on file   Other Topics Concern  . Not on file   Social History Narrative    Current Outpatient Prescriptions on File Prior to Visit  Medication Sig Dispense Refill  . ACCU-CHEK SMARTVIEW test strip Use to check blood sugar 6  times per day 600 each 1  . Ascorbic Acid (VITAMIN C) 500 MG tablet Take 500 mg by mouth daily.      . bromocriptine (PARLODEL) 2.5 MG tablet Take one-half tablet by  mouth daily 45 tablet 4  . clomiPHENE (CLOMID) 50 MG tablet 1/2 tab daily 15 tablet 11  . glucosamine-chondroitin 500-400 MG tablet Take 1 tablet by mouth 2 (two) times daily.      . Insulin Pen Needle 32G X 5 MM MISC 1 each by Does not apply route every morning. 100 each 1  . levothyroxine (SYNTHROID, LEVOTHROID) 50 MCG tablet Take 1 tablet by mouth  daily  before breakfast 90 tablet 2  . Liraglutide (VICTOZA) 18 MG/3ML SOPN Inject subcutaneously 1.8mg  every morning 3 mL 1  . metFORMIN (GLUCOPHAGE-XR) 500 MG 24 hr tablet Take 2 tablets by mouth two times daily 360 tablet 4  . Multiple Vitamin (MULTIVITAMIN) capsule Take 1 capsule by mouth daily.      Marland Kitchen omega-3 acid ethyl esters (LOVAZA) 1 G capsule Take 2 capsules by mouth  two times daily 360 capsule 2  . pioglitazone (ACTOS) 45 MG tablet Take 1 tablet by mouth  daily 90 tablet 4  . sildenafil (REVATIO) 20 MG tablet 3-5 pills as needed for ED symptoms 30 tablet 0  . simvastatin (ZOCOR) 20 MG tablet Take 1 tablet by mouth at  bedtime 90 tablet 4  . vitamin D, CHOLECALCIFEROL, 400 UNITS tablet Take 400 Units by mouth 2 (two) times daily.      . vitamin E 400 UNIT capsule Take 400 Units by mouth. Three times a week     No current facility-administered medications on file prior to visit.    No Known Allergies  Family History  Problem Relation Age of Onset  .  Breast cancer Neg Hx   . Celiac disease Neg Hx   . Cirrhosis Neg Hx   . Clotting disorder Neg Hx   . Colitis Neg Hx   . Colon cancer Neg Hx   . Colon polyps Neg Hx   . Crohn's disease Neg Hx   . Cystic fibrosis Neg Hx   . Diabetes Neg Hx   . Esophageal cancer Neg Hx   . Heart disease Neg Hx   . Hemochromatosis Neg Hx   . Inflammatory bowel disease Neg Hx   . Irritable bowel syndrome Neg Hx   . Kidney disease Neg Hx   . Liver cancer Neg Hx   . Liver disease Neg Hx   . Ovarian cancer Neg Hx   . Pancreatic cancer Neg Hx   . Prostate cancer Neg Hx   . Rectal cancer Neg Hx   . Stomach cancer Neg Hx   . Ulcerative colitis Neg Hx   . Uterine cancer Neg Hx   . Wilson's disease Neg Hx     BP 127/66 mmHg  Pulse 71  Temp(Src) 97.9 F (36.6 C) (Oral)  Ht 5\' 8"  (1.727 m)  Wt 136 lb (61.689 kg)  BMI 20.68 kg/m2  SpO2 97%  Review of Systems  Constitutional: Negative for fever and unexpected weight change.  HENT: Negative for  hearing loss.   Eyes: Negative for visual disturbance.  Gastrointestinal: Negative for anal bleeding.  Endocrine: Negative for cold intolerance.  Genitourinary: Negative for hematuria and difficulty urinating.  Musculoskeletal:       Chronic back pain persists  Skin: Negative for rash.  Allergic/Immunologic: Negative for environmental allergies.  Neurological: Negative for syncope and numbness.  Hematological: Does not bruise/bleed easily.  Psychiatric/Behavioral: Negative for dysphoric mood.       Objective:   Physical Exam VS: see vs page GEN: no distress HEAD: head: no deformity eyes: no periorbital swelling, no proptosis external nose and ears are normal mouth: no lesion seen NECK: supple, thyroid is not enlarged CHEST WALL: no deformity.  LUNGS: clear to auscultation BREASTS:  No gynecomastia.   CV: reg rate and rhythm, no murmur.  ABD: abdomen is soft, nontender.  no hepatosplenomegaly.  not distended.  no hernia.   RECTAL: normal external and internal exam.  heme neg.  PROSTATE:  Normal size.  No nodule.   MUSCULOSKELETAL: muscle bulk and strength are grossly normal.  no obvious joint swelling.  gait is normal and steady EXTEMITIES: no deformity.  no ulcer on the feet.  feet are of normal color and temp.  no edema PULSES: dorsalis pedis intact bilat.  no carotid bruit NEURO:  cn 2-12 grossly intact.   readily moves all 4's.  sensation is intact to touch on the feet SKIN:  Normal texture and temperature.  No rash or suspicious lesion is visible.   NODES:  None palpable at the neck.   PSYCH: alert, well-oriented.  Does not appear anxious nor depressed.        Assessment & Plan:  Wellness visit today, with problems stable, except as noted.

## 2014-11-29 NOTE — Patient Instructions (Addendum)
blood tests are requested for you today.  We'll let you know about the results.  please consider these measures for your health:  minimize alcohol.  do not use tobacco products.  have a colonoscopy at least every 10 years from age 64.  keep firearms safely stored.  always use seat belts.  have working smoke alarms in your home.  see an eye doctor and dentist regularly.  never drive under the influence of alcohol or drugs (including prescription drugs).  those with fair skin should take precautions against the sun. Please return in 1 year.

## 2014-12-01 ENCOUNTER — Encounter: Payer: Self-pay | Admitting: Endocrinology

## 2014-12-01 ENCOUNTER — Other Ambulatory Visit: Payer: Self-pay | Admitting: Endocrinology

## 2014-12-01 MED ORDER — SCOPOLAMINE 1 MG/3DAYS TD PT72: 1.0000 | MEDICATED_PATCH | TRANSDERMAL | Status: DC

## 2014-12-03 ENCOUNTER — Other Ambulatory Visit (INDEPENDENT_AMBULATORY_CARE_PROVIDER_SITE_OTHER): Payer: 59

## 2014-12-03 DIAGNOSIS — Z125 Encounter for screening for malignant neoplasm of prostate: Secondary | ICD-10-CM | POA: Diagnosis not present

## 2014-12-03 DIAGNOSIS — Z0000 Encounter for general adult medical examination without abnormal findings: Secondary | ICD-10-CM

## 2014-12-03 DIAGNOSIS — D6489 Other specified anemias: Secondary | ICD-10-CM | POA: Diagnosis not present

## 2014-12-03 DIAGNOSIS — E119 Type 2 diabetes mellitus without complications: Secondary | ICD-10-CM | POA: Diagnosis not present

## 2014-12-03 LAB — URINALYSIS, ROUTINE W REFLEX MICROSCOPIC
BILIRUBIN URINE: NEGATIVE
HGB URINE DIPSTICK: NEGATIVE
KETONES UR: NEGATIVE
LEUKOCYTES UA: NEGATIVE
Nitrite: NEGATIVE
PH: 5.5 (ref 5.0–8.0)
Specific Gravity, Urine: 1.02 (ref 1.000–1.030)
TOTAL PROTEIN, URINE-UPE24: NEGATIVE
URINE GLUCOSE: NEGATIVE
UROBILINOGEN UA: 0.2 (ref 0.0–1.0)

## 2014-12-03 LAB — CBC WITH DIFFERENTIAL/PLATELET
BASOS ABS: 0 10*3/uL (ref 0.0–0.1)
Basophils Relative: 0.5 % (ref 0.0–3.0)
EOS PCT: 1.9 % (ref 0.0–5.0)
Eosinophils Absolute: 0.1 10*3/uL (ref 0.0–0.7)
HEMATOCRIT: 39.8 % (ref 39.0–52.0)
Hemoglobin: 13.2 g/dL (ref 13.0–17.0)
LYMPHS PCT: 17.6 % (ref 12.0–46.0)
Lymphs Abs: 1.2 10*3/uL (ref 0.7–4.0)
MCHC: 33.1 g/dL (ref 30.0–36.0)
MCV: 91.2 fl (ref 78.0–100.0)
MONOS PCT: 5.5 % (ref 3.0–12.0)
Monocytes Absolute: 0.4 10*3/uL (ref 0.1–1.0)
NEUTROS ABS: 5.2 10*3/uL (ref 1.4–7.7)
Neutrophils Relative %: 74.5 % (ref 43.0–77.0)
PLATELETS: 175 10*3/uL (ref 150.0–400.0)
RBC: 4.37 Mil/uL (ref 4.22–5.81)
RDW: 13.4 % (ref 11.5–15.5)
WBC: 7 10*3/uL (ref 4.0–10.5)

## 2014-12-03 LAB — LIPID PANEL
Cholesterol: 150 mg/dL (ref 0–200)
HDL: 85.4 mg/dL (ref >39.00)
LDL Cholesterol: 56 mg/dL (ref 0–99)
NONHDL: 64.96
Total CHOL/HDL Ratio: 2
Triglycerides: 45 mg/dL (ref 0.0–149.0)
VLDL: 9 mg/dL (ref 0.0–40.0)

## 2014-12-03 LAB — IBC PANEL
Iron: 109 ug/dL (ref 42–165)
SATURATION RATIOS: 28.1 % (ref 20.0–50.0)
Transferrin: 277 mg/dL (ref 212.0–360.0)

## 2014-12-03 LAB — HEPATIC FUNCTION PANEL
ALBUMIN: 4.3 g/dL (ref 3.5–5.2)
ALK PHOS: 41 U/L (ref 39–117)
ALT: 20 U/L (ref 0–53)
AST: 29 U/L (ref 0–37)
Bilirubin, Direct: 0.1 mg/dL (ref 0.0–0.3)
TOTAL PROTEIN: 6.1 g/dL (ref 6.0–8.3)
Total Bilirubin: 0.4 mg/dL (ref 0.2–1.2)

## 2014-12-03 LAB — MICROALBUMIN / CREATININE URINE RATIO
Creatinine,U: 82.6 mg/dL
MICROALB/CREAT RATIO: 1 mg/g (ref 0.0–30.0)
Microalb, Ur: 0.8 mg/dL (ref 0.0–1.9)

## 2014-12-03 LAB — TSH: TSH: 1.82 u[IU]/mL (ref 0.35–4.50)

## 2014-12-03 LAB — PSA: PSA: 1.92 ng/mL (ref 0.10–4.00)

## 2015-01-27 ENCOUNTER — Other Ambulatory Visit: Payer: Self-pay | Admitting: Endocrinology

## 2015-01-27 ENCOUNTER — Encounter: Payer: Self-pay | Admitting: Gastroenterology

## 2015-01-31 ENCOUNTER — Other Ambulatory Visit: Payer: Self-pay

## 2015-01-31 MED ORDER — LIRAGLUTIDE 18 MG/3ML ~~LOC~~ SOPN: PEN_INJECTOR | SUBCUTANEOUS | Status: DC

## 2015-02-03 ENCOUNTER — Telehealth: Payer: Self-pay | Admitting: Endocrinology

## 2015-02-03 NOTE — Telephone Encounter (Signed)
Pt had flu shot at [facility] in November can we please update the system

## 2015-02-03 NOTE — Telephone Encounter (Signed)
Pt advised of note below and voiced understanding.  

## 2015-02-03 NOTE — Telephone Encounter (Signed)
See note below and please advise, Thanks! 

## 2015-02-03 NOTE — Telephone Encounter (Signed)
Pt received a update that he needs follow up for Hep C, can we advise on what this means is it a lab or a visit

## 2015-02-03 NOTE — Telephone Encounter (Signed)
The hep-c is recommended with you annual blood tests.  We'll do then, or sooner if you want.

## 2015-02-10 ENCOUNTER — Telehealth: Payer: Self-pay | Admitting: Endocrinology

## 2015-02-10 NOTE — Telephone Encounter (Signed)
Please see below.

## 2015-02-10 NOTE — Telephone Encounter (Signed)
Please put the form on my desk, and I'll fill out.

## 2015-02-10 NOTE — Telephone Encounter (Signed)
Patient stated that he faxed over a form [employer] health info form, he will pick it up when ready, let him know.

## 2015-02-11 DIAGNOSIS — Z7689 Persons encountering health services in other specified circumstances: Secondary | ICD-10-CM

## 2015-02-11 NOTE — Telephone Encounter (Signed)
Paper work placed on MD's desk

## 2015-02-11 NOTE — Telephone Encounter (Signed)
done

## 2015-02-11 NOTE — Telephone Encounter (Signed)
Pt notified. Forms placed upfront.

## 2015-02-16 ENCOUNTER — Encounter: Payer: Self-pay | Admitting: Endocrinology

## 2015-02-17 ENCOUNTER — Other Ambulatory Visit: Payer: Self-pay

## 2015-02-17 MED ORDER — LIRAGLUTIDE 18 MG/3ML ~~LOC~~ SOPN: PEN_INJECTOR | SUBCUTANEOUS | Status: DC

## 2015-03-03 ENCOUNTER — Other Ambulatory Visit (INDEPENDENT_AMBULATORY_CARE_PROVIDER_SITE_OTHER): Payer: 59

## 2015-03-03 DIAGNOSIS — E119 Type 2 diabetes mellitus without complications: Secondary | ICD-10-CM

## 2015-03-03 LAB — POCT GLYCOSYLATED HEMOGLOBIN (HGB A1C): HEMOGLOBIN A1C: 6.1

## 2015-03-28 ENCOUNTER — Other Ambulatory Visit: Payer: Self-pay | Admitting: Endocrinology

## 2015-03-30 ENCOUNTER — Other Ambulatory Visit: Payer: Self-pay | Admitting: Endocrinology

## 2015-05-27 ENCOUNTER — Other Ambulatory Visit: Payer: Self-pay | Admitting: Endocrinology

## 2015-06-04 ENCOUNTER — Encounter: Payer: Self-pay | Admitting: Endocrinology

## 2015-06-05 ENCOUNTER — Encounter: Payer: Self-pay | Admitting: Endocrinology

## 2015-06-06 ENCOUNTER — Other Ambulatory Visit (INDEPENDENT_AMBULATORY_CARE_PROVIDER_SITE_OTHER): Payer: 59

## 2015-06-06 ENCOUNTER — Other Ambulatory Visit: Payer: Self-pay | Admitting: Endocrinology

## 2015-06-06 DIAGNOSIS — E119 Type 2 diabetes mellitus without complications: Secondary | ICD-10-CM

## 2015-06-06 LAB — HEMOGLOBIN A1C: HEMOGLOBIN A1C: 6.9 % — AB (ref 4.6–6.5)

## 2015-06-06 MED ORDER — CELECOXIB 200 MG PO CAPS: 200.0000 mg | ORAL_CAPSULE | Freq: Every day | ORAL | Status: DC

## 2015-06-09 ENCOUNTER — Other Ambulatory Visit: Payer: Self-pay

## 2015-06-09 MED ORDER — CELECOXIB 200 MG PO CAPS: 200.0000 mg | ORAL_CAPSULE | Freq: Every day | ORAL | Status: DC

## 2015-06-11 ENCOUNTER — Encounter: Payer: Self-pay | Admitting: Endocrinology

## 2015-06-13 ENCOUNTER — Other Ambulatory Visit: Payer: Self-pay | Admitting: Endocrinology

## 2015-06-13 MED ORDER — COLESEVELAM HCL 625 MG PO TABS: 1250.0000 mg | ORAL_TABLET | Freq: Every day | ORAL | Status: DC

## 2015-06-22 ENCOUNTER — Other Ambulatory Visit: Payer: Self-pay | Admitting: Endocrinology

## 2015-06-22 ENCOUNTER — Encounter: Payer: Self-pay | Admitting: Endocrinology

## 2015-06-22 MED ORDER — COLESEVELAM HCL 625 MG PO TABS: 1250.0000 mg | ORAL_TABLET | Freq: Every day | ORAL | Status: DC

## 2015-07-03 ENCOUNTER — Other Ambulatory Visit: Payer: Self-pay | Admitting: Endocrinology

## 2015-07-12 ENCOUNTER — Other Ambulatory Visit: Payer: Self-pay | Admitting: Endocrinology

## 2015-07-28 ENCOUNTER — Other Ambulatory Visit: Payer: Self-pay | Admitting: Endocrinology

## 2015-07-28 NOTE — Telephone Encounter (Signed)
Rx submitted

## 2015-07-28 NOTE — Telephone Encounter (Signed)
90 day supply for this rx please with 3 refills

## 2015-08-25 ENCOUNTER — Other Ambulatory Visit: Payer: Self-pay | Admitting: Endocrinology

## 2015-08-26 ENCOUNTER — Other Ambulatory Visit: Payer: Self-pay

## 2015-08-26 MED ORDER — PIOGLITAZONE HCL 45 MG PO TABS: 45.0000 mg | ORAL_TABLET | Freq: Every day | ORAL | 0 refills | Status: DC

## 2015-08-26 MED ORDER — SIMVASTATIN 20 MG PO TABS: 20.0000 mg | ORAL_TABLET | Freq: Every day | ORAL | 0 refills | Status: DC

## 2015-08-26 MED ORDER — BROMOCRIPTINE MESYLATE 2.5 MG PO TABS: 1.2500 mg | ORAL_TABLET | Freq: Every day | ORAL | 0 refills | Status: DC

## 2015-08-29 ENCOUNTER — Other Ambulatory Visit: Payer: Self-pay

## 2015-08-29 MED ORDER — CLOMIPHENE CITRATE 50 MG PO TABS: 25.0000 mg | ORAL_TABLET | Freq: Every day | ORAL | 3 refills | Status: DC

## 2015-09-14 ENCOUNTER — Other Ambulatory Visit: Payer: Self-pay | Admitting: Endocrinology

## 2015-09-14 ENCOUNTER — Telehealth: Payer: Self-pay | Admitting: Endocrinology

## 2015-09-14 ENCOUNTER — Other Ambulatory Visit (INDEPENDENT_AMBULATORY_CARE_PROVIDER_SITE_OTHER): Payer: 59

## 2015-09-14 DIAGNOSIS — D6489 Other specified anemias: Secondary | ICD-10-CM

## 2015-09-14 DIAGNOSIS — E119 Type 2 diabetes mellitus without complications: Secondary | ICD-10-CM

## 2015-09-14 LAB — CBC WITH DIFFERENTIAL/PLATELET
BASOS PCT: 0.3 % (ref 0.0–3.0)
Basophils Absolute: 0 10*3/uL (ref 0.0–0.1)
EOS PCT: 2.9 % (ref 0.0–5.0)
Eosinophils Absolute: 0.2 10*3/uL (ref 0.0–0.7)
HCT: 35.8 % — ABNORMAL LOW (ref 39.0–52.0)
HEMOGLOBIN: 12.1 g/dL — AB (ref 13.0–17.0)
LYMPHS ABS: 1.3 10*3/uL (ref 0.7–4.0)
Lymphocytes Relative: 17.3 % (ref 12.0–46.0)
MCHC: 33.8 g/dL (ref 30.0–36.0)
MCV: 89.8 fl (ref 78.0–100.0)
MONO ABS: 0.4 10*3/uL (ref 0.1–1.0)
Monocytes Relative: 5.9 % (ref 3.0–12.0)
NEUTROS ABS: 5.5 10*3/uL (ref 1.4–7.7)
Neutrophils Relative %: 73.6 % (ref 43.0–77.0)
Platelets: 183 10*3/uL (ref 150.0–400.0)
RBC: 3.99 Mil/uL — ABNORMAL LOW (ref 4.22–5.81)
RDW: 13.6 % (ref 11.5–15.5)
WBC: 7.4 10*3/uL (ref 4.0–10.5)

## 2015-09-14 LAB — IBC PANEL
IRON: 68 ug/dL (ref 42–165)
Saturation Ratios: 17.8 % — ABNORMAL LOW (ref 20.0–50.0)
Transferrin: 273 mg/dL (ref 212.0–360.0)

## 2015-09-14 LAB — HEMOGLOBIN A1C: Hgb A1c MFr Bld: 6.3 % (ref 4.6–6.5)

## 2015-09-14 NOTE — Telephone Encounter (Signed)
I can put in a1c, but I need a dx in order to put in hemoglobin

## 2015-09-14 NOTE — Telephone Encounter (Signed)
Pt is asking for us to do an a1c and hemoglobin checked can we put in orders for him to have done

## 2015-09-14 NOTE — Telephone Encounter (Signed)
Attempted to reach the pt at 12 pm today, but pt was unavailable and did not have working vm. Pt will have labs added per Dr. Everardo AllEllison.

## 2015-09-14 NOTE — Telephone Encounter (Signed)
See note and please advise, Thanks!  

## 2015-09-23 LAB — HM DIABETES EYE EXAM

## 2015-10-13 ENCOUNTER — Encounter: Payer: Self-pay | Admitting: Endocrinology

## 2015-10-13 ENCOUNTER — Ambulatory Visit (INDEPENDENT_AMBULATORY_CARE_PROVIDER_SITE_OTHER): Payer: 59 | Admitting: Endocrinology

## 2015-10-13 VITALS — BP 100/56 | HR 75 | Ht 68.0 in | Wt 137.0 lb

## 2015-10-13 DIAGNOSIS — Z23 Encounter for immunization: Secondary | ICD-10-CM | POA: Diagnosis not present

## 2015-10-13 DIAGNOSIS — M542 Cervicalgia: Secondary | ICD-10-CM

## 2015-10-13 NOTE — Patient Instructions (Addendum)
Let';s check an MRI.  you will receive a phone call, about a day and time for an appointment. Please let me know if you want to see a urologist.   Please continue the same medication for diabetes.   Please come back for a regular physical appointment in 2 months.

## 2015-10-13 NOTE — Progress Notes (Signed)
Subjective:    Patient ID: David Maynard, male    DOB: 1950/12/06, 65 y.o.   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   Pt states 6 months of mild to moderate pain at the right posterior neck, but no assoc numbness.  No help with exercises, celebrex, and local heat.   Past Medical History:  Diagnosis Date  . Depressive disorder, not elsewhere classified   . Diabetes mellitus type II   . Hypercholesterolemia   . Hypogonadism male   . Thyroid disease   . Unspecified hypothyroidism     Past Surgical History:  Procedure Laterality Date  . ESOPHAGOGASTRODUODENOSCOPY  06/16/2003  . INGUINAL HERNIA REPAIR     left  . KNEE ARTHROSCOPY     Left  . KNEE ARTHROSCOPY W/ ACL RECONSTRUCTION     left  . ROTATOR CUFF REPAIR     Right  . Stress Cardiolite  07/15/2000  . VASECTOMY  05/2002    Social History   Social History  . Marital status: Married    Spouse name: N/A  . Number of children: N/A  . Years of education: N/A   Occupational History  . Not on file.   Social History Main Topics  . Smoking status: Never Smoker  . Smokeless tobacco: Not on file  . Alcohol use 0.6 oz/week    1 Cans of beer per week  . Drug use: No  . Sexual activity: Not on file   Other Topics Concern  . Not on file   Social History Narrative  . No narrative on file    Current Outpatient Prescriptions on File Prior to Visit  Medication Sig Dispense Refill  . ACCU-CHEK SMARTVIEW test strip Use to check blood sugar 6  times per day 600 each 2  . Ascorbic Acid (VITAMIN C) 500 MG tablet Take 500 mg by mouth daily.      . bromocriptine (PARLODEL) 2.5 MG tablet Take 0.5 tablets (1.25 mg  total) by mouth daily. 90 tablet 0  . celecoxib (CELEBREX) 200 MG capsule Take 1 capsule (200 mg total) by mouth daily. 90 capsule 3  . clomiPHENE (CLOMID) 50 MG tablet Take 0.5 tablets (25 mg total) by mouth daily. 45 tablet 3  . colesevelam (WELCHOL) 625 MG tablet Take 2 tablets (1,250 mg total) by mouth daily. 180 tablet 3  . glucosamine-chondroitin 500-400 MG tablet Take 1 tablet by mouth 2 (two) times daily.      . Insulin Pen Needle 32G X 5 MM MISC 1 each by Does not apply route every morning. 100 each 1  . levothyroxine (SYNTHROID, LEVOTHROID) 50 MCG tablet Take 1 tablet by mouth  daily before breakfast 90 tablet 1  . metFORMIN (GLUCOPHAGE-XR) 500 MG 24 hr tablet Take 2 tablets by mouth two times daily 360 tablet 1  . Multiple Vitamin (MULTIVITAMIN) capsule Take 1 capsule by mouth daily.      Marland Kitchen. omega-3 acid ethyl esters (LOVAZA) 1 g capsule Take 2 capsules by mouth  two times daily 360 capsule 2  . sildenafil (REVATIO) 20 MG tablet 3-5 pills as needed for ED symptoms 30 tablet 0  . VICTOZA 18 MG/3ML SOPN Inject  subcutaneously 1.8mg  every morning 18 mL 2  . vitamin D, CHOLECALCIFEROL, 400 UNITS tablet Take 400 Units by mouth 2 (two) times daily.      . vitamin E 400 UNIT capsule Take 400 Units by mouth. Three times a week     No current facility-administered medications on file prior to visit.     No Known Allergies  Family History  Problem Relation Age of Onset  . Breast cancer Neg Hx   . Celiac disease Neg Hx   . Cirrhosis Neg Hx   . Clotting disorder Neg Hx   . Colitis Neg Hx   . Colon cancer Neg Hx   . Colon polyps Neg Hx   . Crohn's disease Neg Hx   . Cystic fibrosis Neg Hx   . Diabetes Neg Hx   . Esophageal cancer Neg Hx   . Heart disease Neg Hx   . Hemochromatosis Neg Hx   . Inflammatory bowel disease Neg Hx   . Irritable bowel syndrome Neg Hx   . Kidney disease Neg Hx   . Liver cancer Neg Hx   . Liver disease Neg Hx   . Ovarian cancer Neg Hx   . Pancreatic  cancer Neg Hx   . Prostate cancer Neg Hx   . Rectal cancer Neg Hx   . Stomach cancer Neg Hx   . Ulcerative colitis Neg Hx   . Uterine cancer Neg Hx   . Wilson's disease Neg Hx     BP (!) 100/56   Pulse 75   Ht 5\' 8"  (1.727 m)   Wt 137 lb (62.1 kg)   BMI 20.83 kg/m    Review of Systems Denies headache.  He has nocturia despite qd cialis. He has chronic constipation.       Objective:   Physical Exam VITAL SIGNS:  See vs page.  GENERAL: no distress.  Neck: full ROM, but ROM is painful.     Lab Results  Component Value Date   HGBA1C 6.3 09/14/2015   Lab Results  Component Value Date   CREATININE 1.01 09/22/2014   BUN 23 09/22/2014   NA 137 09/22/2014   K 4.2 09/22/2014   CL 102 09/22/2014   CO2 29 09/22/2014      Assessment & Plan:  Type 2 DM: well-controlled, but he is prob developing type 1 Neck pain, new.  Nocturia, new, uncertain etiology

## 2015-10-14 ENCOUNTER — Other Ambulatory Visit: Payer: Self-pay | Admitting: Endocrinology

## 2015-10-18 ENCOUNTER — Encounter: Payer: Self-pay | Admitting: Endocrinology

## 2015-10-20 ENCOUNTER — Encounter: Payer: Self-pay | Admitting: Endocrinology

## 2015-10-20 NOTE — Telephone Encounter (Signed)
I contacted the patient and advised on 10/17/2015 we had received an appointment request for Dr. Everardo AllEllison to address some health maintenance concerns the patient had. Patient stated he only meant to advise us he had his diabetic eye exam done the first part of September and wanted it to be documented in his chart. Patient stated the report was normal and no diabatic retinopathy was seen. Patient's chart has been updated per request and appointment for 10/26/2015 has been cancelled.

## 2015-10-20 NOTE — Telephone Encounter (Signed)
Patient is confused about why he has an appointment 10/26/15. Please advise

## 2015-10-21 ENCOUNTER — Other Ambulatory Visit: Payer: Self-pay | Admitting: Endocrinology

## 2015-10-26 ENCOUNTER — Ambulatory Visit: Payer: 59 | Admitting: Endocrinology

## 2015-11-03 ENCOUNTER — Encounter: Payer: Self-pay | Admitting: Endocrinology

## 2015-11-03 ENCOUNTER — Ambulatory Visit
Admission: RE | Admit: 2015-11-03 | Discharge: 2015-11-03 | Disposition: A | Discharge status: Home / Self Care | Payer: Medicare Other | Source: Ambulatory Visit | Attending: Endocrinology | Admitting: Endocrinology

## 2015-11-03 DIAGNOSIS — M542 Cervicalgia: Secondary | ICD-10-CM

## 2015-11-28 ENCOUNTER — Ambulatory Visit: Payer: 59 | Admitting: Endocrinology

## 2015-11-28 ENCOUNTER — Ambulatory Visit (INDEPENDENT_AMBULATORY_CARE_PROVIDER_SITE_OTHER): Payer: Medicare Other | Admitting: Endocrinology

## 2015-11-28 ENCOUNTER — Encounter: Payer: Self-pay | Admitting: Endocrinology

## 2015-11-28 VITALS — BP 118/62 | HR 72 | Ht 68.0 in | Wt 140.0 lb

## 2015-11-28 DIAGNOSIS — Z23 Encounter for immunization: Secondary | ICD-10-CM | POA: Diagnosis not present

## 2015-11-28 DIAGNOSIS — R209 Unspecified disturbances of skin sensation: Secondary | ICD-10-CM | POA: Diagnosis not present

## 2015-11-28 DIAGNOSIS — Z125 Encounter for screening for malignant neoplasm of prostate: Secondary | ICD-10-CM | POA: Diagnosis not present

## 2015-11-28 DIAGNOSIS — E119 Type 2 diabetes mellitus without complications: Secondary | ICD-10-CM | POA: Diagnosis not present

## 2015-11-28 DIAGNOSIS — Z0000 Encounter for general adult medical examination without abnormal findings: Secondary | ICD-10-CM | POA: Diagnosis not present

## 2015-11-28 LAB — LIPID PANEL
CHOL/HDL RATIO: 2
Cholesterol: 157 mg/dL (ref 0–200)
HDL: 87.1 mg/dL (ref >39.00)
LDL Cholesterol: 47 mg/dL (ref 0–99)
NONHDL: 70.32
Triglycerides: 115 mg/dL (ref 0.0–149.0)
VLDL: 23 mg/dL (ref 0.0–40.0)

## 2015-11-28 LAB — CBC WITH DIFFERENTIAL/PLATELET
BASOS ABS: 0 10*3/uL (ref 0.0–0.1)
Basophils Relative: 0.3 % (ref 0.0–3.0)
EOS ABS: 0.2 10*3/uL (ref 0.0–0.7)
Eosinophils Relative: 2.9 % (ref 0.0–5.0)
HEMATOCRIT: 42 % (ref 39.0–52.0)
HEMOGLOBIN: 14.1 g/dL (ref 13.0–17.0)
LYMPHS PCT: 17.2 % (ref 12.0–46.0)
Lymphs Abs: 1.4 10*3/uL (ref 0.7–4.0)
MCHC: 33.7 g/dL (ref 30.0–36.0)
MCV: 90.2 fl (ref 78.0–100.0)
Monocytes Absolute: 0.5 10*3/uL (ref 0.1–1.0)
Monocytes Relative: 5.6 % (ref 3.0–12.0)
Neutro Abs: 6 10*3/uL (ref 1.4–7.7)
Neutrophils Relative %: 74 % (ref 43.0–77.0)
PLATELETS: 205 10*3/uL (ref 150.0–400.0)
RBC: 4.65 Mil/uL (ref 4.22–5.81)
RDW: 13.8 % (ref 11.5–15.5)
WBC: 8.1 10*3/uL (ref 4.0–10.5)

## 2015-11-28 LAB — URINALYSIS, ROUTINE W REFLEX MICROSCOPIC
Bilirubin Urine: NEGATIVE
HGB URINE DIPSTICK: NEGATIVE
LEUKOCYTES UA: NEGATIVE
Nitrite: NEGATIVE
RBC / HPF: NONE SEEN (ref 0–?)
Specific Gravity, Urine: 1.015 (ref 1.000–1.030)
TOTAL PROTEIN, URINE-UPE24: NEGATIVE
URINE GLUCOSE: NEGATIVE
UROBILINOGEN UA: 0.2 (ref 0.0–1.0)
WBC UA: NONE SEEN (ref 0–?)
pH: 5.5 (ref 5.0–8.0)

## 2015-11-28 LAB — HEPATIC FUNCTION PANEL
ALBUMIN: 4.2 g/dL (ref 3.5–5.2)
ALT: 17 U/L (ref 0–53)
AST: 21 U/L (ref 0–37)
Alkaline Phosphatase: 42 U/L (ref 39–117)
Bilirubin, Direct: 0.1 mg/dL (ref 0.0–0.3)
TOTAL PROTEIN: 6 g/dL (ref 6.0–8.3)
Total Bilirubin: 0.3 mg/dL (ref 0.2–1.2)

## 2015-11-28 LAB — PSA: PSA: 2.24 ng/mL (ref 0.10–4.00)

## 2015-11-28 LAB — BASIC METABOLIC PANEL
BUN: 40 mg/dL — AB (ref 6–23)
CALCIUM: 9.9 mg/dL (ref 8.4–10.5)
CO2: 32 mEq/L (ref 19–32)
CREATININE: 1.22 mg/dL (ref 0.40–1.50)
Chloride: 99 mEq/L (ref 96–112)
GFR: 63.29 mL/min (ref >60.00)
GLUCOSE: 118 mg/dL — AB (ref 70–99)
Potassium: 4.4 mEq/L (ref 3.5–5.1)
SODIUM: 138 meq/L (ref 135–145)

## 2015-11-28 LAB — MICROALBUMIN / CREATININE URINE RATIO
Creatinine,U: 143.8 mg/dL
MICROALB UR: 1.3 mg/dL (ref 0.0–1.9)
Microalb Creat Ratio: 0.9 mg/g (ref 0.0–30.0)

## 2015-11-28 LAB — TSH: TSH: 1.56 u[IU]/mL (ref 0.35–4.50)

## 2015-11-28 LAB — HEMOGLOBIN A1C: Hgb A1c MFr Bld: 6.4 % (ref 4.6–6.5)

## 2015-11-28 NOTE — Patient Instructions (Signed)
I have requested a referral to the neurosurgery specialist.  you will receive a phone call, about a day and time for an appointment.  If they say they cannot help you, I'll request the appointment with the PMR specialist.   Please consider these measures for your health:  minimize alcohol.  Do not use tobacco products.  Have a colonoscopy at least every 10 years from age 65.  Keep firearms safely stored.  Always use seat belts.  have working smoke alarms in your home.  See an eye doctor and dentist regularly.  Never drive under the influence of alcohol or drugs (including prescription drugs).  Those with fair skin should take precautions against the sun, and should carefully examine their skin once per month, for any new or changed moles. please let me know what your wishes would be, if artificial life support measures should become necessary.  It is critically important to prevent falling down (keep floor areas well-lit, dry, and free of loose objects.  If you have a cane, walker, or wheelchair, you should use it, even for short trips around the house.  Wear flat-soled shoes.  Also, try not to rush).   blood tests are requested for you today.  We'll let you know about the results. Please come back for a follow-up appointment in 6 months.

## 2015-11-28 NOTE — Progress Notes (Signed)
Subjective:    Patient ID: David Maynard, male    DOB: [birth year], 65 y.o.   MRN: [id number]  HPI Pt is here for regular wellness examination, and is feeling pretty well in general, and says chronic med probs are stable, except right posterior neck pain persists.  He has some numbness on the RUE, also.  Past Medical History:  Diagnosis Date  . Depressive disorder, not elsewhere classified   . Diabetes mellitus type II   . Hypercholesterolemia   . Hypogonadism male   . Thyroid disease   . Unspecified hypothyroidism     Past Surgical History:  Procedure Laterality Date  . ESOPHAGOGASTRODUODENOSCOPY  06/16/2003  . INGUINAL HERNIA REPAIR     left  . KNEE ARTHROSCOPY     Left  . KNEE ARTHROSCOPY W/ ACL RECONSTRUCTION     left  . ROTATOR CUFF REPAIR     Right  . Stress Cardiolite  07/15/2000  . VASECTOMY  05/2002    Social History   Social History  . Marital status: Married    Spouse name: N/A  . Number of children: N/A  . Years of education: N/A   Occupational History  . Not on file.   Social History Main Topics  . Smoking status: Never Smoker  . Smokeless tobacco: Not on file  . Alcohol use 0.6 oz/week    1 Cans of beer per week  . Drug use: No  . Sexual activity: Not on file   Other Topics Concern  . Not on file   Social History Narrative  . No narrative on file    Current Outpatient Prescriptions on File Prior to Visit  Medication Sig Dispense Refill  . ACCU-CHEK SMARTVIEW test strip Use to check blood sugar 6  times per day 600 each 2  . Ascorbic Acid (VITAMIN C) 500 MG tablet Take 500 mg by mouth daily.      . bromocriptine (PARLODEL) 2.5 MG tablet Take 0.5 tablets (1.25 mg total) by mouth daily. 90 tablet 0  . celecoxib (CELEBREX) 200 MG capsule Take 1 capsule (200 mg total) by mouth daily. (Patient taking differently: Take 200 mg by mouth as needed. ) 90 capsule 3  . clomiPHENE (CLOMID) 50 MG tablet Take 0.5 tablets (25 mg total) by mouth daily.  45 tablet 3  . colesevelam (WELCHOL) 625 MG tablet Take 2 tablets (1,250 mg total) by mouth daily. 180 tablet 3  . glucosamine-chondroitin 500-400 MG tablet Take 1 tablet by mouth 2 (two) times daily.      . Insulin Pen Needle 32G X 5 MM MISC 1 each by Does not apply route every morning. 100 each 1  . levothyroxine (SYNTHROID, LEVOTHROID) 50 MCG tablet Take 1 tablet by mouth  daily before breakfast 90 tablet 1  . metFORMIN (GLUCOPHAGE-XR) 500 MG 24 hr tablet Take 2 tablets by mouth two times daily 360 tablet 1  . Multiple Vitamin (MULTIVITAMIN) capsule Take 1 capsule by mouth daily.      Marland Kitchen. omega-3 acid ethyl esters (LOVAZA) 1 g capsule Take 2 capsules by mouth  two times daily 360 capsule 2  . pioglitazone (ACTOS) 45 MG tablet Take 1 tablet by mouth  daily 90 tablet 3  . sildenafil (REVATIO) 20 MG tablet 3-5 pills as needed for ED symptoms 30 tablet 0  . simvastatin (ZOCOR) 20 MG tablet Take 1 tablet by mouth at  bedtime (Patient taking differently: Take 1/2 tablet by mouth at  bedtime) 90 tablet 3  .  VICTOZA 18 MG/3ML SOPN INJECT SUBCUTANEOUSLY 1.8MG  EVERY MORNING 27 mL 11  . vitamin D, CHOLECALCIFEROL, 400 UNITS tablet Take 400 Units by mouth 2 (two) times daily.      . vitamin E 400 UNIT capsule Take 400 Units by mouth. Three times a week     No current facility-administered medications on file prior to visit.     No Known Allergies  Family History  Problem Relation Age of Onset  . Breast cancer Neg Hx   . Celiac disease Neg Hx   . Cirrhosis Neg Hx   . Clotting disorder Neg Hx   . Colitis Neg Hx   . Colon cancer Neg Hx   . Colon polyps Neg Hx   . Crohn's disease Neg Hx   . Cystic fibrosis Neg Hx   . Diabetes Neg Hx   . Esophageal cancer Neg Hx   . Heart disease Neg Hx   . Hemochromatosis Neg Hx   . Inflammatory bowel disease Neg Hx   . Irritable bowel syndrome Neg Hx   . Kidney disease Neg Hx   . Liver cancer Neg Hx   . Liver disease Neg Hx   . Ovarian cancer Neg Hx   .  Pancreatic cancer Neg Hx   . Prostate cancer Neg Hx   . Rectal cancer Neg Hx   . Stomach cancer Neg Hx   . Ulcerative colitis Neg Hx   . Uterine cancer Neg Hx   . Wilson's disease Neg Hx     BP 118/62   Pulse 72   Ht 5\' 8"  (1.727 m)   Wt 140 lb (63.5 kg)   SpO2 97%   BMI 21.29 kg/m     Review of Systems  Constitutional: Negative for fever.  HENT: Negative for hearing loss.   Eyes: Negative for visual disturbance.  Respiratory: Negative for shortness of breath.   Cardiovascular: Negative for chest pain.  Gastrointestinal: Negative for anal bleeding.  Endocrine: Negative for cold intolerance.  Genitourinary: Negative for difficulty urinating and hematuria.  Musculoskeletal: Negative for gait problem.  Skin: Negative for rash.  Allergic/Immunologic: Negative for environmental allergies.  Neurological: Negative for syncope and numbness.  Hematological: Does not bruise/bleed easily.  Psychiatric/Behavioral: Negative for dysphoric mood.      Objective:   Physical Exam VS: see vs page GEN: no distress HEAD: head: no deformity eyes: no periorbital swelling, no proptosis.  external nose and ears are normal mouth: no lesion seen.   NECK: supple, thyroid is not enlarged.  CHEST WALL: no deformity LUNGS: clear to auscultation BREASTS:  No gynecomastia CV: reg rate and rhythm, no murmur ABD: abdomen is soft, nontender.  no hepatosplenomegaly.  not distended.  no hernia RECTAL: normal external and internal exam.  heme neg. PROSTATE:  Normal size.  No nodule MUSCULOSKELETAL: muscle bulk and strength are grossly normal.  no obvious joint swelling.  gait is normal and steady EXTEMITIES: no deformity.  no ulcer on the feet.  feet are of normal color and temp.  no edema, but there are bilat vv's, and long toenails.  PULSES: dorsalis pedis intact bilat.  no carotid bruit NEURO:  cn 2-12 grossly intact.   readily moves all 4's.  sensation is intact to touch on the feet SKIN:   Normal texture and temperature.  No rash or suspicious lesion is visible.   NODES:  None palpable at the neck.   PSYCH: alert, well-oriented.  Does not appear anxious nor depressed.  i personally reviewed electrocardiogram tracing (today): Indication: DM Impression: RBBB    Assessment & Plan:  Wellness visit today, with problems stable, except as noted.   Patient is advised the following: Patient Instructions  I have requested a referral to the neurosurgery specialist.  you will receive a phone call, about a day and time for an appointment.  If they say they cannot help you, I'll request the appointment with the PMR specialist.   Please consider these measures for your health:  minimize alcohol.  Do not use tobacco products.  Have a colonoscopy at least every 10 years from age 44.  Keep firearms safely stored.  Always use seat belts.  have working smoke alarms in your home.  See an eye doctor and dentist regularly.  Never drive under the influence of alcohol or drugs (including prescription drugs).  Those with fair skin should take precautions against the sun, and should carefully examine their skin once per month, for any new or changed moles. please let me know what your wishes would be, if artificial life support measures should become necessary.  It is critically important to prevent falling down (keep floor areas well-lit, dry, and free of loose objects.  If you have a cane, walker, or wheelchair, you should use it, even for short trips around the house.  Wear flat-soled shoes.  Also, try not to rush).   blood tests are requested for you today.  We'll let you know about the results. Please come back for a follow-up appointment in 6 months.

## 2015-11-29 LAB — HIV ANTIBODY (ROUTINE TESTING W REFLEX): HIV 1&2 Ab, 4th Generation: NONREACTIVE

## 2015-11-29 LAB — HEPATITIS C ANTIBODY: HCV AB: NEGATIVE

## 2015-12-17 ENCOUNTER — Other Ambulatory Visit: Payer: Self-pay | Admitting: Endocrinology

## 2015-12-22 ENCOUNTER — Other Ambulatory Visit: Payer: Self-pay | Admitting: Endocrinology

## 2015-12-26 ENCOUNTER — Encounter: Payer: Self-pay | Admitting: Endocrinology

## 2015-12-27 ENCOUNTER — Other Ambulatory Visit: Payer: Self-pay | Admitting: Endocrinology

## 2015-12-27 MED ORDER — CELECOXIB 100 MG PO CAPS: 100.0000 mg | ORAL_CAPSULE | Freq: Two times a day (BID) | ORAL | 3 refills | Status: DC

## 2016-01-14 ENCOUNTER — Other Ambulatory Visit: Payer: Self-pay | Admitting: Endocrinology

## 2016-01-15 ENCOUNTER — Other Ambulatory Visit: Payer: Self-pay | Admitting: Endocrinology

## 2016-01-19 ENCOUNTER — Encounter: Payer: Self-pay | Admitting: Endocrinology

## 2016-01-24 ENCOUNTER — Ambulatory Visit (INDEPENDENT_AMBULATORY_CARE_PROVIDER_SITE_OTHER): Payer: Medicare Other | Admitting: Physician Assistant

## 2016-01-24 VITALS — BP 110/57 | HR 91 | Temp 100.4°F | Resp 18 | Ht 68.0 in | Wt 142.0 lb

## 2016-01-24 DIAGNOSIS — R69 Illness, unspecified: Secondary | ICD-10-CM | POA: Diagnosis not present

## 2016-01-24 DIAGNOSIS — J111 Influenza due to unidentified influenza virus with other respiratory manifestations: Secondary | ICD-10-CM

## 2016-01-24 MED ORDER — OSELTAMIVIR PHOSPHATE 75 MG PO CAPS: 75.0000 mg | ORAL_CAPSULE | Freq: Two times a day (BID) | ORAL | 0 refills | Status: DC

## 2016-01-24 NOTE — Progress Notes (Signed)
   01/24/2016 12:11 PM   DOB: Sep 07, 1950 / MRN: [id number]  SUBJECTIVE:  David Maynard is a 66 y.o. male presenting for 2 days of HA and chills.  Assoicates a mild cough. Complains of muscle aches. He did have the flu shot. Denies chest pain and SOB. He has been taking Nyquil with some relief.    He has No Known Allergies.   He  has a past medical history of Depressive disorder, not elsewhere classified; Diabetes mellitus type II; Hypercholesterolemia; Hypogonadism male; Thyroid disease; and Unspecified hypothyroidism.    He  reports that he has never smoked. He has never used smokeless tobacco. He reports that he drinks about 0.6 oz of alcohol per week . He reports that he does not use drugs. He  has no sexual activity history on file. The patient  has a past surgical history that includes Rotator cuff repair; Knee arthroscopy; Knee arthroscopy w/ ACL reconstruction; Inguinal hernia repair; Vasectomy (05/2002); Esophagogastroduodenoscopy (06/16/2003); and Stress Cardiolite (07/15/2000).  His family history is not on file.  Review of Systems  Constitutional: Positive for chills, fever and malaise/fatigue.  Respiratory: Positive for cough. Negative for hemoptysis, sputum production and shortness of breath.   Cardiovascular: Negative for chest pain.  Musculoskeletal: Positive for myalgias.  Neurological: Negative for dizziness.    The problem list and medications were reviewed and updated by myself where necessary and exist elsewhere in the encounter.   OBJECTIVE:  BP (!) 110/57   Pulse 91   Temp (!) 100.4 F (38 C) (Oral)   Resp 18   Ht 5\' 8"  (1.727 m)   Wt 142 lb (64.4 kg)   SpO2 95%   BMI 21.59 kg/m   Physical Exam  Constitutional: He is oriented to person, place, and time.  Non-toxic appearance. He appears ill.  Cardiovascular: Normal rate, regular rhythm and normal heart sounds.   Pulmonary/Chest: Effort normal and breath sounds normal. He has no rales.  Musculoskeletal:  Normal range of motion.  Neurological: He is alert and oriented to person, place, and time.  Skin: Skin is warm and dry. He is not diaphoretic.  Psychiatric: He has a normal mood and affect.    Lab Results  Component Value Date   HGBA1C 6.4 11/28/2015   Lab Results  Component Value Date   CREATININE 1.22 11/28/2015   Lab Results  Component Value Date   ALT 17 11/28/2015   AST 21 11/28/2015   ALKPHOS 42 11/28/2015   BILITOT 0.3 11/28/2015   ASSESSMENT AND PLAN  David Maynard was seen today for headache, cough and fever.  Diagnoses and all orders for this visit:  Influenza-like illness Comments: His symtpoms are consistent with the flu and his exam is reassuring.  He has had the flu shot.  I am treating for the flu. Tylenol 1g q8. RTC prn.  Orders: -     oseltamivir (TAMIFLU) 75 MG capsule; Take 1 capsule (75 mg total) by mouth 2 (two) times daily.    The patient is advised to call or return to clinic if he does not see an improvement in symptoms, or to seek the care of the closest emergency department if he worsens with the above plan.   Deliah BostonMichael Feras Gardella, MHS, PA-C [facility] 01/24/2016 12:11 PM

## 2016-01-24 NOTE — Patient Instructions (Addendum)
Take 1000 mg of Tylenol every 8 hours for pain.    IF you received an x-ray today, you will receive an invoice from [facility]. Please contact [facility] at [phone] with questions or concerns regarding your invoice.   IF you received labwork today, you will receive an invoice from [facility]. Please contact [facility] at [phone] with questions or concerns regarding your invoice.   Our billing staff will not be able to assist you with questions regarding bills from these companies.  You will be contacted with the lab results as soon as they are available. The fastest way to get your results is to activate your My Chart account. Instructions are located on the last page of this paperwork. If you have not heard from us regarding the results in 2 weeks, please contact this office.

## 2016-01-25 ENCOUNTER — Ambulatory Visit: Payer: Medicare Other | Admitting: Endocrinology

## 2016-02-01 ENCOUNTER — Encounter: Payer: Self-pay | Admitting: Endocrinology

## 2016-02-02 ENCOUNTER — Other Ambulatory Visit: Payer: Self-pay | Admitting: Endocrinology

## 2016-02-02 MED ORDER — SCOPOLAMINE 1 MG/3DAYS TD PT72: 1.0000 | MEDICATED_PATCH | TRANSDERMAL | 1 refills | Status: DC

## 2016-02-14 ENCOUNTER — Telehealth: Payer: Self-pay | Admitting: Endocrinology

## 2016-02-14 NOTE — Telephone Encounter (Signed)
Patient returning your call.pb

## 2016-02-14 NOTE — Telephone Encounter (Signed)
See message and please advise on how to proceed. Thanks!  

## 2016-02-14 NOTE — Telephone Encounter (Signed)
Pt called in and said that the Transderm-Scop script that was sent in is requiring a PA.  Pt is leaving to go on a cruise Sunday and is needing this to be done as soon as possible. He wants to know if it would be better to process the PA through [facility] or if it would be best to send it to a [facility].  Pt requests call back from [place] as soon as possible to discuss.  [facility] PA information: CB# [phone] [facility] [id number]

## 2016-02-14 NOTE — Telephone Encounter (Signed)
I can do PA, but it would be cheaper to just buy it without insurance.  i'll do PA if pt wants.

## 2016-02-15 ENCOUNTER — Telehealth: Payer: Self-pay | Admitting: Endocrinology

## 2016-02-15 MED ORDER — SCOPOLAMINE 1 MG/3DAYS TD PT72: 1.0000 | MEDICATED_PATCH | TRANSDERMAL | 1 refills | Status: DC

## 2016-02-15 NOTE — Telephone Encounter (Signed)
Patient ask youi to give him a call about the  scopolamine (TRANSDERM-SCOP, 1.5 MG,) 1 MG/3DAYS

## 2016-02-15 NOTE — Telephone Encounter (Signed)
I contacted the patient and advised of message via voicemail. Requested a call back to verify how the patient would like to proceed.

## 2016-02-15 NOTE — Telephone Encounter (Signed)
[facility] called and has clinical questions about the script for the Trans-Derm patch. CB # [phone] Ref # [id number]

## 2016-02-15 NOTE — Telephone Encounter (Signed)
I contacted the patient and he advised me the medication would be 70$ without insurance and he would like the PA to be completed. PA placed on your desk to review.

## 2016-02-15 NOTE — Telephone Encounter (Signed)
Patient has decided to get the transderm rx from his local pharmacy.

## 2016-02-15 NOTE — Telephone Encounter (Signed)
I contacted the patient and advised of message. Patient agreed to purchasing the med without insurance. [facility] [address]

## 2016-02-16 ENCOUNTER — Encounter: Payer: Self-pay | Admitting: Endocrinology

## 2016-02-16 DIAGNOSIS — T753XXA Motion sickness, initial encounter: Secondary | ICD-10-CM | POA: Insufficient documentation

## 2016-02-16 NOTE — Telephone Encounter (Signed)
done

## 2016-02-16 NOTE — Telephone Encounter (Signed)
PA submitted to [employer] 

## 2016-02-28 ENCOUNTER — Encounter: Payer: Self-pay | Admitting: Endocrinology

## 2016-02-28 ENCOUNTER — Ambulatory Visit (INDEPENDENT_AMBULATORY_CARE_PROVIDER_SITE_OTHER): Payer: Medicare Other | Admitting: Endocrinology

## 2016-02-28 VITALS — BP 116/70 | HR 75 | Ht 68.0 in | Wt 139.0 lb

## 2016-02-28 DIAGNOSIS — E119 Type 2 diabetes mellitus without complications: Secondary | ICD-10-CM

## 2016-02-28 LAB — BASIC METABOLIC PANEL
BUN: 32 mg/dL — ABNORMAL HIGH (ref 6–23)
CALCIUM: 9 mg/dL (ref 8.4–10.5)
CO2: 28 meq/L (ref 19–32)
CREATININE: 0.99 mg/dL (ref 0.40–1.50)
Chloride: 103 mEq/L (ref 96–112)
GFR: 80.48 mL/min (ref >60.00)
Glucose, Bld: 131 mg/dL — ABNORMAL HIGH (ref 70–99)
Potassium: 3.8 mEq/L (ref 3.5–5.1)
Sodium: 136 mEq/L (ref 135–145)

## 2016-02-28 LAB — POCT GLYCOSYLATED HEMOGLOBIN (HGB A1C): HEMOGLOBIN A1C: 6.2

## 2016-02-28 NOTE — Progress Notes (Signed)
Subjective:    Patient ID: David Maynard, male    DOB: [birth year], 66 y.o.   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  Pancreatitis: never.  Other: he has never taken insulin.   Interval history: he tolerates DM meds well.  pt states he feels well in general.    Past Medical History:  Diagnosis Date  . Depressive disorder, not elsewhere classified   . Diabetes mellitus type II   . Hypercholesterolemia   . Hypogonadism male   . Thyroid disease   . Unspecified hypothyroidism     Past Surgical History:  Procedure Laterality Date  . ESOPHAGOGASTRODUODENOSCOPY  06/16/2003  . INGUINAL HERNIA REPAIR     left  . KNEE ARTHROSCOPY     Left  . KNEE ARTHROSCOPY W/ ACL RECONSTRUCTION     left  . ROTATOR CUFF REPAIR     Right  . Stress Cardiolite  07/15/2000  . VASECTOMY  05/2002    Social History   Social History  . Marital status: Married    Spouse name: N/A  . Number of children: N/A  . Years of education: N/A   Occupational History  . Not on file.   Social History Main Topics  . Smoking status: Never Smoker  . Smokeless tobacco: Never Used  . Alcohol use 0.6 oz/week    1 Cans of beer per week  . Drug use: No  . Sexual activity: Not on file   Other Topics Concern  . Not on file   Social History Narrative  . No narrative on file    Current Outpatient Prescriptions on File Prior to Visit  Medication Sig Dispense Refill  . ACCU-CHEK SMARTVIEW test strip USE TO CHECK BLOOD SUGAR 6  TIMES PER DAY 540 each 3  . Ascorbic Acid (VITAMIN C) 500 MG tablet Take 500 mg by mouth daily.      . bromocriptine (PARLODEL) 2.5 MG tablet TAKE ONE-HALF TABLET BY  MOUTH DAILY 45 tablet 11  . celecoxib (CELEBREX) 100 MG capsule Take 1 capsule (100 mg  total) by mouth 2 (two) times daily. 90 capsule 3  . clomiPHENE (CLOMID) 50 MG tablet Take 0.5 tablets (25 mg total) by mouth daily. 45 tablet 3  . colesevelam (WELCHOL) 625 MG tablet Take 2 tablets (1,250 mg total) by mouth daily. 180 tablet 3  . glucosamine-chondroitin 500-400 MG tablet Take 1 tablet by mouth 2 (two) times daily.      . Insulin Pen Needle 32G X 5 MM MISC 1 each by Does not apply route every morning. 100 each 1  . levothyroxine (SYNTHROID, LEVOTHROID) 50 MCG tablet TAKE 1 TABLET BY MOUTH  DAILY BEFORE BREAKFAST 90 tablet 3  . metFORMIN (GLUCOPHAGE-XR) 500 MG 24 hr tablet TAKE 2 TABLETS BY MOUTH TWO TIMES DAILY 360 tablet 3  . Multiple Vitamin (MULTIVITAMIN) capsule Take 1 capsule by mouth daily.      Marland Kitchen omega-3 acid ethyl esters (LOVAZA) 1 g capsule TAKE 2 CAPSULES BY MOUTH  TWO TIMES DAILY 360 capsule 3  . oseltamivir (TAMIFLU) 75 MG capsule Take 1 capsule (75 mg total) by mouth 2 (two) times daily. 10 capsule 0  . pioglitazone (ACTOS) 45 MG tablet Take 1 tablet by mouth  daily 90 tablet 3  .  scopolamine (TRANSDERM-SCOP, 1.5 MG,) 1 MG/3DAYS Place 1 patch (1.5 mg total) onto the skin every 3 (three) days. 4 patch 1  . sildenafil (REVATIO) 20 MG tablet 3-5 pills as needed for ED symptoms 30 tablet 0  . simvastatin (ZOCOR) 20 MG tablet Take 1 tablet by mouth at  bedtime (Patient taking differently: Take 1/2 tablet by mouth at  bedtime) 90 tablet 3  . VICTOZA 18 MG/3ML SOPN INJECT SUBCUTANEOUSLY 1.8MG  EVERY MORNING 27 mL 11  . vitamin D, CHOLECALCIFEROL, 400 UNITS tablet Take 400 Units by mouth 2 (two) times daily.      . vitamin E 400 UNIT capsule Take 400 Units by mouth. Three times a week     No current facility-administered medications on file prior to visit.     No Known Allergies  Family History  Problem Relation Age of Onset  . Breast cancer Neg Hx   . Celiac disease Neg Hx   . Cirrhosis Neg Hx   . Clotting disorder Neg Hx   . Colitis Neg Hx   . Colon cancer Neg Hx    . Colon polyps Neg Hx   . Crohn's disease Neg Hx   . Cystic fibrosis Neg Hx   . Diabetes Neg Hx   . Esophageal cancer Neg Hx   . Heart disease Neg Hx   . Hemochromatosis Neg Hx   . Inflammatory bowel disease Neg Hx   . Irritable bowel syndrome Neg Hx   . Kidney disease Neg Hx   . Liver cancer Neg Hx   . Liver disease Neg Hx   . Ovarian cancer Neg Hx   . Pancreatic cancer Neg Hx   . Prostate cancer Neg Hx   . Rectal cancer Neg Hx   . Stomach cancer Neg Hx   . Ulcerative colitis Neg Hx   . Uterine cancer Neg Hx   . Wilson's disease Neg Hx     BP 116/70   Pulse 75   Ht 5\' 8"  (1.727 m)   Wt 139 lb (63 kg)   SpO2 97%   BMI 21.13 kg/m    Review of Systems He denies hypoglycemia.      Objective:   Physical Exam VITAL SIGNS:  See vs page GENERAL: no distress Pulses: dorsalis pedis intact bilat.   MSK: no deformity of the feet CV: no leg edema Skin:  no ulcer on the feet.  normal color and temp on the feet.  Neuro: sensation is intact to touch on the feet.   Lab Results  Component Value Date   CREATININE 0.99 02/28/2016   BUN 32 (H) 02/28/2016   NA 136 02/28/2016   K 3.8 02/28/2016   CL 103 02/28/2016   CO2 28 02/28/2016    A1c=6.2%     Assessment & Plan:  Insulin-requiring type 2 DM: stable.  Please continue the same medications.  Renal insuff: improved.  We discussed the importance of continuing to minimize celebrex.   Subjective:   Patient here for Medicare annual wellness visit and management of other chronic and acute problems.     Risk factors: advanced age    Roster of Physicians Providing Medical Care to Patient:  See "snapshot"   Activities of Daily Living: In your present state of health, do you have any difficulty performing the following activities?:  Preparing food and eating?: No  Bathing yourself: No  Getting dressed: No  Using the toilet:No  Moving around from place to place: No  In the past  year have you fallen or had a near  fall?:No    Home Safety: Has smoke detector and wears seat belts. No firearms. No excess sun exposure.    Diet and Exercise  Current exercise habits: pt says good Dietary issues discussed: pt reports a healthy diet   Depression Screen  Q1: Over the past two weeks, have you felt down, depressed or hopeless? no  Q2: Over the past two weeks, have you felt little interest or pleasure in doing things? no   The following portions of the patient's history were reviewed and updated as appropriate: allergies, current medications, past family history, past medical history, past social history, past surgical history and problem list.   Review of Systems  Denies hearing loss, and visual loss Objective:   Vision:  Curator, so he declines VA today.  Hearing: grossly normal Body mass index:  See vs page Msk: pt easily and quickly performs "get-up-and-go" from a sitting position Cognitive Impairment Assessment: cognition, memory and judgment appear normal.  remembers 2/3 at 5 minutes (? effort).  excellent recall.  can easily read and write a sentence. alert and oriented x 3.    Assessment:   Medicare wellness utd on preventive parameters.    Plan:   During the course of the visit the patient was educated and counseled about appropriate screening and preventive services including:         Fall prevention    Diabetes screening  Nutrition counseling   Vaccines / LABS Zostavax / Pneumococcal Vaccine today  PSA  Patient Instructions (the written plan) was given to the patient.

## 2016-02-28 NOTE — Patient Instructions (Addendum)
blood tests are requested for you today.  We'll let you know about the results.   Please consider these measures for your health:  minimize alcohol.  Do not use tobacco products.  Have a colonoscopy at least every 10 years from age 66.  Keep firearms safely stored.  Always use seat belts.  have working smoke alarms in your home.  See an eye doctor and dentist regularly.  Never drive under the influence of alcohol or drugs (including prescription drugs).  Those with fair skin should take precautions against the sun, and should carefully examine their skin once per month, for any new or changed moles. please let me know what your wishes would be, if artificial life support measures should become necessary.  It is critically important to prevent falling down (keep floor areas well-lit, dry, and free of loose objects.  If you have a cane, walker, or wheelchair, you should use it, even for short trips around the house.  Wear flat-soled shoes.  Also, try not to rush). good diet and exercise significantly improve the control of your diabetes.  please let me know if you wish to be referred to a dietician.  high blood sugar is very risky to your health.  you should see an eye doctor and dentist every year.  It is very important to get all recommended vaccinations.  Please come back for a follow-up appointment in 6 months.

## 2016-02-28 NOTE — Progress Notes (Signed)
we discussed code status.  pt requests full code, but would not want to be started or maintained on artificial life-support measures if there was not a reasonable chance of recovery 

## 2016-03-06 ENCOUNTER — Encounter: Payer: Self-pay | Admitting: Endocrinology

## 2016-03-08 ENCOUNTER — Other Ambulatory Visit: Payer: Self-pay | Admitting: Endocrinology

## 2016-03-09 ENCOUNTER — Other Ambulatory Visit: Payer: Self-pay | Admitting: Endocrinology

## 2016-03-09 ENCOUNTER — Other Ambulatory Visit: Payer: Self-pay

## 2016-03-14 ENCOUNTER — Encounter: Payer: Self-pay | Admitting: Endocrinology

## 2016-03-16 ENCOUNTER — Other Ambulatory Visit: Payer: Self-pay

## 2016-03-16 MED ORDER — COLESEVELAM HCL 625 MG PO TABS: 1250.0000 mg | ORAL_TABLET | Freq: Every day | ORAL | 3 refills | Status: DC

## 2016-04-16 ENCOUNTER — Encounter: Payer: Self-pay | Admitting: Endocrinology

## 2016-05-12 ENCOUNTER — Encounter: Payer: Self-pay | Admitting: Endocrinology

## 2016-05-14 ENCOUNTER — Encounter: Payer: Self-pay | Admitting: Endocrinology

## 2016-05-15 ENCOUNTER — Telehealth: Payer: Self-pay | Admitting: Endocrinology

## 2016-05-15 NOTE — Telephone Encounter (Signed)
Patient called to see about shingrix and getting booster again (see mychart email). Please advise.   Patient also needs another Acu Check due to his not working properly. Patient still has the test strips for the one he has so he would like to stick to the same kind.

## 2016-05-16 ENCOUNTER — Telehealth: Payer: Self-pay | Admitting: Endocrinology

## 2016-05-16 MED ORDER — ACCU-CHEK NANO SMARTVIEW W/DEVICE KIT: PACK | 0 refills | Status: DC

## 2016-05-16 NOTE — Telephone Encounter (Signed)
Pt called in and said that he was returning Megan's call.  Requests call back.

## 2016-05-16 NOTE — Addendum Note (Signed)
Addended by: Ann Maki T on: 05/16/2016 08:37 AM   Modules accepted: Orders

## 2016-05-16 NOTE — Telephone Encounter (Signed)
I contacted the patient and left a voicemail advising we have ordered the new shingles vaccine and we would contact him once we receive it. Pateint was also advised we have submitted his new accu-chek meter to the [facility] on [address].

## 2016-05-17 NOTE — Telephone Encounter (Signed)
I contacted the patient and advised we have ordered the shinrix vaccine. Patient advised we would call back once we have the immunization available in our clinic.

## 2016-05-22 ENCOUNTER — Encounter: Payer: Self-pay | Admitting: Endocrinology

## 2016-05-28 ENCOUNTER — Ambulatory Visit (INDEPENDENT_AMBULATORY_CARE_PROVIDER_SITE_OTHER): Payer: Medicare Other | Admitting: Endocrinology

## 2016-05-28 ENCOUNTER — Encounter: Payer: Self-pay | Admitting: Endocrinology

## 2016-05-28 VITALS — BP 122/60 | HR 70 | Ht 68.0 in | Wt 139.0 lb

## 2016-05-28 DIAGNOSIS — Z23 Encounter for immunization: Secondary | ICD-10-CM

## 2016-05-28 DIAGNOSIS — E119 Type 2 diabetes mellitus without complications: Secondary | ICD-10-CM | POA: Diagnosis not present

## 2016-05-28 LAB — POCT GLYCOSYLATED HEMOGLOBIN (HGB A1C): HEMOGLOBIN A1C: 6.3

## 2016-05-28 NOTE — Progress Notes (Signed)
Subjective:    Patient ID: David Maynard, male    DOB: [birth year], 66 y.o.   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   Dx'ed: 0923 Complications: renal insufficiency.   Therapy: victoza + 4 oral meds.  DKA: never Severe hypoglycemia: never.  Pancreatitis: never.  Other: he has never taken insulin.   Interval history: he tolerates DM meds well.  pt states he feels well in general.   Past Medical History:  Diagnosis Date  . Depressive disorder, not elsewhere classified   . Diabetes mellitus type II   . Hypercholesterolemia   . Hypogonadism male   . Thyroid disease   . Unspecified hypothyroidism     Past Surgical History:  Procedure Laterality Date  . ESOPHAGOGASTRODUODENOSCOPY  06/16/2003  . INGUINAL HERNIA REPAIR     left  . KNEE ARTHROSCOPY     Left  . KNEE ARTHROSCOPY W/ ACL RECONSTRUCTION     left  . ROTATOR CUFF REPAIR     Right  . Stress Cardiolite  07/15/2000  . VASECTOMY  05/2002    Social History   Social History  . Marital status: Married    Spouse name: N/A  . Number of children: N/A  . Years of education: N/A   Occupational History  . Not on file.   Social History Main Topics  . Smoking status: Never Smoker  . Smokeless tobacco: Never Used  . Alcohol use 0.6 oz/week    1 Cans of beer per week  . Drug use: No  . Sexual activity: Not on file   Other Topics Concern  . Not on file   Social History Narrative  . No narrative on file    Current Outpatient Prescriptions on File Prior to Visit  Medication Sig Dispense Refill  . ACCU-CHEK SMARTVIEW test strip USE TO CHECK BLOOD SUGAR 6  TIMES PER DAY 540 each 3  . Ascorbic Acid (VITAMIN C) 500 MG tablet Take 500 mg by mouth daily.      . Blood Glucose Monitoring Suppl (ACCU-CHEK NANO SMARTVIEW) w/Device KIT Use to check blood sugar 6 times per day. 1 kit 0  . bromocriptine  (PARLODEL) 2.5 MG tablet TAKE ONE-HALF TABLET BY  MOUTH DAILY 45 tablet 11  . clomiPHENE (CLOMID) 50 MG tablet Take 0.5 tablets (25 mg total) by mouth daily. 45 tablet 3  . colesevelam (WELCHOL) 625 MG tablet Take 2 tablets (1,250 mg total) by mouth daily. 180 tablet 3  . glucosamine-chondroitin 500-400 MG tablet Take 1 tablet by mouth 2 (two) times daily.      . Insulin Pen Needle 32G X 5 MM MISC 1 each by Does not apply route every morning. 100 each 1  . levothyroxine (SYNTHROID, LEVOTHROID) 50 MCG tablet TAKE 1 TABLET BY MOUTH  DAILY BEFORE BREAKFAST 90 tablet 3  . metFORMIN (GLUCOPHAGE-XR) 500 MG 24 hr tablet TAKE 2 TABLETS BY MOUTH TWO TIMES DAILY 360 tablet 3  . Multiple Vitamin (MULTIVITAMIN) capsule Take 1 capsule by mouth daily.      Marland Kitchen omega-3 acid ethyl esters (LOVAZA) 1 g capsule TAKE 2 CAPSULES BY MOUTH  TWO TIMES DAILY 360 capsule 3  . pioglitazone (ACTOS) 45 MG tablet Take 1 tablet by mouth  daily 90 tablet 3  . sildenafil (REVATIO) 20 MG tablet 3-5 pills as needed for ED symptoms 30 tablet 0  .  simvastatin (ZOCOR) 20 MG tablet Take 1 tablet by mouth at  bedtime (Patient taking differently: Take 1/2 tablet by mouth at  bedtime) 90 tablet 3  . TURMERIC PO Take by mouth.    Marland Kitchen VICTOZA 18 MG/3ML SOPN INJECT SUBCUTANEOUSLY 1.'8MG'$  EVERY MORNING 27 mL 11  . vitamin D, CHOLECALCIFEROL, 400 UNITS tablet Take 400 Units by mouth 2 (two) times daily.      . vitamin E 400 UNIT capsule Take 400 Units by mouth. Three times a week     No current facility-administered medications on file prior to visit.     No Known Allergies  Family History  Problem Relation Age of Onset  . Breast cancer Neg Hx   . Celiac disease Neg Hx   . Cirrhosis Neg Hx   . Clotting disorder Neg Hx   . Colitis Neg Hx   . Colon cancer Neg Hx   . Colon polyps Neg Hx   . Crohn's disease Neg Hx   . Cystic fibrosis Neg Hx   . Diabetes Neg Hx   . Esophageal cancer Neg Hx   . Heart disease Neg Hx   . Hemochromatosis Neg  Hx   . Inflammatory bowel disease Neg Hx   . Irritable bowel syndrome Neg Hx   . Kidney disease Neg Hx   . Liver cancer Neg Hx   . Liver disease Neg Hx   . Ovarian cancer Neg Hx   . Pancreatic cancer Neg Hx   . Prostate cancer Neg Hx   . Rectal cancer Neg Hx   . Stomach cancer Neg Hx   . Ulcerative colitis Neg Hx   . Uterine cancer Neg Hx   . Wilson's disease Neg Hx    BP 122/60   Pulse 70   Ht '5\' 8"'$  (1.727 m)   Wt 139 lb (63 kg)   SpO2 96%   BMI 21.13 kg/m   Review of Systems No weight change.     Objective:   Physical Exam VITAL SIGNS:  See vs page GENERAL: no distress.  Pulses: dorsalis pedis intact bilat.   MSK: no deformity of the feet.   CV: no leg edema, but there are bilat vv's.   Skin:  no ulcer on the feet.  normal color and temp on the feet.  Neuro: sensation is intact to touch on the feet.    a1c=6.3% Lab Results  Component Value Date   CREATININE 0.99 02/28/2016   BUN 32 (H) 02/28/2016   NA 136 02/28/2016   K 3.8 02/28/2016   CL 103 02/28/2016   CO2 28 02/28/2016      Assessment & Plan:  Insulin-requiring type 2 DM: well-controlled.   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  We'll follow.  At this level, it doesn't limit rx options.   Patient Instructions  Please continue the same medications.   Please come back for a follow-up appointment in 3-4 months.   check your blood sugar once a day.  vary the time of day when you check, between before the 3 meals, and at bedtime.  also check if you have symptoms of your blood sugar being too high or too low.  please keep a record of the readings and bring it to your next appointment here (or you can bring the meter itself).  You can write it on any piece of paper.  please call us sooner if your blood sugar goes below 70, or  if you have a lot of readings over 200.

## 2016-05-28 NOTE — Patient Instructions (Signed)
Please continue the same medications.   Please come back for a follow-up appointment in 3-4 months.   check your blood sugar once a day.  vary the time of day when you check, between before the 3 meals, and at bedtime.  also check if you have symptoms of your blood sugar being too high or too low.  please keep a record of the readings and bring it to your next appointment here (or you can bring the meter itself).  You can write it on any piece of paper.  please call us sooner if your blood sugar goes below 70, or if you have a lot of readings over 200.

## 2016-07-25 ENCOUNTER — Other Ambulatory Visit: Payer: Self-pay | Admitting: Endocrinology

## 2016-08-20 ENCOUNTER — Encounter: Payer: Self-pay | Admitting: Endocrinology

## 2016-09-02 ENCOUNTER — Encounter: Payer: Self-pay | Admitting: Endocrinology

## 2016-09-03 ENCOUNTER — Ambulatory Visit (INDEPENDENT_AMBULATORY_CARE_PROVIDER_SITE_OTHER): Payer: Medicare Other | Admitting: Endocrinology

## 2016-09-03 ENCOUNTER — Encounter: Payer: Self-pay | Admitting: Endocrinology

## 2016-09-03 VITALS — BP 122/62 | HR 71 | Ht 68.0 in | Wt 136.0 lb

## 2016-09-03 DIAGNOSIS — Z23 Encounter for immunization: Secondary | ICD-10-CM

## 2016-09-03 DIAGNOSIS — E119 Type 2 diabetes mellitus without complications: Secondary | ICD-10-CM | POA: Diagnosis not present

## 2016-09-03 LAB — POCT GLYCOSYLATED HEMOGLOBIN (HGB A1C): HEMOGLOBIN A1C: 6.2

## 2016-09-03 NOTE — Progress Notes (Signed)
Subjective:    Patient ID: David Maynard, male    DOB: [birth year], 66 y.o.   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   Dx'ed: 5329 Complications: renal insufficiency.   Therapy: victoza + 4 oral meds.  DKA: never Severe hypoglycemia: never.  Pancreatitis: never.  Other: he has never taken insulin.   Interval history: he tolerates DM meds well.  pt states he feels well in general.  He brings a record of his cbg's which I have reviewed today. All are in the low to mid-100's.   Past Medical History:  Diagnosis Date  . Depressive disorder, not elsewhere classified   . Diabetes mellitus type II   . Hypercholesterolemia   . Hypogonadism male   . Thyroid disease   . Unspecified hypothyroidism     Past Surgical History:  Procedure Laterality Date  . ESOPHAGOGASTRODUODENOSCOPY  06/16/2003  . INGUINAL HERNIA REPAIR     left  . KNEE ARTHROSCOPY     Left  . KNEE ARTHROSCOPY W/ ACL RECONSTRUCTION     left  . ROTATOR CUFF REPAIR     Right  . Stress Cardiolite  07/15/2000  . VASECTOMY  05/2002    Social History   Social History  . Marital status: Married    Spouse name: N/A  . Number of children: N/A  . Years of education: N/A   Occupational History  . Not on file.   Social History Main Topics  . Smoking status: Never Smoker  . Smokeless tobacco: Never Used  . Alcohol use 0.6 oz/week    1 Cans of beer per week  . Drug use: No  . Sexual activity: Not on file   Other Topics Concern  . Not on file   Social History Narrative  . No narrative on file    Current Outpatient Prescriptions on File Prior to Visit  Medication Sig Dispense Refill  . ACCU-CHEK SMARTVIEW test strip USE TO CHECK BLOOD SUGAR 6  TIMES PER DAY 540 each 3  . Ascorbic Acid (VITAMIN C) 500 MG tablet Take 500 mg by mouth daily.      . Blood Glucose Monitoring Suppl (ACCU-CHEK NANO  SMARTVIEW) w/Device KIT Use to check blood sugar 6 times per day. 1 kit 0  . bromocriptine (PARLODEL) 2.5 MG tablet TAKE ONE-HALF TABLET BY  MOUTH DAILY 45 tablet 11  . clomiPHENE (CLOMID) 50 MG tablet Take 0.5 tablets (25 mg total) by mouth daily. 45 tablet 3  . colesevelam (WELCHOL) 625 MG tablet Take 2 tablets (1,250 mg total) by mouth daily. 180 tablet 3  . glucosamine-chondroitin 500-400 MG tablet Take 1 tablet by mouth 2 (two) times daily.      . Insulin Pen Needle 32G X 5 MM MISC 1 each by Does not apply route every morning. 100 each 1  . levothyroxine (SYNTHROID, LEVOTHROID) 50 MCG tablet TAKE 1 TABLET BY MOUTH  DAILY BEFORE BREAKFAST 90 tablet 3  . metFORMIN (GLUCOPHAGE-XR) 500 MG 24 hr tablet TAKE 2 TABLETS BY MOUTH TWO TIMES DAILY 360 tablet 3  . Multiple Vitamin (MULTIVITAMIN) capsule Take 1 capsule by mouth daily.      Marland Kitchen omega-3 acid ethyl esters (LOVAZA) 1 g capsule TAKE 2 CAPSULES BY MOUTH  TWO TIMES DAILY 360 capsule 3  . pioglitazone (ACTOS) 45 MG tablet TAKE 1 TABLET BY MOUTH  DAILY  90 tablet 3  . sildenafil (REVATIO) 20 MG tablet 3-5 pills as needed for ED symptoms 30 tablet 0  . simvastatin (ZOCOR) 20 MG tablet Take 1 tablet by mouth at  bedtime (Patient taking differently: Take 1/2 tablet by mouth at  bedtime) 90 tablet 3  . TURMERIC PO Take 1 tablet by mouth 2 (two) times daily.     Marland Kitchen VICTOZA 18 MG/3ML SOPN INJECT SUBCUTANEOUSLY 1.8MG EVERY MORNING 27 mL 11  . vitamin D, CHOLECALCIFEROL, 400 UNITS tablet Take 400 Units by mouth 2 (two) times daily.      . vitamin E 400 UNIT capsule Take 400 Units by mouth. Three times a week     No current facility-administered medications on file prior to visit.     No Known Allergies  Family History  Problem Relation Age of Onset  . Breast cancer Neg Hx   . Celiac disease Neg Hx   . Cirrhosis Neg Hx   . Clotting disorder Neg Hx   . Colitis Neg Hx   . Colon cancer Neg Hx   . Colon polyps Neg Hx   . Crohn's disease Neg Hx   .  Cystic fibrosis Neg Hx   . Diabetes Neg Hx   . Esophageal cancer Neg Hx   . Heart disease Neg Hx   . Hemochromatosis Neg Hx   . Inflammatory bowel disease Neg Hx   . Irritable bowel syndrome Neg Hx   . Kidney disease Neg Hx   . Liver cancer Neg Hx   . Liver disease Neg Hx   . Ovarian cancer Neg Hx   . Pancreatic cancer Neg Hx   . Prostate cancer Neg Hx   . Rectal cancer Neg Hx   . Stomach cancer Neg Hx   . Ulcerative colitis Neg Hx   . Uterine cancer Neg Hx   . Wilson's disease Neg Hx     BP 122/62   Pulse 71   Ht _0  (1.727 m)   Wt 136 lb (61.7 kg)   SpO2 96%   BMI 20.68 kg/m    Review of Systems He denies hypoglycemia.      Objective:   Physical Exam VITAL SIGNS:  See vs page GENERAL: no distress Pulses: foot pulses are intact bilaterally.   MSK: no deformity of the feet or ankles.  CV: no edema of the legs or ankles Skin:  no ulcer on the feet or ankles.  normal color and temp on the feet and ankles Neuro: sensation is intact to touch on the feet and ankles.      Lab Results  Component Value Date   HGBA1C 6.2 09/03/2016   Lab Results  Component Value Date   CREATININE 0.99 02/28/2016   BUN 32 (H) 02/28/2016   NA 136 02/28/2016   K 3.8 02/28/2016   CL 103 02/28/2016   CO2 28 02/28/2016      Assessment & Plan:  Insulin-requiring type 2 DM: well-controlled.   Lean body habitus: we again discussed the risk of evolving type 1 DM.    Patient Instructions  Please come back for a regular physical appointment in 3 months.  Please continue the same medications.  check your blood sugar 4 times a day: before the 3 meals, and at bedtime.  also check if you have symptoms of your blood sugar being too high or too low.  please keep a record of the readings and bring it to your next appointment here (or you can  bring the meter itself).  You can write it on any piece of paper.  please call us sooner if your blood sugar goes below 70, or if you have a lot of readings  over 200.

## 2016-09-03 NOTE — Patient Instructions (Signed)
Please come back for a regular physical appointment in 3 months.  Please continue the same medications.  check your blood sugar 4 times a day: before the 3 meals, and at bedtime.  also check if you have symptoms of your blood sugar being too high or too low.  please keep a record of the readings and bring it to your next appointment here (or you can bring the meter itself).  You can write it on any piece of paper.  please call us sooner if your blood sugar goes below 70, or if you have a lot of readings over 200.

## 2016-09-07 ENCOUNTER — Encounter: Payer: Self-pay | Admitting: Endocrinology

## 2016-09-12 ENCOUNTER — Other Ambulatory Visit: Payer: Self-pay | Admitting: Endocrinology

## 2016-09-13 ENCOUNTER — Encounter: Payer: Self-pay | Admitting: Endocrinology

## 2016-09-13 ENCOUNTER — Other Ambulatory Visit: Payer: Self-pay | Admitting: Endocrinology

## 2016-09-13 ENCOUNTER — Other Ambulatory Visit: Payer: Self-pay

## 2016-09-24 ENCOUNTER — Other Ambulatory Visit: Payer: Self-pay | Admitting: Endocrinology

## 2016-09-25 ENCOUNTER — Other Ambulatory Visit: Payer: Self-pay

## 2016-09-25 MED ORDER — GLUCOSE BLOOD VI STRP: ORAL_STRIP | 3 refills | Status: DC

## 2016-10-05 ENCOUNTER — Encounter: Payer: Self-pay | Admitting: Endocrinology

## 2016-10-08 ENCOUNTER — Other Ambulatory Visit: Payer: Self-pay | Admitting: Endocrinology

## 2016-10-08 DIAGNOSIS — E291 Testicular hypofunction: Secondary | ICD-10-CM

## 2016-10-14 ENCOUNTER — Other Ambulatory Visit: Payer: Self-pay | Admitting: Endocrinology

## 2016-10-22 ENCOUNTER — Encounter: Payer: Self-pay | Admitting: Endocrinology

## 2016-10-22 ENCOUNTER — Other Ambulatory Visit: Payer: Self-pay | Admitting: Endocrinology

## 2016-10-26 ENCOUNTER — Other Ambulatory Visit: Payer: Medicare Other

## 2016-10-26 DIAGNOSIS — E291 Testicular hypofunction: Secondary | ICD-10-CM

## 2016-10-28 LAB — TESTOSTERONE,FREE AND TOTAL
TESTOSTERONE FREE: 5.1 pg/mL — AB (ref 6.6–18.1)
Testosterone: 384 ng/dL (ref 264–916)

## 2016-11-24 ENCOUNTER — Encounter: Payer: Self-pay | Admitting: Endocrinology

## 2016-11-26 ENCOUNTER — Other Ambulatory Visit: Payer: Self-pay | Admitting: Endocrinology

## 2016-11-29 ENCOUNTER — Encounter: Payer: Self-pay | Admitting: Endocrinology

## 2016-11-30 ENCOUNTER — Telehealth: Payer: Self-pay | Admitting: Endocrinology

## 2016-11-30 NOTE — Telephone Encounter (Signed)
Patient is requesting a refill for Victoza 90 day supply with 3 refills to pharmacy [facility]

## 2016-12-03 ENCOUNTER — Encounter: Payer: Self-pay | Admitting: Endocrinology

## 2016-12-03 ENCOUNTER — Ambulatory Visit (INDEPENDENT_AMBULATORY_CARE_PROVIDER_SITE_OTHER): Payer: Medicare Other | Admitting: Endocrinology

## 2016-12-03 ENCOUNTER — Other Ambulatory Visit: Payer: Self-pay

## 2016-12-03 VITALS — BP 122/64 | HR 67 | Wt 139.8 lb

## 2016-12-03 DIAGNOSIS — E119 Type 2 diabetes mellitus without complications: Secondary | ICD-10-CM

## 2016-12-03 DIAGNOSIS — E291 Testicular hypofunction: Secondary | ICD-10-CM | POA: Diagnosis not present

## 2016-12-03 DIAGNOSIS — Z23 Encounter for immunization: Secondary | ICD-10-CM | POA: Diagnosis not present

## 2016-12-03 DIAGNOSIS — Z125 Encounter for screening for malignant neoplasm of prostate: Secondary | ICD-10-CM

## 2016-12-03 DIAGNOSIS — Z0000 Encounter for general adult medical examination without abnormal findings: Secondary | ICD-10-CM | POA: Diagnosis not present

## 2016-12-03 LAB — URINALYSIS, ROUTINE W REFLEX MICROSCOPIC
Bilirubin Urine: NEGATIVE
Hgb urine dipstick: NEGATIVE
Ketones, ur: NEGATIVE
Leukocytes, UA: NEGATIVE
Nitrite: NEGATIVE
PH: 5.5 (ref 5.0–8.0)
RBC / HPF: NONE SEEN (ref 0–?)
SPECIFIC GRAVITY, URINE: 1.02 (ref 1.000–1.030)
TOTAL PROTEIN, URINE-UPE24: NEGATIVE
URINE GLUCOSE: NEGATIVE
Urobilinogen, UA: 0.2 (ref 0.0–1.0)
WBC UA: NONE SEEN (ref 0–?)

## 2016-12-03 LAB — BASIC METABOLIC PANEL
BUN: 30 mg/dL — AB (ref 6–23)
CHLORIDE: 101 meq/L (ref 96–112)
CO2: 30 meq/L (ref 19–32)
CREATININE: 0.98 mg/dL (ref 0.40–1.50)
Calcium: 9.5 mg/dL (ref 8.4–10.5)
GFR: 81.23 mL/min (ref >60.00)
Glucose, Bld: 134 mg/dL — ABNORMAL HIGH (ref 70–99)
POTASSIUM: 4.3 meq/L (ref 3.5–5.1)
Sodium: 138 mEq/L (ref 135–145)

## 2016-12-03 LAB — POCT GLYCOSYLATED HEMOGLOBIN (HGB A1C): HEMOGLOBIN A1C: 6.5

## 2016-12-03 LAB — CBC WITH DIFFERENTIAL/PLATELET
BASOS PCT: 0.6 % (ref 0.0–3.0)
Basophils Absolute: 0 10*3/uL (ref 0.0–0.1)
EOS ABS: 0.2 10*3/uL (ref 0.0–0.7)
Eosinophils Relative: 3.4 % (ref 0.0–5.0)
HEMATOCRIT: 41.4 % (ref 39.0–52.0)
Hemoglobin: 13.6 g/dL (ref 13.0–17.0)
LYMPHS ABS: 1.3 10*3/uL (ref 0.7–4.0)
LYMPHS PCT: 22.4 % (ref 12.0–46.0)
MCHC: 32.9 g/dL (ref 30.0–36.0)
MCV: 91.7 fl (ref 78.0–100.0)
Monocytes Absolute: 0.4 10*3/uL (ref 0.1–1.0)
Monocytes Relative: 6.6 % (ref 3.0–12.0)
NEUTROS ABS: 4 10*3/uL (ref 1.4–7.7)
Neutrophils Relative %: 67 % (ref 43.0–77.0)
PLATELETS: 181 10*3/uL (ref 150.0–400.0)
RBC: 4.51 Mil/uL (ref 4.22–5.81)
RDW: 14 % (ref 11.5–15.5)
WBC: 6 10*3/uL (ref 4.0–10.5)

## 2016-12-03 LAB — HEPATIC FUNCTION PANEL
ALT: 19 U/L (ref 0–53)
AST: 23 U/L (ref 0–37)
Albumin: 4.1 g/dL (ref 3.5–5.2)
Alkaline Phosphatase: 40 U/L (ref 39–117)
BILIRUBIN TOTAL: 0.3 mg/dL (ref 0.2–1.2)
Bilirubin, Direct: 0.1 mg/dL (ref 0.0–0.3)
TOTAL PROTEIN: 6 g/dL (ref 6.0–8.3)

## 2016-12-03 LAB — LIPID PANEL
CHOL/HDL RATIO: 2
CHOLESTEROL: 156 mg/dL (ref 0–200)
HDL: 85.7 mg/dL (ref >39.00)
LDL CALC: 55 mg/dL (ref 0–99)
NonHDL: 69.87
TRIGLYCERIDES: 73 mg/dL (ref 0.0–149.0)
VLDL: 14.6 mg/dL (ref 0.0–40.0)

## 2016-12-03 LAB — PSA: PSA: 2.48 ng/mL (ref 0.10–4.00)

## 2016-12-03 LAB — TSH: TSH: 2.72 u[IU]/mL (ref 0.35–4.50)

## 2016-12-03 LAB — MICROALBUMIN / CREATININE URINE RATIO
CREATININE, U: 104.1 mg/dL
MICROALB UR: 3 mg/dL — AB (ref 0.0–1.9)
Microalb Creat Ratio: 2.8 mg/g (ref 0.0–30.0)

## 2016-12-03 MED ORDER — LIRAGLUTIDE 18 MG/3ML ~~LOC~~ SOPN: PEN_INJECTOR | SUBCUTANEOUS | 11 refills | Status: DC

## 2016-12-03 NOTE — Telephone Encounter (Signed)
Done

## 2016-12-03 NOTE — Patient Instructions (Addendum)
Please continue the same medications.   Please consider these measures for your health:  minimize alcohol.  Do not use tobacco products.  Have a colonoscopy at least every 10 years from age 66.  Keep firearms safely stored.  Always use seat belts.  have working smoke alarms in your home.  See an eye doctor and dentist regularly.  Never drive under the influence of alcohol or drugs (including prescription drugs).  Those with fair skin should take precautions against the sun, and should carefully examine their skin once per month, for any new or changed moles.  Please come back for a follow-up appointment in 3-4 months.

## 2016-12-03 NOTE — Progress Notes (Signed)
Subjective:    Patient ID: David Maynard, male    DOB: 12/23/1950, 66 y.o.   MRN: [id number]  HPI Pt is here for regular wellness examination, and is feeling pretty well in general, and says chronic med probs are stable, except as noted below Past Medical History:  Diagnosis Date  . Depressive disorder, not elsewhere classified   . Diabetes mellitus type II   . Hypercholesterolemia   . Hypogonadism male   . Thyroid disease   . Unspecified hypothyroidism     Past Surgical History:  Procedure Laterality Date  . ESOPHAGOGASTRODUODENOSCOPY  06/16/2003  . INGUINAL HERNIA REPAIR     left  . KNEE ARTHROSCOPY     Left  . KNEE ARTHROSCOPY W/ ACL RECONSTRUCTION     left  . ROTATOR CUFF REPAIR     Right  . Stress Cardiolite  07/15/2000  . VASECTOMY  05/2002    Social History   Socioeconomic History  . Marital status: Married    Spouse name: Not on file  . Number of children: Not on file  . Years of education: Not on file  . Highest education level: Not on file  Social Needs  . Financial resource strain: Not on file  . Food insecurity - worry: Not on file  . Food insecurity - inability: Not on file  . Transportation needs - medical: Not on file  . Transportation needs - non-medical: Not on file  Occupational History  . Not on file  Tobacco Use  . Smoking status: Never Smoker  . Smokeless tobacco: Never Used  Substance and Sexual Activity  . Alcohol use: Yes    Alcohol/week: 0.6 oz    Types: 1 Cans of beer per week  . Drug use: No  . Sexual activity: Not on file  Other Topics Concern  . Not on file  Social History Narrative  . Not on file    Current Outpatient Medications on File Prior to Visit  Medication Sig Dispense Refill  . Ascorbic Acid (VITAMIN C) 500 MG tablet Take 500 mg by mouth daily.      . Blood Glucose Monitoring Suppl (ACCU-CHEK NANO SMARTVIEW) w/Device KIT Use to check blood sugar 6 times per day. 1 kit 0  . bromocriptine (PARLODEL) 2.5 MG  tablet TAKE ONE-HALF TABLET BY  MOUTH DAILY 45 tablet 11  . clomiPHENE (CLOMID) 50 MG tablet TAKE ONE-HALF TABLET BY  MOUTH DAILY 45 tablet 2  . colesevelam (WELCHOL) 625 MG tablet Take 2 tablets (1,250 mg total) by mouth daily. 180 tablet 3  . EASY TOUCH PEN NEEDLES 32G X 6 MM MISC USE ONCE DAILY IN THE  MORNING 90 each 4  . glucosamine-chondroitin 500-400 MG tablet Take 1 tablet by mouth 2 (two) times daily.      Marland Kitchen glucose blood (ACCU-CHEK SMARTVIEW) test strip USE TO CHECK BLOOD SUGAR 6  TIMES PER DAY 540 each 3  . Insulin Pen Needle 32G X 5 MM MISC 1 each by Does not apply route every morning. 100 each 1  . levothyroxine (SYNTHROID, LEVOTHROID) 50 MCG tablet TAKE 1 TABLET BY MOUTH  DAILY BEFORE BREAKFAST 90 tablet 2  . metFORMIN (GLUCOPHAGE-XR) 500 MG 24 hr tablet TAKE 2 TABLETS BY MOUTH TWO TIMES DAILY 360 tablet 2  . Multiple Vitamin (MULTIVITAMIN) capsule Take 1 capsule by mouth daily.      Marland Kitchen omega-3 acid ethyl esters (LOVAZA) 1 g capsule TAKE 2 CAPSULES BY MOUTH  TWO TIMES DAILY 360 capsule  1  . pioglitazone (ACTOS) 45 MG tablet TAKE 1 TABLET BY MOUTH  DAILY 90 tablet 3  . simvastatin (ZOCOR) 20 MG tablet TAKE 1 TABLET BY MOUTH AT  BEDTIME 90 tablet 3  . TURMERIC PO Take 1 tablet by mouth 2 (two) times daily.     . vitamin D, CHOLECALCIFEROL, 400 UNITS tablet Take 400 Units by mouth 2 (two) times daily.      . vitamin E 400 UNIT capsule Take 400 Units by mouth. Three times a week     No current facility-administered medications on file prior to visit.     No Known Allergies  Family History  Problem Relation Age of Onset  . Breast cancer Neg Hx   . Celiac disease Neg Hx   . Cirrhosis Neg Hx   . Clotting disorder Neg Hx   . Colitis Neg Hx   . Colon cancer Neg Hx   . Colon polyps Neg Hx   . Crohn's disease Neg Hx   . Cystic fibrosis Neg Hx   . Diabetes Neg Hx   . Esophageal cancer Neg Hx   . Heart disease Neg Hx   . Hemochromatosis Neg Hx   . Inflammatory bowel disease Neg  Hx   . Irritable bowel syndrome Neg Hx   . Kidney disease Neg Hx   . Liver cancer Neg Hx   . Liver disease Neg Hx   . Ovarian cancer Neg Hx   . Pancreatic cancer Neg Hx   . Prostate cancer Neg Hx   . Rectal cancer Neg Hx   . Stomach cancer Neg Hx   . Ulcerative colitis Neg Hx   . Uterine cancer Neg Hx   . Wilson's disease Neg Hx     BP 122/64 (BP Location: Left Arm, Patient Position: Sitting, Cuff Size: Normal)   Pulse 67   Wt 139 lb 12.8 oz (63.4 kg)   SpO2 98%   BMI 21.26 kg/m     Review of Systems Denies fever, fatigue, visual loss, hearing loss, chest pain, sob, back pain, depression, cold intolerance, BRBPR, hematuria, syncope, numbness, allergy sxs, easy bruising, and rash.     Objective:   Physical Exam VS: see vs page GEN: no distress HEAD: head: no deformity eyes: no periorbital swelling, no proptosis external nose and ears are normal mouth: no lesion seen NECK: supple, thyroid is not enlarged CHEST WALL: no deformity LUNGS: clear to auscultation BREASTS:  No gynecomastia.   CV: reg rate and rhythm, no murmur ABD: abdomen is soft, nontender.  no hepatosplenomegaly.  not distended.  no hernia.  GENITALIA/RECTAL/PROSTATE: sees urology.  MUSCULOSKELETAL: muscle bulk and strength are grossly normal.  no obvious joint swelling.  gait is normal and steady.  EXTEMITIES: no deformity.  no ulcer on the feet.  feet are of normal color and temp.  no edema.   PULSES: dorsalis pedis intact bilat.  no carotid bruit.   NEURO:  cn 2-12 grossly intact.   readily moves all 4's.  sensation is intact to touch on the feet.   SKIN:  Normal texture and temperature.  No rash or suspicious lesion is visible.   NODES:  None palpable at the neck.   PSYCH: alert, well-oriented.  Does not appear anxious nor depressed.     Lab Results  Component Value Date   HGBA1C 6.5 12/03/2016   ecg machine is not working today     Assessment & Plan:  Wellness visit today, with problems  stable, except  as noted.  Type 2 DM: well-controlled.   Patient Instructions  Please continue the same medications.   Please consider these measures for your health:  minimize alcohol.  Do not use tobacco products.  Have a colonoscopy at least every 10 years from age 39.  Keep firearms safely stored.  Always use seat belts.  have working smoke alarms in your home.  See an eye doctor and dentist regularly.  Never drive under the influence of alcohol or drugs (including prescription drugs).  Those with fair skin should take precautions against the sun, and should carefully examine their skin once per month, for any new or changed moles.  Please come back for a follow-up appointment in 3-4 months.

## 2016-12-04 LAB — TESTOSTERONE,FREE AND TOTAL
TESTOSTERONE FREE: 4.9 pg/mL — AB (ref 6.6–18.1)
Testosterone: 444 ng/dL (ref 264–916)

## 2016-12-07 ENCOUNTER — Other Ambulatory Visit: Payer: Self-pay

## 2016-12-07 MED ORDER — LIRAGLUTIDE 18 MG/3ML ~~LOC~~ SOPN: PEN_INJECTOR | SUBCUTANEOUS | 11 refills | Status: DC

## 2016-12-30 ENCOUNTER — Encounter: Payer: Self-pay | Admitting: Endocrinology

## 2017-01-08 ENCOUNTER — Ambulatory Visit: Payer: Medicare Other

## 2017-02-18 ENCOUNTER — Other Ambulatory Visit: Payer: Self-pay | Admitting: Endocrinology

## 2017-02-28 ENCOUNTER — Encounter: Payer: Self-pay | Admitting: Endocrinology

## 2017-03-01 ENCOUNTER — Other Ambulatory Visit: Payer: Self-pay | Admitting: Endocrinology

## 2017-03-01 MED ORDER — OSELTAMIVIR PHOSPHATE 75 MG PO CAPS: 75.0000 mg | ORAL_CAPSULE | Freq: Two times a day (BID) | ORAL | 0 refills | Status: DC

## 2017-03-01 NOTE — Telephone Encounter (Signed)
Patient stated he has the flu he still have a fever, he took some tylenol, and it took It down some, he need some tama flu called into his pharmacy. Please before the weekend.

## 2017-03-02 ENCOUNTER — Other Ambulatory Visit: Payer: Self-pay | Admitting: Family Medicine

## 2017-03-02 MED ORDER — OSELTAMIVIR PHOSPHATE 75 MG PO CAPS: 75.0000 mg | ORAL_CAPSULE | Freq: Two times a day (BID) | ORAL | 0 refills | Status: DC

## 2017-03-02 NOTE — Progress Notes (Signed)
Pt called on call team - hasn't received tamiflu RX - I reviewed message. [facility].

## 2017-03-04 ENCOUNTER — Other Ambulatory Visit: Payer: Self-pay

## 2017-03-04 MED ORDER — OSELTAMIVIR PHOSPHATE 75 MG PO CAPS: 75.0000 mg | ORAL_CAPSULE | Freq: Two times a day (BID) | ORAL | 0 refills | Status: DC

## 2017-03-04 NOTE — Telephone Encounter (Signed)
Patient stated his medication Tamiflu was sent to the wrong pharmacy, [facility] to [facility], but it's needs to go to [facility] [phone] - [place] - [address] [facility] [address] [phone] (Phone) [phone] (Fax)

## 2017-03-11 ENCOUNTER — Ambulatory Visit (INDEPENDENT_AMBULATORY_CARE_PROVIDER_SITE_OTHER): Payer: Medicare Other | Admitting: Endocrinology

## 2017-03-11 ENCOUNTER — Encounter: Payer: Self-pay | Admitting: Endocrinology

## 2017-03-11 VITALS — BP 116/62 | HR 95 | Wt 138.0 lb

## 2017-03-11 DIAGNOSIS — E119 Type 2 diabetes mellitus without complications: Secondary | ICD-10-CM | POA: Diagnosis not present

## 2017-03-11 LAB — POCT GLYCOSYLATED HEMOGLOBIN (HGB A1C): HEMOGLOBIN A1C: 6.6

## 2017-03-11 MED ORDER — BROMOCRIPTINE MESYLATE 2.5 MG PO TABS: 2.5000 mg | ORAL_TABLET | Freq: Every day | ORAL | 3 refills | Status: DC

## 2017-03-11 NOTE — Patient Instructions (Addendum)
I have sent a prescription to your pharmacy, to double the bromocriptine.  Please continue the same other diabetes medications.   Please come back for a follow-up appointment in 3 months.  check your blood sugar once a day.  vary the time of day when you check, between before the 3 meals, and at bedtime.  also check if you have symptoms of your blood sugar being too high or too low.  please keep a record of the readings and bring it to your next appointment here (or you can bring the meter itself).  You can write it on any piece of paper.  please call us sooner if your blood sugar goes below 70, or if you have a lot of readings over 200.

## 2017-03-11 NOTE — Progress Notes (Signed)
Subjective:    Patient ID: David Maynard, male    DOB: [birth year], 67 y.o.   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   Dx'ed: 3825 Complications: renal insufficiency.   Therapy: victoza + 4 oral meds.  DKA: never Severe hypoglycemia: never.  Pancreatitis: never.  Other: he has never taken insulin.   Interval history: he tolerates DM meds well.  pt states he feels well in general.  He brings a record of his cbg's which I have reviewed today. All are in the low to mid-100's.  Past Medical History:  Diagnosis Date  . Depressive disorder, not elsewhere classified   . Diabetes mellitus type II   . Hypercholesterolemia   . Hypogonadism male   . Thyroid disease   . Unspecified hypothyroidism     Past Surgical History:  Procedure Laterality Date  . ESOPHAGOGASTRODUODENOSCOPY  06/16/2003  . INGUINAL HERNIA REPAIR     left  . KNEE ARTHROSCOPY     Left  . KNEE ARTHROSCOPY W/ ACL RECONSTRUCTION     left  . ROTATOR CUFF REPAIR     Right  . Stress Cardiolite  07/15/2000  . VASECTOMY  05/2002    Social History   Socioeconomic History  . Marital status: Married    Spouse name: Not on file  . Number of children: Not on file  . Years of education: Not on file  . Highest education level: Not on file  Social Needs  . Financial resource strain: Not on file  . Food insecurity - worry: Not on file  . Food insecurity - inability: Not on file  . Transportation needs - medical: Not on file  . Transportation needs - non-medical: Not on file  Occupational History  . Not on file  Tobacco Use  . Smoking status: Never Smoker  . Smokeless tobacco: Never Used  Substance and Sexual Activity  . Alcohol use: Yes    Alcohol/week: 0.6 oz    Types: 1 Cans of beer per week  . Drug use: No  . Sexual activity: Not on file  Other Topics Concern  . Not on file  Social History  Narrative  . Not on file    Current Outpatient Medications on File Prior to Visit  Medication Sig Dispense Refill  . Ascorbic Acid (VITAMIN C) 500 MG tablet Take 500 mg by mouth daily.      . Blood Glucose Monitoring Suppl (ACCU-CHEK NANO SMARTVIEW) w/Device KIT Use to check blood sugar 6 times per day. 1 kit 0  . clomiPHENE (CLOMID) 50 MG tablet TAKE ONE-HALF TABLET BY  MOUTH DAILY 45 tablet 0  . colesevelam (WELCHOL) 625 MG tablet TAKE 2 TABLETS BY MOUTH  DAILY 180 tablet 0  . EASY TOUCH PEN NEEDLES 32G X 6 MM MISC USE ONCE DAILY IN THE  MORNING 90 each 4  . glucosamine-chondroitin 500-400 MG tablet Take 1 tablet by mouth 2 (two) times daily.      Marland Kitchen glucose blood (ACCU-CHEK SMARTVIEW) test strip USE TO CHECK BLOOD SUGAR 6  TIMES PER DAY 540 each 3  . Insulin Pen Needle 32G X 5 MM MISC 1 each by Does not apply route every morning. 100 each 1  . levothyroxine (SYNTHROID, LEVOTHROID) 50 MCG tablet TAKE 1 TABLET BY MOUTH  DAILY BEFORE BREAKFAST 90 tablet 2  . liraglutide (VICTOZA) 18 MG/3ML  SOPN INJECT SUBCUTANEOUSLY 1.8MG EVERY MORNING 27 mL 11  . metFORMIN (GLUCOPHAGE-XR) 500 MG 24 hr tablet TAKE 2 TABLETS BY MOUTH TWO TIMES DAILY 360 tablet 2  . mirabegron ER (MYRBETRIQ) 25 MG TB24 tablet Take 25 mg by mouth daily.    . Multiple Vitamin (MULTIVITAMIN) capsule Take 1 capsule by mouth daily.      Marland Kitchen omega-3 acid ethyl esters (LOVAZA) 1 g capsule TAKE 2 CAPSULES BY MOUTH  TWO TIMES DAILY 360 capsule 1  . oseltamivir (TAMIFLU) 75 MG capsule Take 1 capsule (75 mg total) by mouth 2 (two) times daily. 10 capsule 0  . pioglitazone (ACTOS) 45 MG tablet TAKE 1 TABLET BY MOUTH  DAILY 90 tablet 3  . simvastatin (ZOCOR) 20 MG tablet TAKE 1 TABLET BY MOUTH AT  BEDTIME 90 tablet 3  . TURMERIC PO Take 1 tablet by mouth 2 (two) times daily.     . vitamin D, CHOLECALCIFEROL, 400 UNITS tablet Take 400 Units by mouth 2 (two) times daily.      . vitamin E 400 UNIT capsule Take 400 Units by mouth. Three times a  week     No current facility-administered medications on file prior to visit.     No Known Allergies  Family History  Problem Relation Age of Onset  . Breast cancer Neg Hx   . Celiac disease Neg Hx   . Cirrhosis Neg Hx   . Clotting disorder Neg Hx   . Colitis Neg Hx   . Colon cancer Neg Hx   . Colon polyps Neg Hx   . Crohn's disease Neg Hx   . Cystic fibrosis Neg Hx   . Diabetes Neg Hx   . Esophageal cancer Neg Hx   . Heart disease Neg Hx   . Hemochromatosis Neg Hx   . Inflammatory bowel disease Neg Hx   . Irritable bowel syndrome Neg Hx   . Kidney disease Neg Hx   . Liver cancer Neg Hx   . Liver disease Neg Hx   . Ovarian cancer Neg Hx   . Pancreatic cancer Neg Hx   . Prostate cancer Neg Hx   . Rectal cancer Neg Hx   . Stomach cancer Neg Hx   . Ulcerative colitis Neg Hx   . Uterine cancer Neg Hx   . Wilson's disease Neg Hx     BP 116/62 (BP Location: Left Arm, Patient Position: Sitting, Cuff Size: Normal)   Pulse 95   Wt 138 lb (62.6 kg)   SpO2 (!) 78%   BMI 20.98 kg/m    Review of Systems He denies hypoglycemia.      Objective:   Physical Exam VITAL SIGNS:  See vs page GENERAL: no distress Pulses: dorsalis pedis intact bilat.   MSK: no deformity of the feet CV: no leg edema Skin:  no ulcer on the feet.  normal color and temp on the feet. Neuro: sensation is intact to touch on the feet  Lab Results  Component Value Date   CREATININE 0.98 12/03/2016   BUN 30 (H) 12/03/2016   NA 138 12/03/2016   K 4.3 12/03/2016   CL 101 12/03/2016   CO2 30 12/03/2016   Lab Results  Component Value Date   HGBA1C 6.6 03/11/2017      Assessment & Plan:  Type 2 DM: he needs increased rx, if it can be done with a regimen that avoids or minimizes hypoglycemia. Renal insuff: this limits rx options.  Low 02 sat: in  view of lack of sob, this is wrong.  Patient Instructions  I have sent a prescription to your pharmacy, to double the bromocriptine.  Please continue  the same other diabetes medications.   Please come back for a follow-up appointment in 3 months.  check your blood sugar once a day.  vary the time of day when you check, between before the 3 meals, and at bedtime.  also check if you have symptoms of your blood sugar being too high or too low.  please keep a record of the readings and bring it to your next appointment here (or you can bring the meter itself).  You can write it on any piece of paper.  please call us sooner if your blood sugar goes below 70, or if you have a lot of readings over 200.

## 2017-03-17 ENCOUNTER — Encounter: Payer: Self-pay | Admitting: Endocrinology

## 2017-03-18 ENCOUNTER — Ambulatory Visit (INDEPENDENT_AMBULATORY_CARE_PROVIDER_SITE_OTHER): Payer: Medicare Other | Admitting: Endocrinology

## 2017-03-18 ENCOUNTER — Encounter: Payer: Self-pay | Admitting: Endocrinology

## 2017-03-18 DIAGNOSIS — R319 Hematuria, unspecified: Secondary | ICD-10-CM | POA: Diagnosis not present

## 2017-03-18 LAB — POCT URINALYSIS DIPSTICK
BILIRUBIN UA: NEGATIVE
Glucose, UA: NEGATIVE
KETONES UA: NEGATIVE
NITRITE UA: NEGATIVE
Spec Grav, UA: 1.02 (ref 1.010–1.025)
Urobilinogen, UA: 0.2 E.U./dL
pH, UA: 6 (ref 5.0–8.0)

## 2017-03-18 MED ORDER — CEFUROXIME AXETIL 500 MG PO TABS: 500.0000 mg | ORAL_TABLET | Freq: Two times a day (BID) | ORAL | 0 refills | Status: DC

## 2017-03-18 MED ORDER — NATEGLINIDE 60 MG PO TABS: 30.0000 mg | ORAL_TABLET | Freq: Three times a day (TID) | ORAL | 3 refills | Status: DC

## 2017-03-18 NOTE — Progress Notes (Signed)
Subjective:    Patient ID: David Maynard, male    DOB: Jun 20, 1950, 67 y.o.   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   Dx'ed: 0626 Complications: renal insufficiency.   Therapy: victoza + 4 oral meds.  DKA: never Severe hypoglycemia: never.  Pancreatitis: never.  Other: he has never taken insulin.   Interval history:He has had some postprandial cbg's as high as 300.   Pt states 2 days ago, he had an episode of slight dysuria sensation at the urethra, and assoc hematuria.   Past Medical History:  Diagnosis Date  . Depressive disorder, not elsewhere classified   . Diabetes mellitus type II   . Hypercholesterolemia   . Hypogonadism male   . Thyroid disease   . Unspecified hypothyroidism     Past Surgical History:  Procedure Laterality Date  . ESOPHAGOGASTRODUODENOSCOPY  06/16/2003  . INGUINAL HERNIA REPAIR     left  . KNEE ARTHROSCOPY     Left  . KNEE ARTHROSCOPY W/ ACL RECONSTRUCTION     left  . ROTATOR CUFF REPAIR     Right  . Stress Cardiolite  07/15/2000  . VASECTOMY  05/2002    Social History   Socioeconomic History  . Marital status: Married    Spouse name: Not on file  . Number of children: Not on file  . Years of education: Not on file  . Highest education level: Not on file  Social Needs  . Financial resource strain: Not on file  . Food insecurity - worry: Not on file  . Food insecurity - inability: Not on file  . Transportation needs - medical: Not on file  . Transportation needs - non-medical: Not on file  Occupational History  . Not on file  Tobacco Use  . Smoking status: Never Smoker  . Smokeless tobacco: Never Used  Substance and Sexual Activity  . Alcohol use: Yes    Alcohol/week: 0.6 oz    Types: 1 Cans of beer per week  . Drug use: No  . Sexual activity: Not on file  Other Topics Concern  . Not on file  Social History  Narrative  . Not on file    Current Outpatient Medications on File Prior to Visit  Medication Sig Dispense Refill  . Ascorbic Acid (VITAMIN C) 500 MG tablet Take 500 mg by mouth daily.      . Blood Glucose Monitoring Suppl (ACCU-CHEK NANO SMARTVIEW) w/Device KIT Use to check blood sugar 6 times per day. 1 kit 0  . bromocriptine (PARLODEL) 2.5 MG tablet Take 1 tablet (2.5 mg total) by mouth daily. 90 tablet 3  . clomiPHENE (CLOMID) 50 MG tablet TAKE ONE-HALF TABLET BY  MOUTH DAILY 45 tablet 0  . colesevelam (WELCHOL) 625 MG tablet TAKE 2 TABLETS BY MOUTH  DAILY 180 tablet 0  . EASY TOUCH PEN NEEDLES 32G X 6 MM MISC USE ONCE DAILY IN THE  MORNING 90 each 4  . glucosamine-chondroitin 500-400 MG tablet Take 1 tablet by mouth 2 (two) times daily.      Marland Kitchen glucose blood (ACCU-CHEK SMARTVIEW) test strip USE TO CHECK BLOOD SUGAR 6  TIMES PER DAY 540 each 3  . Insulin Pen Needle 32G X 5 MM MISC 1 each by Does not apply route every morning. 100 each 1  . levothyroxine (SYNTHROID, LEVOTHROID) 50 MCG tablet  TAKE 1 TABLET BY MOUTH  DAILY BEFORE BREAKFAST 90 tablet 2  . liraglutide (VICTOZA) 18 MG/3ML SOPN INJECT SUBCUTANEOUSLY 1.'8MG'$  EVERY MORNING 27 mL 11  . metFORMIN (GLUCOPHAGE-XR) 500 MG 24 hr tablet TAKE 2 TABLETS BY MOUTH TWO TIMES DAILY 360 tablet 2  . mirabegron ER (MYRBETRIQ) 25 MG TB24 tablet Take 25 mg by mouth daily.    . Multiple Vitamin (MULTIVITAMIN) capsule Take 1 capsule by mouth daily.      Marland Kitchen omega-3 acid ethyl esters (LOVAZA) 1 g capsule TAKE 2 CAPSULES BY MOUTH  TWO TIMES DAILY 360 capsule 1  . pioglitazone (ACTOS) 45 MG tablet TAKE 1 TABLET BY MOUTH  DAILY 90 tablet 3  . simvastatin (ZOCOR) 20 MG tablet TAKE 1 TABLET BY MOUTH AT  BEDTIME 90 tablet 3  . TURMERIC PO Take 1 tablet by mouth 2 (two) times daily.     . vitamin D, CHOLECALCIFEROL, 400 UNITS tablet Take 400 Units by mouth 2 (two) times daily.      . vitamin E 400 UNIT capsule Take 400 Units by mouth. Three times a week     No  current facility-administered medications on file prior to visit.     No Known Allergies  Family History  Problem Relation Age of Onset  . Breast cancer Neg Hx   . Celiac disease Neg Hx   . Cirrhosis Neg Hx   . Clotting disorder Neg Hx   . Colitis Neg Hx   . Colon cancer Neg Hx   . Colon polyps Neg Hx   . Crohn's disease Neg Hx   . Cystic fibrosis Neg Hx   . Diabetes Neg Hx   . Esophageal cancer Neg Hx   . Heart disease Neg Hx   . Hemochromatosis Neg Hx   . Inflammatory bowel disease Neg Hx   . Irritable bowel syndrome Neg Hx   . Kidney disease Neg Hx   . Liver cancer Neg Hx   . Liver disease Neg Hx   . Ovarian cancer Neg Hx   . Pancreatic cancer Neg Hx   . Prostate cancer Neg Hx   . Rectal cancer Neg Hx   . Stomach cancer Neg Hx   . Ulcerative colitis Neg Hx   . Uterine cancer Neg Hx   . Wilson's disease Neg Hx     BP 110/62   Pulse 80   Ht '5\' 8"'$  (1.727 m)   Wt 138 lb 3.2 oz (62.7 kg)   SpO2 97%   BMI 21.01 kg/m    Review of Systems Denies fever and back pain    Objective:   Physical Exam VITAL SIGNS:  See vs page GENERAL: no distress Back: nontender.    UA: pos for UTI    Assessment & Plan:  UTI: new Type 2 DM: worse.  He is prob evolving type 1.   Patient Instructions  I have sent a prescription to your pharmacy, to add "nateglinide," with any larger meal.  However, with time, you will need to start insulin.   I don't know how long this will take.   I have also sent a prescription to your pharmacy, for an antibiotic pill.   I'll see you next time.

## 2017-03-18 NOTE — Telephone Encounter (Signed)
Duplicate from patient-patient said that he would fill out patient satisfaction form with a bad review if he does not get antibiotic he requested asap. Patient also wants to be notified once the Rx has been sent in so can start calling his pharmacy

## 2017-03-18 NOTE — Telephone Encounter (Signed)
[facility] [address] [facility]

## 2017-03-18 NOTE — Patient Instructions (Addendum)
I have sent a prescription to your pharmacy, to add "nateglinide," with any larger meal.  However, with time, you will need to start insulin.   I don't know how long this will take.   I have also sent a prescription to your pharmacy, for an antibiotic pill.   I'll see you next time.

## 2017-03-21 LAB — CULTURE, URINE COMPREHENSIVE
MICRO NUMBER:: 90243560
SPECIMEN QUALITY: ADEQUATE

## 2017-04-30 ENCOUNTER — Encounter: Payer: Self-pay | Admitting: Endocrinology

## 2017-05-16 ENCOUNTER — Other Ambulatory Visit: Payer: Self-pay | Admitting: Endocrinology

## 2017-05-16 ENCOUNTER — Encounter: Payer: Self-pay | Admitting: Endocrinology

## 2017-05-17 ENCOUNTER — Encounter: Payer: Self-pay | Admitting: Endocrinology

## 2017-05-17 MED ORDER — COLESEVELAM HCL 625 MG PO TABS: 1250.0000 mg | ORAL_TABLET | Freq: Every day | ORAL | 0 refills | Status: DC

## 2017-05-21 ENCOUNTER — Encounter: Payer: Self-pay | Admitting: Endocrinology

## 2017-05-23 ENCOUNTER — Other Ambulatory Visit: Payer: Self-pay

## 2017-05-23 MED ORDER — LEVOTHYROXINE SODIUM 50 MCG PO TABS: ORAL_TABLET | ORAL | 3 refills | Status: DC

## 2017-05-23 MED ORDER — OMEGA-3-ACID ETHYL ESTERS 1 G PO CAPS: ORAL_CAPSULE | ORAL | 3 refills | Status: DC

## 2017-05-23 MED ORDER — METFORMIN HCL ER 500 MG PO TB24: ORAL_TABLET | ORAL | 3 refills | Status: DC

## 2017-05-23 MED ORDER — COLESEVELAM HCL 625 MG PO TABS: 1250.0000 mg | ORAL_TABLET | Freq: Every day | ORAL | 0 refills | Status: DC

## 2017-05-26 ENCOUNTER — Encounter: Payer: Self-pay | Admitting: Endocrinology

## 2017-05-26 ENCOUNTER — Other Ambulatory Visit: Payer: Self-pay | Admitting: Endocrinology

## 2017-05-27 MED ORDER — CLOMIPHENE CITRATE 50 MG PO TABS: 25.0000 mg | ORAL_TABLET | Freq: Every day | ORAL | 0 refills | Status: DC

## 2017-05-28 ENCOUNTER — Telehealth: Payer: Self-pay

## 2017-05-28 NOTE — Telephone Encounter (Signed)
Patient called [facility] & stated his mail order prescription pharmacy is out of his testosterone. They aren't sure when they will get this back in. He takes 1/2 of a 50 mg tablet daily. He denies any worsening symptoms.

## 2017-05-28 NOTE — Telephone Encounter (Signed)
I would be happy to send wherever you want, but if you have to buy without insurance, it is cheapest at [facility].

## 2017-05-28 NOTE — Telephone Encounter (Signed)
LVM for patent to call back regarding testosterone prescription.

## 2017-06-07 ENCOUNTER — Ambulatory Visit (INDEPENDENT_AMBULATORY_CARE_PROVIDER_SITE_OTHER): Payer: Medicare Other | Admitting: Endocrinology

## 2017-06-07 ENCOUNTER — Encounter: Payer: Self-pay | Admitting: Endocrinology

## 2017-06-07 VITALS — BP 110/56 | HR 74 | Wt 135.8 lb

## 2017-06-07 DIAGNOSIS — E119 Type 2 diabetes mellitus without complications: Secondary | ICD-10-CM | POA: Diagnosis not present

## 2017-06-07 DIAGNOSIS — M170 Bilateral primary osteoarthritis of knee: Secondary | ICD-10-CM | POA: Diagnosis not present

## 2017-06-07 LAB — POCT GLYCOSYLATED HEMOGLOBIN (HGB A1C): HEMOGLOBIN A1C: 6.2

## 2017-06-07 MED ORDER — NATEGLINIDE 60 MG PO TABS: 30.0000 mg | ORAL_TABLET | Freq: Three times a day (TID) | ORAL | 3 refills | Status: DC

## 2017-06-07 NOTE — Patient Instructions (Addendum)
Please continue the same medications  Please come back for a follow-up appointment in 6 months (after 12/03/17).  Please do the a1c in 3 months Please consider these measures for your health:  minimize alcohol.  Do not use tobacco products.  Have a colonoscopy at least every 10 years from age 67.  Keep firearms safely stored.  Always use seat belts.  have working smoke alarms in your home.  See an eye doctor and dentist regularly.  Never drive under the influence of alcohol or drugs (including prescription drugs).  Those with fair skin should take precautions against the sun, and should carefully examine their skin once per month, for any new or changed moles. good diet and exercise significantly improve the control of your diabetes.  please let me know if you wish to be referred to a dietician.  high blood sugar is very risky to your health.  you should see an eye doctor and dentist every year.  It is very important to get all recommended vaccinations.  It is critically important to prevent falling down (keep floor areas well-lit, dry, and free of loose objects.  If you have a cane, walker, or wheelchair, you should use it, even for short trips around the house.  Wear flat-soled shoes.  Also, try not to rush)

## 2017-06-07 NOTE — Progress Notes (Signed)
Subjective:    Patient ID: David Maynard, male    DOB: [birth year], 67 y.o.   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   Dx'ed: 9417 Complications: renal insufficiency.   Therapy: victoza + 4 oral meds.  DKA: never Severe hypoglycemia: never.  Pancreatitis: never.  Other: he has never taken insulin.   Interval history: Meter is downloaded today, and the printout is scanned into the record.  It varies from 110-239.  It is in general higher as the day goes on.  He recently had steroid injections into both knees.   Past Medical History:  Diagnosis Date  . Depressive disorder, not elsewhere classified   . Diabetes mellitus type II   . Hypercholesterolemia   . Hypogonadism male   . Thyroid disease   . Unspecified hypothyroidism     Past Surgical History:  Procedure Laterality Date  . ESOPHAGOGASTRODUODENOSCOPY  06/16/2003  . INGUINAL HERNIA REPAIR     left  . KNEE ARTHROSCOPY     Left  . KNEE ARTHROSCOPY W/ ACL RECONSTRUCTION     left  . ROTATOR CUFF REPAIR     Right  . Stress Cardiolite  07/15/2000  . VASECTOMY  05/2002    Social History   Socioeconomic History  . Marital status: Married    Spouse name: Not on file  . Number of children: Not on file  . Years of education: Not on file  . Highest education level: Not on file  Occupational History  . Not on file  Social Needs  . Financial resource strain: Not on file  . Food insecurity:    Worry: Not on file    Inability: Not on file  . Transportation needs:    Medical: Not on file    Non-medical: Not on file  Tobacco Use  . Smoking status: Never Smoker  . Smokeless tobacco: Never Used  Substance and Sexual Activity  . Alcohol use: Yes    Alcohol/week: 0.6 oz    Types: 1 Cans of beer per week  . Drug use: No  . Sexual activity: Not on file  Lifestyle  . Physical activity:    Days per week: Not  on file    Minutes per session: Not on file  . Stress: Not on file  Relationships  . Social connections:    Talks on phone: Not on file    Gets together: Not on file    Attends religious service: Not on file    Active member of club or organization: Not on file    Attends meetings of clubs or organizations: Not on file    Relationship status: Not on file  . Intimate partner violence:    Fear of current or ex partner: Not on file    Emotionally abused: Not on file    Physically abused: Not on file    Forced sexual activity: Not on file  Other Topics Concern  . Not on file  Social History Narrative  . Not on file    Current Outpatient Medications on File Prior to Visit  Medication Sig Dispense Refill  . Ascorbic Acid (VITAMIN C) 500 MG tablet Take 500 mg by mouth daily.      . Blood Glucose Monitoring Suppl (ACCU-CHEK NANO SMARTVIEW) w/Device KIT Use to check blood sugar 6 times per day. 1 kit 0  .  bromocriptine (PARLODEL) 2.5 MG tablet Take 1 tablet (2.5 mg total) by mouth daily. 90 tablet 3  . cefUROXime (CEFTIN) 500 MG tablet Take 1 tablet (500 mg total) by mouth 2 (two) times daily with a meal. 14 tablet 0  . clomiPHENE (CLOMID) 50 MG tablet Take 0.5 tablets (25 mg total) by mouth daily. 45 tablet 0  . colesevelam (WELCHOL) 625 MG tablet Take 2 tablets (1,250 mg total) by mouth daily. 180 tablet 0  . EASY TOUCH PEN NEEDLES 32G X 6 MM MISC USE ONCE DAILY IN THE  MORNING 90 each 4  . glucosamine-chondroitin 500-400 MG tablet Take 1 tablet by mouth 2 (two) times daily.      Marland Kitchen glucose blood (ACCU-CHEK SMARTVIEW) test strip USE TO CHECK BLOOD SUGAR 6  TIMES PER DAY 540 each 3  . Insulin Pen Needle 32G X 5 MM MISC 1 each by Does not apply route every morning. 100 each 1  . levothyroxine (SYNTHROID, LEVOTHROID) 50 MCG tablet TAKE 1 TABLET BY MOUTH  DAILY BEFORE BREAKFAST 90 tablet 3  . liraglutide (VICTOZA) 18 MG/3ML SOPN INJECT SUBCUTANEOUSLY 1.'8MG'$  EVERY MORNING 27 mL 11  . metFORMIN  (GLUCOPHAGE-XR) 500 MG 24 hr tablet TAKE 2 TABLETS BY MOUTH TWO TIMES DAILY 360 tablet 3  . mirabegron ER (MYRBETRIQ) 25 MG TB24 tablet Take 25 mg by mouth daily.    . Multiple Vitamin (MULTIVITAMIN) capsule Take 1 capsule by mouth daily.      Marland Kitchen omega-3 acid ethyl esters (LOVAZA) 1 g capsule TAKE 2 CAPSULES BY MOUTH  TWO TIMES DAILY 360 capsule 3  . pioglitazone (ACTOS) 45 MG tablet TAKE 1 TABLET BY MOUTH  DAILY 90 tablet 3  . simvastatin (ZOCOR) 20 MG tablet TAKE 1 TABLET BY MOUTH AT  BEDTIME 90 tablet 3  . TURMERIC PO Take 1 tablet by mouth 2 (two) times daily.     . vitamin D, CHOLECALCIFEROL, 400 UNITS tablet Take 400 Units by mouth 2 (two) times daily.      . vitamin E 400 UNIT capsule Take 400 Units by mouth. Three times a week     No current facility-administered medications on file prior to visit.     No Known Allergies  Family History  Problem Relation Age of Onset  . Breast cancer Neg Hx   . Celiac disease Neg Hx   . Cirrhosis Neg Hx   . Clotting disorder Neg Hx   . Colitis Neg Hx   . Colon cancer Neg Hx   . Colon polyps Neg Hx   . Crohn's disease Neg Hx   . Cystic fibrosis Neg Hx   . Diabetes Neg Hx   . Esophageal cancer Neg Hx   . Heart disease Neg Hx   . Hemochromatosis Neg Hx   . Inflammatory bowel disease Neg Hx   . Irritable bowel syndrome Neg Hx   . Kidney disease Neg Hx   . Liver cancer Neg Hx   . Liver disease Neg Hx   . Ovarian cancer Neg Hx   . Pancreatic cancer Neg Hx   . Prostate cancer Neg Hx   . Rectal cancer Neg Hx   . Stomach cancer Neg Hx   . Ulcerative colitis Neg Hx   . Uterine cancer Neg Hx   . Wilson's disease Neg Hx     BP (!) 110/56   Pulse 74   Wt 135 lb 12.8 oz (61.6 kg)   SpO2 97%   BMI 20.65 kg/m  Review of Systems He denies hypoglycemia.  He has lost a few lbs.     Objective:   Physical Exam VITAL SIGNS:  See vs page GENERAL: no distress Pulses: dorsalis pedis intact bilat.   MSK: no deformity of the feet CV: no  leg edema Skin:  no ulcer on the feet.  normal color and temp on the feet. Neuro: sensation is intact to touch on the feet.    I personally reviewed electrocardiogram tracing (today): Indication: DM Impression: NSR.  No MI.  Bifascicular block.   Compared to 2017: no significant change.    Lab Results  Component Value Date   HGBA1C 6.2 06/07/2017      Assessment & Plan:  Type 2 DM: well-controlled OA of knees: steroid rx is prob affecting this a1c   Subjective:   Patient here for Medicare annual wellness visit and management of other chronic and acute problems.     Risk factors: advanced age    36 of Physicians Providing Medical Care to Patient:  See "snapshot"   Activities of Daily Living: In your present state of health, do you have any difficulty performing the following activities (lives with wife)?:  Preparing food and eating?: No  Bathing yourself: No  Getting dressed: No  Using the toilet:No  Moving around from place to place: No  In the past year have you fallen or had a near fall?:No    Home Safety: Has smoke detector and wears seat belts. No firearms. No excess sun exposure.  Opioid Use: none   Diet and Exercise  Current exercise habits: pt says good Dietary issues discussed: pt reports a healthy diet   Depression Screen  Q1: Over the past two weeks, have you felt down, depressed or hopeless? no  Q2: Over the past two weeks, have you felt little interest or pleasure in doing things? no   The following portions of the patient's history were reviewed and updated as appropriate: allergies, current medications, past family history, past medical history, past social history, past surgical history and problem list.   Review of Systems  No change in chronic hearing or visual loss.  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   Cognitive Impairment Assessment: cognition, memory and judgment appear normal.  remembers 3/3 at 5 minutes.  excellent recall.  can easily read and write a sentence.  alert and oriented x 3   Assessment:   Medicare wellness utd on preventive parameters    Plan:   During the course of the visit the patient was educated and counseled about appropriate screening and preventive services including:        Fall prevention is advised today.   Diabetes screening is UTD Nutrition counseling is offered  advanced directives/end of life addressed today:  see healthcare directives hyperlink  Vaccines are updated as needed  Patient Instructions (the written plan) was given to the patient.

## 2017-06-07 NOTE — Progress Notes (Signed)
we discussed code status.  pt requests full code, but would not want to be started or maintained on artificial life-support measures if there was not a reasonable chance of recovery 

## 2017-06-11 ENCOUNTER — Encounter: Payer: Self-pay | Admitting: Endocrinology

## 2017-06-13 ENCOUNTER — Other Ambulatory Visit: Payer: Self-pay

## 2017-06-25 ENCOUNTER — Encounter: Payer: Self-pay | Admitting: Sports Medicine

## 2017-07-25 ENCOUNTER — Encounter: Payer: Self-pay | Admitting: Endocrinology

## 2017-07-25 ENCOUNTER — Other Ambulatory Visit: Payer: Self-pay | Admitting: Endocrinology

## 2017-07-26 ENCOUNTER — Other Ambulatory Visit: Payer: Self-pay

## 2017-07-26 MED ORDER — PIOGLITAZONE HCL 45 MG PO TABS: 45.0000 mg | ORAL_TABLET | Freq: Every day | ORAL | 3 refills | Status: DC

## 2017-07-28 ENCOUNTER — Other Ambulatory Visit: Payer: Self-pay | Admitting: Endocrinology

## 2017-08-02 ENCOUNTER — Encounter: Payer: Self-pay | Admitting: Endocrinology

## 2017-08-03 ENCOUNTER — Encounter: Payer: Self-pay | Admitting: Endocrinology

## 2017-08-04 ENCOUNTER — Encounter: Payer: Self-pay | Admitting: Endocrinology

## 2017-08-05 ENCOUNTER — Other Ambulatory Visit: Payer: Self-pay | Admitting: Endocrinology

## 2017-08-05 MED ORDER — CIPROFLOXACIN HCL 500 MG PO TABS
500.0000 mg | ORAL_TABLET | Freq: Two times a day (BID) | ORAL | 0 refills | Status: DC
Start: 2017-08-05 — End: 2017-10-09

## 2017-08-17 ENCOUNTER — Encounter: Payer: Self-pay | Admitting: Endocrinology

## 2017-08-21 ENCOUNTER — Other Ambulatory Visit: Payer: Self-pay

## 2017-08-21 MED ORDER — ACCU-CHEK NANO SMARTVIEW W/DEVICE KIT: PACK | 0 refills | Status: DC

## 2017-09-07 ENCOUNTER — Encounter: Payer: Self-pay | Admitting: Endocrinology

## 2017-09-10 ENCOUNTER — Encounter: Payer: Self-pay | Admitting: Endocrinology

## 2017-09-10 ENCOUNTER — Other Ambulatory Visit: Payer: Self-pay

## 2017-09-10 MED ORDER — GLUCOSE BLOOD VI STRP: ORAL_STRIP | 3 refills | Status: DC

## 2017-09-13 ENCOUNTER — Other Ambulatory Visit: Payer: Self-pay | Admitting: Endocrinology

## 2017-09-13 DIAGNOSIS — E119 Type 2 diabetes mellitus without complications: Secondary | ICD-10-CM

## 2017-09-17 ENCOUNTER — Other Ambulatory Visit (INDEPENDENT_AMBULATORY_CARE_PROVIDER_SITE_OTHER): Payer: Medicare Other

## 2017-09-17 ENCOUNTER — Encounter: Payer: Self-pay | Admitting: Endocrinology

## 2017-09-17 DIAGNOSIS — E119 Type 2 diabetes mellitus without complications: Secondary | ICD-10-CM | POA: Diagnosis not present

## 2017-09-17 LAB — HEMOGLOBIN A1C: HEMOGLOBIN A1C: 6.9 % — AB (ref 4.6–6.5)

## 2017-09-18 ENCOUNTER — Other Ambulatory Visit: Payer: Self-pay | Admitting: Endocrinology

## 2017-09-18 MED ORDER — NATEGLINIDE 60 MG PO TABS: 60.0000 mg | ORAL_TABLET | Freq: Three times a day (TID) | ORAL | 3 refills | Status: DC

## 2017-09-19 ENCOUNTER — Other Ambulatory Visit: Payer: Self-pay | Admitting: Endocrinology

## 2017-10-07 ENCOUNTER — Encounter: Payer: Self-pay | Admitting: Endocrinology

## 2017-10-09 ENCOUNTER — Ambulatory Visit (INDEPENDENT_AMBULATORY_CARE_PROVIDER_SITE_OTHER): Payer: Medicare Other | Admitting: Endocrinology

## 2017-10-09 ENCOUNTER — Encounter: Payer: Self-pay | Admitting: Endocrinology

## 2017-10-09 VITALS — BP 106/60 | HR 77 | Ht 68.0 in | Wt 135.2 lb

## 2017-10-09 DIAGNOSIS — Z01818 Encounter for other preprocedural examination: Secondary | ICD-10-CM

## 2017-10-09 DIAGNOSIS — E119 Type 2 diabetes mellitus without complications: Secondary | ICD-10-CM | POA: Diagnosis not present

## 2017-10-09 DIAGNOSIS — M25569 Pain in unspecified knee: Secondary | ICD-10-CM

## 2017-10-09 NOTE — Progress Notes (Signed)
Subjective:    Patient ID: David Maynard, male    DOB: 08-Aug-1950, 67 y.o.   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   Dx'ed: 6789 Complications: renal insufficiency.   Therapy: victoza + 4 oral meds.  DKA: never Severe hypoglycemia: never.  Pancreatitis: never.  Other: he has never taken insulin.   Interval history: He says cbg's are well-controlled.  pt states he feels well in general. Pt will undergo right TKR on 11/04/17.  Past Medical History:  Diagnosis Date  . Depressive disorder, not elsewhere classified   . Diabetes mellitus type II   . Hypercholesterolemia   . Hypogonadism male   . Thyroid disease   . Unspecified hypothyroidism     Past Surgical History:  Procedure Laterality Date  . ESOPHAGOGASTRODUODENOSCOPY  06/16/2003  . INGUINAL HERNIA REPAIR     left  . KNEE ARTHROSCOPY     Left  . KNEE ARTHROSCOPY W/ ACL RECONSTRUCTION     left  . ROTATOR CUFF REPAIR     Right  . Stress Cardiolite  07/15/2000  . VASECTOMY  05/2002    Social History   Socioeconomic History  . Marital status: Married    Spouse name: Not on file  . Number of children: Not on file  . Years of education: Not on file  . Highest education level: Not on file  Occupational History  . Not on file  Social Needs  . Financial resource strain: Not on file  . Food insecurity:    Worry: Not on file    Inability: Not on file  . Transportation needs:    Medical: Not on file    Non-medical: Not on file  Tobacco Use  . Smoking status: Never Smoker  . Smokeless tobacco: Never Used  Substance and Sexual Activity  . Alcohol use: Yes    Alcohol/week: 1.0 standard drinks    Types: 1 Cans of beer per week  . Drug use: No  . Sexual activity: Not on file  Lifestyle  . Physical activity:    Days per week: Not on file    Minutes per session: Not on file  . Stress: Not on file    Relationships  . Social connections:    Talks on phone: Not on file    Gets together: Not on file    Attends religious service: Not on file    Active member of club or organization: Not on file    Attends meetings of clubs or organizations: Not on file    Relationship status: Not on file  . Intimate partner violence:    Fear of current or ex partner: Not on file    Emotionally abused: Not on file    Physically abused: Not on file    Forced sexual activity: Not on file  Other Topics Concern  . Not on file  Social History Narrative  . Not on file    Current Outpatient Medications on File Prior to Visit  Medication Sig Dispense Refill  . Ascorbic Acid (VITAMIN C) 500 MG tablet Take 500 mg by mouth daily.      . bromocriptine (PARLODEL) 2.5 MG tablet Take 1 tablet (2.5 mg total) by mouth daily. 90 tablet 3  . clomiPHENE (CLOMID) 50 MG tablet Take 0.5 tablets (25 mg total) by mouth daily. 45 tablet 0  .  colesevelam (WELCHOL) 625 MG tablet TAKE 2 TABLETS BY MOUTH  DAILY 180 tablet 0  . EASY TOUCH PEN NEEDLES 32G X 6 MM MISC USE ONCE DAILY IN THE  MORNING 90 each 4  . glucosamine-chondroitin 500-400 MG tablet Take 1 tablet by mouth 2 (two) times daily.      Marland Kitchen glucose blood (ACCU-CHEK GUIDE) test strip Used to check blood sugars 6 times a day. 200 each 3  . Insulin Pen Needle 32G X 5 MM MISC 1 each by Does not apply route every morning. 100 each 1  . levothyroxine (SYNTHROID, LEVOTHROID) 50 MCG tablet TAKE 1 TABLET BY MOUTH  DAILY BEFORE BREAKFAST 90 tablet 3  . liraglutide (VICTOZA) 18 MG/3ML SOPN INJECT SUBCUTANEOUSLY 1.'8MG'$  EVERY MORNING 27 mL 11  . metFORMIN (GLUCOPHAGE-XR) 500 MG 24 hr tablet TAKE 2 TABLETS BY MOUTH TWO TIMES DAILY 360 tablet 3  . mirabegron ER (MYRBETRIQ) 25 MG TB24 tablet Take 25 mg by mouth daily.    . Multiple Vitamin (MULTIVITAMIN) capsule Take 1 capsule by mouth daily.      . nateglinide (STARLIX) 60 MG tablet Take 1 tablet (60 mg total) by mouth 3 (three) times  daily with meals. 270 tablet 3  . omega-3 acid ethyl esters (LOVAZA) 1 g capsule TAKE 2 CAPSULES BY MOUTH  TWO TIMES DAILY 360 capsule 3  . pioglitazone (ACTOS) 45 MG tablet Take 1 tablet (45 mg total) by mouth daily. 90 tablet 3  . simvastatin (ZOCOR) 20 MG tablet TAKE 1 TABLET BY MOUTH AT  BEDTIME 90 tablet 3  . TURMERIC PO Take 1 tablet by mouth 2 (two) times daily.     . vitamin D, CHOLECALCIFEROL, 400 UNITS tablet Take 400 Units by mouth 2 (two) times daily.      . vitamin E 400 UNIT capsule Take 400 Units by mouth. Three times a week    . Blood Glucose Monitoring Suppl (ACCU-CHEK NANO SMARTVIEW) w/Device KIT Use to check blood sugar 6 times per day. (Patient not taking: Reported on 10/09/2017) 1 kit 0   No current facility-administered medications on file prior to visit.     No Known Allergies  Family History  Problem Relation Age of Onset  . Breast cancer Neg Hx   . Celiac disease Neg Hx   . Cirrhosis Neg Hx   . Clotting disorder Neg Hx   . Colitis Neg Hx   . Colon cancer Neg Hx   . Colon polyps Neg Hx   . Crohn's disease Neg Hx   . Cystic fibrosis Neg Hx   . Diabetes Neg Hx   . Esophageal cancer Neg Hx   . Heart disease Neg Hx   . Hemochromatosis Neg Hx   . Inflammatory bowel disease Neg Hx   . Irritable bowel syndrome Neg Hx   . Kidney disease Neg Hx   . Liver cancer Neg Hx   . Liver disease Neg Hx   . Ovarian cancer Neg Hx   . Pancreatic cancer Neg Hx   . Prostate cancer Neg Hx   . Rectal cancer Neg Hx   . Stomach cancer Neg Hx   . Ulcerative colitis Neg Hx   . Uterine cancer Neg Hx   . Wilson's disease Neg Hx     BP 106/60 (BP Location: Left Arm)   Pulse 77   Ht '5\' 8"'$  (1.727 m)   Wt 135 lb 3.2 oz (61.3 kg)   SpO2 97%   BMI 20.56 kg/m  Review of Systems Denies chest pain and sob.    Objective:   Physical Exam VITAL SIGNS:  See vs page GENERAL: no distress LUNGS:  Clear to auscultation HEART:  Regular rate and rhythm without murmurs noted. Normal  S1,S2.   Pulses: foot pulses are intact bilaterally.   MSK: no deformity of the feet or ankles.  CV: no edema of the legs or ankles Skin:  no ulcer on the feet or ankles.  normal color and temp on the feet and ankles.  Neuro: sensation is intact to touch on the feet and ankles.     Lab Results  Component Value Date   HGBA1C 6.9 (H) 09/17/2017   I personally reviewed electrocardiogram tracing (today): Indication: preop Impression: NSR.  bifascicular block Compared to 06/07/17: no significant change     Assessment & Plan:  Type 2 DM: well-controlled. Knee pain: His surgical risk is low and outweighed by the potential benefit of the surgery.  he is therefore medically cleared  Patient Instructions  Please continue the same medications. Please come back for a follow-up appointment in 4-6 months.

## 2017-10-09 NOTE — Patient Instructions (Addendum)
Please continue the same medications.   Please come back for a follow-up appointment in 4-6 months.  

## 2017-10-16 ENCOUNTER — Encounter: Payer: Self-pay | Admitting: Endocrinology

## 2017-10-17 ENCOUNTER — Other Ambulatory Visit: Payer: Self-pay | Admitting: Endocrinology

## 2017-10-17 ENCOUNTER — Encounter: Payer: Self-pay | Admitting: Endocrinology

## 2017-10-22 ENCOUNTER — Ambulatory Visit (INDEPENDENT_AMBULATORY_CARE_PROVIDER_SITE_OTHER): Payer: Medicare Other

## 2017-10-22 DIAGNOSIS — Z23 Encounter for immunization: Secondary | ICD-10-CM | POA: Diagnosis not present

## 2017-10-22 NOTE — Progress Notes (Signed)
Per orders of Dr. Everardo All injection of high dose flu given today by Jenette Rayson rma. Patient tolerated injection well.

## 2017-10-23 ENCOUNTER — Encounter (HOSPITAL_COMMUNITY): Payer: Self-pay | Admitting: Physician Assistant

## 2017-10-23 ENCOUNTER — Other Ambulatory Visit: Payer: Self-pay | Admitting: Endocrinology

## 2017-10-23 DIAGNOSIS — M1711 Unilateral primary osteoarthritis, right knee: Secondary | ICD-10-CM

## 2017-10-23 HISTORY — DX: Unilateral primary osteoarthritis, right knee: M17.11

## 2017-10-23 MED ORDER — NATEGLINIDE 60 MG PO TABS: 60.0000 mg | ORAL_TABLET | Freq: Three times a day (TID) | ORAL | 3 refills | Status: DC

## 2017-10-23 NOTE — H&P (Addendum)
TOTAL KNEE ADMISSION H&P  Patient is being admitted for right total knee arthroplasty.  Subjective:  Chief Complaint:right knee pain.  HPI: David Maynard, 67 y.o. male, has a history of pain and functional disability in the right knee due to arthritis and has failed non-surgical conservative treatments for greater than 12 weeks to includeNSAID's and/or analgesics, corticosteriod injections, viscosupplementation injections, flexibility and strengthening excercises, supervised PT with diminished ADL's post treatment, use of assistive devices and activity modification.  Onset of symptoms was gradual, starting 10 years ago with gradually worsening course since that time. The patient noted prior procedures on the knee to include  arthroscopy, menisectomy and ACL reconstruction on the right knee(s).  Patient currently rates pain in the right knee(s) at 10 out of 10 with activity. Patient has night pain, worsening of pain with activity and weight bearing, pain that interferes with activities of daily living, pain with passive range of motion, crepitus and joint swelling.  Patient has evidence of subchondral sclerosis, periarticular osteophytes and joint space narrowing by imaging studies. There is no active infection.  Patient Active Problem List   Diagnosis Date Noted  . Hematuria 03/18/2017  . Seasickness 02/16/2016  . Neck pain on right side 10/13/2015  . Dizziness and giddiness 09/22/2014  . Urinary system disease 07/05/2014  . Screening for prostate cancer 11/09/2013  . Routine general medical examination at a health care facility 11/06/2012  . Anemia 06/22/2011  . Lumbar disc disease 09/22/2010  . Diabetes mellitus, type II ([place])   . FOLLICULITIS 70/17/7939  . NUMBNESS 10/21/2009  . HEARING LOSS, RIGHT EAR 11/06/2007  . TINNITUS, RIGHT 08/21/2007  . Hypogonadism male 06/19/2007  . HYPERCHOLESTEROLEMIA 06/19/2007  . DEPRESSIVE DISORDER NOT ELSEWHERE CLASSIFIED 06/19/2007  . Hypothyroidism  03/05/2007  . Diabetes ([place]) 09/20/2006   Past Medical History:  Diagnosis Date  . Depressive disorder, not elsewhere classified   . Diabetes mellitus type II   . Hypercholesterolemia   . Hypogonadism male   . Thyroid disease   . Unspecified hypothyroidism     Past Surgical History:  Procedure Laterality Date  . ESOPHAGOGASTRODUODENOSCOPY  06/16/2003  . INGUINAL HERNIA REPAIR     left  . KNEE ARTHROSCOPY     Left  . KNEE ARTHROSCOPY W/ ACL RECONSTRUCTION     left  . ROTATOR CUFF REPAIR     Right  . Stress Cardiolite  07/15/2000  . VASECTOMY  05/2002    No current facility-administered medications for this encounter.    Current Outpatient Medications  Medication Sig Dispense Refill Last Dose  . Ascorbic Acid (VITAMIN C) 500 MG tablet Take 500 mg by mouth daily.     Taking  . bromocriptine (PARLODEL) 2.5 MG tablet Take 1 tablet (2.5 mg total) by mouth daily. 90 tablet 3 10/23/2017 at Unknown time  . clomiPHENE (CLOMID) 50 MG tablet TAKE ONE-HALF TABLET BY  MOUTH DAILY 45 tablet 0 10/23/2017 at Unknown time  . colesevelam (WELCHOL) 625 MG tablet TAKE 2 TABLETS BY MOUTH  DAILY (Patient taking differently: Take 1,250 mg by mouth daily with breakfast. ) 180 tablet 0 10/22/2017 at Unknown time  . glucosamine-chondroitin 500-400 MG tablet Take 1 tablet by mouth 2 (two) times daily.     Taking  . levothyroxine (SYNTHROID, LEVOTHROID) 50 MCG tablet TAKE 1 TABLET BY MOUTH  DAILY BEFORE BREAKFAST (Patient taking differently: Take 50 mcg by mouth daily before breakfast. TAKE 1 TABLET BY MOUTH  DAILY BEFORE BREAKFAST) 90 tablet 3 10/23/2017 at Unknown time  .  liraglutide (VICTOZA) 18 MG/3ML SOPN INJECT SUBCUTANEOUSLY 1.8MG EVERY MORNING (Patient taking differently: Inject 1.8 mg into the skin daily. INJECT SUBCUTANEOUSLY 1.8MG EVERY MORNING) 27 mL 11 10/23/2017 at Unknown time  . metFORMIN (GLUCOPHAGE-XR) 500 MG 24 hr tablet TAKE 2 TABLETS BY MOUTH TWO TIMES DAILY (Patient taking differently: Take  1,000 mg by mouth 2 (two) times daily. TAKE 2 TABLETS BY MOUTH TWO TIMES DAILY) 360 tablet 3 10/23/2017 at Unknown time  . mirabegron ER (MYRBETRIQ) 25 MG TB24 tablet Take 25 mg by mouth daily.   10/23/2017 at Unknown time  . Multiple Vitamin (MULTIVITAMIN) capsule Take 1 capsule by mouth daily.     Taking  . nateglinide (STARLIX) 60 MG tablet Take 1 tablet (60 mg total) by mouth 3 (three) times daily with meals. 270 tablet 3 10/23/2017 at Unknown time  . omega-3 acid ethyl esters (LOVAZA) 1 g capsule TAKE 2 CAPSULES BY MOUTH  TWO TIMES DAILY (Patient taking differently: Take 2 g by mouth 2 (two) times daily. TAKE 2 CAPSULES BY MOUTH  TWO TIMES DAILY) 360 capsule 3 10/23/2017 at Unknown time  . pioglitazone (ACTOS) 45 MG tablet Take 1 tablet (45 mg total) by mouth daily. 90 tablet 3 10/23/2017 at Unknown time  . simvastatin (ZOCOR) 20 MG tablet TAKE 1 TABLET BY MOUTH AT  BEDTIME (Patient taking differently: Take 20 mg by mouth at bedtime. ) 90 tablet 3 10/22/2017 at Unknown time  . TURMERIC PO Take 2 capsules by mouth 2 (two) times daily.    Taking  . vitamin D, CHOLECALCIFEROL, 400 UNITS tablet Take 400 Units by mouth 2 (two) times daily.     Taking  . vitamin E 400 UNIT capsule Take 400 Units by mouth 3 (three) times a week.    Taking  . Blood Glucose Monitoring Suppl (ACCU-CHEK NANO SMARTVIEW) w/Device KIT Use to check blood sugar 6 times per day. 1 kit 0 Not Taking  . EASY TOUCH PEN NEEDLES 32G X 6 MM MISC USE ONCE DAILY IN THE  MORNING 90 each 4 Taking  . glucose blood (ACCU-CHEK GUIDE) test strip Used to check blood sugars 6 times a day. 200 each 3 Taking  . Insulin Pen Needle 32G X 5 MM MISC 1 each by Does not apply route every morning. 100 each 1 Taking   No Known Allergies  Social History   Tobacco Use  . Smoking status: Never Smoker  . Smokeless tobacco: Never Used  Substance Use Topics  . Alcohol use: Yes    Alcohol/week: 1.0 standard drinks    Types: 1 Cans of beer per week    Family  History  Problem Relation Age of Onset  . Breast cancer Neg Hx   . Celiac disease Neg Hx   . Cirrhosis Neg Hx   . Clotting disorder Neg Hx   . Colitis Neg Hx   . Colon cancer Neg Hx   . Colon polyps Neg Hx   . Crohn's disease Neg Hx   . Cystic fibrosis Neg Hx   . Diabetes Neg Hx   . Esophageal cancer Neg Hx   . Heart disease Neg Hx   . Hemochromatosis Neg Hx   . Inflammatory bowel disease Neg Hx   . Irritable bowel syndrome Neg Hx   . Kidney disease Neg Hx   . Liver cancer Neg Hx   . Liver disease Neg Hx   . Ovarian cancer Neg Hx   . Pancreatic cancer Neg Hx   . Prostate  cancer Neg Hx   . Rectal cancer Neg Hx   . Stomach cancer Neg Hx   . Ulcerative colitis Neg Hx   . Uterine cancer Neg Hx   . Wilson's disease Neg Hx      Review of Systems  Constitutional: Negative.   HENT: Negative.   Eyes: Negative.   Respiratory: Negative.   Cardiovascular: Negative.   Gastrointestinal: Negative.   Genitourinary: Negative.   Musculoskeletal: Positive for joint pain.  Skin: Negative.   Neurological: Negative.   Endo/Heme/Allergies: Negative.   Psychiatric/Behavioral: Negative.     Objective:  Physical Exam  Constitutional: He is oriented to person, place, and time. He appears well-developed and well-nourished.  HENT:  Head: Normocephalic and atraumatic.  Mouth/Throat: Oropharynx is clear and moist.  Eyes: Pupils are equal, round, and reactive to light.  Neck: Normal range of motion. Neck supple.  Cardiovascular: Normal rate and regular rhythm.  Respiratory: Effort normal and breath sounds normal.  GI: Soft.  Genitourinary:  Genitourinary Comments: Not pertinent to current symptomatology therefore not examined.  Musculoskeletal:  Examination of both knees reveal pain bilaterally.  Moderate varus deformity.  1+ synovitis.  Range of motion -5 to 125 degrees.  Knee is stable with normal patella tracking.  Vascular exam: Pulses are 2+ and symmetric.    Neurological: He is  alert and oriented to person, place, and time.  Skin: Skin is warm and dry.  Psychiatric: He has a normal mood and affect. His behavior is normal.    Vital signs in last 24 hours:    Labs:   Estimated body mass index is 20.56 kg/m as calculated from the following:   Height as of 10/09/17: '5\' 8"'$  (1.727 m).   Weight as of 10/09/17: 61.3 kg.   Imaging Review Plain radiographs demonstrate severe degenerative joint disease of the right knee(s). The overall alignment issignificant varus. The bone quality appears to be good for age and reported activity level.   Preoperative templating of the joint replacement has been completed, documented, and submitted to the Operating Room personnel in order to optimize intra-operative equipment management.    Patient's anticipated LOS is less than 2 midnights, meeting these requirements: - Younger than 3 - Lives within 1 hour of care - Has a competent adult at home to recover with post-op recover - NO history of  - Chronic pain requiring opiods  - Diabetes  - Coronary Artery Disease  - Heart failure  - Heart attack  - Stroke  - DVT/VTE  - Cardiac arrhythmia  - Respiratory Failure/COPD  - Renal failure  - Anemia  - Advanced Liver disease        Assessment/Plan:  End stage arthritis, right knee  Principal Problem:   Primary localized osteoarthritis of right knee Active Problems:   Hypothyroidism   HYPERCHOLESTEROLEMIA   Diabetes mellitus, type II ([place])   Lumbar disc disease  The patient history, physical examination, clinical judgment of the provider and imaging studies are consistent with end stage degenerative joint disease of the right knee(s) and total knee arthroplasty is deemed medically necessary. The treatment options including medical management, injection therapy arthroscopy and arthroplasty were discussed at length. The risks and benefits of total knee arthroplasty were presented and reviewed. The risks due to aseptic  loosening, infection, stiffness, patella tracking problems, thromboembolic complications and other imponderables were discussed. The patient acknowledged the explanation, agreed to proceed with the plan and consent was signed. Patient is being admitted for inpatient treatment for surgery, pain  control, PT, OT, prophylactic antibiotics, VTE prophylaxis, progressive ambulation and ADL's and discharge planning. The patient is planning to be discharged home with home health services

## 2017-10-23 NOTE — Pre-Procedure Instructions (Signed)
TAKASHI KOROL  10/23/2017      [id number] [place], [place] - [address] AT [facility] [address] [place] [address] Phone: [phone] Fax: [phone]  [facility] - [place], [place] - [address] [facility] [address] [place] [address] [place] [address] Phone: [phone] Fax: [phone]    Your procedure is scheduled on 11/04/17.  Report to [facility] at 8 A.M.  Call this number if you have problems the morning of surgery:  [phone]   Remember:  Do not eat or drink after midnight. Y    Take these medicines the morning of surgery with A SIP OF WATER ---synthroid    Do not wear jewelry, make-up or nail polish.  Do not wear lotions, powders, or perfumes, or deodorant.  Do not shave 48 hours prior to surgery.  Men may shave face and neck.  Do not bring valuables to the hospital.  [facility] is not responsible for any belongings or valuables.  Contacts, dentures or bridgework may not be worn into surgery.  Leave your suitcase in the car.  After surgery it may be brought to your room.  For patients admitted to the hospital, discharge time will be determined by your treatment team.  Patients discharged the day of surgery will not be allowed to drive home.   Name and phone number of your driver:   Do not take any aspirin,anti-inflammatories,vitamins,or herbal supplements 5-7 days prior to surgery. Special instructions:  [place] - Preparing for Surgery  Before surgery, you can play an important role.  Because skin is not sterile, your skin needs to be as free of germs as possible.  You can reduce the number of germs on you skin by washing with CHG (chlorahexidine gluconate) soap before surgery.  CHG is an antiseptic cleaner which kills germs and bonds with the skin to continue killing germs even after washing.  Oral Hygiene is also important in reducing the risk of infection.   Remember to brush your teeth with your regular toothpaste the morning of surgery.  Please DO NOT use if you have an allergy to CHG or antibacterial soaps.  If your skin becomes reddened/irritated stop using the CHG and inform your nurse when you arrive at Short Stay.  Do not shave (including legs and underarms) for at least 48 hours prior to the first CHG shower.  You may shave your face.  Please follow these instructions carefully:   1.  Shower with CHG Soap the night before surgery and the morning of Surgery.  2.  If you choose to wash your hair, wash your hair first as usual with your normal shampoo.  3.  After you shampoo, rinse your hair and body thoroughly to remove the shampoo. 4.  Use CHG as you would any other liquid soap.  You can apply chg directly to the skin and wash gently with a      scrungie or washcloth.           5.  Apply the CHG Soap to your body ONLY FROM THE NECK DOWN.   Do not use on open wounds or open sores. Avoid contact with your eyes, ears, mouth and genitals (private parts).  Wash genitals (private parts) with your normal soap.  6.  Wash thoroughly, paying special attention to the area where your surgery will be performed.  7.  Thoroughly rinse your body  with warm water from the neck down.  8.  DO NOT shower/wash with your normal soap after using and rinsing off the CHG Soap.  9.  Pat yourself dry with a clean towel.            10.  Wear clean pajamas.            11.  Place clean sheets on your bed the night of your first shower and do not sleep with pets.  Day of Surgery  Do not apply any lotions/deoderants the morning of surgery.   Please wear clean clothes to the hospital/surgery center. Remember to brush your teeth with toothpaste.    Please read over the following fact sheets that you were given. MRSA Information

## 2017-10-24 ENCOUNTER — Encounter: Payer: Self-pay | Admitting: Endocrinology

## 2017-10-24 ENCOUNTER — Other Ambulatory Visit: Payer: Self-pay | Admitting: Endocrinology

## 2017-10-24 ENCOUNTER — Encounter (HOSPITAL_COMMUNITY)
Admission: RE | Admit: 2017-10-24 | Discharge: 2017-10-24 | Disposition: A | Discharge status: Home / Self Care | Payer: Medicare Other | Source: Ambulatory Visit | Attending: Orthopedic Surgery | Admitting: Orthopedic Surgery

## 2017-10-24 ENCOUNTER — Encounter (HOSPITAL_COMMUNITY): Payer: Self-pay

## 2017-10-24 DIAGNOSIS — Z79890 Hormone replacement therapy (postmenopausal): Secondary | ICD-10-CM | POA: Insufficient documentation

## 2017-10-24 DIAGNOSIS — E7800 Pure hypercholesterolemia, unspecified: Secondary | ICD-10-CM | POA: Diagnosis not present

## 2017-10-24 DIAGNOSIS — E039 Hypothyroidism, unspecified: Secondary | ICD-10-CM | POA: Diagnosis not present

## 2017-10-24 DIAGNOSIS — M1711 Unilateral primary osteoarthritis, right knee: Secondary | ICD-10-CM | POA: Insufficient documentation

## 2017-10-24 DIAGNOSIS — Z833 Family history of diabetes mellitus: Secondary | ICD-10-CM | POA: Insufficient documentation

## 2017-10-24 DIAGNOSIS — Z7984 Long term (current) use of oral hypoglycemic drugs: Secondary | ICD-10-CM | POA: Diagnosis not present

## 2017-10-24 DIAGNOSIS — E119 Type 2 diabetes mellitus without complications: Secondary | ICD-10-CM | POA: Insufficient documentation

## 2017-10-24 DIAGNOSIS — F329 Major depressive disorder, single episode, unspecified: Secondary | ICD-10-CM | POA: Insufficient documentation

## 2017-10-24 DIAGNOSIS — Z79899 Other long term (current) drug therapy: Secondary | ICD-10-CM | POA: Diagnosis not present

## 2017-10-24 DIAGNOSIS — Z01818 Encounter for other preprocedural examination: Secondary | ICD-10-CM | POA: Diagnosis not present

## 2017-10-24 LAB — COMPREHENSIVE METABOLIC PANEL
ALBUMIN: 3.6 g/dL (ref 3.5–5.0)
ALT: 18 U/L (ref 0–44)
AST: 27 U/L (ref 15–41)
Alkaline Phosphatase: 39 U/L (ref 38–126)
Anion gap: 8 (ref 5–15)
BUN: 30 mg/dL — ABNORMAL HIGH (ref 8–23)
CHLORIDE: 104 mmol/L (ref 98–111)
CO2: 26 mmol/L (ref 22–32)
CREATININE: 1.04 mg/dL (ref 0.61–1.24)
Calcium: 9.4 mg/dL (ref 8.9–10.3)
GFR calc Af Amer: >60 mL/min (ref >60)
GFR calc non Af Amer: >60 mL/min (ref >60)
GLUCOSE: 121 mg/dL — AB (ref 70–99)
Potassium: 4 mmol/L (ref 3.5–5.1)
SODIUM: 138 mmol/L (ref 135–145)
Total Bilirubin: 0.3 mg/dL (ref 0.3–1.2)
Total Protein: 6.1 g/dL — ABNORMAL LOW (ref 6.5–8.1)

## 2017-10-24 LAB — CBC WITH DIFFERENTIAL/PLATELET
ABS IMMATURE GRANULOCYTES: 0 10*3/uL (ref 0.0–0.1)
BASOS ABS: 0 10*3/uL (ref 0.0–0.1)
BASOS PCT: 1 %
Eosinophils Absolute: 0.2 10*3/uL (ref 0.0–0.7)
Eosinophils Relative: 3 %
HCT: 40.8 % (ref 39.0–52.0)
Hemoglobin: 13.1 g/dL (ref 13.0–17.0)
IMMATURE GRANULOCYTES: 1 %
Lymphocytes Relative: 21 %
Lymphs Abs: 1.7 10*3/uL (ref 0.7–4.0)
MCH: 29.9 pg (ref 26.0–34.0)
MCHC: 32.1 g/dL (ref 30.0–36.0)
MCV: 93.2 fL (ref 78.0–100.0)
Monocytes Absolute: 0.5 10*3/uL (ref 0.1–1.0)
Monocytes Relative: 6 %
NEUTROS PCT: 68 %
Neutro Abs: 5.6 10*3/uL (ref 1.7–7.7)
PLATELETS: 210 10*3/uL (ref 150–400)
RBC: 4.38 MIL/uL (ref 4.22–5.81)
RDW: 13.5 % (ref 11.5–15.5)
WBC: 8.1 10*3/uL (ref 4.0–10.5)

## 2017-10-24 LAB — TYPE AND SCREEN
ABO/RH(D): O POS
Antibody Screen: NEGATIVE

## 2017-10-24 LAB — SURGICAL PCR SCREEN
MRSA, PCR: NEGATIVE
Staphylococcus aureus: NEGATIVE

## 2017-10-24 LAB — PROTIME-INR
INR: 1.01
Prothrombin Time: 13.2 seconds (ref 11.4–15.2)

## 2017-10-24 LAB — GLUCOSE, CAPILLARY: GLUCOSE-CAPILLARY: 124 mg/dL — AB (ref 70–99)

## 2017-10-24 LAB — APTT: APTT: 31 s (ref 24–36)

## 2017-10-24 LAB — ABO/RH: ABO/RH(D): O POS

## 2017-10-24 NOTE — Final Progress Note (Signed)
Pt has appt. With Dr Tresa Endo tomorrow for ekg evaluation

## 2017-10-25 ENCOUNTER — Other Ambulatory Visit: Payer: Self-pay | Admitting: Physician Assistant

## 2017-10-25 ENCOUNTER — Other Ambulatory Visit: Payer: Self-pay

## 2017-10-25 NOTE — Progress Notes (Addendum)
Anesthesia Chart Review:  Case:  [id number] Date/Time:  11/04/17 0939   Procedure:  TOTAL KNEE ARTHROPLASTY (Right )   Anesthesia type:  Spinal   Pre-op diagnosis:  djd right knee   Location:  [facility] OR ROOM 07 / [place] OR   Surgeon:  Elsie Saas, MD      DISCUSSION: 67 yo male never smoker. Pertinent hx includes DMII, Hypothyroid. Depression.  Medical clearance from Dr. Loanne Drilling 10/09/2017 "His surgical risk is low and outweighed by the potential benefit of the surgery.  he is therefore medically cleared".  The pt reported at his PAT appt. That he was going to see Jeri Lager, PA-C with [facility] on 10/28/2017 for another eval of abnormal EKG with bifascicular block. Pt reports he was told that the abnormality has been stable for several years and no further workup indicated.  Cardiac clearance by Jeri Lager, NP 10/28/2017 states "Good functional capacity, risk factors well controlled.  Can be taken for surgery with acceptable cardiovascular risk.  EKG unchanged from 2011."  Anticipate he can proceed as planned barring acute status change.  VS: BP 109/62   Pulse 78   Temp 36.7 C (Oral)   Ht '5\' 8"'$  (1.727 m)   Wt 59.9 kg   SpO2 100%   BMI 20.09 kg/m   PROVIDERS: Renato Shin, MD is PCP  Adrian Prows, MD is Cardiologist   LABS: Labs reviewed: Acceptable for surgery. Urine culture results called to Dr. Archie Endo office.  (all labs ordered are listed, but only abnormal results are displayed)  Labs Reviewed  URINE CULTURE - Abnormal; Notable for the following components:      Result Value   Culture >=100,000 COLONIES/mL ESCHERICHIA COLI (*)    All other components within normal limits  GLUCOSE, CAPILLARY - Abnormal; Notable for the following components:   Glucose-Capillary 124 (*)    All other components within normal limits  COMPREHENSIVE METABOLIC PANEL - Abnormal; Notable for the following components:   Glucose, Bld 121 (*)    BUN 30 (*)    Total Protein 6.1 (*)     All other components within normal limits  SURGICAL PCR SCREEN  APTT  CBC WITH DIFFERENTIAL/PLATELET  PROTIME-INR  TYPE AND SCREEN  ABO/RH     IMAGES: N/A   EKG: 10/09/2017: NSR. Bifascicular block 06/07/2017: NSR. Bifascicular block  CV: TTE 10/04/2014: Study Conclusions  - Left ventricle: The cavity size was normal. Wall thickness was   normal. Systolic function was normal. The estimated ejection   fraction was in the range of 55% to 60%. Wall motion was normal;   there were no regional wall motion abnormalities. Left   ventricular diastolic function parameters were normal.   Past Medical History:  Diagnosis Date  . Depressive disorder, not elsewhere classified    none  . Diabetes mellitus type II   . Hypercholesterolemia   . Hypogonadism male   . Primary localized osteoarthritis of right knee 10/23/2017  . Thyroid disease   . Unspecified hypothyroidism     Past Surgical History:  Procedure Laterality Date  . ESOPHAGOGASTRODUODENOSCOPY  06/16/2003  . INGUINAL HERNIA REPAIR     left  . JOINT REPLACEMENT    . KNEE ARTHROSCOPY     Left  . KNEE ARTHROSCOPY W/ ACL RECONSTRUCTION     left  . ROTATOR CUFF REPAIR     Right  . Stress Cardiolite  07/15/2000  . VASECTOMY  05/2002    MEDICATIONS: . tadalafil (ADCIRCA/CIALIS) 20 MG tablet  .  Ascorbic Acid (VITAMIN C) 500 MG tablet  . Blood Glucose Monitoring Suppl (ACCU-CHEK NANO SMARTVIEW) w/Device KIT  . bromocriptine (PARLODEL) 2.5 MG tablet  . clomiPHENE (CLOMID) 50 MG tablet  . colesevelam (WELCHOL) 625 MG tablet  . EASY TOUCH PEN NEEDLES 32G X 6 MM MISC  . glucosamine-chondroitin 500-400 MG tablet  . glucose blood (ACCU-CHEK GUIDE) test strip  . Insulin Pen Needle 32G X 5 MM MISC  . levothyroxine (SYNTHROID, LEVOTHROID) 50 MCG tablet  . liraglutide (VICTOZA) 18 MG/3ML SOPN  . metFORMIN (GLUCOPHAGE-XR) 500 MG 24 hr tablet  . mirabegron ER (MYRBETRIQ) 25 MG TB24 tablet  . Multiple Vitamin (MULTIVITAMIN)  capsule  . nateglinide (STARLIX) 60 MG tablet  . omega-3 acid ethyl esters (LOVAZA) 1 g capsule  . pioglitazone (ACTOS) 45 MG tablet  . simvastatin (ZOCOR) 20 MG tablet  . TURMERIC PO  . vitamin D, CHOLECALCIFEROL, 400 UNITS tablet  . vitamin E 400 UNIT capsule   No current facility-administered medications for this encounter.      Wynonia Musty [facility] Short Stay Center/Anesthesiology Phone [phone] 10/25/2017 2:23 PM

## 2017-10-26 LAB — URINE CULTURE: Culture: >=100000 — AB

## 2017-11-01 MED ORDER — BUPIVACAINE LIPOSOME 1.3 % IJ SUSP
20.0000 mL | INTRAMUSCULAR | Status: DC
  Filled 2017-11-01: qty 20

## 2017-11-01 MED ORDER — TRANEXAMIC ACID-NACL 1000-0.7 MG/100ML-% IV SOLN
1000.0000 mg | INTRAVENOUS | Status: AC
  Administered 2017-11-04: 1000 mg via INTRAVENOUS
  Filled 2017-11-01: qty 100

## 2017-11-04 ENCOUNTER — Inpatient Hospital Stay (HOSPITAL_COMMUNITY): Payer: Medicare Other | Admitting: Physician Assistant

## 2017-11-04 ENCOUNTER — Inpatient Hospital Stay (HOSPITAL_COMMUNITY)
Admission: RE | Admit: 2017-11-04 | Discharge: 2017-11-05 | DRG: 470 | Disposition: A | Discharge status: Home Health | Payer: Medicare Other | Attending: Orthopedic Surgery | Admitting: Orthopedic Surgery

## 2017-11-04 ENCOUNTER — Other Ambulatory Visit: Payer: Self-pay | Admitting: Endocrinology

## 2017-11-04 ENCOUNTER — Encounter (HOSPITAL_COMMUNITY): Payer: Self-pay | Admitting: Anesthesiology

## 2017-11-04 ENCOUNTER — Inpatient Hospital Stay (HOSPITAL_COMMUNITY): Payer: Medicare Other | Admitting: Anesthesiology

## 2017-11-04 ENCOUNTER — Other Ambulatory Visit: Payer: Self-pay

## 2017-11-04 ENCOUNTER — Inpatient Hospital Stay (HOSPITAL_COMMUNITY): Payer: Medicare Other

## 2017-11-04 ENCOUNTER — Encounter (HOSPITAL_COMMUNITY)
Admission: RE | Disposition: A | Discharge status: Home / Self Care | Payer: Self-pay | Source: Home / Self Care | Attending: Orthopedic Surgery

## 2017-11-04 DIAGNOSIS — Z419 Encounter for procedure for purposes other than remedying health state, unspecified: Secondary | ICD-10-CM

## 2017-11-04 DIAGNOSIS — M25761 Osteophyte, right knee: Secondary | ICD-10-CM | POA: Diagnosis present

## 2017-11-04 DIAGNOSIS — Z7984 Long term (current) use of oral hypoglycemic drugs: Secondary | ICD-10-CM

## 2017-11-04 DIAGNOSIS — H9191 Unspecified hearing loss, right ear: Secondary | ICD-10-CM | POA: Diagnosis present

## 2017-11-04 DIAGNOSIS — M519 Unspecified thoracic, thoracolumbar and lumbosacral intervertebral disc disorder: Secondary | ICD-10-CM | POA: Diagnosis present

## 2017-11-04 DIAGNOSIS — E1165 Type 2 diabetes mellitus with hyperglycemia: Secondary | ICD-10-CM

## 2017-11-04 DIAGNOSIS — Z79890 Hormone replacement therapy (postmenopausal): Secondary | ICD-10-CM

## 2017-11-04 DIAGNOSIS — M1711 Unilateral primary osteoarthritis, right knee: Principal | ICD-10-CM | POA: Diagnosis present

## 2017-11-04 DIAGNOSIS — E785 Hyperlipidemia, unspecified: Secondary | ICD-10-CM

## 2017-11-04 DIAGNOSIS — E039 Hypothyroidism, unspecified: Secondary | ICD-10-CM | POA: Diagnosis present

## 2017-11-04 DIAGNOSIS — E119 Type 2 diabetes mellitus without complications: Secondary | ICD-10-CM | POA: Diagnosis present

## 2017-11-04 DIAGNOSIS — E1169 Type 2 diabetes mellitus with other specified complication: Secondary | ICD-10-CM | POA: Diagnosis present

## 2017-11-04 DIAGNOSIS — Z79899 Other long term (current) drug therapy: Secondary | ICD-10-CM

## 2017-11-04 DIAGNOSIS — E7800 Pure hypercholesterolemia, unspecified: Secondary | ICD-10-CM | POA: Diagnosis present

## 2017-11-04 DIAGNOSIS — E291 Testicular hypofunction: Secondary | ICD-10-CM | POA: Diagnosis present

## 2017-11-04 HISTORY — DX: Unilateral primary osteoarthritis, right knee: M17.11

## 2017-11-04 HISTORY — PX: TOTAL KNEE ARTHROPLASTY: SHX125

## 2017-11-04 LAB — GLUCOSE, CAPILLARY
GLUCOSE-CAPILLARY: 262 mg/dL — AB (ref 70–99)
Glucose-Capillary: 116 mg/dL — ABNORMAL HIGH (ref 70–99)
Glucose-Capillary: 129 mg/dL — ABNORMAL HIGH (ref 70–99)
Glucose-Capillary: 138 mg/dL — ABNORMAL HIGH (ref 70–99)

## 2017-11-04 LAB — HEMOGLOBIN A1C
Hgb A1c MFr Bld: 6.6 % — ABNORMAL HIGH (ref 4.8–5.6)
Mean Plasma Glucose: 142.72 mg/dL

## 2017-11-04 SURGERY — ARTHROPLASTY, KNEE, TOTAL
Anesthesia: Spinal | Laterality: Right

## 2017-11-04 MED ORDER — OXYCODONE HCL 5 MG PO TABS
5.0000 mg | ORAL_TABLET | ORAL | Status: DC | PRN
  Administered 2017-11-04 – 2017-11-05 (×5): 10 mg via ORAL
  Filled 2017-11-04 (×5): qty 2

## 2017-11-04 MED ORDER — LIRAGLUTIDE 18 MG/3ML ~~LOC~~ SOPN: 1.8000 mg | PEN_INJECTOR | Freq: Every day | SUBCUTANEOUS | Status: DC

## 2017-11-04 MED ORDER — LACTATED RINGERS IV SOLN
INTRAVENOUS | Status: DC
  Administered 2017-11-04: 09:00:00 via INTRAVENOUS

## 2017-11-04 MED ORDER — PHENYLEPHRINE 40 MCG/ML (10ML) SYRINGE FOR IV PUSH (FOR BLOOD PRESSURE SUPPORT)
PREFILLED_SYRINGE | INTRAVENOUS | Status: AC
  Filled 2017-11-04: qty 10

## 2017-11-04 MED ORDER — FENTANYL CITRATE (PF) 100 MCG/2ML IJ SOLN: 25.0000 ug | INTRAMUSCULAR | Status: DC | PRN

## 2017-11-04 MED ORDER — BROMOCRIPTINE MESYLATE 2.5 MG PO TABS
2.5000 mg | ORAL_TABLET | Freq: Every day | ORAL | Status: DC
  Administered 2017-11-04 – 2017-11-05 (×2): 2.5 mg via ORAL
  Filled 2017-11-04 (×2): qty 1

## 2017-11-04 MED ORDER — BUPIVACAINE-EPINEPHRINE 0.5% -1:200000 IJ SOLN
INTRAMUSCULAR | Status: AC
  Filled 2017-11-04: qty 1

## 2017-11-04 MED ORDER — 0.9 % SODIUM CHLORIDE (POUR BTL) OPTIME
TOPICAL | Status: DC | PRN
  Administered 2017-11-04: 1000 mL

## 2017-11-04 MED ORDER — MIDAZOLAM HCL 2 MG/2ML IJ SOLN
INTRAMUSCULAR | Status: AC
  Filled 2017-11-04: qty 2

## 2017-11-04 MED ORDER — FENTANYL CITRATE (PF) 250 MCG/5ML IJ SOLN
INTRAMUSCULAR | Status: AC
  Filled 2017-11-04: qty 5

## 2017-11-04 MED ORDER — ASPIRIN EC 325 MG PO TBEC
325.0000 mg | DELAYED_RELEASE_TABLET | Freq: Every day | ORAL | Status: DC
  Administered 2017-11-05: 325 mg via ORAL
  Filled 2017-11-04: qty 1

## 2017-11-04 MED ORDER — PROPOFOL 500 MG/50ML IV EMUL
INTRAVENOUS | Status: DC | PRN
  Administered 2017-11-04: 75 ug/kg/min via INTRAVENOUS
  Administered 2017-11-04: 11:00:00 via INTRAVENOUS

## 2017-11-04 MED ORDER — LIDOCAINE 2% (20 MG/ML) 5 ML SYRINGE
INTRAMUSCULAR | Status: DC | PRN
  Administered 2017-11-04: 20 mg via INTRAVENOUS

## 2017-11-04 MED ORDER — OXYCODONE HCL 5 MG PO TABS: 5.0000 mg | ORAL_TABLET | Freq: Once | ORAL | Status: DC | PRN

## 2017-11-04 MED ORDER — MIDAZOLAM HCL 2 MG/2ML IJ SOLN
INTRAMUSCULAR | Status: AC
  Administered 2017-11-04: 1 mg via INTRAVENOUS
  Filled 2017-11-04: qty 2

## 2017-11-04 MED ORDER — SIMVASTATIN 20 MG PO TABS
20.0000 mg | ORAL_TABLET | Freq: Every day | ORAL | Status: DC
  Administered 2017-11-04: 20 mg via ORAL
  Filled 2017-11-04: qty 1

## 2017-11-04 MED ORDER — CEFAZOLIN SODIUM-DEXTROSE 2-4 GM/100ML-% IV SOLN
2.0000 g | INTRAVENOUS | Status: AC
  Administered 2017-11-04: 2 g via INTRAVENOUS
  Filled 2017-11-04: qty 100

## 2017-11-04 MED ORDER — ACETAMINOPHEN 500 MG PO TABS
1000.0000 mg | ORAL_TABLET | Freq: Four times a day (QID) | ORAL | Status: AC
  Administered 2017-11-04 – 2017-11-05 (×4): 1000 mg via ORAL
  Filled 2017-11-04 (×4): qty 2

## 2017-11-04 MED ORDER — CEFAZOLIN SODIUM-DEXTROSE 2-4 GM/100ML-% IV SOLN
2.0000 g | Freq: Four times a day (QID) | INTRAVENOUS | Status: AC
  Administered 2017-11-04 (×2): 2 g via INTRAVENOUS
  Filled 2017-11-04 (×2): qty 100

## 2017-11-04 MED ORDER — SODIUM CHLORIDE 0.9 % IJ SOLN
INTRAMUSCULAR | Status: DC | PRN
  Administered 2017-11-04: 50 mL

## 2017-11-04 MED ORDER — CEFUROXIME SODIUM 750 MG IJ SOLR
INTRAMUSCULAR | Status: AC
  Filled 2017-11-04: qty 1500

## 2017-11-04 MED ORDER — PROPOFOL 10 MG/ML IV BOLUS
INTRAVENOUS | Status: DC | PRN
  Administered 2017-11-04: 20 mg via INTRAVENOUS

## 2017-11-04 MED ORDER — CHLORHEXIDINE GLUCONATE 4 % EX LIQD: 60.0000 mL | Freq: Once | CUTANEOUS | Status: DC

## 2017-11-04 MED ORDER — POTASSIUM CHLORIDE IN NACL 20-0.9 MEQ/L-% IV SOLN
INTRAVENOUS | Status: DC
  Administered 2017-11-04: 18:00:00 via INTRAVENOUS
  Filled 2017-11-04: qty 1000

## 2017-11-04 MED ORDER — DEXAMETHASONE SODIUM PHOSPHATE 10 MG/ML IJ SOLN
INTRAMUSCULAR | Status: AC
  Filled 2017-11-04: qty 2

## 2017-11-04 MED ORDER — DEXAMETHASONE SODIUM PHOSPHATE 10 MG/ML IJ SOLN
INTRAMUSCULAR | Status: DC | PRN
  Administered 2017-11-04: 10 mg via INTRAVENOUS

## 2017-11-04 MED ORDER — METOCLOPRAMIDE HCL 5 MG/ML IJ SOLN
5.0000 mg | Freq: Three times a day (TID) | INTRAMUSCULAR | Status: DC | PRN
  Filled 2017-11-04: qty 2

## 2017-11-04 MED ORDER — PIOGLITAZONE HCL 45 MG PO TABS
45.0000 mg | ORAL_TABLET | Freq: Every day | ORAL | Status: DC
  Administered 2017-11-05: 45 mg via ORAL
  Filled 2017-11-04: qty 1

## 2017-11-04 MED ORDER — CEFUROXIME SODIUM 750 MG IJ SOLR
INTRAMUSCULAR | Status: DC | PRN
  Administered 2017-11-04 (×2): 750 mg

## 2017-11-04 MED ORDER — MIDAZOLAM HCL 5 MG/5ML IJ SOLN
INTRAMUSCULAR | Status: DC | PRN
  Administered 2017-11-04: 1 mg via INTRAVENOUS

## 2017-11-04 MED ORDER — MIDAZOLAM HCL 2 MG/2ML IJ SOLN
1.0000 mg | Freq: Once | INTRAMUSCULAR | Status: AC
  Administered 2017-11-04: 1 mg via INTRAVENOUS

## 2017-11-04 MED ORDER — OXYCODONE HCL 5 MG/5ML PO SOLN: 5.0000 mg | Freq: Once | ORAL | Status: DC | PRN

## 2017-11-04 MED ORDER — DOCUSATE SODIUM 100 MG PO CAPS
100.0000 mg | ORAL_CAPSULE | Freq: Two times a day (BID) | ORAL | Status: DC
  Administered 2017-11-04 – 2017-11-05 (×2): 100 mg via ORAL
  Filled 2017-11-04 (×2): qty 1

## 2017-11-04 MED ORDER — LIDOCAINE 2% (20 MG/ML) 5 ML SYRINGE
INTRAMUSCULAR | Status: AC
  Filled 2017-11-04: qty 5

## 2017-11-04 MED ORDER — ONDANSETRON HCL 4 MG PO TABS: 4.0000 mg | ORAL_TABLET | Freq: Four times a day (QID) | ORAL | Status: DC | PRN

## 2017-11-04 MED ORDER — BUPIVACAINE LIPOSOME 1.3 % IJ SUSP
INTRAMUSCULAR | Status: DC | PRN
  Administered 2017-11-04: 20 mL

## 2017-11-04 MED ORDER — ONDANSETRON HCL 4 MG/2ML IJ SOLN
4.0000 mg | Freq: Four times a day (QID) | INTRAMUSCULAR | Status: DC | PRN
  Administered 2017-11-05: 4 mg via INTRAVENOUS
  Filled 2017-11-04: qty 2

## 2017-11-04 MED ORDER — INSULIN ASPART 100 UNIT/ML ~~LOC~~ SOLN
0.0000 [IU] | Freq: Three times a day (TID) | SUBCUTANEOUS | Status: DC
  Administered 2017-11-04: 3 [IU] via SUBCUTANEOUS
  Administered 2017-11-05: 4 [IU] via SUBCUTANEOUS
  Administered 2017-11-05: 11 [IU] via SUBCUTANEOUS

## 2017-11-04 MED ORDER — INSULIN ASPART 100 UNIT/ML ~~LOC~~ SOLN
0.0000 [IU] | Freq: Every day | SUBCUTANEOUS | Status: DC
  Administered 2017-11-04: 3 [IU] via SUBCUTANEOUS

## 2017-11-04 MED ORDER — POVIDONE-IODINE 7.5 % EX SOLN: Freq: Once | CUTANEOUS | Status: DC

## 2017-11-04 MED ORDER — GABAPENTIN 300 MG PO CAPS
300.0000 mg | ORAL_CAPSULE | Freq: Every day | ORAL | Status: DC
  Administered 2017-11-04: 300 mg via ORAL
  Filled 2017-11-04: qty 1

## 2017-11-04 MED ORDER — BUPIVACAINE-EPINEPHRINE 0.5% -1:200000 IJ SOLN
INTRAMUSCULAR | Status: DC | PRN
  Administered 2017-11-04: 50 mL

## 2017-11-04 MED ORDER — PROPOFOL 10 MG/ML IV BOLUS
INTRAVENOUS | Status: AC
  Filled 2017-11-04: qty 20

## 2017-11-04 MED ORDER — PHENOL 1.4 % MT LIQD: 1.0000 | OROMUCOSAL | Status: DC | PRN

## 2017-11-04 MED ORDER — ALUM & MAG HYDROXIDE-SIMETH 200-200-20 MG/5ML PO SUSP: 30.0000 mL | ORAL | Status: DC | PRN

## 2017-11-04 MED ORDER — HYDROMORPHONE HCL 1 MG/ML IJ SOLN: 0.5000 mg | INTRAMUSCULAR | Status: DC | PRN

## 2017-11-04 MED ORDER — ROPIVACAINE HCL 7.5 MG/ML IJ SOLN
INTRAMUSCULAR | Status: DC | PRN
  Administered 2017-11-04: 20 mL via PERINEURAL

## 2017-11-04 MED ORDER — METOCLOPRAMIDE HCL 5 MG PO TABS: 5.0000 mg | ORAL_TABLET | Freq: Three times a day (TID) | ORAL | Status: DC | PRN

## 2017-11-04 MED ORDER — FENTANYL CITRATE (PF) 100 MCG/2ML IJ SOLN
INTRAMUSCULAR | Status: DC | PRN
  Administered 2017-11-04: 25 ug via INTRAVENOUS
  Administered 2017-11-04: 50 ug via INTRAVENOUS
  Administered 2017-11-04: 25 ug via INTRAVENOUS

## 2017-11-04 MED ORDER — FENTANYL CITRATE (PF) 100 MCG/2ML IJ SOLN
INTRAMUSCULAR | Status: AC
  Administered 2017-11-04: 50 ug via INTRAVENOUS
  Filled 2017-11-04: qty 2

## 2017-11-04 MED ORDER — FENTANYL CITRATE (PF) 100 MCG/2ML IJ SOLN
50.0000 ug | Freq: Once | INTRAMUSCULAR | Status: AC
  Administered 2017-11-04: 50 ug via INTRAVENOUS

## 2017-11-04 MED ORDER — POLYETHYLENE GLYCOL 3350 17 G PO PACK
17.0000 g | PACK | Freq: Two times a day (BID) | ORAL | Status: DC
  Administered 2017-11-04 – 2017-11-05 (×2): 17 g via ORAL
  Filled 2017-11-04 (×2): qty 1

## 2017-11-04 MED ORDER — LACTATED RINGERS IV SOLN
INTRAVENOUS | Status: DC | PRN
  Administered 2017-11-04 (×2): via INTRAVENOUS

## 2017-11-04 MED ORDER — MENTHOL 3 MG MT LOZG: 1.0000 | LOZENGE | OROMUCOSAL | Status: DC | PRN

## 2017-11-04 MED ORDER — DEXAMETHASONE SODIUM PHOSPHATE 10 MG/ML IJ SOLN
10.0000 mg | Freq: Three times a day (TID) | INTRAMUSCULAR | Status: DC
  Administered 2017-11-04 – 2017-11-05 (×3): 10 mg via INTRAVENOUS
  Filled 2017-11-04 (×3): qty 1

## 2017-11-04 MED ORDER — BUPIVACAINE IN DEXTROSE 0.75-8.25 % IT SOLN
INTRATHECAL | Status: DC | PRN
  Administered 2017-11-04: 1.6 mL via INTRATHECAL

## 2017-11-04 MED ORDER — ONDANSETRON HCL 4 MG/2ML IJ SOLN: 4.0000 mg | Freq: Once | INTRAMUSCULAR | Status: DC | PRN

## 2017-11-04 MED ORDER — CLOMIPHENE CITRATE 50 MG PO TABS
25.0000 mg | ORAL_TABLET | Freq: Every day | ORAL | Status: DC
  Administered 2017-11-04 – 2017-11-05 (×2): 25 mg via ORAL
  Filled 2017-11-04 (×2): qty 1

## 2017-11-04 MED ORDER — SODIUM CHLORIDE 0.9 % IV SOLN
INTRAVENOUS | Status: DC | PRN
  Administered 2017-11-04: 15 ug/min via INTRAVENOUS

## 2017-11-04 MED ORDER — SODIUM CHLORIDE 0.9 % IR SOLN
Status: DC | PRN
  Administered 2017-11-04: 3000 mL

## 2017-11-04 MED ORDER — DIPHENHYDRAMINE HCL 12.5 MG/5ML PO ELIX: 12.5000 mg | ORAL_SOLUTION | ORAL | Status: DC | PRN

## 2017-11-04 MED ORDER — COLESEVELAM HCL 625 MG PO TABS
1250.0000 mg | ORAL_TABLET | Freq: Every day | ORAL | Status: DC
  Administered 2017-11-04 – 2017-11-05 (×2): 1250 mg via ORAL
  Filled 2017-11-04 (×2): qty 2

## 2017-11-04 MED ORDER — MIRABEGRON ER 25 MG PO TB24
25.0000 mg | ORAL_TABLET | Freq: Every day | ORAL | Status: DC
  Administered 2017-11-05: 25 mg via ORAL
  Filled 2017-11-04: qty 1

## 2017-11-04 MED ORDER — LEVOTHYROXINE SODIUM 50 MCG PO TABS
50.0000 ug | ORAL_TABLET | Freq: Every day | ORAL | Status: DC
  Administered 2017-11-05: 50 ug via ORAL
  Filled 2017-11-04: qty 1

## 2017-11-04 MED ORDER — ONDANSETRON HCL 4 MG/2ML IJ SOLN
INTRAMUSCULAR | Status: AC
  Filled 2017-11-04: qty 2

## 2017-11-04 SURGICAL SUPPLY — 75 items
APL SKNCLS STERI-STRIP NONHPOA (GAUZE/BANDAGES/DRESSINGS)
ATTUNE PS FEM RT SZ 5 CEM KNEE (Femur) ×2 IMPLANT
ATTUNE PSRP INSR SZ5 8 KNEE (Insert) ×2 IMPLANT
BANDAGE ESMARK 6X9 LF (GAUZE/BANDAGES/DRESSINGS) ×1 IMPLANT
BASE TIBIAL ROT PLAT SZ 7 KNEE (Knees) ×1 IMPLANT
BENZOIN TINCTURE PRP APPL 2/3 (GAUZE/BANDAGES/DRESSINGS) IMPLANT
BLADE SAGITTAL 25.0X1.19X90 (BLADE) ×2 IMPLANT
BLADE SAW SGTL 13X75X1.27 (BLADE) ×2 IMPLANT
BLADE SURG 10 STRL SS (BLADE) ×4 IMPLANT
BNDG CMPR MED 15X6 ELC VLCR LF (GAUZE/BANDAGES/DRESSINGS) ×1
BNDG ELASTIC 6X15 VLCR STRL LF (GAUZE/BANDAGES/DRESSINGS) ×2 IMPLANT
BNDG ESMARK 6X9 LF (GAUZE/BANDAGES/DRESSINGS) ×2
BOWL SMART MIX CTS (DISPOSABLE) ×2 IMPLANT
BSPLAT TIB 7 CMNT ROT PLAT STR (Knees) ×1 IMPLANT
CEMENT HV SMART SET (Cement) ×4 IMPLANT
COVER SURGICAL LIGHT HANDLE (MISCELLANEOUS) ×2 IMPLANT
COVER WAND RF STERILE (DRAPES) ×2 IMPLANT
CUFF TOURNIQUET SINGLE 34IN LL (TOURNIQUET CUFF) ×2 IMPLANT
CUFF TOURNIQUET SINGLE 44IN (TOURNIQUET CUFF) IMPLANT
DECANTER SPIKE VIAL GLASS SM (MISCELLANEOUS) ×2 IMPLANT
DRAPE EXTREMITY T 121X128X90 (DRAPE) ×2 IMPLANT
DRAPE HALF SHEET 40X57 (DRAPES) ×4 IMPLANT
DRAPE INCISE IOBAN 66X45 STRL (DRAPES) IMPLANT
DRAPE ORTHO SPLIT 77X108 STRL (DRAPES) ×1
DRAPE SURG ORHT 6 SPLT 77X108 (DRAPES) ×1 IMPLANT
DRAPE U-SHAPE 47X51 STRL (DRAPES) ×2 IMPLANT
DRSG AQUACEL AG ADV 3.5X10 (GAUZE/BANDAGES/DRESSINGS) ×2 IMPLANT
DURAPREP 26ML APPLICATOR (WOUND CARE) ×4 IMPLANT
ELECT CAUTERY BLADE 6.4 (BLADE) ×2 IMPLANT
ELECT REM PT RETURN 9FT ADLT (ELECTROSURGICAL) ×2
ELECTRODE REM PT RTRN 9FT ADLT (ELECTROSURGICAL) ×1 IMPLANT
FACESHIELD WRAPAROUND (MASK) ×2 IMPLANT
GLOVE BIO SURGEON STRL SZ7 (GLOVE) ×2 IMPLANT
GLOVE BIOGEL PI IND STRL 7.0 (GLOVE) ×1 IMPLANT
GLOVE BIOGEL PI IND STRL 7.5 (GLOVE) ×1 IMPLANT
GLOVE BIOGEL PI INDICATOR 7.0 (GLOVE) ×1
GLOVE BIOGEL PI INDICATOR 7.5 (GLOVE) ×1
GLOVE SS BIOGEL STRL SZ 7.5 (GLOVE) ×1 IMPLANT
GLOVE SUPERSENSE BIOGEL SZ 7.5 (GLOVE) ×1
GOWN STRL REUS W/ TWL LRG LVL3 (GOWN DISPOSABLE) ×1 IMPLANT
GOWN STRL REUS W/ TWL XL LVL3 (GOWN DISPOSABLE) ×1 IMPLANT
GOWN STRL REUS W/TWL LRG LVL3 (GOWN DISPOSABLE) ×2
GOWN STRL REUS W/TWL XL LVL3 (GOWN DISPOSABLE) ×1
HANDPIECE INTERPULSE COAX TIP (DISPOSABLE) ×2
HOOD PEEL AWAY FACE SHEILD DIS (HOOD) ×4 IMPLANT
IMMOBILIZER KNEE 22 UNIV (SOFTGOODS) ×2 IMPLANT
KIT BASIN OR (CUSTOM PROCEDURE TRAY) ×2 IMPLANT
KIT TURNOVER KIT B (KITS) ×2 IMPLANT
MANIFOLD NEPTUNE II (INSTRUMENTS) ×2 IMPLANT
MARKER SKIN DUAL TIP RULER LAB (MISCELLANEOUS) ×2 IMPLANT
NEEDLE HYPO 22GX1.5 SAFETY (NEEDLE) ×4 IMPLANT
NS IRRIG 1000ML POUR BTL (IV SOLUTION) ×2 IMPLANT
PACK TOTAL JOINT (CUSTOM PROCEDURE TRAY) ×2 IMPLANT
PAD ARMBOARD 7.5X6 YLW CONV (MISCELLANEOUS) ×4 IMPLANT
PATELLA MEDIAL ATTUN 35MM KNEE (Knees) ×2 IMPLANT
PIN STEINMAN FIXATION KNEE (PIN) ×2 IMPLANT
PIN THREADED HEADED SIGMA (PIN) ×2 IMPLANT
SET HNDPC FAN SPRY TIP SCT (DISPOSABLE) ×1 IMPLANT
STRIP CLOSURE SKIN 1/2X4 (GAUZE/BANDAGES/DRESSINGS) ×2 IMPLANT
SUCTION FRAZIER HANDLE 10FR (MISCELLANEOUS) ×1
SUCTION TUBE FRAZIER 10FR DISP (MISCELLANEOUS) ×1 IMPLANT
SUT MNCRL AB 3-0 PS2 18 (SUTURE) ×2 IMPLANT
SUT VIC AB 0 CT1 27 (SUTURE) ×4
SUT VIC AB 0 CT1 27XBRD ANBCTR (SUTURE) ×2 IMPLANT
SUT VIC AB 1 CT1 27 (SUTURE) ×2
SUT VIC AB 1 CT1 27XBRD ANBCTR (SUTURE) ×1 IMPLANT
SUT VIC AB 2-0 CT1 27 (SUTURE) ×2
SUT VIC AB 2-0 CT1 TAPERPNT 27 (SUTURE) ×2 IMPLANT
SYR CONTROL 10ML LL (SYRINGE) ×4 IMPLANT
TIBIAL BASE ROT PLAT SZ 7 KNEE (Knees) ×2 IMPLANT
TOWEL OR 17X24 6PK STRL BLUE (TOWEL DISPOSABLE) ×2 IMPLANT
TOWEL OR 17X26 10 PK STRL BLUE (TOWEL DISPOSABLE) ×2 IMPLANT
TRAY CATH 16FR W/PLASTIC CATH (SET/KITS/TRAYS/PACK) IMPLANT
TRAY FOLEY CATH SILVER 16FR (SET/KITS/TRAYS/PACK) ×2 IMPLANT
WATER STERILE IRR 1000ML POUR (IV SOLUTION) ×2 IMPLANT

## 2017-11-04 NOTE — Interval H&P Note (Signed)
History and Physical Interval Note:  11/04/2017 8:58 AM  David Maynard  has presented today for surgery, with the diagnosis of djd right knee  The various methods of treatment have been discussed with the patient and family. After consideration of risks, benefits and other options for treatment, the patient has consented to  Procedure(s): TOTAL KNEE ARTHROPLASTY (Right) as a surgical intervention .  The patient's history has been reviewed, patient examined, no change in status, stable for surgery.  I have reviewed the patient's chart and labs.  Questions were answered to the patient's satisfaction.     Nilda Simmer

## 2017-11-04 NOTE — Anesthesia Procedure Notes (Signed)
Anesthesia Regional Block: Adductor canal block   Pre-Anesthetic Checklist: ,, timeout performed, Correct Patient, Correct Site, Correct Laterality, Correct Procedure, Correct Position, site marked, Risks and benefits discussed,  Surgical consent,  Pre-op evaluation,  At surgeon's request and post-op pain management  Laterality: Right  Prep: chloraprep       Needles:  Injection technique: Single-shot  Needle Type: Echogenic Needle     Needle Length: 10cm  Needle Gauge: 21     Additional Needles:   Narrative:  Start time: 11/04/2017 9:17 AM End time: 11/04/2017 9:20 AM Injection made incrementally with aspirations every 5 mL.  Performed by: Personally  Anesthesiologist: Beryle Lathe, MD  Additional Notes: No pain on injection. No increased resistance to injection. Injection made in 5cc increments. Good needle visualization. Patient tolerated the procedure well.

## 2017-11-04 NOTE — Anesthesia Preprocedure Evaluation (Addendum)
Anesthesia Evaluation  Patient identified by MRN, date of birth, ID band Patient awake    Reviewed: Allergy & Precautions, NPO status , Patient's Chart, lab work & pertinent test results  History of Anesthesia Complications Negative for: history of anesthetic complications  Airway Mallampati: I  TM Distance: >3 FB Neck ROM: Full    Dental  (+) Dental Advisory Given, Teeth Intact   Pulmonary neg pulmonary ROS,    breath sounds clear to auscultation       Cardiovascular (-) anginanegative cardio ROS   Rhythm:Regular Rate:Normal   '16 TTE - Normal EF, trivial MR    Neuro/Psych PSYCHIATRIC DISORDERS Depression negative neurological ROS     GI/Hepatic negative GI ROS, Neg liver ROS,   Endo/Other  diabetes, Type 2, Oral Hypoglycemic AgentsHypothyroidism   Renal/GU negative Renal ROS  negative genitourinary   Musculoskeletal  (+) Arthritis , Osteoarthritis,    Abdominal   Peds  Hematology  (+) Blood dyscrasia, anemia ,   Anesthesia Other Findings   Reproductive/Obstetrics                           Anesthesia Physical Anesthesia Plan  ASA: II  Anesthesia Plan: Spinal   Post-op Pain Management:  Regional for Post-op pain   Induction:   PONV Risk Score and Plan: 1 and Treatment may vary due to age or medical condition and Propofol infusion  Airway Management Planned: Natural Airway and Simple Face Mask  Additional Equipment: None  Intra-op Plan:   Post-operative Plan:   Informed Consent: I have reviewed the patients History and Physical, chart, labs and discussed the procedure including the risks, benefits and alternatives for the proposed anesthesia with the patient or authorized representative who has indicated his/her understanding and acceptance.   Dental Advisory Given  Plan Discussed with: CRNA and Anesthesiologist  Anesthesia Plan Comments: (Labs reviewed, platelets  acceptable. Discussed risks and benefits of spinal, including spinal/epidural hematoma, infection, failed block, and PDPH. Patient expressed understanding and wished to proceed. )      Anesthesia Quick Evaluation

## 2017-11-04 NOTE — Anesthesia Postprocedure Evaluation (Signed)
Anesthesia Post Note  Patient: David Maynard  Procedure(s) Performed: TOTAL KNEE ARTHROPLASTY (Right )     Patient location during evaluation: PACU Anesthesia Type: Spinal Level of consciousness: awake and alert Pain management: pain level controlled Vital Signs Assessment: post-procedure vital signs reviewed and stable Respiratory status: spontaneous breathing and respiratory function stable Cardiovascular status: blood pressure returned to baseline and stable Postop Assessment: spinal receding and no apparent nausea or vomiting Anesthetic complications: no    Last Vitals:  Vitals:   11/04/17 1548 11/04/17 1947  BP: 117/60 126/63  Pulse: 73 99  Resp: 18 18  Temp: 36.6 C 36.8 C  SpO2: 98% 97%    Last Pain:  Vitals:   11/04/17 2117  TempSrc:   PainSc: 6                  Beryle Lathe

## 2017-11-04 NOTE — Anesthesia Procedure Notes (Signed)
Spinal  Patient location during procedure: OR Start time: 11/04/2017 10:15 AM End time: 11/04/2017 10:19 AM Staffing Anesthesiologist: Beryle Lathe, MD Performed: anesthesiologist  Preanesthetic Checklist Completed: patient identified, surgical consent, pre-op evaluation, timeout performed, IV checked, risks and benefits discussed and monitors and equipment checked Spinal Block Patient position: sitting Prep: DuraPrep Patient monitoring: heart rate, cardiac monitor, continuous pulse ox and blood pressure Approach: midline Location: L4-5 Injection technique: single-shot Needle Needle type: Pencan  Needle gauge: 24 G Additional Notes Consent was obtained prior to the procedure with all questions answered and concerns addressed. Risks including, but not limited to, bleeding, infection, nerve damage, paralysis, failed block, inadequate analgesia, allergic reaction, high spinal, itching, and headache were discussed and the patient wished to proceed. Functioning IV was confirmed and monitors were applied. Sterile prep and drape, including hand hygiene, mask, and sterile gloves were used. The patient was positioned and the spine was prepped. The skin was anesthetized with lidocaine. Free flow of clear CSF was obtained prior to injecting local anesthetic into the CSF. The spinal needle aspirated freely following injection. The needle was carefully withdrawn. The patient tolerated the procedure well.   Leslye Peer, MD

## 2017-11-04 NOTE — Evaluation (Signed)
Physical Therapy Evaluation Patient Details Name: David Maynard MRN: [id number] DOB: [birth year] Today's Date: 11/04/2017   History of Present Illness  Pt is a 67 y/o male s/p elective R TKA. PMH includes anxiety, DM, HTN, and depression.   Clinical Impression  Pt is s/p surgery above with deficits below. Pt tolerated gait training very well and required min to min guard A for mobility using RW. Educated about knee precautions and supine HEP. Will continue to follow acutely to maximize functional mobility independence and safety.     Follow Up Recommendations Follow surgeon's recommendation for DC plan and follow-up therapies;Supervision for mobility/OOB    Equipment Recommendations  None recommended by PT    Recommendations for Other Services       Precautions / Restrictions Precautions Precautions: Knee Precaution Booklet Issued: Yes (comment) Precaution Comments: Reviewed knee precautions and supine HEP with pt.  Required Braces or Orthoses: Knee Immobilizer - Right Knee Immobilizer - Right: Other (comment)(until good quad control ) Restrictions Weight Bearing Restrictions: Yes RLE Weight Bearing: Weight bearing as tolerated      Mobility  Bed Mobility Overal bed mobility: Needs Assistance Bed Mobility: Supine to Sit     Supine to sit: Supervision     General bed mobility comments: Supervision for safety. Increased time to come to EOB.   Transfers Overall transfer level: Needs assistance Equipment used: Rolling walker (2 wheeled) Transfers: Sit to/from Stand Sit to Stand: Min assist         General transfer comment: Min A for steadying assist to stand. Demonstrated good hand placement.   Ambulation/Gait Ambulation/Gait assistance: Min guard Gait Distance (Feet): 100 Feet Assistive device: Rolling walker (2 wheeled) Gait Pattern/deviations: Step-to pattern;Step-through pattern;Decreased step length - right;Decreased step length - left;Decreased weight shift  to right;Ataxic Gait velocity: Decreased    General Gait Details: Slow, mildly antalgic gait. Able to progress to step through gait pattern, however, remained guarded during stance phase on RLE. Min guard for safety using RW.   Stairs            Wheelchair Mobility    Modified Rankin (Stroke Patients Only)       Balance Overall balance assessment: Needs assistance Sitting-balance support: No upper extremity supported;Feet supported Sitting balance-Leahy Scale: Good     Standing balance support: Bilateral upper extremity supported;During functional activity Standing balance-Leahy Scale: Poor Standing balance comment: Reliant on BUE support                              Pertinent Vitals/Pain Pain Assessment: 0-10 Pain Score: 2  Pain Location: R knee  Pain Descriptors / Indicators: Aching;Operative site guarding Pain Intervention(s): Limited activity within patient's tolerance;Monitored during session;Repositioned    Home Living Family/patient expects to be discharged to:: Private residence Living Arrangements: Spouse/significant other Available Help at Discharge: Family Type of Home: House Home Access: Stairs to enter Entrance Stairs-Rails: Right;Left;Can reach both Entrance Stairs-Number of Steps: 3 Home Layout: Two level;Able to live on main level with bedroom/bathroom Home Equipment: Dan Humphreys - 2 wheels;Cane - single point;Bedside commode;Shower seat      Prior Function Level of Independence: Independent         Comments: Was very active and liked to play tennis      Hand Dominance        Extremity/Trunk Assessment   Upper Extremity Assessment Upper Extremity Assessment: Overall WFL for tasks assessed    Lower Extremity Assessment Lower Extremity  Assessment: RLE deficits/detail RLE Deficits / Details: Reports some decreased sensation. Able to perform ther ex below. Deficits consistent with post op pain and weakness.     Cervical /  Trunk Assessment Cervical / Trunk Assessment: Normal  Communication   Communication: No difficulties  Cognition Arousal/Alertness: Awake/alert Behavior During Therapy: WFL for tasks assessed/performed Overall Cognitive Status: Within Functional Limits for tasks assessed                                        General Comments General comments (skin integrity, edema, etc.): Pt's wife present throughout session.     Exercises Total Joint Exercises Ankle Circles/Pumps: AROM;Both;20 reps Quad Sets: AROM;Right;10 reps Heel Slides: AROM;Right;10 reps   Assessment/Plan    PT Assessment Patient needs continued PT services  PT Problem List Decreased strength;Decreased balance;Decreased mobility;Decreased knowledge of use of DME;Decreased knowledge of precautions;Pain       PT Treatment Interventions DME instruction;Gait training;Stair training;Functional mobility training;Therapeutic activities;Therapeutic exercise;Patient/family education    PT Goals (Current goals can be found in the Care Plan section)  Acute Rehab PT Goals Patient Stated Goal: to get back to playing tennis  PT Goal Formulation: With patient Time For Goal Achievement: 11/18/17 Potential to Achieve Goals: Good    Frequency 7X/week   Barriers to discharge        Co-evaluation               AM-PAC PT "6 Clicks" Daily Activity  Outcome Measure Difficulty turning over in bed (including adjusting bedclothes, sheets and blankets)?: A Little Difficulty moving from lying on back to sitting on the side of the bed? : A Little Difficulty sitting down on and standing up from a chair with arms (e.g., wheelchair, bedside commode, etc,.)?: Unable Help needed moving to and from a bed to chair (including a wheelchair)?: A Little Help needed walking in hospital room?: A Little Help needed climbing 3-5 steps with a railing? : A Little 6 Click Score: 16    End of Session Equipment Utilized During  Treatment: Gait belt;Right knee immobilizer Activity Tolerance: Patient tolerated treatment well Patient left: in chair;with call bell/phone within reach;with family/visitor present Nurse Communication: Mobility status PT Visit Diagnosis: Other abnormalities of gait and mobility (R26.89);Pain Pain - Right/Left: Right Pain - part of body: Knee    Time: 6962-9528 PT Time Calculation (min) (ACUTE ONLY): 30 min   Charges:   PT Evaluation $PT Eval Low Complexity: 1 Low PT Treatments $Gait Training: 8-22 mins        Gladys Damme, PT, DPT  Acute Rehabilitation Services  Pager: [phone] Office: [phone]   Lehman Prom 11/04/2017, 5:23 PM

## 2017-11-04 NOTE — Progress Notes (Signed)
Orthopedic Tech Progress Note Patient Details:  David Maynard 1950-10-07 [id number]  CPM Right Knee CPM Right Knee: On Right Knee Flexion (Degrees): 90 Right Knee Extension (Degrees): 0 Additional Comments: foot roll  Post Interventions Patient Tolerated: Well Instructions Provided: Care of device  Saul Fordyce 11/04/2017, 2:00 PM

## 2017-11-04 NOTE — Op Note (Signed)
MRN:     [id number] DOB/AGE:    1951-01-14 / 67 y.o.       OPERATIVE REPORT   DATE OF PROCEDURE:  11/04/2017      PREOPERATIVE DIAGNOSIS:   Primary Localized Osteoarthritis right Knee       Estimated body mass index is 20.08 kg/m as calculated from the following:   Height as of this encounter: 5\' 8"  (1.727 m).   Weight as of this encounter: 59.9 kg.                                                       POSTOPERATIVE DIAGNOSIS:   Same                                                                 PROCEDURE:  Procedure(s): TOTAL KNEE ARTHROPLASTY Using Depuy Attune RP implants #5 Femur, #7Tibia, 8mm  RP bearing, 35 Patella    SURGEON: Myliyah Rebuck A. Thurston Hole, MD   ASSISTANT: Julien Girt, PA-C, present and scrubbed throughout the case, critical for retraction, instrumentation, and closure.  ANESTHESIA: Spinal with Adductor Nerve Block  TOURNIQUET TIME: 70 minutes   COMPLICATIONS:  None       SPECIMENS: None   INDICATIONS FOR PROCEDURE: The patient has djd of the knee with varus deformities, XR shows bone on bone arthritis. Patient has failed all conservative measures including anti-inflammatory medicines, narcotics, attempts at exercise and weight loss, cortisone injections and viscosupplementation.  Risks and benefits of surgery have been discussed, questions answered.    DESCRIPTION OF PROCEDURE: The patient identified by armband, received right adductor canal block and IV antibiotics, in the holding area at [facility]. Patient taken to the operating room, appropriate anesthetic monitors were attached. Spinal anesthesia induced with the patient in supine position, Foley catheter was inserted. Tourniquet applied high to the operative thigh. Lateral post and foot positioner applied to the table, the lower extremity was then prepped and draped in usual sterile fashion from the ankle to the tourniquet. Time-out procedure was performed. The limb was wrapped with an Esmarch  bandage and the tourniquet inflated to 365 mmHg.   We began the operation by making a 6cm anterior midline incision. Small bleeders in the skin and the subcutaneous tissue identified and cauterized. Transverse retinaculum was incised and reflected medially and a medial parapatellar arthrotomy was accomplished. the patella was everted and theprepatellar fat pad resected. The superficial medial collateral ligament was then elevated from anterior to posterior along the proximal flare of the tibia and anterior half of the menisci resected. The knee was hyperflexed exposing bone on bone arthritis. Peripheral and notch osteophytes as well as the cruciate ligaments were then resected. We continued to work our way around posteriorly along the proximal tibia, and externally rotated the tibia subluxing it out from underneath the femur. A McHale retractor was placed through the notch and a lateral Hohmann retractor placed, and an external tibial guide was placed.  The tibial cutting guide was pinned into place allowing resection of 4 mm of bone medially and about 6 mm of bone laterally because of her  varus deformity.   Satisfied with the tibial resection, we then entered the distal femur 2 mm anterior to the PCL origin with the intramedullary guide rod and applied the distal femoral cutting guide set at 11mm, with 5 degrees of valgus. This was pinned along the epicondylar axis. At this point, the distal femoral cut was accomplished without difficulty. We then sized for a 5 femoral component and pinned the guide in 3 degrees of external rotation.The chamfer cutting guide was pinned into place. The anterior, posterior, and chamfer cuts were accomplished without difficulty followed by the  RP box cutting guide and the box cut. We also removed posterior osteophytes from the posterior femoral condyles. At this time, the knee was brought into full extension. We checked our extension and flexion gaps and found them symmetric at  8.  The patella thickness measured at 62m m. We set the cutting guide at 15 and removed the posterior patella sized for 35 button and drilled the lollipop. The knee was then once again hyperflexed exposing the proximal tibia. We sized for a # 7 tibial base plate, applied the smokestack and the conical reamer followed by the the Delta fin keel punch. We then hammered into place the  RP trial femoral component, inserted a trial bearing, trial patellar button, and took the knee through range of motion from 0-130 degrees. No thumb pressure was required for patellar tracking.   At this point, all trial components were removed, a double batch of DePuy HV cement  with Zinecef 1.5 gms was mixed and applied to all bony metallic mating surfaces. In order, we hammered into place the tibial tray and removed excess cement, the femoral component and removed excess cement, a 8 mm  RP bearing was inserted, and the knee brought to full extension with compression. The patellar button was clamped into place, and excess cement removed. While the cement cured the wound was irrigated out with normal saline solution pulse lavage, and exparel was injected throughout the knee. Ligament stability and patellar tracking were checked and found to be excellent..   The parapatellar arthrotomy was closed with  #1 Vicryl suture. The subcutaneous tissue with 0 and 2-0 undyed Vicryl suture, and 4-0 Monocryl.. A dressing of Aquaseal, 4 x 4, dressing sponges, Webril, and Ace wrap applied. Needle and sponge count were correct times 2.The patient awakened, extubated, and taken to recovery room without difficulty. Vascular status was normal, pulses 2+ and symmetric.    Nilda Simmer 04/15/2017, 8:56 AM

## 2017-11-04 NOTE — Transfer of Care (Signed)
Immediate Anesthesia Transfer of Care Note  Patient: David Maynard  Procedure(s) Performed: TOTAL KNEE ARTHROPLASTY (Right )  Patient Location: PACU  Anesthesia Type:MAC and Spinal  Level of Consciousness: awake, alert  and oriented  Airway & Oxygen Therapy: Patient Spontanous Breathing and Patient connected to nasal cannula oxygen  Post-op Assessment: Report given to RN and Post -op Vital signs reviewed and stable  Post vital signs: Reviewed and stable  Last Vitals:  Vitals Value Taken Time  BP 119/61 11/04/2017 12:34 PM  Temp    Pulse 80 11/04/2017 12:34 PM  Resp 16 11/04/2017 12:34 PM  SpO2 100 % 11/04/2017 12:34 PM  Vitals shown include unvalidated device data.  Last Pain:  Vitals:   11/04/17 0923  TempSrc:   PainSc: 0-No pain         Complications: No apparent anesthesia complications

## 2017-11-05 ENCOUNTER — Encounter (HOSPITAL_COMMUNITY): Payer: Self-pay | Admitting: Orthopedic Surgery

## 2017-11-05 DIAGNOSIS — E039 Hypothyroidism, unspecified: Secondary | ICD-10-CM | POA: Diagnosis present

## 2017-11-05 DIAGNOSIS — M25561 Pain in right knee: Secondary | ICD-10-CM | POA: Diagnosis present

## 2017-11-05 DIAGNOSIS — H9191 Unspecified hearing loss, right ear: Secondary | ICD-10-CM | POA: Diagnosis present

## 2017-11-05 DIAGNOSIS — M519 Unspecified thoracic, thoracolumbar and lumbosacral intervertebral disc disorder: Secondary | ICD-10-CM | POA: Diagnosis present

## 2017-11-05 DIAGNOSIS — E7800 Pure hypercholesterolemia, unspecified: Secondary | ICD-10-CM | POA: Diagnosis present

## 2017-11-05 DIAGNOSIS — M1711 Unilateral primary osteoarthritis, right knee: Secondary | ICD-10-CM | POA: Diagnosis present

## 2017-11-05 DIAGNOSIS — Z7984 Long term (current) use of oral hypoglycemic drugs: Secondary | ICD-10-CM | POA: Diagnosis not present

## 2017-11-05 DIAGNOSIS — E119 Type 2 diabetes mellitus without complications: Secondary | ICD-10-CM | POA: Diagnosis present

## 2017-11-05 DIAGNOSIS — Z79890 Hormone replacement therapy (postmenopausal): Secondary | ICD-10-CM | POA: Diagnosis not present

## 2017-11-05 DIAGNOSIS — Z79899 Other long term (current) drug therapy: Secondary | ICD-10-CM | POA: Diagnosis not present

## 2017-11-05 DIAGNOSIS — E291 Testicular hypofunction: Secondary | ICD-10-CM | POA: Diagnosis present

## 2017-11-05 DIAGNOSIS — M25761 Osteophyte, right knee: Secondary | ICD-10-CM | POA: Diagnosis present

## 2017-11-05 LAB — BASIC METABOLIC PANEL
ANION GAP: 8 (ref 5–15)
BUN: 19 mg/dL (ref 8–23)
CO2: 26 mmol/L (ref 22–32)
Calcium: 8.8 mg/dL — ABNORMAL LOW (ref 8.9–10.3)
Chloride: 103 mmol/L (ref 98–111)
Creatinine, Ser: 0.92 mg/dL (ref 0.61–1.24)
GFR calc Af Amer: >60 mL/min (ref >60)
Glucose, Bld: 186 mg/dL — ABNORMAL HIGH (ref 70–99)
POTASSIUM: 4.1 mmol/L (ref 3.5–5.1)
SODIUM: 137 mmol/L (ref 135–145)

## 2017-11-05 LAB — CBC
HCT: 34.2 % — ABNORMAL LOW (ref 39.0–52.0)
HEMOGLOBIN: 11.3 g/dL — AB (ref 13.0–17.0)
MCH: 30.1 pg (ref 26.0–34.0)
MCHC: 33 g/dL (ref 30.0–36.0)
MCV: 91.2 fL (ref 80.0–100.0)
NRBC: 0 % (ref 0.0–0.2)
PLATELETS: 204 10*3/uL (ref 150–400)
RBC: 3.75 MIL/uL — AB (ref 4.22–5.81)
RDW: 13.6 % (ref 11.5–15.5)
WBC: 17.5 10*3/uL — ABNORMAL HIGH (ref 4.0–10.5)

## 2017-11-05 LAB — URINE CULTURE: CULTURE: NO GROWTH

## 2017-11-05 LAB — GLUCOSE, CAPILLARY
GLUCOSE-CAPILLARY: 177 mg/dL — AB (ref 70–99)
Glucose-Capillary: 174 mg/dL — ABNORMAL HIGH (ref 70–99)
Glucose-Capillary: 166 mg/dL — ABNORMAL HIGH (ref 70–99)
Glucose-Capillary: 271 mg/dL — ABNORMAL HIGH (ref 70–99)

## 2017-11-05 MED ORDER — GABAPENTIN 300 MG PO CAPS: 300.0000 mg | ORAL_CAPSULE | Freq: Every day | ORAL | 0 refills | Status: DC

## 2017-11-05 MED ORDER — PROMETHAZINE HCL 12.5 MG PO TABS: 12.5000 mg | ORAL_TABLET | Freq: Four times a day (QID) | ORAL | 0 refills | Status: DC | PRN

## 2017-11-05 MED ORDER — OXYCODONE HCL 5 MG PO TABS: ORAL_TABLET | ORAL | 0 refills | Status: DC

## 2017-11-05 MED ORDER — DOCUSATE SODIUM 100 MG PO CAPS: ORAL_CAPSULE | ORAL | 0 refills | Status: DC

## 2017-11-05 MED ORDER — SODIUM CHLORIDE 0.9 % IV BOLUS
500.0000 mL | Freq: Once | INTRAVENOUS | Status: AC
  Administered 2017-11-05: 500 mL via INTRAVENOUS

## 2017-11-05 MED ORDER — ACETAMINOPHEN 500 MG PO TABS: 1000.0000 mg | ORAL_TABLET | Freq: Four times a day (QID) | ORAL | 0 refills | Status: DC

## 2017-11-05 MED ORDER — ASPIRIN 325 MG PO TBEC: 325.0000 mg | DELAYED_RELEASE_TABLET | Freq: Every day | ORAL | 0 refills | Status: DC

## 2017-11-05 MED ORDER — POLYETHYLENE GLYCOL 3350 17 G PO PACK: 17.0000 g | PACK | Freq: Two times a day (BID) | ORAL | 0 refills | Status: DC

## 2017-11-05 NOTE — Progress Notes (Signed)
Physical Therapy Treatment Patient Details Name: David Maynard MRN: [id number] DOB: May 28, 1950 Today's Date: 11/05/2017    History of Present Illness Pt is a 67 y/o male s/p elective R TKA. PMH includes anxiety, DM, HTN, and depression.     PT Comments    Patient is making progress toward PT goals and tolerated session well. Pt able to ambulate 255ft and negotiate steps with min guard assist.  HEP reviewed and positioning reviewed with pt and wife. Current plan remains appropriate.   Follow Up Recommendations  Follow surgeon's recommendation for DC plan and follow-up therapies;Supervision for mobility/OOB     Equipment Recommendations  None recommended by PT    Recommendations for Other Services       Precautions / Restrictions Precautions Precautions: Knee Precaution Comments: Reviewed knee precautions/positioning  Restrictions Weight Bearing Restrictions: Yes RLE Weight Bearing: Weight bearing as tolerated    Mobility  Bed Mobility               General bed mobility comments: pt OOB in chair upon arrival  Transfers Overall transfer level: Needs assistance Equipment used: Rolling walker (2 wheeled) Transfers: Sit to/from Stand Sit to Stand: Min guard         General transfer comment: cues for safe hand placement  Ambulation/Gait Ambulation/Gait assistance: Min guard Gait Distance (Feet): 200 Feet Assistive device: Rolling walker (2 wheeled) Gait Pattern/deviations: Step-through pattern;Decreased weight shift to right;Decreased dorsiflexion - right;Antalgic;Decreased step length - left Gait velocity: Decreased    General Gait Details: cues for posture, sequencing, and R heel strike/toe off   Stairs Stairs: Yes Stairs assistance: Min guard Stair Management: One rail Right;Step to pattern;Sideways Number of Stairs: 4 General stair comments: cues for sequencing and technique   Wheelchair Mobility    Modified Rankin (Stroke Patients Only)        Balance Overall balance assessment: Needs assistance Sitting-balance support: No upper extremity supported;Feet supported Sitting balance-Leahy Scale: Good     Standing balance support: Bilateral upper extremity supported;During functional activity Standing balance-Leahy Scale: Poor                              Cognition Arousal/Alertness: Awake/alert Behavior During Therapy: WFL for tasks assessed/performed Overall Cognitive Status: Within Functional Limits for tasks assessed                                        Exercises Total Joint Exercises Short Arc Quad: AROM;Right Hip ABduction/ADduction: AROM;Right Straight Leg Raises: AROM;Right Long Arc Quad: AROM;Right Knee Flexion: AROM;Right;10 reps;Seated(10 second holds)    General Comments        Pertinent Vitals/Pain Pain Assessment: Faces Faces Pain Scale: Hurts little more Pain Location: R knee  Pain Descriptors / Indicators: Guarding;Sore Pain Intervention(s): Monitored during session;Repositioned;Premedicated before session    Home Living                      Prior Function            PT Goals (current goals can now be found in the care plan section) Acute Rehab PT Goals Patient Stated Goal: to get back to playing tennis  Progress towards PT goals: Progressing toward goals    Frequency    7X/week      PT Plan Current plan remains appropriate    Co-evaluation  AM-PAC PT "6 Clicks" Daily Activity  Outcome Measure  Difficulty turning over in bed (including adjusting bedclothes, sheets and blankets)?: A Little Difficulty moving from lying on back to sitting on the side of the bed? : A Little Difficulty sitting down on and standing up from a chair with arms (e.g., wheelchair, bedside commode, etc,.)?: Unable Help needed moving to and from a bed to chair (including a wheelchair)?: A Little Help needed walking in hospital room?: A  Little Help needed climbing 3-5 steps with a railing? : A Little 6 Click Score: 16    End of Session Equipment Utilized During Treatment: Gait belt Activity Tolerance: Patient tolerated treatment well Patient left: in chair;with call bell/phone within reach;with family/visitor present Nurse Communication: Mobility status PT Visit Diagnosis: Other abnormalities of gait and mobility (R26.89);Pain Pain - Right/Left: Right Pain - part of body: Knee     Time: 1610-9604 PT Time Calculation (min) (ACUTE ONLY): 36 min  Charges:  $Gait Training: 8-22 mins $Therapeutic Exercise: 8-22 mins                     Erline Levine, PTA Acute Rehabilitation Services Pager: [phone] Office: [phone]     Carolynne Edouard 11/05/2017, 5:09 PM

## 2017-11-05 NOTE — Progress Notes (Signed)
Orthopedic Tech Progress Note Patient Details:  David Maynard 10-08-1950 [id number]  Patient ID: [name], male   DOB: 04-17-1950, 67 y.o.   MRN: [id number] I went to apply cpm. Pt was already in cpm. Pt stated had been in cpm all night. When asked if he wanted to remove the cpm he said no. He would leave it on till he was ready to get up.  [name] 11/05/2017, 5:54 AM

## 2017-11-05 NOTE — Progress Notes (Signed)
Pt given discharge instructions and gone over with him and wife present. All belongings gathered to be sent home.

## 2017-11-05 NOTE — Progress Notes (Signed)
Physical Therapy Treatment Patient Details Name: David Maynard MRN: [id number] DOB: Nov 17, 1950 Today's Date: 11/05/2017    History of Present Illness Pt is a 67 y/o male s/p elective R TKA. PMH includes anxiety, DM, HTN, and depression.     PT Comments    Patient is making progress toward PT goals. Pt limited by nausea this session however motivated to participate in therapy. Continue to progress as tolerated and plan for stair training this pm session.    Follow Up Recommendations  Follow surgeon's recommendation for DC plan and follow-up therapies;Supervision for mobility/OOB     Equipment Recommendations  None recommended by PT    Recommendations for Other Services       Precautions / Restrictions Precautions Precautions: Knee Precaution Comments: Reviewed knee precautions/positioning  Restrictions Weight Bearing Restrictions: Yes RLE Weight Bearing: Weight bearing as tolerated    Mobility  Bed Mobility Overal bed mobility: Modified Independent Bed Mobility: Supine to Sit           General bed mobility comments: increased time and effort  Transfers Overall transfer level: Needs assistance Equipment used: Rolling walker (2 wheeled) Transfers: Sit to/from Stand Sit to Stand: Min guard         General transfer comment: cues for safe hand placement and positioning prior to standing  Ambulation/Gait Ambulation/Gait assistance: Min guard Gait Distance (Feet): (113ft X2 with seated rest break) Assistive device: Rolling walker (2 wheeled) Gait Pattern/deviations: Step-to pattern;Step-through pattern;Decreased step length - left;Decreased weight shift to right;Decreased stance time - right;Decreased dorsiflexion - right;Antalgic Gait velocity: Decreased    General Gait Details: cues for posture, step length symmetry, R heel strike/toe off and increased R knee flexion durins swing phase; seated break required due to increasing nausea with mobility   Stairs              Wheelchair Mobility    Modified Rankin (Stroke Patients Only)       Balance Overall balance assessment: Needs assistance Sitting-balance support: No upper extremity supported;Feet supported Sitting balance-Leahy Scale: Good     Standing balance support: Bilateral upper extremity supported;During functional activity Standing balance-Leahy Scale: Poor Standing balance comment: Reliant on BUE support                             Cognition Arousal/Alertness: Awake/alert Behavior During Therapy: WFL for tasks assessed/performed Overall Cognitive Status: Within Functional Limits for tasks assessed                                        Exercises      General Comments        Pertinent Vitals/Pain Pain Assessment: Faces Faces Pain Scale: Hurts even more Pain Location: R knee  Pain Descriptors / Indicators: Grimacing;Guarding;Sore Pain Intervention(s): Limited activity within patient's tolerance;Monitored during session;Premedicated before session;Repositioned    Home Living                      Prior Function            PT Goals (current goals can now be found in the care plan section) Acute Rehab PT Goals Patient Stated Goal: to get back to playing tennis  Progress towards PT goals: Progressing toward goals    Frequency    7X/week      PT Plan Current plan remains appropriate  Co-evaluation              AM-PAC PT "6 Clicks" Daily Activity  Outcome Measure  Difficulty turning over in bed (including adjusting bedclothes, sheets and blankets)?: A Little Difficulty moving from lying on back to sitting on the side of the bed? : A Little Difficulty sitting down on and standing up from a chair with arms (e.g., wheelchair, bedside commode, etc,.)?: Unable Help needed moving to and from a bed to chair (including a wheelchair)?: A Little Help needed walking in hospital room?: A Little Help needed  climbing 3-5 steps with a railing? : A Little 6 Click Score: 16    End of Session Equipment Utilized During Treatment: Gait belt Activity Tolerance: Other (comment);Patient limited by pain(nauseated) Patient left: in chair;with call bell/phone within reach;with family/visitor present Nurse Communication: Mobility status PT Visit Diagnosis: Other abnormalities of gait and mobility (R26.89);Pain Pain - Right/Left: Right Pain - part of body: Knee     Time: 1610-9604 PT Time Calculation (min) (ACUTE ONLY): 30 min  Charges:  $Gait Training: 8-22 mins $Therapeutic Activity: 8-22 mins                     Erline Levine, PTA Acute Rehabilitation Services Pager: [phone] Office: [phone]     Carolynne Edouard 11/05/2017, 10:51 AM

## 2017-11-05 NOTE — Care Management Obs Status (Signed)
MEDICARE OBSERVATION STATUS NOTIFICATION   Patient Details  Name: David Maynard MRN: [id number] Date of Birth: 1950/09/10   Medicare Observation Status Notification Given:  Yes    [name], RN 11/05/2017, 1:13 PM

## 2017-11-05 NOTE — Plan of Care (Signed)
  Problem: Clinical Measurements: Goal: Will remain free from infection Outcome: Progressing Note:  Pt receiving IV antibiotics.    Problem: Nutrition: Goal: Adequate nutrition will be maintained Outcome: Progressing   Problem: Elimination: Goal: Will not experience complications related to urinary retention Outcome: Progressing Note:  Foley in place.    Problem: Safety: Goal: Ability to remain free from injury will improve Outcome: Progressing Note:  Pt remains free of falls. Bed locked and in lowest position. Call bell within reach. Will continue to monitor.    Problem: Activity: Goal: Ability to avoid complications of mobility impairment will improve Outcome: Progressing   Problem: Pain Management: Goal: Pain level will decrease with appropriate interventions Outcome: Progressing

## 2017-11-06 NOTE — Discharge Summary (Signed)
Patient ID: David Maynard MRN: [id number] DOB/AGE: 1950/11/04 67 y.o.  Admit date: 11/04/2017 Discharge date: 11/05/2017  Admission Diagnoses:  Principal Problem:   Primary localized osteoarthritis of right knee Active Problems:   Hypothyroidism   HYPERCHOLESTEROLEMIA   Diabetes mellitus, type II ([place])   Lumbar disc disease   Discharge Diagnoses:  Same  Past Medical History:  Diagnosis Date  . Depressive disorder, not elsewhere classified    none  . Diabetes mellitus type II   . Hypercholesterolemia   . Hypogonadism male   . Primary localized osteoarthritis of right knee 10/23/2017  . Thyroid disease   . Unspecified hypothyroidism     Surgeries: Procedure(s): TOTAL KNEE ARTHROPLASTY on 11/04/2017   Consultants:   Discharged Condition: Improved  Hospital Course: David Maynard is an 67 y.o. male who was admitted 11/04/2017 for operative treatment ofPrimary localized osteoarthritis of right knee. Patient has severe unremitting pain that affects sleep, daily activities, and work/hobbies. After pre-op clearance the patient was taken to the operating room on 11/04/2017 and underwent  Procedure(s): TOTAL KNEE ARTHROPLASTY.    Patient was given perioperative antibiotics:  Anti-infectives (From admission, onward)   Start     Dose/Rate Route Frequency Ordered Stop   11/04/17 1600  ceFAZolin (ANCEF) IVPB 2g/100 mL premix     2 g 200 mL/hr over 30 Minutes Intravenous Every 6 hours 11/04/17 1541 11/04/17 2147   11/04/17 1139  cefUROXime (ZINACEF) injection  Status:  Discontinued       As needed 11/04/17 1140 11/04/17 1230   11/04/17 0815  ceFAZolin (ANCEF) IVPB 2g/100 mL premix     2 g 200 mL/hr over 30 Minutes Intravenous On call to O.R. 11/04/17 0802 11/04/17 1018       Patient was given sequential compression devices, early ambulation, and chemoprophylaxis to prevent DVT.  Patient benefited maximally from hospital stay and there were no complications.    Recent vital  signs: No data found.   Recent laboratory studies:  Recent Labs    11/05/17 0140  WBC 17.5*  HGB 11.3*  HCT 34.2*  PLT 204  NA 137  K 4.1  CL 103  CO2 26  BUN 19  CREATININE 0.92  GLUCOSE 186*  CALCIUM 8.8*     Discharge Medications:   Allergies as of 11/05/2017   No Known Allergies     Medication List    STOP taking these medications   glucosamine-chondroitin 500-400 MG tablet   glucose blood test strip   multivitamin capsule   omega-3 acid ethyl esters 1 g capsule Commonly known as:  LOVAZA   TURMERIC PO   vitamin C 500 MG tablet Commonly known as:  ASCORBIC ACID   vitamin D (CHOLECALCIFEROL) 400 units tablet   vitamin E 400 UNIT capsule     TAKE these medications   ACCU-CHEK NANO SMARTVIEW w/Device Kit Use to check blood sugar 6 times per day.   acetaminophen 500 MG tablet Commonly known as:  TYLENOL Take 2 tablets (1,000 mg total) by mouth every 6 (six) hours.   aspirin 325 MG EC tablet Take 1 tablet (325 mg total) by mouth daily with breakfast.   bromocriptine 2.5 MG tablet Commonly known as:  PARLODEL Take 1 tablet (2.5 mg total) by mouth daily.   clomiPHENE 50 MG tablet Commonly known as:  CLOMID TAKE ONE-HALF TABLET BY  MOUTH DAILY   colesevelam 625 MG tablet Commonly known as:  WELCHOL TAKE 2 TABLETS BY MOUTH  DAILY   docusate  sodium 100 MG capsule Commonly known as:  COLACE 1 tab 2 times a day while on narcotics.  STOOL SOFTENER   EASY TOUCH PEN NEEDLES 32G X 6 MM Misc Generic drug:  Insulin Pen Needle USE ONCE DAILY IN THE  MORNING What changed:  Another medication with the same name was removed. Continue taking this medication, and follow the directions you see here.   gabapentin 300 MG capsule Commonly known as:  NEURONTIN Take 1 capsule (300 mg total) by mouth at bedtime.   levothyroxine 50 MCG tablet Commonly known as:  SYNTHROID, LEVOTHROID TAKE 1 TABLET BY MOUTH  DAILY BEFORE BREAKFAST What changed:    how much  to take  how to take this  when to take this   liraglutide 18 MG/3ML Sopn Commonly known as:  [place] 1.8MG EVERY MORNING What changed:    how much to take  how to take this  when to take this   metFORMIN 500 MG 24 hr tablet Commonly known as:  GLUCOPHAGE-XR TAKE 2 TABLETS BY MOUTH TWO TIMES DAILY What changed:    how much to take  how to take this  when to take this   MYRBETRIQ 25 MG Tb24 tablet Generic drug:  mirabegron ER Take 25 mg by mouth daily.   nateglinide 60 MG tablet Commonly known as:  STARLIX Take 1 tablet (60 mg total) by mouth 3 (three) times daily with meals.   oxyCODONE 5 MG immediate release tablet Commonly known as:  Oxy IR/ROXICODONE 1 po q 4 hrs prn pain   pioglitazone 45 MG tablet Commonly known as:  ACTOS Take 1 tablet (45 mg total) by mouth daily.   polyethylene glycol packet Commonly known as:  MIRALAX / GLYCOLAX Take 17 g by mouth 2 (two) times daily.   promethazine 12.5 MG tablet Commonly known as:  PHENERGAN Take 1 tablet (12.5 mg total) by mouth every 6 (six) hours as needed for nausea or vomiting.   simvastatin 20 MG tablet Commonly known as:  ZOCOR TAKE 1 TABLET BY MOUTH AT  BEDTIME   tadalafil 20 MG tablet Commonly known as:  ADCIRCA/CIALIS Take 20 mg by mouth daily as needed for erectile dysfunction.            Discharge Care Instructions  (From admission, onward)         Start     Ordered   11/05/17 0000  Change dressing    Comments:  DO NOT REMOVE BANDAGE OVER SURGICAL INCISION.  [place] WHOLE LEG INCLUDING OVER THE WATERPROOF BANDAGE WITH SOAP AND WATER EVERY DAY.   11/05/17 1013          Diagnostic Studies: Dg Knee Right Port  Result Date: 11/04/2017 CLINICAL DATA:  Post RIGHT total knee arthroplasty EXAM: PORTABLE RIGHT KNEE - 1-2 VIEW COMPARISON:  Portable exam 1142 hours without priors for comparison FINDINGS: Bones demineralized. Components of a RIGHT knee prosthesis are  identified in expected position. Quadriceps tendon silhouette is not adequately visualized. No fracture, dislocation, or bone destruction. No periprosthetic lucency. Scattered anterior soft tissue swelling and gas. IMPRESSION: RIGHT knee prosthesis without acute complication. Quadriceps tendon silhouette is poorly visualized question due to technique and anterior soft tissue swelling/gas; recommend clinical assessment of integrity. Electronically Signed   By: Lavonia Dana M.D.   On: 11/04/2017 12:04    Disposition:   Discharge Instructions    CPM   Complete by:  As directed    Continuous passive motion machine (CPM):  Use the CPM from 0 to 90 for 6 hours per day.       You may break it up into 2 or 3 sessions per day.      Use CPM for 2 weeks or until you are told to stop.   Call MD / Call 911   Complete by:  As directed    If you experience chest pain or shortness of breath, CALL 911 and be transported to the hospital emergency room.  If you develope a fever above 101 F, pus (white drainage) or increased drainage or redness at the wound, or calf pain, call your surgeon's office.   Change dressing   Complete by:  As directed    DO NOT REMOVE BANDAGE OVER SURGICAL INCISION.  [place] WHOLE LEG INCLUDING OVER THE WATERPROOF BANDAGE WITH SOAP AND WATER EVERY DAY.   Constipation Prevention   Complete by:  As directed    Drink plenty of fluids.  Prune juice may be helpful.  You may use a stool softener, such as Colace (over the counter) 100 mg twice a day.  Use MiraLax (over the counter) for constipation as needed.   Diet - low sodium heart healthy   Complete by:  As directed    Discharge instructions   Complete by:  As directed    INSTRUCTIONS AFTER JOINT REPLACEMENT   Remove items at home which could result in a fall. This includes throw rugs or furniture in walking pathways ICE to the affected joint every three hours while awake for 30 minutes at a time, for at least the first 3-5 days, and  then as needed for pain and swelling.  Continue to use ice for pain and swelling. You may notice swelling that will progress down to the foot and ankle.  This is normal after surgery.  Elevate your leg when you are not up walking on it.   Continue to use the breathing machine you got in the hospital (incentive spirometer) which will help keep your temperature down.  It is common for your temperature to cycle up and down following surgery, especially at night when you are not up moving around and exerting yourself.  The breathing machine keeps your lungs expanded and your temperature down.   DIET:  As you were doing prior to hospitalization, we recommend a well-balanced diet.  DRESSING / WOUND CARE / SHOWERING  Keep the surgical dressing until follow up.  The dressing is water proof, so you can shower without any extra covering.  IF THE DRESSING FALLS OFF or the wound gets wet inside, change the dressing with sterile gauze.  Please use good hand washing techniques before changing the dressing.  Do not use any lotions or creams on the incision until instructed by your surgeon.    ACTIVITY  Increase activity slowly as tolerated, but follow the weight bearing instructions below.   No driving for 6 weeks or until further direction given by your physician.  You cannot drive while taking narcotics.  No lifting or carrying greater than 10 lbs. until further directed by your surgeon. Avoid periods of inactivity such as sitting longer than an hour when not asleep. This helps prevent blood clots.  You may return to work once you are authorized by your doctor.     WEIGHT BEARING   Weight bearing as tolerated with assist device (walker, cane, etc) as directed, use it as long as suggested by your surgeon or therapist, typically at least 2-3 weeks.  EXERCISES  Results after joint replacement surgery are often greatly improved when you follow the exercise, range of motion and muscle strengthening  exercises prescribed by your doctor. Safety measures are also important to protect the joint from further injury. Any time any of these exercises cause you to have increased pain or swelling, decrease what you are doing until you are comfortable again and then slowly increase them. If you have problems or questions, call your caregiver or physical therapist for advice.   Rehabilitation is important following a joint replacement. After just a few days of immobilization, the muscles of the leg can become weakened and shrink (atrophy).  These exercises are designed to build up the tone and strength of the thigh and leg muscles and to improve motion. Often times heat used for twenty to thirty minutes before working out will loosen up your tissues and help with improving the range of motion but do not use heat for the first two weeks following surgery (sometimes heat can increase post-operative swelling).   These exercises can be done on a training (exercise) mat, on the floor, on a table or on a bed. Use whatever works the best and is most comfortable for you.    Use music or television while you are exercising so that the exercises are a pleasant break in your day. This will make your life better with the exercises acting as a break in your routine that you can look forward to.   Perform all exercises about fifteen times, three times per day or as directed.  You should exercise both the operative leg and the other leg as well.   Exercises include:  Quad Sets - Tighten up the muscle on the front of the thigh (Quad) and hold for 5-10 seconds.   Straight Leg Raises - With your knee straight (if you were given a brace, keep it on), lift the leg to 60 degrees, hold for 3 seconds, and slowly lower the leg.  Perform this exercise against resistance later as your leg gets stronger.  Leg Slides: Lying on your back, slowly slide your foot toward your buttocks, bending your knee up off the floor (only go as far as is  comfortable). Then slowly slide your foot back down until your leg is flat on the floor again.  Angel Wings: Lying on your back spread your legs to the side as far apart as you can without causing discomfort.  Hamstring Strength:  Lying on your back, push your heel against the floor with your leg straight by tightening up the muscles of your buttocks.  Repeat, but this time bend your knee to a comfortable angle, and push your heel against the floor.  You may put a pillow under the heel to make it more comfortable if necessary.   A rehabilitation program following joint replacement surgery can speed recovery and prevent re-injury in the future due to weakened muscles. Contact your doctor or a physical therapist for more information on knee rehabilitation.    CONSTIPATION  Constipation is defined medically as fewer than three stools per week and severe constipation as less than one stool per week.  Even if you have a regular bowel pattern at home, your normal regimen is likely to be disrupted due to multiple reasons following surgery.  Combination of anesthesia, postoperative narcotics, change in appetite and fluid intake all can affect your bowels.   YOU MUST use at least one of the following options; they are listed in order of  increasing strength to get the job done.  They are all available over the counter, and you may need to use some, POSSIBLY even all of these options:    Drink plenty of fluids (prune juice may be helpful) and high fiber foods Colace 100 mg by mouth twice a day  Senokot for constipation as directed and as needed Dulcolax (bisacodyl), take with full glass of water  Miralax (polyethylene glycol) once or twice a day as needed.  If you have tried all these things and are unable to have a bowel movement in the first 3-4 days after surgery call either your surgeon or your primary doctor.    If you experience loose stools or diarrhea, hold the medications until you stool forms back  up.  If your symptoms do not get better within 1 week or if they get worse, check with your doctor.  If you experience "the worst abdominal pain ever" or develop nausea or vomiting, please contact the office immediately for further recommendations for treatment.   ITCHING:  If you experience itching with your medications, try taking only a single pain pill, or even half a pain pill at a time.  You can also use Benadryl over the counter for itching or also to help with sleep.   TED HOSE STOCKINGS:  Use stockings on both legs until for at least 2 weeks or as directed by physician office. They may be removed at night for sleeping.  MEDICATIONS:  See your medication summary on the "After Visit Summary" that nursing will review with you.  You may have some home medications which will be placed on hold until you complete the course of blood thinner medication.  It is important for you to complete the blood thinner medication as prescribed.  PRECAUTIONS:  If you experience chest pain or shortness of breath - call 911 immediately for transfer to the hospital emergency department.   If you develop a fever greater that 101 F, purulent drainage from wound, increased redness or drainage from wound, foul odor from the wound/dressing, or calf pain - CONTACT YOUR SURGEON.                                                   FOLLOW-UP APPOINTMENTS:  If you do not already have a post-op appointment, please call the office for an appointment to be seen by your surgeon.  Guidelines for how soon to be seen are listed in your "After Visit Summary", but are typically between 1-4 weeks after surgery.  OTHER INSTRUCTIONS:   Knee Replacement:  Do not place pillow under knee, focus on keeping the knee straight while resting. CPM instructions: 0-90 degrees, 2 hours in the morning, 2 hours in the afternoon, and 2 hours in the evening. Place foam block, curve side up under heel at all times except when in CPM or when walking.  DO  NOT modify, tear, cut, or change the foam block in any way.  MAKE SURE YOU:  Understand these instructions.  Get help right away if you are not doing well or get worse.    Thank you for letting us be a part of your medical care team.  It is a privilege we respect greatly.  We hope these instructions will help you stay on track for a fast and full recovery!   Do not  put a pillow under the knee. Place it under the heel.   Complete by:  As directed    Place gray foam block, curve side up under heel at all times except when in CPM or when walking.  DO NOT modify, tear, cut, or change in any way the gray foam block.   Increase activity slowly as tolerated   Complete by:  As directed    Patient may shower   Complete by:  As directed    Aquacel dressing is water proof    Wash over it and the whole leg with soap and water at the end of your shower   TED hose   Complete by:  As directed    Use stockings (TED hose) for 2 weeks on both leg(s).  You may remove them at night for sleeping.      Follow-up Information    Elsie Saas, MD Follow up on 11/19/2017.   Specialty:  Orthopedic Surgery Why:  arrive in Physical Therapy at Dr Archie Endo office at 8:45 for 9 am physical therapy visit with Lytle Michaels and 10 am office visit with Dr Leotis Pain information: [place] [phone]        Home, [facility] Follow up.   Specialty:  [place] Why:  A representative from [facility] home will cobntantact you to arrange start date and time for your therapy. Contact information: [address] [place] [address] [phone]            Signed: Linda Hedges 11/06/2017, 11:11 AM

## 2017-11-17 ENCOUNTER — Other Ambulatory Visit: Payer: Self-pay | Admitting: Endocrinology

## 2017-12-10 ENCOUNTER — Ambulatory Visit: Payer: Medicare Other | Admitting: Endocrinology

## 2017-12-11 ENCOUNTER — Ambulatory Visit (INDEPENDENT_AMBULATORY_CARE_PROVIDER_SITE_OTHER): Payer: Medicare Other | Admitting: Endocrinology

## 2017-12-11 ENCOUNTER — Encounter (HOSPITAL_COMMUNITY): Payer: Self-pay | Admitting: Physician Assistant

## 2017-12-11 ENCOUNTER — Encounter: Payer: Self-pay | Admitting: Endocrinology

## 2017-12-11 VITALS — BP 102/70 | HR 80 | Ht 68.0 in | Wt 132.2 lb

## 2017-12-11 DIAGNOSIS — Z96653 Presence of artificial knee joint, bilateral: Secondary | ICD-10-CM

## 2017-12-11 DIAGNOSIS — E119 Type 2 diabetes mellitus without complications: Secondary | ICD-10-CM | POA: Diagnosis not present

## 2017-12-11 DIAGNOSIS — Z96651 Presence of right artificial knee joint: Secondary | ICD-10-CM

## 2017-12-11 DIAGNOSIS — M1712 Unilateral primary osteoarthritis, left knee: Secondary | ICD-10-CM

## 2017-12-11 HISTORY — DX: Presence of right artificial knee joint: Z96.651

## 2017-12-11 HISTORY — DX: Unilateral primary osteoarthritis, left knee: M17.12

## 2017-12-11 NOTE — Patient Instructions (Addendum)
Try reducing the metformin, bromocriptine, and victoza, until the heartburn gets better.   If you go on steroids, try doubling the nateglinide to control the blood sugar.  Please call of this doe not help.   Please come back for a follow-up appointment in 2 months.

## 2017-12-11 NOTE — Pre-Procedure Instructions (Signed)
Marlyn CorporalByron W Jutras  12/11/2017      [id number] [place], [place] - [address] [facility] [address] [place] [id number] Phone: [phone] Fax: [phone]  [facility] [place] - [id number] [facility] [address] [address] [place] [address] [place] [id number] Phone: [phone] Fax: [phone]    Your procedure is scheduled on Dec. 2  [facility]15 [facility].M.  Call this number if you have problems the morning of surgery:  [phone]   Remember:  Do not eat or drink after midnight.      Take these medicines the morning of surgery with A SIP OF WATER :              Tylenol if needed              Clomiphene (clomid)              Levothyroxine (synthroid)             mirabergron (myrbetriq)             Oxycodone IR if needed            7 days prior to surgery STOP taking any Aspirin(unless otherwise instructed by your surgeon), Aleve, Naproxen, Ibuprofen, Motrin, Advil, Goody's, BC's, all herbal medications, fish oil, and all vitamins                             Follow your surgeon's instructions on when to stop Asprin.  If no instructions were given by your surgeon then you will need to call the office to get those instructions.                         How to Manage Your Diabetes Before and After Surgery  Why is it important to control my blood sugar before and after surgery? . Improving blood sugar levels before and after surgery helps healing and can limit problems. . A way of improving blood sugar control is eating a healthy diet by: o  Eating less sugar and carbohydrates o  Increasing activity/exercise o  Talking with your doctor about reaching your blood sugar goals . High blood sugars (greater than 180 mg/dL) can raise your risk of infections and slow your recovery, so you will need to focus on controlling your diabetes during the weeks before surgery. . Make  sure that the doctor who takes care of your diabetes knows about your planned surgery including the date and location.  How do I manage my blood sugar before surgery? . Check your blood sugar at least 4 times a day, starting 2 days before surgery, to make sure that the level is not too high or low. o Check your blood sugar the morning of your surgery when you wake up and every 2 hours until you get to the Short Stay unit. . If your blood sugar is less than 70 mg/dL, you will need to treat for low blood sugar: o Do not take insulin. o Treat a low blood sugar (less than 70 mg/dL) with  cup of clear juice (cranberry or apple), 4 glucose tablets, OR glucose gel. Recheck blood sugar in 15 minutes after treatment (to make sure it is greater than 70 mg/dL). If your blood sugar is not greater than 70 mg/dL  on recheck, call [phone] o  for further instructions. . Report your blood sugar to the short stay nurse when you get to Short Stay.  . If you are admitted to the hospital after surgery: o Your blood sugar will be checked by the staff and you will probably be given insulin after surgery (instead of oral diabetes medicines) to make sure you have good blood sugar levels. o The goal for blood sugar control after surgery is 80-180 mg/dL.        WHAT DO I DO ABOUT MY DIABETES MEDICATION?  Marland Kitchen Do not take oral diabetes medicines (pills) the morning of surgery. ( actos, starlix, metformin, parlodel )    . The day of surgery, do not take other diabetes injectables, including Byetta (exenatide), Bydureon (exenatide ER), Victoza (liraglutide), or Trulicity (dulaglutide).        Do not wear jewelry.  Do not wear lotions, powders, or perfumes, or deodorant.  Do not shave 48 hours prior to surgery.  Men may shave face and neck.  Do not bring valuables to the hospital.  [facility] is not responsible for any belongings or valuables.  Contacts, dentures or bridgework may not be worn into  surgery.  Leave your suitcase in the car.  After surgery it may be brought to your room.  For patients admitted to the hospital, discharge time will be determined by your treatment team.  Patients discharged the day of surgery will not be allowed to drive home.    Special instructions:  [place]- Preparing For Surgery  Before surgery, you can play an important role. Because skin is not sterile, your skin needs to be as free of germs as possible. You can reduce the number of germs on your skin by washing with CHG (chlorahexidine gluconate) Soap before surgery.  CHG is an antiseptic cleaner which kills germs and bonds with the skin to continue killing germs even after washing.    Oral Hygiene is also important to reduce your risk of infection.  Remember - BRUSH YOUR TEETH THE MORNING OF SURGERY WITH YOUR REGULAR TOOTHPASTE  Please do not use if you have an allergy to CHG or antibacterial soaps. If your skin becomes reddened/irritated stop using the CHG.  Do not shave (including legs and underarms) for at least 48 hours prior to first CHG shower. It is OK to shave your face.  Please follow these instructions carefully.   1. Shower the NIGHT BEFORE SURGERY and the MORNING OF SURGERY with CHG.   2. If you chose to wash your hair, wash your hair first as usual with your normal shampoo.  3. After you shampoo, rinse your hair and body thoroughly to remove the shampoo.  4. Use CHG as you would any other liquid soap. You can apply CHG directly to the skin and wash gently with a scrungie or a clean washcloth.   5. Apply the CHG Soap to your body ONLY FROM THE NECK DOWN.  Do not use on open wounds or open sores. Avoid contact with your eyes, ears, mouth and genitals (private parts). Wash Face and genitals (private parts)  with your normal soap.  6. Wash thoroughly, paying special attention to the area where your surgery will be performed.  7. Thoroughly rinse your body with warm water from the  neck down.  8. DO NOT shower/wash with your normal soap after using and rinsing off the CHG Soap.  9. Pat yourself dry with a CLEAN TOWEL.  10. Wear CLEAN  PAJAMAS to bed the night before surgery, wear comfortable clothes the morning of surgery  11. Place CLEAN SHEETS on your bed the night of your first shower and DO NOT SLEEP WITH PETS.    Day of Surgery:  Do not apply any deodorants/lotions.  Please wear clean clothes to the hospital/surgery center.   Remember to brush your teeth WITH YOUR REGULAR TOOTHPASTE.    Please read over the following fact sheets that you were given. Coughing and Deep Breathing, MRSA Information and Surgical Site Infection Prevention

## 2017-12-11 NOTE — H&P (Addendum)
TOTAL KNEE ADMISSION H&P  Patient is being admitted for left total knee arthroplasty.  Subjective:  Chief Complaint:left knee pain.  HPI: David Maynard, 67 y.o. male, has a history of pain and functional disability in the left knee due to arthritis and has failed non-surgical conservative treatments for greater than 12 weeks to includeNSAID's and/or analgesics, corticosteriod injections, viscosupplementation injections, flexibility and strengthening excercises, supervised PT with diminished ADL's post treatment, use of assistive devices and activity modification.  Onset of symptoms was gradual, starting 10 years ago with gradually worsening course since that time. The patient noted prior procedures on the knee to include  arthroscopy, menisectomy and ACL reconstruction on the left knee(s).  Patient currently rates pain in the left knee(s) at 10 out of 10 with activity. Patient has night pain, worsening of pain with activity and weight bearing, pain that interferes with activities of daily living, crepitus and joint swelling.  Patient has evidence of subchondral sclerosis, periarticular osteophytes, joint space narrowing and with retained hardware by imaging studies.  There is no active infection.  Patient Active Problem List   Diagnosis Date Noted  . Primary localized osteoarthritis of left knee 12/11/2017  . 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 Primary localized osteoarthritis of right knee 10/23/2017  . Hematuria 03/18/2017  . Seasickness 02/16/2016  . Neck pain on right side 10/13/2015  . Dizziness and giddiness 09/22/2014  . Urinary system disease 07/05/2014  . Screening for prostate cancer 11/09/2013  . Routine general medical examination at a health care facility 11/06/2012  . Anemia 06/22/2011  . Lumbar disc disease 09/22/2010  . Diabetes mellitus, type II ([place])   . FOLLICULITIS 53/97/6734  . NUMBNESS 10/21/2009  . HEARING LOSS, RIGHT EAR 11/06/2007  . TINNITUS, RIGHT  08/21/2007  . Hypogonadism male 06/19/2007  . HYPERCHOLESTEROLEMIA 06/19/2007  . Depression 06/19/2007  . Hypothyroidism 03/05/2007  . Diabetes ([place]) 09/20/2006   Past Medical History:  Diagnosis Date  . Depressive disorder, not elsewhere classified    none  . Diabetes mellitus type II   . Hypercholesterolemia   . Hypogonadism male   . Primary localized osteoarthritis of left knee 12/11/2017  . Primary localized osteoarthritis of right knee 10/23/2017  . 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 Thyroid disease   . Unspecified hypothyroidism     Past Surgical History:  Procedure Laterality Date  . ESOPHAGOGASTRODUODENOSCOPY  06/16/2003  . INGUINAL HERNIA REPAIR     left  . JOINT REPLACEMENT    . KNEE ARTHROSCOPY     Left  . KNEE ARTHROSCOPY W/ ACL RECONSTRUCTION     left  . ROTATOR CUFF REPAIR     Right  . Stress Cardiolite  07/15/2000  . TOTAL KNEE ARTHROPLASTY Right 11/04/2017  . TOTAL KNEE ARTHROPLASTY Right 11/04/2017   Procedure: TOTAL KNEE ARTHROPLASTY;  Surgeon: Elsie Saas, MD;  Location: [place];  Service: Orthopedics;  Laterality: Right;  Marland Kitchen VASECTOMY  05/2002    No current facility-administered medications for this encounter.    Current Outpatient Medications  Medication Sig Dispense Refill Last Dose  . acetaminophen (TYLENOL) 500 MG tablet Take 2 tablets (1,000 mg total) by mouth every 6 (six) hours. (Patient not taking: Reported on 12/11/2017) 30 tablet 0 Not Taking  . bromocriptine (PARLODEL) 2.5 MG tablet Take 1 tablet (2.5 mg total) by mouth daily. 90 tablet 3 12/11/2017 at Unknown time  . clomiPHENE (CLOMID) 50 MG tablet TAKE ONE-HALF TABLET BY  MOUTH DAILY (Patient taking differently: Take  25 mg by mouth daily. ) 45 tablet 0 12/11/2017 at Unknown time  . colesevelam (WELCHOL) 625 MG tablet TAKE 2 TABLETS BY MOUTH  DAILY (Patient taking differently: Take 1,250 mg by mouth daily with breakfast. ) 180 tablet 0 12/11/2017 at Unknown time  . docusate  sodium (COLACE) 100 MG capsule 1 tab 2 times a day while on narcotics.  STOOL SOFTENER (Patient taking differently: Take 100 mg by mouth 2 (two) times daily. ) 60 capsule 0 12/11/2017 at Unknown time  . hydrocortisone cream 1 % Apply 1 application topically daily as needed for itching.   12/11/2017 at Unknown time  . levothyroxine (SYNTHROID, LEVOTHROID) 50 MCG tablet TAKE 1 TABLET BY MOUTH  DAILY BEFORE BREAKFAST (Patient taking differently: Take 50 mcg by mouth daily before breakfast. ) 90 tablet 3 12/11/2017 at Unknown time  . liraglutide (VICTOZA) 18 MG/3ML SOPN INJECT SUBCUTANEOUSLY 1.8MG EVERY MORNING (Patient taking differently: Inject 1.8 mg into the skin daily. ) 27 mL 11 12/11/2017 at Unknown time  . metFORMIN (GLUCOPHAGE-XR) 500 MG 24 hr tablet TAKE 2 TABLETS BY MOUTH TWO TIMES DAILY (Patient taking differently: Take 1,000 mg by mouth 2 (two) times daily. ) 360 tablet 3 12/11/2017 at Unknown time  . mirabegron ER (MYRBETRIQ) 25 MG TB24 tablet Take 25 mg by mouth 2 (two) times daily.    12/11/2017 at Unknown time  . nateglinide (STARLIX) 60 MG tablet Take 1 tablet (60 mg total) by mouth 3 (three) times daily with meals. 270 tablet 3 12/11/2017 at Unknown time  . pioglitazone (ACTOS) 45 MG tablet Take 1 tablet (45 mg total) by mouth daily. 90 tablet 3 12/11/2017 at Unknown time  . polyethylene glycol (MIRALAX / GLYCOLAX) packet Take 17 g by mouth 2 (two) times daily. 14 each 0 Past Week at Unknown time  . simvastatin (ZOCOR) 20 MG tablet TAKE 1 TABLET BY MOUTH AT  BEDTIME (Patient taking differently: Take 10 mg by mouth at bedtime. ) 90 tablet 3 12/11/2017 at Unknown time  . aspirin EC 325 MG EC tablet Take 1 tablet (325 mg total) by mouth daily with breakfast. (Patient not taking: Reported on 12/11/2017) 30 tablet 0 Not Taking  . Blood Glucose Monitoring Suppl (ACCU-CHEK NANO SMARTVIEW) w/Device KIT Use to check blood sugar 6 times per day. 1 kit 0 Taking  . EASY TOUCH PEN NEEDLES 32G X 6 MM  MISC USE ONCE DAILY IN THE  MORNING 90 each 4 Taking  . gabapentin (NEURONTIN) 300 MG capsule Take 1 capsule (300 mg total) by mouth at bedtime. (Patient not taking: Reported on 12/11/2017) 30 capsule 0 Not Taking  . promethazine (PHENERGAN) 12.5 MG tablet Take 1 tablet (12.5 mg total) by mouth every 6 (six) hours as needed for nausea or vomiting. (Patient not taking: Reported on 12/11/2017) 20 tablet 0 Not Taking  . tadalafil (ADCIRCA/CIALIS) 20 MG tablet Take 20 mg by mouth daily as needed for erectile dysfunction.   Not Taking   No Known Allergies  Social History   Tobacco Use  . Smoking status: Never Smoker  . Smokeless tobacco: Never Used  Substance Use Topics  . Alcohol use: Yes    Alcohol/week: 1.0 standard drinks    Types: 1 Cans of beer per week    Comment: weekly    Family History  Problem Relation Age of Onset  . Breast cancer Neg Hx   . Celiac disease Neg Hx   . Cirrhosis Neg Hx   . Clotting disorder Neg Hx   .  Colitis Neg Hx   . Colon cancer Neg Hx   . Colon polyps Neg Hx   . Crohn's disease Neg Hx   . Cystic fibrosis Neg Hx   . Diabetes Neg Hx   . Esophageal cancer Neg Hx   . Heart disease Neg Hx   . Hemochromatosis Neg Hx   . Inflammatory bowel disease Neg Hx   . Irritable bowel syndrome Neg Hx   . Kidney disease Neg Hx   . Liver cancer Neg Hx   . Liver disease Neg Hx   . Ovarian cancer Neg Hx   . Pancreatic cancer Neg Hx   . Prostate cancer Neg Hx   . Rectal cancer Neg Hx   . Stomach cancer Neg Hx   . Ulcerative colitis Neg Hx   . Uterine cancer Neg Hx   . Wilson's disease Neg Hx      Review of Systems  Constitutional: Negative.   HENT: Negative.   Eyes: Negative.   Respiratory: Negative.   Cardiovascular: Negative.   Gastrointestinal: Negative.   Genitourinary: Negative.   Musculoskeletal: Positive for back pain and joint pain.  Skin: Negative.   Neurological: Negative.   Endo/Heme/Allergies: Negative.   Psychiatric/Behavioral: Negative.      Objective:  Physical Exam  Constitutional: He is oriented to person, place, and time. He appears well-developed and well-nourished.  HENT:  Head: Normocephalic and atraumatic.  Mouth/Throat: Oropharynx is clear and moist.  Eyes: Pupils are equal, round, and reactive to light. Conjunctivae and EOM are normal.  Neck: Neck supple.  Cardiovascular: Normal rate, regular rhythm and intact distal pulses.  Respiratory: Effort normal and breath sounds normal.  GI: Soft. Bowel sounds are normal.  Genitourinary:  Genitourinary Comments: Not pertinent to current symptomatology therefore not examined.  Musculoskeletal:  He is independently ambulatory with a slightly antalgic gait and the assistance of a cane.  He has active range of motion 0-110 degrees.  Moderate effusion.  Well healed surgical scar.  Distal neurovascular exam is intact.  Left knee varus deformity.  -5 to 120.  2+ crep, 2+ synovitis. DNVI.  Neurological: He is alert and oriented to person, place, and time.  Skin: Skin is warm and dry.  Psychiatric: He has a normal mood and affect. His behavior is normal.    Vital signs in last 24 hours: Temp:  [97.7 F (36.5 C)] 97.7 F (36.5 C) (11/20 1500) Pulse Rate:  [66-80] 66 (11/20 1500) BP: (102-128)/(64-70) 128/64 (11/20 1500) SpO2:  [97 %] 97 % (11/20 1500) Weight:  [60 kg] 60 kg (11/20 1042)  Labs:   Estimated body mass index is 20.1 kg/m as calculated from the following:   Height as of 12/11/17: 5' 8" (1.727 m).   Weight as of 12/11/17: 60 kg.   Imaging Review Plain radiographs demonstrate severe degenerative joint disease of the right knee(s). The overall alignment issignificant valgus. The bone quality appears to be good for age and reported activity level.   Preoperative templating of the joint replacement has been completed, documented, and submitted to the Operating Room personnel in order to optimize intra-operative equipment management.   Anticipated LOS  equal to or greater than 2 midnights due to - Age 54 and older with one or more of the following:  - Obesity  - Expected need for hospital services (PT, OT, Nursing) required for safe  discharge  - Anticipated need for postoperative skilled nursing care or inpatient rehab  - Active co-morbidities: None OR   -  Unanticipated findings during/Post Surgery: None  - Patient is a high risk of re-admission due to: None     Assessment/Plan:  End stage arthritis, left knee VALGUS Principal Problem:   Primary localized osteoarthritis of left knee Active Problems:   Hypothyroidism   Diabetes ([place])   Depression   Diabetes mellitus, type II ([place])   Anemia   S/P total knee arthroplasty, right   The patient history, physical examination, clinical judgment of the provider and imaging studies are consistent with end stage degenerative joint disease of the left knee(s) and total knee arthroplasty is deemed medically necessary. The treatment options including medical management, injection therapy arthroscopy and arthroplasty were discussed at length. The risks and benefits of total knee arthroplasty were presented and reviewed. The risks due to aseptic loosening, infection, stiffness, patella tracking problems, thromboembolic complications and other imponderables were discussed. The patient acknowledged the explanation, agreed to proceed with the plan and consent was signed. Patient is being admitted for inpatient treatment for surgery, pain control, PT, OT, prophylactic antibiotics, VTE prophylaxis, progressive ambulation and ADL's and discharge planning. The patient is planning to be discharged home with home health services

## 2017-12-11 NOTE — Progress Notes (Signed)
 Subjective:    Patient ID: David Maynard, male    DOB: 03/06/1950, 67 y.o.   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   Dx'ed: 2008 Complications: renal insufficiency.   Therapy: victoza + 4 oral meds.  DKA: never Severe hypoglycemia: never.  Pancreatitis: never.  Other: he has never taken insulin.   Interval history: pt states he feels well in general.  He will have the other TKR in 12 days.  Meter is downloaded today, and the printout is scanned into the record.  cbg varies from 80-240.   Past Medical History:  Diagnosis Date  . Anemia    history of anemia  . Depressive disorder, not elsewhere classified    none  . Diabetes mellitus type II   . Hypercholesterolemia   . Hypogonadism male   . Primary localized osteoarthritis of left knee 12/11/2017  . Primary localized osteoarthritis of right knee 10/23/2017  . 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 Thyroid disease   . Unspecified hypothyroidism     Past Surgical History:  Procedure Laterality Date  . ESOPHAGOGASTRODUODENOSCOPY  06/16/2003  . INGUINAL HERNIA REPAIR     left  . JOINT REPLACEMENT    . KNEE ARTHROSCOPY     Left  . KNEE ARTHROSCOPY W/ ACL RECONSTRUCTION     left  . ROTATOR CUFF REPAIR     Right x2  . Stress Cardiolite  07/15/2000  . TOTAL KNEE ARTHROPLASTY Right 11/04/2017  . TOTAL KNEE ARTHROPLASTY Right 11/04/2017   Procedure: TOTAL KNEE ARTHROPLASTY;  Surgeon: Wainer, Robert, MD;  Location: [facility] OR;  Service: Orthopedics;  Laterality: Right;  . VASECTOMY  05/2002    Social History   Socioeconomic History  . Marital status: Married    Spouse name: Not on file  . Number of children: Not on file  . Years of education: Not on file  . Highest education level: Not on file  Occupational History  . Not on file  Social Needs  . Financial resource strain: Not on file  . Food  insecurity:    Worry: Not on file    Inability: Not on file  . Transportation needs:    Medical: Not on file    Non-medical: Not on file  Tobacco Use  . Smoking status: Never Smoker  . Smokeless tobacco: Never Used  Substance and Sexual Activity  . Alcohol use: Yes    Alcohol/week: 1.0 standard drinks    Types: 1 Cans of beer per week    Comment: weekly  . Drug use: No  . Sexual activity: Not on file  Lifestyle  . Physical activity:    Days per week: Not on file    Minutes per session: Not on file  . Stress: Not on file  Relationships  . Social connections:    Talks on phone: Not on file    Gets together: Not on file    Attends religious service: Not on file    Active member of club or organization: Not on file    Attends meetings of clubs or organizations: Not on file    Relationship status: Not on file  . Intimate partner violence:    Fear of current or ex partner: Not on file    Emotionally abused: Not on file    Physically abused: Not on file      Forced sexual activity: Not on file  Other Topics Concern  . Not on file  Social History Narrative  . Not on file    Current Outpatient Medications on File Prior to Visit  Medication Sig Dispense Refill  . Blood Glucose Monitoring Suppl (ACCU-CHEK NANO SMARTVIEW) w/Device KIT Use to check blood sugar 6 times per day. 1 kit 0  . bromocriptine (PARLODEL) 2.5 MG tablet Take 1 tablet (2.5 mg total) by mouth daily. 90 tablet 3  . clomiPHENE (CLOMID) 50 MG tablet TAKE ONE-HALF TABLET BY  MOUTH DAILY (Patient taking differently: Take 25 mg by mouth daily. ) 45 tablet 0  . colesevelam (WELCHOL) 625 MG tablet TAKE 2 TABLETS BY MOUTH  DAILY (Patient taking differently: Take 1,250 mg by mouth daily with breakfast. ) 180 tablet 0  . docusate sodium (COLACE) 100 MG capsule 1 tab 2 times a day while on narcotics.  STOOL SOFTENER (Patient taking differently: Take 100 mg by mouth 2 (two) times daily. ) 60 capsule 0  . EASY TOUCH PEN NEEDLES  32G X 6 MM MISC USE ONCE DAILY IN THE  MORNING 90 each 4  . hydrocortisone cream 1 % Apply 1 application topically daily as needed for itching.    . levothyroxine (SYNTHROID, LEVOTHROID) 50 MCG tablet TAKE 1 TABLET BY MOUTH  DAILY BEFORE BREAKFAST (Patient taking differently: Take 50 mcg by mouth daily before breakfast. ) 90 tablet 3  . liraglutide (VICTOZA) 18 MG/3ML SOPN INJECT SUBCUTANEOUSLY 1.8MG EVERY MORNING (Patient taking differently: Inject 1.8 mg into the skin daily. ) 27 mL 11  . metFORMIN (GLUCOPHAGE-XR) 500 MG 24 hr tablet TAKE 2 TABLETS BY MOUTH TWO TIMES DAILY (Patient taking differently: Take 1,000 mg by mouth 2 (two) times daily. ) 360 tablet 3  . mirabegron ER (MYRBETRIQ) 25 MG TB24 tablet Take 25 mg by mouth 2 (two) times daily.     . nateglinide (STARLIX) 60 MG tablet Take 1 tablet (60 mg total) by mouth 3 (three) times daily with meals. 270 tablet 3  . pioglitazone (ACTOS) 45 MG tablet Take 1 tablet (45 mg total) by mouth daily. 90 tablet 3  . simvastatin (ZOCOR) 20 MG tablet TAKE 1 TABLET BY MOUTH AT  BEDTIME (Patient taking differently: Take 10 mg by mouth at bedtime. ) 90 tablet 3  . acetaminophen (TYLENOL) 500 MG tablet Take 2 tablets (1,000 mg total) by mouth every 6 (six) hours. (Patient not taking: Reported on 12/11/2017) 30 tablet 0  . aspirin EC 325 MG EC tablet Take 1 tablet (325 mg total) by mouth daily with breakfast. (Patient not taking: Reported on 12/11/2017) 30 tablet 0  . gabapentin (NEURONTIN) 300 MG capsule Take 1 capsule (300 mg total) by mouth at bedtime. (Patient not taking: Reported on 12/11/2017) 30 capsule 0  . polyethylene glycol (MIRALAX / GLYCOLAX) packet Take 17 g by mouth 2 (two) times daily. 14 each 0  . promethazine (PHENERGAN) 12.5 MG tablet Take 1 tablet (12.5 mg total) by mouth every 6 (six) hours as needed for nausea or vomiting. (Patient not taking: Reported on 12/11/2017) 20 tablet 0  . tadalafil (ADCIRCA/CIALIS) 20 MG tablet Take 20 mg by  mouth daily as needed for erectile dysfunction.     No current facility-administered medications on file prior to visit.     No Known Allergies  Family History  Problem Relation Age of Onset  . Breast cancer Neg Hx   . Celiac disease Neg Hx   . Cirrhosis Neg Hx   .   Clotting disorder Neg Hx   . Colitis Neg Hx   . Colon cancer Neg Hx   . Colon polyps Neg Hx   . Crohn's disease Neg Hx   . Cystic fibrosis Neg Hx   . Diabetes Neg Hx   . Esophageal cancer Neg Hx   . Heart disease Neg Hx   . Hemochromatosis Neg Hx   . Inflammatory bowel disease Neg Hx   . Irritable bowel syndrome Neg Hx   . Kidney disease Neg Hx   . Liver cancer Neg Hx   . Liver disease Neg Hx   . Ovarian cancer Neg Hx   . Pancreatic cancer Neg Hx   . Prostate cancer Neg Hx   . Rectal cancer Neg Hx   . Stomach cancer Neg Hx   . Ulcerative colitis Neg Hx   . Uterine cancer Neg Hx   . Wilson's disease Neg Hx     BP 102/70 (BP Location: Right Arm, Patient Position: Sitting, Cuff Size: Normal)   Pulse 80   Ht 5' 8" (1.727 m)   Wt 132 lb 3.2 oz (60 kg)   SpO2 97%   BMI 20.10 kg/m   Review of Systems He has mild heartburn    Objective:   Physical Exam VITAL SIGNS:  See vs page GENERAL: no distress Pulses: dorsalis pedis intact bilat.   MSK: no deformity of the feet CV: no leg edema Skin:  no ulcer on the feet.  normal color and temp on the feet. Neuro: sensation is intact to touch on the feet Ext: bandage on right great toe (nail separated).  No drainage or erythema.     Lab Results  Component Value Date   HGBA1C 6.6 (H) 11/04/2017       Assessment & Plan:  Insulin-requiring type 2 DM, with renal insuff: well-controlled.   Knee pain: he may receive another steroid injection.  Heartburn: prob related to recent surgery.   Patient Instructions  Try reducing the metformin, bromocriptine, and victoza, until the heartburn gets better.   If you go on steroids, try doubling the nateglinide to  control the blood sugar.  Please call of this doe not help.   Please come back for a follow-up appointment in 2 months.     

## 2017-12-12 ENCOUNTER — Other Ambulatory Visit: Payer: Self-pay

## 2017-12-12 ENCOUNTER — Encounter (HOSPITAL_COMMUNITY): Payer: Self-pay

## 2017-12-12 ENCOUNTER — Encounter (HOSPITAL_COMMUNITY)
Admission: RE | Admit: 2017-12-12 | Discharge: 2017-12-12 | Disposition: A | Discharge status: Home / Self Care | Payer: Medicare Other | Source: Ambulatory Visit | Attending: Orthopedic Surgery | Admitting: Orthopedic Surgery

## 2017-12-12 DIAGNOSIS — Z01812 Encounter for preprocedural laboratory examination: Secondary | ICD-10-CM | POA: Insufficient documentation

## 2017-12-12 HISTORY — DX: Anemia, unspecified: D64.9

## 2017-12-12 LAB — CBC WITH DIFFERENTIAL/PLATELET
Abs Immature Granulocytes: 0.02 K/uL (ref 0.00–0.07)
Basophils Absolute: 0 K/uL (ref 0.0–0.1)
Basophils Relative: 1 %
Eosinophils Absolute: 0.2 K/uL (ref 0.0–0.5)
Eosinophils Relative: 4 %
HCT: 39.4 % (ref 39.0–52.0)
Hemoglobin: 12 g/dL — ABNORMAL LOW (ref 13.0–17.0)
Immature Granulocytes: 0 %
Lymphocytes Relative: 18 %
Lymphs Abs: 1.1 K/uL (ref 0.7–4.0)
MCH: 29.3 pg (ref 26.0–34.0)
MCHC: 30.5 g/dL (ref 30.0–36.0)
MCV: 96.1 fL (ref 80.0–100.0)
Monocytes Absolute: 0.4 K/uL (ref 0.1–1.0)
Monocytes Relative: 6 %
Neutro Abs: 4.3 K/uL (ref 1.7–7.7)
Neutrophils Relative %: 71 %
Platelets: 217 K/uL (ref 150–400)
RBC: 4.1 MIL/uL — ABNORMAL LOW (ref 4.22–5.81)
RDW: 14.3 % (ref 11.5–15.5)
WBC: 6.1 K/uL (ref 4.0–10.5)
nRBC: 0 % (ref 0.0–0.2)

## 2017-12-12 LAB — COMPREHENSIVE METABOLIC PANEL
ALBUMIN: 3.7 g/dL (ref 3.5–5.0)
ALT: 25 U/L (ref 0–44)
AST: 26 U/L (ref 15–41)
Alkaline Phosphatase: 54 U/L (ref 38–126)
Anion gap: 7 (ref 5–15)
BILIRUBIN TOTAL: 0.5 mg/dL (ref 0.3–1.2)
BUN: 19 mg/dL (ref 8–23)
CALCIUM: 9.1 mg/dL (ref 8.9–10.3)
CHLORIDE: 98 mmol/L (ref 98–111)
CO2: 28 mmol/L (ref 22–32)
Creatinine, Ser: 1.07 mg/dL (ref 0.61–1.24)
GFR calc Af Amer: >60 mL/min (ref >60)
GFR calc non Af Amer: >60 mL/min (ref >60)
Glucose, Bld: 156 mg/dL — ABNORMAL HIGH (ref 70–99)
POTASSIUM: 4.2 mmol/L (ref 3.5–5.1)
Sodium: 133 mmol/L — ABNORMAL LOW (ref 135–145)
Total Protein: 6.2 g/dL — ABNORMAL LOW (ref 6.5–8.1)

## 2017-12-12 LAB — PROTIME-INR
INR: 1
Prothrombin Time: 13.1 seconds (ref 11.4–15.2)

## 2017-12-12 LAB — GLUCOSE, CAPILLARY: Glucose-Capillary: 150 mg/dL — ABNORMAL HIGH (ref 70–99)

## 2017-12-12 LAB — SURGICAL PCR SCREEN
MRSA, PCR: NEGATIVE
STAPHYLOCOCCUS AUREUS: NEGATIVE

## 2017-12-12 LAB — TYPE AND SCREEN
ABO/RH(D): O POS
Antibody Screen: NEGATIVE

## 2017-12-12 LAB — APTT: aPTT: 31 seconds (ref 24–36)

## 2017-12-12 NOTE — Progress Notes (Signed)
PCP: Dr. Everardo AllEllison  No cardiologist  Fasting blood sugars 110-130

## 2017-12-13 LAB — URINE CULTURE: CULTURE: NO GROWTH

## 2017-12-20 MED ORDER — BUPIVACAINE LIPOSOME 1.3 % IJ SUSP
20.0000 mL | INTRAMUSCULAR | Status: DC
  Filled 2017-12-20: qty 20

## 2017-12-20 MED ORDER — TRANEXAMIC ACID-NACL 1000-0.7 MG/100ML-% IV SOLN
1000.0000 mg | INTRAVENOUS | Status: AC
  Administered 2017-12-23: 1000 mg via INTRAVENOUS
  Filled 2017-12-20: qty 100

## 2017-12-23 ENCOUNTER — Inpatient Hospital Stay (HOSPITAL_COMMUNITY): Payer: Medicare Other | Admitting: Certified Registered Nurse Anesthetist

## 2017-12-23 ENCOUNTER — Encounter (HOSPITAL_COMMUNITY): Payer: Self-pay | Admitting: Certified Registered Nurse Anesthetist

## 2017-12-23 ENCOUNTER — Encounter (HOSPITAL_COMMUNITY)
Admission: RE | Disposition: A | Discharge status: Home Health | Payer: Self-pay | Source: Ambulatory Visit | Attending: Orthopedic Surgery

## 2017-12-23 ENCOUNTER — Inpatient Hospital Stay (HOSPITAL_COMMUNITY): Payer: Medicare Other | Admitting: Physician Assistant

## 2017-12-23 ENCOUNTER — Inpatient Hospital Stay (HOSPITAL_COMMUNITY)
Admission: RE | Admit: 2017-12-23 | Discharge: 2017-12-24 | DRG: 470 | Disposition: A | Discharge status: Home Health | Payer: Medicare Other | Source: Ambulatory Visit | Attending: Orthopedic Surgery | Admitting: Orthopedic Surgery

## 2017-12-23 DIAGNOSIS — E119 Type 2 diabetes mellitus without complications: Secondary | ICD-10-CM | POA: Diagnosis present

## 2017-12-23 DIAGNOSIS — Z7984 Long term (current) use of oral hypoglycemic drugs: Secondary | ICD-10-CM | POA: Diagnosis not present

## 2017-12-23 DIAGNOSIS — Z96653 Presence of artificial knee joint, bilateral: Secondary | ICD-10-CM

## 2017-12-23 DIAGNOSIS — H9191 Unspecified hearing loss, right ear: Secondary | ICD-10-CM | POA: Diagnosis present

## 2017-12-23 DIAGNOSIS — E291 Testicular hypofunction: Secondary | ICD-10-CM | POA: Diagnosis present

## 2017-12-23 DIAGNOSIS — E7800 Pure hypercholesterolemia, unspecified: Secondary | ICD-10-CM | POA: Diagnosis present

## 2017-12-23 DIAGNOSIS — Z79890 Hormone replacement therapy (postmenopausal): Secondary | ICD-10-CM | POA: Diagnosis not present

## 2017-12-23 DIAGNOSIS — E1165 Type 2 diabetes mellitus with hyperglycemia: Secondary | ICD-10-CM

## 2017-12-23 DIAGNOSIS — Z7982 Long term (current) use of aspirin: Secondary | ICD-10-CM | POA: Diagnosis not present

## 2017-12-23 DIAGNOSIS — D649 Anemia, unspecified: Secondary | ICD-10-CM | POA: Diagnosis present

## 2017-12-23 DIAGNOSIS — M1712 Unilateral primary osteoarthritis, left knee: Secondary | ICD-10-CM | POA: Diagnosis present

## 2017-12-23 DIAGNOSIS — Z79899 Other long term (current) drug therapy: Secondary | ICD-10-CM | POA: Diagnosis not present

## 2017-12-23 DIAGNOSIS — M21062 Valgus deformity, not elsewhere classified, left knee: Secondary | ICD-10-CM | POA: Diagnosis present

## 2017-12-23 DIAGNOSIS — E039 Hypothyroidism, unspecified: Secondary | ICD-10-CM | POA: Diagnosis present

## 2017-12-23 DIAGNOSIS — F32A Depression, unspecified: Secondary | ICD-10-CM | POA: Diagnosis present

## 2017-12-23 DIAGNOSIS — F329 Major depressive disorder, single episode, unspecified: Secondary | ICD-10-CM | POA: Diagnosis present

## 2017-12-23 DIAGNOSIS — Z96651 Presence of right artificial knee joint: Secondary | ICD-10-CM | POA: Diagnosis present

## 2017-12-23 HISTORY — DX: Unilateral primary osteoarthritis, left knee: M17.12

## 2017-12-23 HISTORY — DX: Presence of right artificial knee joint: Z96.651

## 2017-12-23 HISTORY — PX: TOTAL KNEE ARTHROPLASTY: SHX125

## 2017-12-23 LAB — GLUCOSE, CAPILLARY
Glucose-Capillary: 133 mg/dL — ABNORMAL HIGH (ref 70–99)
Glucose-Capillary: 116 mg/dL — ABNORMAL HIGH (ref 70–99)
Glucose-Capillary: 240 mg/dL — ABNORMAL HIGH (ref 70–99)
Glucose-Capillary: 235 mg/dL — ABNORMAL HIGH (ref 70–99)

## 2017-12-23 SURGERY — ARTHROPLASTY, KNEE, TOTAL
Anesthesia: Spinal | Site: Knee | Laterality: Left

## 2017-12-23 MED ORDER — CHLORHEXIDINE GLUCONATE 4 % EX LIQD: 60.0000 mL | Freq: Once | CUTANEOUS | Status: DC

## 2017-12-23 MED ORDER — BUPIVACAINE-EPINEPHRINE 0.5% -1:200000 IJ SOLN
INTRAMUSCULAR | Status: DC | PRN
  Administered 2017-12-23: 30 mL

## 2017-12-23 MED ORDER — FENTANYL CITRATE (PF) 100 MCG/2ML IJ SOLN
100.0000 ug | Freq: Once | INTRAMUSCULAR | Status: AC
  Administered 2017-12-23: 50 ug via INTRAVENOUS
  Filled 2017-12-23: qty 2

## 2017-12-23 MED ORDER — INSULIN ASPART 100 UNIT/ML ~~LOC~~ SOLN: 0.0000 [IU] | Freq: Every day | SUBCUTANEOUS | Status: DC

## 2017-12-23 MED ORDER — OXYCODONE HCL 5 MG PO TABS: 5.0000 mg | ORAL_TABLET | Freq: Once | ORAL | Status: DC | PRN

## 2017-12-23 MED ORDER — MENTHOL 3 MG MT LOZG: 1.0000 | LOZENGE | OROMUCOSAL | Status: DC | PRN

## 2017-12-23 MED ORDER — DEXAMETHASONE SODIUM PHOSPHATE 10 MG/ML IJ SOLN
10.0000 mg | Freq: Three times a day (TID) | INTRAMUSCULAR | Status: DC
  Administered 2017-12-23 – 2017-12-24 (×3): 10 mg via INTRAVENOUS
  Filled 2017-12-23 (×3): qty 1

## 2017-12-23 MED ORDER — SUCCINYLCHOLINE CHLORIDE 200 MG/10ML IV SOSY
PREFILLED_SYRINGE | INTRAVENOUS | Status: AC
  Filled 2017-12-23: qty 10

## 2017-12-23 MED ORDER — DEXAMETHASONE SODIUM PHOSPHATE 10 MG/ML IJ SOLN
INTRAMUSCULAR | Status: DC | PRN
  Administered 2017-12-23: 10 mg via INTRAVENOUS

## 2017-12-23 MED ORDER — PROPOFOL 500 MG/50ML IV EMUL
INTRAVENOUS | Status: DC | PRN
  Administered 2017-12-23: 75 ug/kg/min via INTRAVENOUS

## 2017-12-23 MED ORDER — ASPIRIN EC 325 MG PO TBEC
325.0000 mg | DELAYED_RELEASE_TABLET | Freq: Every day | ORAL | Status: DC
  Administered 2017-12-24: 325 mg via ORAL
  Filled 2017-12-23: qty 1

## 2017-12-23 MED ORDER — PHENYLEPHRINE 40 MCG/ML (10ML) SYRINGE FOR IV PUSH (FOR BLOOD PRESSURE SUPPORT)
PREFILLED_SYRINGE | INTRAVENOUS | Status: AC
  Filled 2017-12-23: qty 10

## 2017-12-23 MED ORDER — PROPOFOL 10 MG/ML IV BOLUS
INTRAVENOUS | Status: DC | PRN
  Administered 2017-12-23: 30 mg via INTRAVENOUS

## 2017-12-23 MED ORDER — CEFUROXIME SODIUM 1.5 G IV SOLR
INTRAVENOUS | Status: AC
  Filled 2017-12-23: qty 1.5

## 2017-12-23 MED ORDER — 0.9 % SODIUM CHLORIDE (POUR BTL) OPTIME
TOPICAL | Status: DC | PRN
  Administered 2017-12-23: 1000 mL

## 2017-12-23 MED ORDER — EPHEDRINE 5 MG/ML INJ
INTRAVENOUS | Status: AC
  Filled 2017-12-23: qty 10

## 2017-12-23 MED ORDER — OXYCODONE HCL 5 MG/5ML PO SOLN: 5.0000 mg | Freq: Once | ORAL | Status: DC | PRN

## 2017-12-23 MED ORDER — INSULIN ASPART 100 UNIT/ML ~~LOC~~ SOLN
0.0000 [IU] | Freq: Every day | SUBCUTANEOUS | Status: DC
  Administered 2017-12-23: 2 [IU] via SUBCUTANEOUS

## 2017-12-23 MED ORDER — INSULIN ASPART 100 UNIT/ML ~~LOC~~ SOLN
0.0000 [IU] | Freq: Three times a day (TID) | SUBCUTANEOUS | Status: DC
  Administered 2017-12-23: 5 [IU] via SUBCUTANEOUS
  Administered 2017-12-24: 8 [IU] via SUBCUTANEOUS
  Administered 2017-12-24: 3 [IU] via SUBCUTANEOUS

## 2017-12-23 MED ORDER — LEVOTHYROXINE SODIUM 50 MCG PO TABS
50.0000 ug | ORAL_TABLET | Freq: Every day | ORAL | Status: DC
  Administered 2017-12-24: 50 ug via ORAL
  Filled 2017-12-23: qty 1

## 2017-12-23 MED ORDER — CLOMIPHENE CITRATE 50 MG PO TABS
25.0000 mg | ORAL_TABLET | Freq: Every day | ORAL | Status: DC
  Administered 2017-12-24: 25 mg via ORAL
  Filled 2017-12-23: qty 1

## 2017-12-23 MED ORDER — INSULIN ASPART 100 UNIT/ML ~~LOC~~ SOLN: 0.0000 [IU] | Freq: Three times a day (TID) | SUBCUTANEOUS | Status: DC

## 2017-12-23 MED ORDER — MIDAZOLAM HCL 2 MG/2ML IJ SOLN
2.0000 mg | Freq: Once | INTRAMUSCULAR | Status: AC
  Administered 2017-12-23: 2 mg via INTRAVENOUS
  Filled 2017-12-23: qty 2

## 2017-12-23 MED ORDER — ONDANSETRON HCL 4 MG/2ML IJ SOLN
INTRAMUSCULAR | Status: AC
  Filled 2017-12-23: qty 6

## 2017-12-23 MED ORDER — SIMVASTATIN 20 MG PO TABS
10.0000 mg | ORAL_TABLET | Freq: Every day | ORAL | Status: DC
  Administered 2017-12-23: 10 mg via ORAL
  Filled 2017-12-23: qty 1

## 2017-12-23 MED ORDER — BUPIVACAINE LIPOSOME 1.3 % IJ SUSP
INTRAMUSCULAR | Status: DC | PRN
  Administered 2017-12-23: 20 mL

## 2017-12-23 MED ORDER — GABAPENTIN 300 MG PO CAPS
300.0000 mg | ORAL_CAPSULE | Freq: Every day | ORAL | Status: DC
  Administered 2017-12-23: 300 mg via ORAL
  Filled 2017-12-23: qty 1

## 2017-12-23 MED ORDER — CEFAZOLIN SODIUM-DEXTROSE 2-4 GM/100ML-% IV SOLN
2.0000 g | Freq: Four times a day (QID) | INTRAVENOUS | Status: AC
  Administered 2017-12-24 (×2): 2 g via INTRAVENOUS
  Filled 2017-12-23 (×3): qty 100

## 2017-12-23 MED ORDER — BUPIVACAINE IN DEXTROSE 0.75-8.25 % IT SOLN
INTRATHECAL | Status: DC | PRN
  Administered 2017-12-23: 1.8 mL via INTRATHECAL

## 2017-12-23 MED ORDER — OXYCODONE HCL 5 MG PO TABS
5.0000 mg | ORAL_TABLET | ORAL | Status: DC | PRN
  Administered 2017-12-23 – 2017-12-24 (×3): 10 mg via ORAL
  Filled 2017-12-23 (×3): qty 2

## 2017-12-23 MED ORDER — ONDANSETRON HCL 4 MG/2ML IJ SOLN
INTRAMUSCULAR | Status: DC | PRN
  Administered 2017-12-23 (×2): 4 mg via INTRAVENOUS

## 2017-12-23 MED ORDER — PIOGLITAZONE HCL 45 MG PO TABS
45.0000 mg | ORAL_TABLET | Freq: Every day | ORAL | Status: DC
  Administered 2017-12-24: 45 mg via ORAL
  Filled 2017-12-23: qty 1

## 2017-12-23 MED ORDER — SODIUM CHLORIDE (PF) 0.9 % IJ SOLN
INTRAMUSCULAR | Status: DC | PRN
  Administered 2017-12-23: 50 mL

## 2017-12-23 MED ORDER — FENTANYL CITRATE (PF) 100 MCG/2ML IJ SOLN
INTRAMUSCULAR | Status: DC | PRN
  Administered 2017-12-23: 25 ug via INTRAVENOUS
  Administered 2017-12-23: 50 ug via INTRAVENOUS

## 2017-12-23 MED ORDER — HYDROMORPHONE HCL 1 MG/ML IJ SOLN: 0.5000 mg | INTRAMUSCULAR | Status: DC | PRN

## 2017-12-23 MED ORDER — POLYETHYLENE GLYCOL 3350 17 G PO PACK
17.0000 g | PACK | Freq: Two times a day (BID) | ORAL | Status: DC
  Filled 2017-12-23 (×2): qty 1

## 2017-12-23 MED ORDER — ONDANSETRON HCL 4 MG PO TABS: 4.0000 mg | ORAL_TABLET | Freq: Four times a day (QID) | ORAL | Status: DC | PRN

## 2017-12-23 MED ORDER — CEFUROXIME SODIUM 1.5 G IV SOLR
INTRAVENOUS | Status: DC | PRN
  Administered 2017-12-23: 1.5 g via INTRAVENOUS

## 2017-12-23 MED ORDER — FENTANYL CITRATE (PF) 100 MCG/2ML IJ SOLN: 25.0000 ug | INTRAMUSCULAR | Status: DC | PRN

## 2017-12-23 MED ORDER — FENTANYL CITRATE (PF) 250 MCG/5ML IJ SOLN
INTRAMUSCULAR | Status: AC
  Filled 2017-12-23: qty 5

## 2017-12-23 MED ORDER — POVIDONE-IODINE 7.5 % EX SOLN
Freq: Once | CUTANEOUS | Status: DC
  Filled 2017-12-23: qty 118

## 2017-12-23 MED ORDER — NATEGLINIDE 60 MG PO TABS
60.0000 mg | ORAL_TABLET | Freq: Three times a day (TID) | ORAL | Status: DC
  Administered 2017-12-24 (×2): 60 mg via ORAL
  Filled 2017-12-23 (×3): qty 1

## 2017-12-23 MED ORDER — TRANEXAMIC ACID-NACL 1000-0.7 MG/100ML-% IV SOLN
1000.0000 mg | Freq: Once | INTRAVENOUS | Status: AC
  Administered 2017-12-23: 1000 mg via INTRAVENOUS
  Filled 2017-12-23: qty 100

## 2017-12-23 MED ORDER — LACTATED RINGERS IV SOLN
INTRAVENOUS | Status: DC
  Administered 2017-12-23: 10:00:00 via INTRAVENOUS

## 2017-12-23 MED ORDER — POTASSIUM CHLORIDE IN NACL 20-0.9 MEQ/L-% IV SOLN
INTRAVENOUS | Status: DC
  Administered 2017-12-23: 21:00:00 via INTRAVENOUS
  Filled 2017-12-23: qty 1000

## 2017-12-23 MED ORDER — ONDANSETRON HCL 4 MG/2ML IJ SOLN: 4.0000 mg | Freq: Four times a day (QID) | INTRAMUSCULAR | Status: DC | PRN

## 2017-12-23 MED ORDER — METOCLOPRAMIDE HCL 5 MG PO TABS: 5.0000 mg | ORAL_TABLET | Freq: Three times a day (TID) | ORAL | Status: DC | PRN

## 2017-12-23 MED ORDER — BROMOCRIPTINE MESYLATE 2.5 MG PO TABS
2.5000 mg | ORAL_TABLET | Freq: Every day | ORAL | Status: DC
  Administered 2017-12-24: 2.5 mg via ORAL
  Filled 2017-12-23 (×2): qty 1

## 2017-12-23 MED ORDER — DIPHENHYDRAMINE HCL 12.5 MG/5ML PO ELIX: 12.5000 mg | ORAL_SOLUTION | ORAL | Status: DC | PRN

## 2017-12-23 MED ORDER — LIRAGLUTIDE 18 MG/3ML ~~LOC~~ SOPN
1.8000 mg | PEN_INJECTOR | Freq: Every day | SUBCUTANEOUS | Status: DC
  Filled 2017-12-23: qty 3

## 2017-12-23 MED ORDER — HYDROCORTISONE 1 % EX CREA
1.0000 "application " | TOPICAL_CREAM | Freq: Every day | CUTANEOUS | Status: DC | PRN
  Filled 2017-12-23: qty 28

## 2017-12-23 MED ORDER — SODIUM CHLORIDE 0.9 % IV BOLUS
500.0000 mL | Freq: Once | INTRAVENOUS | Status: AC
  Administered 2017-12-23: 500 mL via INTRAVENOUS

## 2017-12-23 MED ORDER — DOCUSATE SODIUM 100 MG PO CAPS
100.0000 mg | ORAL_CAPSULE | Freq: Two times a day (BID) | ORAL | Status: DC
  Administered 2017-12-23 – 2017-12-24 (×2): 100 mg via ORAL
  Filled 2017-12-23 (×2): qty 1

## 2017-12-23 MED ORDER — ALUM & MAG HYDROXIDE-SIMETH 200-200-20 MG/5ML PO SUSP: 30.0000 mL | ORAL | Status: DC | PRN

## 2017-12-23 MED ORDER — ACETAMINOPHEN 500 MG PO TABS
1000.0000 mg | ORAL_TABLET | Freq: Four times a day (QID) | ORAL | Status: AC
  Administered 2017-12-23 – 2017-12-24 (×3): 1000 mg via ORAL
  Filled 2017-12-23 (×3): qty 2

## 2017-12-23 MED ORDER — METOCLOPRAMIDE HCL 5 MG/ML IJ SOLN: 5.0000 mg | Freq: Three times a day (TID) | INTRAMUSCULAR | Status: DC | PRN

## 2017-12-23 MED ORDER — ONDANSETRON HCL 4 MG/2ML IJ SOLN: 4.0000 mg | Freq: Once | INTRAMUSCULAR | Status: DC | PRN

## 2017-12-23 MED ORDER — DEXAMETHASONE SODIUM PHOSPHATE 10 MG/ML IJ SOLN
INTRAMUSCULAR | Status: AC
  Filled 2017-12-23: qty 3

## 2017-12-23 MED ORDER — ROPIVACAINE HCL 5 MG/ML IJ SOLN
INTRAMUSCULAR | Status: DC | PRN
  Administered 2017-12-23: 30 mL via PERINEURAL

## 2017-12-23 MED ORDER — COLESEVELAM HCL 625 MG PO TABS
1250.0000 mg | ORAL_TABLET | Freq: Every day | ORAL | Status: DC
  Filled 2017-12-23: qty 2

## 2017-12-23 MED ORDER — SODIUM CHLORIDE 0.9 % IR SOLN
Status: DC | PRN
  Administered 2017-12-23: 3000 mL

## 2017-12-23 MED ORDER — INSULIN ASPART 100 UNIT/ML ~~LOC~~ SOLN
SUBCUTANEOUS | Status: AC
  Filled 2017-12-23: qty 1

## 2017-12-23 MED ORDER — PHENOL 1.4 % MT LIQD: 1.0000 | OROMUCOSAL | Status: DC | PRN

## 2017-12-23 MED ORDER — CEFAZOLIN SODIUM-DEXTROSE 2-4 GM/100ML-% IV SOLN
2.0000 g | INTRAVENOUS | Status: AC
  Administered 2017-12-23: 2 g via INTRAVENOUS
  Filled 2017-12-23: qty 100

## 2017-12-23 SURGICAL SUPPLY — 75 items
APL SKNCLS STERI-STRIP NONHPOA (GAUZE/BANDAGES/DRESSINGS) ×1
ATTUNE MED DOME PAT 38 KNEE (Knees) ×2 IMPLANT
ATTUNE PS FEM LT SZ 6 CEM KNEE (Femur) ×2 IMPLANT
ATTUNE PSRP INSR SZ6 8 KNEE (Insert) ×2 IMPLANT
BANDAGE ESMARK 6X9 LF (GAUZE/BANDAGES/DRESSINGS) ×1 IMPLANT
BASE TIBIAL ROT PLAT SZ 8 KNEE (Knees) ×1 IMPLANT
BENZOIN TINCTURE PRP APPL 2/3 (GAUZE/BANDAGES/DRESSINGS) ×2 IMPLANT
BLADE SAGITTAL 25.0X1.19X90 (BLADE) ×2 IMPLANT
BLADE SAW SGTL 13X75X1.27 (BLADE) ×2 IMPLANT
BLADE SURG 10 STRL SS (BLADE) ×4 IMPLANT
BNDG ELASTIC 6X15 VLCR STRL LF (GAUZE/BANDAGES/DRESSINGS) ×2 IMPLANT
BNDG ESMARK 6X9 LF (GAUZE/BANDAGES/DRESSINGS) ×2
BOWL SMART MIX CTS (DISPOSABLE) ×2 IMPLANT
CEMENT HV SMART SET (Cement) ×4 IMPLANT
CLSR STERI-STRIP ANTIMIC 1/2X4 (GAUZE/BANDAGES/DRESSINGS) ×2 IMPLANT
COVER SURGICAL LIGHT HANDLE (MISCELLANEOUS) ×2 IMPLANT
COVER WAND RF STERILE (DRAPES) ×2 IMPLANT
CUFF TOURNIQUET SINGLE 34IN LL (TOURNIQUET CUFF) ×2 IMPLANT
CUFF TOURNIQUET SINGLE 44IN (TOURNIQUET CUFF) IMPLANT
DECANTER SPIKE VIAL GLASS SM (MISCELLANEOUS) ×2 IMPLANT
DRAPE EXTREMITY T 121X128X90 (DRAPE) ×2 IMPLANT
DRAPE HALF SHEET 40X57 (DRAPES) ×4 IMPLANT
DRAPE INCISE IOBAN 66X45 STRL (DRAPES) IMPLANT
DRAPE ORTHO SPLIT 77X108 STRL (DRAPES) ×2
DRAPE SURG ORHT 6 SPLT 77X108 (DRAPES) ×1 IMPLANT
DRAPE U-SHAPE 47X51 STRL (DRAPES) ×2 IMPLANT
DRSG AQUACEL AG ADV 3.5X10 (GAUZE/BANDAGES/DRESSINGS) ×2 IMPLANT
DURAPREP 26ML APPLICATOR (WOUND CARE) ×2 IMPLANT
ELECT CAUTERY BLADE 6.4 (BLADE) ×2 IMPLANT
ELECT REM PT RETURN 9FT ADLT (ELECTROSURGICAL) ×2
ELECTRODE REM PT RTRN 9FT ADLT (ELECTROSURGICAL) ×1 IMPLANT
FACESHIELD WRAPAROUND (MASK) ×2 IMPLANT
GLOVE BIO SURGEON STRL SZ7 (GLOVE) ×2 IMPLANT
GLOVE BIOGEL PI IND STRL 7.0 (GLOVE) ×1 IMPLANT
GLOVE BIOGEL PI IND STRL 7.5 (GLOVE) ×1 IMPLANT
GLOVE BIOGEL PI INDICATOR 7.0 (GLOVE) ×1
GLOVE BIOGEL PI INDICATOR 7.5 (GLOVE) ×1
GLOVE SS BIOGEL STRL SZ 7.5 (GLOVE) ×1 IMPLANT
GLOVE SUPERSENSE BIOGEL SZ 7.5 (GLOVE) ×1
GOWN STRL REUS W/ TWL LRG LVL3 (GOWN DISPOSABLE) ×1 IMPLANT
GOWN STRL REUS W/ TWL XL LVL3 (GOWN DISPOSABLE) ×1 IMPLANT
GOWN STRL REUS W/TWL LRG LVL3 (GOWN DISPOSABLE) ×1
GOWN STRL REUS W/TWL XL LVL3 (GOWN DISPOSABLE) ×1
HANDPIECE INTERPULSE COAX TIP (DISPOSABLE) ×1
HOOD PEEL AWAY FACE SHEILD DIS (HOOD) ×4 IMPLANT
IMMOBILIZER KNEE 22 (SOFTGOODS) ×2 IMPLANT
IMMOBILIZER KNEE 22 UNIV (SOFTGOODS) ×2 IMPLANT
KIT BASIN OR (CUSTOM PROCEDURE TRAY) ×2 IMPLANT
KIT TURNOVER KIT B (KITS) ×2 IMPLANT
MANIFOLD NEPTUNE II (INSTRUMENTS) ×2 IMPLANT
MARKER SKIN DUAL TIP RULER LAB (MISCELLANEOUS) ×2 IMPLANT
NEEDLE HYPO 22GX1.5 SAFETY (NEEDLE) ×4 IMPLANT
NS IRRIG 1000ML POUR BTL (IV SOLUTION) ×2 IMPLANT
PACK TOTAL JOINT (CUSTOM PROCEDURE TRAY) ×2 IMPLANT
PAD ARMBOARD 7.5X6 YLW CONV (MISCELLANEOUS) ×4 IMPLANT
PIN STEINMAN FIXATION KNEE (PIN) ×2 IMPLANT
PIN THREADED HEADED SIGMA (PIN) ×2 IMPLANT
SET HNDPC FAN SPRY TIP SCT (DISPOSABLE) ×1 IMPLANT
STRIP CLOSURE SKIN 1/2X4 (GAUZE/BANDAGES/DRESSINGS) ×2 IMPLANT
SUCTION FRAZIER HANDLE 10FR (MISCELLANEOUS) ×1
SUCTION TUBE FRAZIER 10FR DISP (MISCELLANEOUS) ×1 IMPLANT
SUT MNCRL AB 3-0 PS2 18 (SUTURE) ×2 IMPLANT
SUT VIC AB 0 CT1 27 (SUTURE) ×2
SUT VIC AB 0 CT1 27XBRD ANBCTR (SUTURE) ×2 IMPLANT
SUT VIC AB 1 CT1 27 (SUTURE) ×2
SUT VIC AB 1 CT1 27XBRD ANBCTR (SUTURE) ×1 IMPLANT
SUT VIC AB 2-0 CT1 27 (SUTURE) ×4
SUT VIC AB 2-0 CT1 TAPERPNT 27 (SUTURE) ×2 IMPLANT
SYR CONTROL 10ML LL (SYRINGE) ×4 IMPLANT
TIBIAL BASE ROT PLAT SZ 8 KNEE (Knees) ×2 IMPLANT
TOWEL OR 17X24 6PK STRL BLUE (TOWEL DISPOSABLE) ×2 IMPLANT
TOWEL OR 17X26 10 PK STRL BLUE (TOWEL DISPOSABLE) ×2 IMPLANT
TRAY CATH 16FR W/PLASTIC CATH (SET/KITS/TRAYS/PACK) IMPLANT
TRAY FOLEY CATH SILVER 16FR (SET/KITS/TRAYS/PACK) ×2 IMPLANT
WATER STERILE IRR 1000ML POUR (IV SOLUTION) ×2 IMPLANT

## 2017-12-23 NOTE — Progress Notes (Signed)
Orthopedic Tech Progress Note Patient Details:  David CorporalByron W Maynard [birth year] [id number]  CPM Left Knee CPM Left Knee: On Left Knee Flexion (Degrees): 90 Left Knee Extension (Degrees): 0  Post Interventions Patient Tolerated: Well Instructions Provided: Care of device, Adjustment of device  Trinna PostMartinez, [name] 12/23/2017, 3:26 PM

## 2017-12-23 NOTE — Anesthesia Procedure Notes (Signed)
Anesthesia Regional Block: Adductor canal block   Pre-Anesthetic Checklist: ,, timeout performed, Correct Patient, Correct Site, Correct Laterality, Correct Procedure, Correct Position, site marked, Risks and benefits discussed,  Surgical consent,  Pre-op evaluation,  At surgeon's request and post-op pain management  Laterality: Left  Prep: chloraprep       Needles:  Injection technique: Single-shot  Needle Type: Echogenic Stimulator Needle     Needle Length: 9cm  Needle Gauge: 21     Additional Needles:   Procedures:,,,, ultrasound used (permanent image in chart),,,,  Narrative:  Start time: 12/23/2017 9:30 AM End time: 12/23/2017 9:36 AM Injection made incrementally with aspirations every 5 mL.  Performed by: Personally  Anesthesiologist: Lucretia KernWitman, Alicyn Klann E, MD  Additional Notes: Monitors applied. Injection made in 5cc increments. No resistance to injection. Good needle visualization. Patient tolerated procedure well.

## 2017-12-23 NOTE — Anesthesia Postprocedure Evaluation (Signed)
Anesthesia Post Note  Patient: David Maynard  Procedure(s) Performed: TOTAL KNEE ARTHROPLASTY (Left Knee)     Patient location during evaluation: PACU Anesthesia Type: Spinal Level of consciousness: oriented and awake and alert Pain management: pain level controlled Vital Signs Assessment: post-procedure vital signs reviewed and stable Respiratory status: spontaneous breathing, respiratory function stable and nonlabored ventilation Cardiovascular status: blood pressure returned to baseline and stable Postop Assessment: no headache, no backache, no apparent nausea or vomiting and spinal receding Anesthetic complications: no    Last Vitals:  Vitals:   12/23/17 1431 12/23/17 1446  BP: 126/72 123/70  Pulse: 69 73  Resp: (!) 21 (!) 23  Temp:    SpO2: 98% 98%    Last Pain:  Vitals:   12/23/17 1500  TempSrc:   PainSc: 0-No pain                 Lucretia Kernarolyn E Meliyah Simon

## 2017-12-23 NOTE — Anesthesia Preprocedure Evaluation (Addendum)
Anesthesia Evaluation  Patient identified by MRN, date of birth, ID band Patient awake    Reviewed: Allergy & Precautions, NPO status , Patient's Chart, lab work & pertinent test results  History of Anesthesia Complications Negative for: history of anesthetic complications  Airway Mallampati: II  TM Distance: >3 FB Neck ROM: Full    Dental no notable dental hx.    Pulmonary neg pulmonary ROS,    Pulmonary exam normal        Cardiovascular negative cardio ROS Normal cardiovascular exam     Neuro/Psych negative neurological ROS  negative psych ROS   GI/Hepatic negative GI ROS, Neg liver ROS,   Endo/Other  diabetes, Oral Hypoglycemic AgentsHypothyroidism   Renal/GU Renal disease  negative genitourinary   Musculoskeletal  (+) Arthritis , Osteoarthritis,    Abdominal   Peds  Hematology  (+) anemia ,   Anesthesia Other Findings 67 yo M for L TKA - IDDM, CKD, hypothyroid - Hgb 12.0, plts 217, INR 1.00 - TTE 10/04/14: EF 55-60%, no significant valve abnormality - EKG 10/09/17: 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 - Cleared by cardiology prior to R TKA in Oct; bifascicular block stable  Reproductive/Obstetrics                            Anesthesia Physical Anesthesia Plan  ASA: III  Anesthesia Plan: Spinal   Post-op Pain Management:    Induction:   PONV Risk Score and Plan: 1 and Propofol infusion, TIVA and Treatment may vary due to age or medical condition  Airway Management Planned: Nasal Cannula and Simple Face Mask  Additional Equipment: None  Intra-op Plan:   Post-operative Plan:   Informed Consent: I have reviewed the patients History and Physical, chart, labs and discussed the procedure including the risks, benefits and alternatives for the proposed anesthesia with the patient or authorized representative who has indicated his/her understanding and  acceptance.     Plan Discussed with:   Anesthesia Plan Comments:        Anesthesia Quick Evaluation

## 2017-12-23 NOTE — Anesthesia Procedure Notes (Signed)
Procedure Name: MAC Date/Time: 12/23/2017 11:32 AM Performed by: Julieta Bellini, CRNA Pre-anesthesia Checklist: Patient identified, Emergency Drugs available, Suction available and Patient being monitored Patient Re-evaluated:Patient Re-evaluated prior to induction Oxygen Delivery Method: Simple face mask Preoxygenation: Pre-oxygenation with 100% oxygen

## 2017-12-23 NOTE — Transfer of Care (Signed)
Immediate Anesthesia Transfer of Care Note  Patient: David Maynard  Procedure(s) Performed: TOTAL KNEE ARTHROPLASTY (Left Knee)  Patient Location: PACU  Anesthesia Type:Spinal and MAC combined with regional for post-op pain  Level of Consciousness: awake, alert , oriented and patient cooperative  Airway & Oxygen Therapy: Patient Spontanous Breathing  Post-op Assessment: Report given to RN and Post -op Vital signs reviewed and stable  Post vital signs: Reviewed and stable  Last Vitals:  Vitals Value Taken Time  BP 126/72 12/23/2017  1:31 PM  Temp    Pulse 70 12/23/2017  1:34 PM  Resp 18 12/23/2017  1:34 PM  SpO2 99 % 12/23/2017  1:34 PM  Vitals shown include unvalidated device data.  Last Pain:  Vitals:   12/23/17 0920  TempSrc: Oral  PainSc: 0-No pain      Patients Stated Pain Goal: 3 (29/92/42 6834)  Complications: No apparent anesthesia complications

## 2017-12-23 NOTE — Op Note (Signed)
MRN:     [id number] DOB/AGE:    04/29/1950 / 67 y.o.       OPERATIVE REPORT   DATE OF PROCEDURE:  12/23/2017      PREOPERATIVE DIAGNOSIS:   Primary Localized Osteoarthritis left Knee       Estimated body mass index is 20.15 kg/m as calculated from the following:   Height as of this encounter: 5' 7.5" (1.715 m).   Weight as of this encounter: 59.2 kg.                                                       POSTOPERATIVE DIAGNOSIS:   Same                                                                 PROCEDURE:  Procedure(s): TOTAL KNEE ARTHROPLASTY Using Depuy Attune RP implants #6 Femur, #7Tibia, 8mm  RP bearing, 38 Patella    SURGEON: Carrell Rahmani A. Thurston Hole, MD   ASSISTANT: Julien Girt, PA-C, present and scrubbed throughout the case, critical for retraction, instrumentation, and closure.  ANESTHESIA: Spinal with Adductor Nerve Block  TOURNIQUET TIME: 55 minutes   COMPLICATIONS:  None       SPECIMENS: None   INDICATIONS FOR PROCEDURE: The patient has djd of the knee with varus deformities, XR shows bone on bone arthritis. Patient has failed all conservative measures including anti-inflammatory medicines, narcotics, attempts at exercise and weight loss, cortisone injections and viscosupplementation.  Risks and benefits of surgery have been discussed, questions answered.    DESCRIPTION OF PROCEDURE: The patient identified by armband, received right adductor canal block and IV antibiotics, in the holding area at [facility]. Patient taken to the operating room, appropriate anesthetic monitors were attached. Spinal anesthesia induced with the patient in supine position, Foley catheter was inserted. Tourniquet applied high to the operative thigh. Lateral post and foot positioner applied to the table, the lower extremity was then prepped and draped in usual sterile fashion from the ankle to the tourniquet. Time-out procedure was performed. The limb was wrapped with an Esmarch  bandage and the tourniquet inflated to 365 mmHg.   We began the operation by making a 6cm anterior midline incision. Small bleeders in the skin and the subcutaneous tissue identified and cauterized. Transverse retinaculum was incised and reflected medially and a medial parapatellar arthrotomy was accomplished. the patella was everted and theprepatellar fat pad resected. The superficial medial collateral ligament was then elevated from anterior to posterior along the proximal flare of the tibia and anterior half of the menisci resected. The knee was hyperflexed exposing bone on bone arthritis. Peripheral and notch osteophytes as well as the cruciate ligaments were then resected. We continued to work our way around posteriorly along the proximal tibia, and externally rotated the tibia subluxing it out from underneath the femur. A McHale retractor was placed through the notch and a lateral Hohmann retractor placed, and an external tibial guide was placed.  The tibial cutting guide was pinned into place allowing resection of 6 mm of bone medially and about 4 mm of bone laterally because of her  valgus deformity.   Satisfied with the tibial resection, we then entered the distal femur 2 mm anterior to the PCL origin with the intramedullary guide rod and applied the distal femoral cutting guide set at 11mm, with 5 degrees of valgus. This was pinned along the epicondylar axis. At this point, the distal femoral cut was accomplished without difficulty. We then sized for a 6 femoral component and pinned the guide in 3 degrees of external rotation.The chamfer cutting guide was pinned into place. The anterior, posterior, and chamfer cuts were accomplished without difficulty followed by the  RP box cutting guide and the box cut. We also removed posterior osteophytes from the posterior femoral condyles. At this time, the knee was brought into full extension. We checked our extension and flexion gaps and found them symmetric at  8.  The patella thickness measured at 5151m m. We set the cutting guide at 15 and removed the posterior patella sized for 38 button and drilled the lollipop. The knee was then once again hyperflexed exposing the proximal tibia. We sized for a # 7 tibial base plate, applied the smokestack and the conical reamer followed by the the Delta fin keel punch. We then hammered into place the  RP trial femoral component, inserted a trial bearing, trial patellar button, and took the knee through range of motion from 0-130 degrees. No thumb pressure was required for patellar tracking.   At this point, all trial components were removed, a double batch of DePuy HV cement with Zinecef was mixed and applied to all bony metallic mating surfaces. In order, we hammered into place the tibial tray and removed excess cement, the femoral component and removed excess cement, a 8 mm  RP bearing was inserted, and the knee brought to full extension with compression. The patellar button was clamped into place, and excess cement removed. While the cement cured the wound was irrigated out with normal saline solution pulse lavage, and exparel was injected throughout the knee. Ligament stability and patellar tracking were checked and found to be excellent..   The parapatellar arthrotomy was closed with  #1 Vicryl suture. The subcutaneous tissue with 0 and 2-0 undyed Vicryl suture, and 4-0 Monocryl.. A dressing of Aquaseal, 4 x 4, dressing sponges, Webril, and Ace wrap applied. Needle and sponge count were correct times 2.The patient awakened, extubated, and taken to recovery room without difficulty. Vascular status was normal, pulses 2+ and symmetric.    Nilda Simmerobert A Rayel Santizo 04/15/2017, 8:56 AM

## 2017-12-23 NOTE — Interval H&P Note (Signed)
History and Physical Interval Note:  12/23/2017 11:01 AM  Marlyn CorporalByron W Oberman  has presented today for surgery, with the diagnosis of djd left knee  The various methods of treatment have been discussed with the patient and family. After consideration of risks, benefits and other options for treatment, the patient has consented to  Procedure(s): TOTAL KNEE ARTHROPLASTY (Left) as a surgical intervention .  The patient's history has been reviewed, patient examined, no change in status, stable for surgery.  I have reviewed the patient's chart and labs.  Questions were answered to the patient's satisfaction.     Nilda Simmerobert A Dameian Crisman

## 2017-12-24 ENCOUNTER — Other Ambulatory Visit: Payer: Self-pay

## 2017-12-24 ENCOUNTER — Encounter (HOSPITAL_COMMUNITY): Payer: Self-pay | Admitting: General Practice

## 2017-12-24 LAB — GLUCOSE, CAPILLARY
Glucose-Capillary: 189 mg/dL — ABNORMAL HIGH (ref 70–99)
Glucose-Capillary: 260 mg/dL — ABNORMAL HIGH (ref 70–99)

## 2017-12-24 LAB — BASIC METABOLIC PANEL
Anion gap: 9 (ref 5–15)
BUN: 14 mg/dL (ref 8–23)
CO2: 25 mmol/L (ref 22–32)
Calcium: 8.5 mg/dL — ABNORMAL LOW (ref 8.9–10.3)
Chloride: 99 mmol/L (ref 98–111)
Creatinine, Ser: 0.98 mg/dL (ref 0.61–1.24)
GFR calc non Af Amer: >60 mL/min (ref >60)
Glucose, Bld: 211 mg/dL — ABNORMAL HIGH (ref 70–99)
Potassium: 4 mmol/L (ref 3.5–5.1)
Sodium: 133 mmol/L — ABNORMAL LOW (ref 135–145)

## 2017-12-24 LAB — CBC
HCT: 31.7 % — ABNORMAL LOW (ref 39.0–52.0)
HEMOGLOBIN: 10.2 g/dL — AB (ref 13.0–17.0)
MCH: 29.6 pg (ref 26.0–34.0)
MCHC: 32.2 g/dL (ref 30.0–36.0)
MCV: 91.9 fL (ref 80.0–100.0)
Platelets: 240 10*3/uL (ref 150–400)
RBC: 3.45 MIL/uL — ABNORMAL LOW (ref 4.22–5.81)
RDW: 13.5 % (ref 11.5–15.5)
WBC: 14.8 10*3/uL — AB (ref 4.0–10.5)
nRBC: 0 % (ref 0.0–0.2)

## 2017-12-24 MED ORDER — OXYCODONE HCL 5 MG PO TABS: ORAL_TABLET | ORAL | 0 refills | Status: DC

## 2017-12-24 MED ORDER — POLYETHYLENE GLYCOL 3350 17 G PO PACK: PACK | ORAL | 0 refills | Status: DC

## 2017-12-24 MED ORDER — PROMETHAZINE HCL 12.5 MG PO TABS: 12.5000 mg | ORAL_TABLET | Freq: Four times a day (QID) | ORAL | 0 refills | Status: DC | PRN

## 2017-12-24 MED ORDER — GABAPENTIN 300 MG PO CAPS: ORAL_CAPSULE | ORAL | 0 refills | Status: DC

## 2017-12-24 MED ORDER — DOCUSATE SODIUM 100 MG PO CAPS: ORAL_CAPSULE | ORAL | 0 refills | Status: DC

## 2017-12-24 NOTE — Plan of Care (Signed)

## 2017-12-24 NOTE — Care Management Note (Signed)
Case Management Note  Patient Details  Name: David CorporalByron W Maynard MRN: [id number] Date of Birth: 1951/01/09  Subjective/Objective:   67 yr old gentleman s/pleft total knee arthroplasty.                 Action/Plan: Patient was preoperatively setup with [facility], no changes. DME has been delivered to his home. Patient will have family support at discharge.    Expected Discharge Date:    12/24/17              Expected Discharge Plan:  Home w Home Health Services  In-House Referral:  NA  Discharge planning Services  CM Consult  Post Acute Care Choice:  Durable Medical Equipment, Home Health Choice offered to:  Patient  DME Arranged:  3-N-1, Walker rolling, CPM DME Agency:  [facility]  HH Arranged:  PT HH Agency:  [facility] (formerly [facility])  Status of Service:  Completed, signed off  If discussed at [employer] Meetings, dates discussed:    Additional Comments:  Durenda GuthrieBrady, [name], RN 12/24/2017, 12:38 PM

## 2017-12-24 NOTE — Progress Notes (Signed)
Patient discharged with out patient therapy.  He is not doing HH.  Patient vitals WNL and reported pain at a 1.  Instructions and belongings sent with patient.

## 2017-12-24 NOTE — Discharge Summary (Signed)
Patient ID: David Maynard MRN: [id number] DOB/AGE: June 29, 1950 67 y.o.  Admit date: 12/23/2017 Discharge date: 12/24/2017  Admission Diagnoses:  Principal Problem:   Primary localized osteoarthritis of left knee Active Problems:   Hypothyroidism   Diabetes ([place])   Depression   Diabetes mellitus, type II ([place])   Anemia   S/P total knee arthroplasty, right   Discharge Diagnoses:  Same  Past Medical History:  Diagnosis Date  . Anemia    history of anemia  . Depressive disorder, not elsewhere classified    none  . Diabetes mellitus type II   . Hypercholesterolemia   . Hypogonadism male   . Primary localized osteoarthritis of left knee 12/11/2017  . Primary localized osteoarthritis of right knee 10/23/2017  . 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 Thyroid disease   . Unspecified hypothyroidism     Surgeries: Procedure(s): TOTAL KNEE ARTHROPLASTY on 12/23/2017   Consultants:   Discharged Condition: Improved  Hospital Course: David Maynard is an 67 y.o. male who was admitted 12/23/2017 for operative treatment ofPrimary localized osteoarthritis of left knee. Patient has severe unremitting pain that affects sleep, daily activities, and work/hobbies. After pre-op clearance the patient was taken to the operating room on 12/23/2017 and underwent  Procedure(s): TOTAL KNEE ARTHROPLASTY.    Patient was given perioperative antibiotics:  Anti-infectives (From admission, onward)   Start     Dose/Rate Route Frequency Ordered Stop   12/23/17 2200  ceFAZolin (ANCEF) IVPB 2g/100 mL premix     2 g 200 mL/hr over 30 Minutes Intravenous Every 6 hours 12/23/17 2027 12/24/17 0704   12/23/17 1243  cefUROXime (ZINACEF) injection  Status:  Discontinued       As needed 12/23/17 1244 12/23/17 1405   12/23/17 0745  ceFAZolin (ANCEF) IVPB 2g/100 mL premix     2 g 200 mL/hr over 30 Minutes Intravenous On call to O.R. 12/23/17 0741 12/23/17 1129       Patient was given sequential  compression devices, early ambulation, and chemoprophylaxis to prevent DVT.  Patient benefited maximally from hospital stay and there were no complications.    Recent vital signs:  Patient Vitals for the past 24 hrs:  BP Temp Temp src Pulse Resp SpO2  12/24/17 0800 115/63 98.2 F (36.8 C) Oral 79 20 97 %  12/24/17 0440 117/68 98.3 F (36.8 C) Oral 80 20 97 %  12/24/17 0108 116/70 98.5 F (36.9 C) Oral 88 20 95 %  12/23/17 2048 116/67 98.7 F (37.1 C) Oral 93 18 97 %  12/23/17 2001 122/68 - - 96 (!) 23 98 %  12/23/17 2000 - 97.7 F (36.5 C) - - - -  12/23/17 1931 116/65 - - 94 (!) 21 98 %  12/23/17 1901 120/61 - - 93 (!) 24 98 %  12/23/17 1832 122/72 - - 88 18 98 %  12/23/17 1801 121/64 - - 84 (!) 21 98 %  12/23/17 1731 121/68 - - 86 19 98 %  12/23/17 1701 122/68 - - 81 16 98 %  12/23/17 1700 - 97.7 F (36.5 C) - - - -  12/23/17 1646 122/68 - - 91 19 99 %  12/23/17 1631 122/63 - - 79 20 98 %  12/23/17 1616 127/75 - - 86 19 99 %  12/23/17 1601 129/68 - - 83 16 99 %  12/23/17 1546 129/73 - - 77 15 97 %  12/23/17 1531 116/69 - - 82 16 99 %  12/23/17 1516 125/69 - -  76 18 97 %  12/23/17 1501 123/71 - - 79 (!) 21 97 %  12/23/17 1446 123/70 - - 73 (!) 23 98 %  12/23/17 1431 126/72 - - 69 (!) 21 98 %  12/23/17 1416 128/71 - - 73 20 98 %  12/23/17 1401 128/69 - - 70 (!) 21 98 %  12/23/17 1346 129/69 - - 69 (!) 21 97 %  12/23/17 1330 126/72 97.6 F (36.4 C) - 72 14 100 %     Recent laboratory studies:  Recent Labs    12/24/17 0353  WBC 14.8*  HGB 10.2*  HCT 31.7*  PLT 240  NA 133*  K 4.0  CL 99  CO2 25  BUN 14  CREATININE 0.98  GLUCOSE 211*  CALCIUM 8.5*     Discharge Medications:   Allergies as of 12/24/2017   No Known Allergies     Medication List    TAKE these medications   ACCU-CHEK NANO SMARTVIEW w/Device Kit Use to check blood sugar 6 times per day.   acetaminophen 500 MG tablet Commonly known as:  TYLENOL Take 2 tablets (1,000 mg total) by mouth  every 6 (six) hours.   aspirin 325 MG EC tablet Take 1 tablet (325 mg total) by mouth daily with breakfast.   bromocriptine 2.5 MG tablet Commonly known as:  PARLODEL Take 1 tablet (2.5 mg total) by mouth daily.   clomiPHENE 50 MG tablet Commonly known as:  CLOMID TAKE ONE-HALF TABLET BY  MOUTH DAILY   colesevelam 625 MG tablet Commonly known as:  WELCHOL TAKE 2 TABLETS BY MOUTH  DAILY What changed:  when to take this   docusate sodium 100 MG capsule Commonly known as:  COLACE 1 tab 2 times a day while on narcotics.  STOOL SOFTENER What changed:    how much to take  how to take this  when to take this  additional instructions   EASY TOUCH PEN NEEDLES 32G X 6 MM Misc Generic drug:  Insulin Pen Needle USE ONCE DAILY IN THE  MORNING   gabapentin 300 MG capsule Commonly known as:  NEURONTIN 1 po q hs for nerve pain What changed:    how much to take  how to take this  when to take this  additional instructions   hydrocortisone cream 1 % Apply 1 application topically daily as needed for itching.   levothyroxine 50 MCG tablet Commonly known as:  SYNTHROID, LEVOTHROID TAKE 1 TABLET BY MOUTH  DAILY BEFORE BREAKFAST What changed:    how much to take  how to take this  when to take this  additional instructions   liraglutide 18 MG/3ML Sopn Commonly known as:  [place]et [place] 1.'8MG'$  EVERY MORNING What changed:    how much to take  how to take this  when to take this  additional instructions   metFORMIN 500 MG 24 hr tablet Commonly known as:  GLUCOPHAGE-XR TAKE 2 TABLETS BY MOUTH TWO TIMES DAILY What changed:    how much to take  how to take this  when to take this  additional instructions   MYRBETRIQ 25 MG Tb24 tablet Generic drug:  mirabegron ER Take 25 mg by mouth 2 (two) times daily.   nateglinide 60 MG tablet Commonly known as:  STARLIX Take 1 tablet (60 mg total) by mouth 3 (three) times daily with meals.    oxyCODONE 5 MG immediate release tablet Commonly known as:  Oxy IR/ROXICODONE 1 po q 4 hrs prn pain.  Patient had a total knee replacement on 12/23/2017   pioglitazone 45 MG tablet Commonly known as:  ACTOS Take 1 tablet (45 mg total) by mouth daily.   polyethylene glycol packet Commonly known as:  MIRALAX / GLYCOLAX 17grams in 6 oz of something to drink twice a day until bowel movement.  LAXITIVE.  Restart if two days since last bowel movement What changed:    how much to take  how to take this  when to take this  additional instructions   promethazine 12.5 MG tablet Commonly known as:  PHENERGAN Take 1 tablet (12.5 mg total) by mouth every 6 (six) hours as needed for nausea or vomiting.   simvastatin 20 MG tablet Commonly known as:  ZOCOR TAKE 1 TABLET BY MOUTH AT  BEDTIME What changed:  how much to take   tadalafil 20 MG tablet Commonly known as:  ADCIRCA/CIALIS Take 20 mg by mouth daily as needed for erectile dysfunction.            Discharge Care Instructions  (From admission, onward)         Start     Ordered   12/24/17 0000  Change dressing    Comments:  DO NOT REMOVE BANDAGE OVER SURGICAL INCISION.  [place] WHOLE LEG INCLUDING OVER THE WATERPROOF BANDAGE WITH SOAP AND WATER EVERY DAY.   12/24/17 1307          Diagnostic Studies: No results found.  Disposition: Discharge disposition: 01-Home or Self Care       Discharge Instructions    CPM   Complete by:  As directed    Continuous passive motion machine (CPM):      Use the CPM from 0 to 90 for 6 hours per day.       You may break it up into 2 or 3 sessions per day.      Use CPM for 2 weeks or until you are told to stop.   Call MD / Call 911   Complete by:  As directed    If you experience chest pain or shortness of breath, CALL 911 and be transported to the hospital emergency room.  If you develope a fever above 101 F, pus (white drainage) or increased drainage or redness at the wound, or  calf pain, call your surgeon's office.   Change dressing   Complete by:  As directed    DO NOT REMOVE BANDAGE OVER SURGICAL INCISION.  [place] WHOLE LEG INCLUDING OVER THE WATERPROOF BANDAGE WITH SOAP AND WATER EVERY DAY.   Constipation Prevention   Complete by:  As directed    Drink plenty of fluids.  Prune juice may be helpful.  You may use a stool softener, such as Colace (over the counter) 100 mg twice a day.  Use MiraLax (over the counter) for constipation as needed.   Diet - low sodium heart healthy   Complete by:  As directed    Discharge instructions   Complete by:  As directed    INSTRUCTIONS AFTER JOINT REPLACEMENT   Remove items at home which could result in a fall. This includes throw rugs or furniture in walking pathways ICE to the affected joint every three hours while awake for 30 minutes at a time, for at least the first 3-5 days, and then as needed for pain and swelling.  Continue to use ice for pain and swelling. You may notice swelling that will progress down to the foot and ankle.  This is normal after  surgery.  Elevate your leg when you are not up walking on it.   Continue to use the breathing machine you got in the hospital (incentive spirometer) which will help keep your temperature down.  It is common for your temperature to cycle up and down following surgery, especially at night when you are not up moving around and exerting yourself.  The breathing machine keeps your lungs expanded and your temperature down.   DIET:  As you were doing prior to hospitalization, we recommend a well-balanced diet.  DRESSING / WOUND CARE / SHOWERING  Keep the surgical dressing until follow up.  The dressing is water proof, so you can shower without any extra covering.  IF THE DRESSING FALLS OFF or the wound gets wet inside, change the dressing with sterile gauze.  Please use good hand washing techniques before changing the dressing.  Do not use any lotions or creams on the incision until  instructed by your surgeon.    ACTIVITY  Increase activity slowly as tolerated, but follow the weight bearing instructions below.   No driving for 6 weeks or until further direction given by your physician.  You cannot drive while taking narcotics.  No lifting or carrying greater than 10 lbs. until further directed by your surgeon. Avoid periods of inactivity such as sitting longer than an hour when not asleep. This helps prevent blood clots.  You may return to work once you are authorized by your doctor.     WEIGHT BEARING   Weight bearing as tolerated with assist device (walker, cane, etc) as directed, use it as long as suggested by your surgeon or therapist, typically at least 2-3 weeks.   EXERCISES  Results after joint replacement surgery are often greatly improved when you follow the exercise, range of motion and muscle strengthening exercises prescribed by your doctor. Safety measures are also important to protect the joint from further injury. Any time any of these exercises cause you to have increased pain or swelling, decrease what you are doing until you are comfortable again and then slowly increase them. If you have problems or questions, call your caregiver or physical therapist for advice.   Rehabilitation is important following a joint replacement. After just a few days of immobilization, the muscles of the leg can become weakened and shrink (atrophy).  These exercises are designed to build up the tone and strength of the thigh and leg muscles and to improve motion. Often times heat used for twenty to thirty minutes before working out will loosen up your tissues and help with improving the range of motion but do not use heat for the first two weeks following surgery (sometimes heat can increase post-operative swelling).   These exercises can be done on a training (exercise) mat, on the floor, on a table or on a bed. Use whatever works the best and is most comfortable for you.     Use music or television while you are exercising so that the exercises are a pleasant break in your day. This will make your life better with the exercises acting as a break in your routine that you can look forward to.   Perform all exercises about fifteen times, three times per day or as directed.  You should exercise both the operative leg and the other leg as well.   Exercises include:  Quad Sets - Tighten up the muscle on the front of the thigh (Quad) and hold for 5-10 seconds.   Straight Leg Raises -  With your knee straight (if you were given a brace, keep it on), lift the leg to 60 degrees, hold for 3 seconds, and slowly lower the leg.  Perform this exercise against resistance later as your leg gets stronger.  Leg Slides: Lying on your back, slowly slide your foot toward your buttocks, bending your knee up off the floor (only go as far as is comfortable). Then slowly slide your foot back down until your leg is flat on the floor again.  Angel Wings: Lying on your back spread your legs to the side as far apart as you can without causing discomfort.  Hamstring Strength:  Lying on your back, push your heel against the floor with your leg straight by tightening up the muscles of your buttocks.  Repeat, but this time bend your knee to a comfortable angle, and push your heel against the floor.  You may put a pillow under the heel to make it more comfortable if necessary.   A rehabilitation program following joint replacement surgery can speed recovery and prevent re-injury in the future due to weakened muscles. Contact your doctor or a physical therapist for more information on knee rehabilitation.    CONSTIPATION  Constipation is defined medically as fewer than three stools per week and severe constipation as less than one stool per week.  Even if you have a regular bowel pattern at home, your normal regimen is likely to be disrupted due to multiple reasons following surgery.  Combination of  anesthesia, postoperative narcotics, change in appetite and fluid intake all can affect your bowels.   YOU MUST use at least one of the following options; they are listed in order of increasing strength to get the job done.  They are all available over the counter, and you may need to use some, POSSIBLY even all of these options:    Drink plenty of fluids (prune juice may be helpful) and high fiber foods Colace 100 mg by mouth twice a day  Senokot for constipation as directed and as needed Dulcolax (bisacodyl), take with full glass of water  Miralax (polyethylene glycol) once or twice a day as needed.  If you have tried all these things and are unable to have a bowel movement in the first 3-4 days after surgery call either your surgeon or your primary doctor.    If you experience loose stools or diarrhea, hold the medications until you stool forms back up.  If your symptoms do not get better within 1 week or if they get worse, check with your doctor.  If you experience "the worst abdominal pain ever" or develop nausea or vomiting, please contact the office immediately for further recommendations for treatment.   ITCHING:  If you experience itching with your medications, try taking only a single pain pill, or even half a pain pill at a time.  You can also use Benadryl over the counter for itching or also to help with sleep.   TED HOSE STOCKINGS:  Use stockings on both legs until for at least 2 weeks or as directed by physician office. They may be removed at night for sleeping.  MEDICATIONS:  See your medication summary on the "After Visit Summary" that nursing will review with you.  You may have some home medications which will be placed on hold until you complete the course of blood thinner medication.  It is important for you to complete the blood thinner medication as prescribed.  PRECAUTIONS:  If you experience chest pain  or shortness of breath - call 911 immediately for transfer to the  hospital emergency department.   If you develop a fever greater that 101 F, purulent drainage from wound, increased redness or drainage from wound, foul odor from the wound/dressing, or calf pain - CONTACT YOUR SURGEON.                                                   FOLLOW-UP APPOINTMENTS:  If you do not already have a post-op appointment, please call the office for an appointment to be seen by your surgeon.  Guidelines for how soon to be seen are listed in your "After Visit Summary", but are typically between 1-4 weeks after surgery.  OTHER INSTRUCTIONS:   Knee Replacement:  Do not place pillow under knee, focus on keeping the knee straight while resting. CPM instructions: 0-90 degrees, 2 hours in the morning, 2 hours in the afternoon, and 2 hours in the evening. Place foam block, curve side up under heel at all times except when in CPM or when walking.  DO NOT modify, tear, cut, or change the foam block in any way.  MAKE SURE YOU:  Understand these instructions.  Get help right away if you are not doing well or get worse.    Thank you for letting us be a part of your medical care team.  It is a privilege we respect greatly.  We hope these instructions will help you stay on track for a fast and full recovery!   Do not put a pillow under the knee. Place it under the heel.   Complete by:  As directed    Place gray foam block, curve side up under heel at all times except when in CPM or when walking.  DO NOT modify, tear, cut, or change in any way the gray foam block.   Increase activity slowly as tolerated   Complete by:  As directed    Patient may shower   Complete by:  As directed    Aquacel dressing is water proof    Wash over it and the whole leg with soap and water at the end of your shower   TED hose   Complete by:  As directed    Use stockings (TED hose) for 2 weeks on both leg(s).  You may remove them at night for sleeping.      Follow-up Information    [facility] Follow  up.   Specialty:  [place] Why:  A representative from [facility] will contact you to arrange start date and time for your therapy. Contact information: [address] [place] [address] [phone]        [facility] Follow up on 12/26/2017.   Specialty:  Orthopedic Surgery Why:  Physical therapy appointment at 10 arrive 9:45 Contact information: [place] [address] [phone]        Elsie Saas, MD Follow up on 01/07/2018.   Specialty:  Orthopedic Surgery Why:  appointment 10 am Contact information: [place] [address] [phone]            Signed: Linda Hedges 12/24/2017, 1:15 PM

## 2017-12-24 NOTE — Progress Notes (Signed)
Physical Therapy Treatment Patient Details Name: David Maynard MRN: [id number] DOB: 03/14/1950 Today's Date: 12/24/2017    History of Present Illness 67yo male with received L TKA on 12/23/17, also with recent history of R TKA on 11/04/17. PMH DM, R TKA, hypothyroidism, hx L knee scope and L ACL repair, RCR     PT Comments    Patient received in bed, increased pain but willing to work with therapy this afternoon and very pleasant. Verbally reviewed and practiced total knee HEP with patient able to give correct demonstration, wife present and observed all exercises today. Otherwise continued working on gait training with significant improvement noted in gait mechanics, cues only for upright posture during gait period this afternoon. He was left sitting at EOB to use urinal per his request with wife immediately present, all other needs met and questions/concerns addressed.     Follow Up Recommendations  Follow surgeon's recommendation for DC plan and follow-up therapies     Equipment Recommendations  None recommended by PT(has all necessary DME )    Recommendations for Other Services       Precautions / Restrictions Precautions Precautions: Fall Restrictions Weight Bearing Restrictions: Yes LLE Weight Bearing: Weight bearing as tolerated    Mobility  Bed Mobility Overal bed mobility: Needs Assistance Bed Mobility: Supine to Sit     Supine to sit: Min guard     General bed mobility comments: Min guard to support L LE during transfer   Transfers Overall transfer level: Needs assistance Equipment used: Rolling walker (2 wheeled) Transfers: Sit to/from Stand Sit to Stand: Supervision         General transfer comment: S for safety and occasional VC for safety, no physical assist given   Ambulation/Gait Ambulation/Gait assistance: Supervision Gait Distance (Feet): 250 Feet Assistive device: Rolling walker (2 wheeled) Gait Pattern/deviations: Step-through  pattern;Decreased step length - right;Decreased stance time - left;Decreased dorsiflexion - left;Decreased dorsiflexion - right;Decreased weight shift to left;Trunk flexed Gait velocity: decreased    General Gait Details: cues for upright posture, gait pattern otherwise significantly improved this afternoon    Stairs             Wheelchair Mobility    Modified Rankin (Stroke Patients Only)       Balance Overall balance assessment: Mild deficits observed, not formally tested                                          Cognition Arousal/Alertness: Awake/alert Behavior During Therapy: WFL for tasks assessed/performed Overall Cognitive Status: Within Functional Limits for tasks assessed                                        Exercises Total Joint Exercises Goniometric ROM: L knee AROM in supine: extension 12 degrees, flexion 90 degrees     General Comments        Pertinent Vitals/Pain Pain Assessment: Faces Faces Pain Scale: Hurts even more Pain Location: L knee  Pain Descriptors / Indicators: Aching;Sore Pain Intervention(s): Limited activity within patient's tolerance;Monitored during session;Repositioned    Home Living                      Prior Function            PT Goals (  current goals can now be found in the care plan section) Acute Rehab PT Goals Patient Stated Goal: go home, start HHPT  PT Goal Formulation: With patient/family Time For Goal Achievement: 01/07/18 Potential to Achieve Goals: Good Progress towards PT goals: Progressing toward goals    Frequency    7X/week      PT Plan Current plan remains appropriate    Co-evaluation              AM-PAC PT "6 Clicks" Mobility   Outcome Measure  Help needed turning from your back to your side while in a flat bed without using bedrails?: None Help needed moving from lying on your back to sitting on the side of a flat bed without using bedrails?:  None Help needed moving to and from a bed to a chair (including a wheelchair)?: A Little Help needed standing up from a chair using your arms (e.g., wheelchair or bedside chair)?: A Little Help needed to walk in hospital room?: A Little Help needed climbing 3-5 steps with a railing? : A Little 6 Click Score: 20    End of Session   Activity Tolerance: Patient tolerated treatment well Patient left: in bed;with call bell/phone within reach;with family/visitor present(sitting at EOB using urinal per his request, spouse immediately present ) Nurse Communication: Mobility status PT Visit Diagnosis: Difficulty in walking, not elsewhere classified (R26.2);Muscle weakness (generalized) (M62.81);Unsteadiness on feet (R26.81)     Time: 2694-8546 PT Time Calculation (min) (ACUTE ONLY): 23 min  Charges:  $Gait Training: 8-22 mins $Therapeutic Exercise: 8-22 mins  Deniece Ree PT, DPT, CBIS  Supplemental Physical Therapist [place]    Pager [phone] Acute Rehab Office [phone]

## 2017-12-24 NOTE — Plan of Care (Signed)
Problem: Education: Goal: Knowledge of General Education information will improve Description Including pain rating scale, medication(s)/side effects and non-pharmacologic comfort measures 12/24/2017 1558 by Coy Saunas, RN Outcome: Adequate for Discharge 12/24/2017 0731 by Coy Saunas, RN Outcome: Progressing   Problem: Health Behavior/Discharge Planning: Goal: Ability to manage health-related needs will improve 12/24/2017 1558 by Coy Saunas, RN Outcome: Adequate for Discharge 12/24/2017 0731 by Coy Saunas, RN Outcome: Progressing   Problem: Clinical Measurements: Goal: Ability to maintain clinical measurements within normal limits will improve 12/24/2017 1558 by Coy Saunas, RN Outcome: Adequate for Discharge 12/24/2017 0731 by Coy Saunas, RN Outcome: Progressing Goal: Will remain free from infection 12/24/2017 1558 by Coy Saunas, RN Outcome: Adequate for Discharge 12/24/2017 0731 by Coy Saunas, RN Outcome: Progressing Goal: Diagnostic test results will improve 12/24/2017 1558 by Coy Saunas, RN Outcome: Adequate for Discharge 12/24/2017 0731 by Coy Saunas, RN Outcome: Progressing Goal: Respiratory complications will improve 12/24/2017 1558 by Coy Saunas, RN Outcome: Adequate for Discharge 12/24/2017 0731 by Coy Saunas, RN Outcome: Progressing Goal: Cardiovascular complication will be avoided 12/24/2017 1558 by Coy Saunas, RN Outcome: Adequate for Discharge 12/24/2017 0731 by Coy Saunas, RN Outcome: Progressing   Problem: Activity: Goal: Risk for activity intolerance will decrease 12/24/2017 1558 by Coy Saunas, RN Outcome: Adequate for Discharge 12/24/2017 0731 by Coy Saunas, RN Outcome: Progressing   Problem: Nutrition: Goal: Adequate nutrition will be maintained 12/24/2017 1558 by Coy Saunas, RN Outcome: Adequate for Discharge 12/24/2017 0731 by Coy Saunas, RN Outcome: Progressing   Problem:  Coping: Goal: Level of anxiety will decrease 12/24/2017 1558 by Coy Saunas, RN Outcome: Adequate for Discharge 12/24/2017 0731 by Coy Saunas, RN Outcome: Progressing   Problem: Elimination: Goal: Will not experience complications related to bowel motility 12/24/2017 1558 by Coy Saunas, RN Outcome: Adequate for Discharge 12/24/2017 0731 by Coy Saunas, RN Outcome: Progressing Goal: Will not experience complications related to urinary retention 12/24/2017 1558 by Coy Saunas, RN Outcome: Adequate for Discharge 12/24/2017 0731 by Coy Saunas, RN Outcome: Progressing   Problem: Pain Managment: Goal: General experience of comfort will improve 12/24/2017 1558 by Coy Saunas, RN Outcome: Adequate for Discharge 12/24/2017 0731 by Coy Saunas, RN Outcome: Progressing   Problem: Safety: Goal: Ability to remain free from injury will improve 12/24/2017 1558 by Coy Saunas, RN Outcome: Adequate for Discharge 12/24/2017 0731 by Coy Saunas, RN Outcome: Progressing   Problem: Skin Integrity: Goal: Risk for impaired skin integrity will decrease 12/24/2017 1558 by Coy Saunas, RN Outcome: Adequate for Discharge 12/24/2017 0731 by Coy Saunas, RN Outcome: Progressing   Problem: Education: Goal: Knowledge of the prescribed therapeutic regimen will improve 12/24/2017 1558 by Coy Saunas, RN Outcome: Adequate for Discharge 12/24/2017 0731 by Coy Saunas, RN Outcome: Progressing Goal: Individualized Educational Video(s) 12/24/2017 1558 by Coy Saunas, RN Outcome: Adequate for Discharge 12/24/2017 0731 by Coy Saunas, RN Outcome: Progressing   Problem: Activity: Goal: Ability to avoid complications of mobility impairment will improve 12/24/2017 1558 by Coy Saunas, RN Outcome: Adequate for Discharge 12/24/2017 0731 by Coy Saunas, RN Outcome: Progressing Goal: Range of joint motion will improve 12/24/2017 1558 by Coy Saunas, RN Outcome:  Adequate for Discharge 12/24/2017 0731 by Coy Saunas, RN Outcome: Progressing   Problem: Clinical Measurements: Goal: Postoperative complications will be avoided or minimized 12/24/2017 1558  by Coy SaunasBrown, Javani Spratt A, RN Outcome: Adequate for Discharge 12/24/2017 0731 by Coy SaunasBrown, Adelia Baptista A, RN Outcome: Progressing   Problem: Pain Management: Goal: Pain level will decrease with appropriate interventions 12/24/2017 1558 by Coy SaunasBrown, Shenae Bonanno A, RN Outcome: Adequate for Discharge 12/24/2017 0731 by Coy SaunasBrown, Shanika Levings A, RN Outcome: Progressing   Problem: Skin Integrity: Goal: Will show signs of wound healing 12/24/2017 1558 by Coy SaunasBrown, Zaya Kessenich A, RN Outcome: Adequate for Discharge 12/24/2017 0731 by Coy SaunasBrown, Joeli Fenner A, RN Outcome: Progressing

## 2017-12-24 NOTE — Evaluation (Signed)
Physical Therapy Evaluation Patient Details Name: David Maynard MRN: [id number] DOB: 1950/11/26 Today's Date: 12/24/2017   History of Present Illness  67yo male with received L TKA on 12/23/17, also with recent history of R TKA on 11/04/17. PMH DM, R TKA, hypothyroidism, hx L knee scope and L ACL repair, RCR   Clinical Impression  Patient received in bed, family present and observed session, very pleasant and motivated to participate in therapy today. L knee AROM in supine: extension 12 degrees, flexion 90 degrees. He was able to complete bed mobility with min guard to support L knee, and performed functional transfers/gait approximately 24f with RW and min guard, cues for sequencing and safety and gait distance limited by fatigue. Also introduced stair training and patient able to navigate stairs with B UE support on railings, min guard for safety, and verbal cues for correct sequencing/pattern for stair navigation. He was left up in the recliner with all needs met and family present, all questions/concerns addressed this afternoon. Plan to review HEP and continue gait, complete education this afternoon.     Follow Up Recommendations Follow surgeon's recommendation for DC plan and follow-up therapies    Equipment Recommendations  None recommended by PT(has all necessary DME )    Recommendations for Other Services       Precautions / Restrictions Precautions Precautions: Fall Restrictions Weight Bearing Restrictions: Yes LLE Weight Bearing: Weight bearing as tolerated      Mobility  Bed Mobility Overal bed mobility: Needs Assistance Bed Mobility: Supine to Sit     Supine to sit: Min guard     General bed mobility comments: Min guard to support L LE during transfer   Transfers Overall transfer level: Needs assistance Equipment used: Rolling walker (2 wheeled) Transfers: Sit to/from Stand Sit to Stand: Min guard         General transfer comment: min guard, cues for hand  placement and sequencing but no physical assist given   Ambulation/Gait Ambulation/Gait assistance: Min guard Gait Distance (Feet): 250 Feet Assistive device: Rolling walker (2 wheeled) Gait Pattern/deviations: Step-through pattern;Decreased step length - right;Decreased stance time - left;Decreased dorsiflexion - left;Decreased dorsiflexion - right;Decreased weight shift to left;Trunk flexed Gait velocity: decreased    General Gait Details: cues for upright posture, heel-toe pattern, and sequencing with RW, limited by fatigue   Stairs Stairs: Yes Stairs assistance: Min guard Stair Management: Two rails;Step to pattern Number of Stairs: 5(2 large and 3 small ) General stair comments: min guard for safety, verbal cues for sequencing on stairs   Wheelchair Mobility    Modified Rankin (Stroke Patients Only)       Balance Overall balance assessment: Mild deficits observed, not formally tested                                           Pertinent Vitals/Pain Pain Assessment: 0-10 Pain Score: 2  Pain Location: L knee  Pain Descriptors / Indicators: Aching;Sore Pain Intervention(s): Limited activity within patient's tolerance;Monitored during session;Repositioned    Home Living Family/patient expects to be discharged to:: Private residence Living Arrangements: Spouse/significant other Available Help at Discharge: Family;Available 24 hours/day Type of Home: House Home Access: Stairs to enter Entrance Stairs-Rails: Right;Left;Can reach both Entrance Stairs-Number of Steps: 3 Home Layout: Two level;Able to live on main level with bedroom/bathroom Home Equipment: WGilford Rile- 2 wheels;Cane - single point;Bedside commode;Shower seat  Prior Function Level of Independence: Independent         Comments: Was very active and liked to play tennis      Hand Dominance        Extremity/Trunk Assessment   Upper Extremity Assessment Upper Extremity  Assessment: Overall WFL for tasks assessed    Lower Extremity Assessment Lower Extremity Assessment: Generalized weakness    Cervical / Trunk Assessment Cervical / Trunk Assessment: Normal  Communication   Communication: No difficulties  Cognition Arousal/Alertness: Awake/alert Behavior During Therapy: WFL for tasks assessed/performed Overall Cognitive Status: Within Functional Limits for tasks assessed                                        General Comments      Exercises Total Joint Exercises Goniometric ROM: L knee AROM in supine: extension 12 degrees, flexion 90 degrees    Assessment/Plan    PT Assessment Patient needs continued PT services  PT Problem List Decreased strength;Decreased range of motion;Decreased knowledge of use of DME;Decreased activity tolerance;Decreased safety awareness;Decreased balance;Decreased mobility;Decreased coordination       PT Treatment Interventions DME instruction;Balance training;Gait training;Neuromuscular re-education;Stair training;Functional mobility training;Patient/family education;Therapeutic activities;Therapeutic exercise;Manual techniques    PT Goals (Current goals can be found in the Care Plan section)  Acute Rehab PT Goals Patient Stated Goal: go home, start HHPT  PT Goal Formulation: With patient/family Time For Goal Achievement: 01/07/18 Potential to Achieve Goals: Good    Frequency 7X/week   Barriers to discharge        Co-evaluation               AM-PAC PT "6 Clicks" Mobility  Outcome Measure Help needed turning from your back to your side while in a flat bed without using bedrails?: None Help needed moving from lying on your back to sitting on the side of a flat bed without using bedrails?: A Little Help needed moving to and from a bed to a chair (including a wheelchair)?: A Little Help needed standing up from a chair using your arms (e.g., wheelchair or bedside chair)?: A Little Help  needed to walk in hospital room?: A Little Help needed climbing 3-5 steps with a railing? : A Little 6 Click Score: 19    End of Session   Activity Tolerance: Patient tolerated treatment well Patient left: in chair;with call bell/phone within reach;with family/visitor present Nurse Communication: Mobility status PT Visit Diagnosis: Difficulty in walking, not elsewhere classified (R26.2);Muscle weakness (generalized) (M62.81);Unsteadiness on feet (R26.81)    Time: 1324-4010 PT Time Calculation (min) (ACUTE ONLY): 45 min   Charges:   PT Evaluation $PT Eval Low Complexity: 1 Low PT Treatments $Gait Training: 8-22 mins $Self Care/Home Management: 8-22        Deniece Ree PT, DPT, CBIS  Supplemental Physical Therapist [place]    Pager [phone] Acute Rehab Office [phone]

## 2017-12-24 NOTE — Progress Notes (Signed)
Around 2300 last night, patient dangled at the bedside and then he walked around 150 feet with staff and walker.  Patient tolerated walk very well.  Pain medication provided at 2345.  Ice packs placed when patient went back to bed.

## 2017-12-29 ENCOUNTER — Encounter: Payer: Self-pay | Admitting: Endocrinology

## 2017-12-30 ENCOUNTER — Other Ambulatory Visit: Payer: Self-pay | Admitting: Endocrinology

## 2017-12-30 MED ORDER — NATEGLINIDE 120 MG PO TABS: 120.0000 mg | ORAL_TABLET | Freq: Three times a day (TID) | ORAL | 11 refills | Status: DC

## 2018-01-08 ENCOUNTER — Encounter: Payer: Self-pay | Admitting: Family Medicine

## 2018-01-08 ENCOUNTER — Ambulatory Visit (INDEPENDENT_AMBULATORY_CARE_PROVIDER_SITE_OTHER): Payer: Medicare Other | Admitting: Family Medicine

## 2018-01-08 VITALS — BP 124/68 | HR 91 | Temp 98.1°F | Ht 67.0 in | Wt 130.8 lb

## 2018-01-08 DIAGNOSIS — E039 Hypothyroidism, unspecified: Secondary | ICD-10-CM | POA: Diagnosis not present

## 2018-01-08 DIAGNOSIS — E1169 Type 2 diabetes mellitus with other specified complication: Secondary | ICD-10-CM

## 2018-01-08 DIAGNOSIS — E785 Hyperlipidemia, unspecified: Secondary | ICD-10-CM

## 2018-01-08 DIAGNOSIS — E119 Type 2 diabetes mellitus without complications: Secondary | ICD-10-CM | POA: Diagnosis not present

## 2018-01-08 DIAGNOSIS — N529 Male erectile dysfunction, unspecified: Secondary | ICD-10-CM

## 2018-01-08 DIAGNOSIS — N3281 Overactive bladder: Secondary | ICD-10-CM | POA: Diagnosis not present

## 2018-01-08 DIAGNOSIS — N401 Enlarged prostate with lower urinary tract symptoms: Secondary | ICD-10-CM

## 2018-01-08 DIAGNOSIS — N400 Enlarged prostate without lower urinary tract symptoms: Secondary | ICD-10-CM

## 2018-01-08 HISTORY — DX: Benign prostatic hyperplasia without lower urinary tract symptoms: N40.0

## 2018-01-08 NOTE — Assessment & Plan Note (Signed)
Stable. Continue simvastatin 10mg daily

## 2018-01-08 NOTE — Assessment & Plan Note (Signed)
Stable.  Continue Synthroid 50 mcg daily. 

## 2018-01-08 NOTE — Progress Notes (Signed)
   Subjective:  David Maynard is a 67 y.o. male who presents today with a chief complaint of type 2 diabetes and to transfer care to this office.Marland Kitchen.   HPI:  He has several chronic, stable medical conditions outlined below.    1.  Type 2 diabetes.  Currently managed by his endocrinologist, Dr. Everardo AllEllison.  Dr. Everardo AllEllison previously served as his primary care physician, however he is now switching to an endocrinology only role.  Patient is currently on several medications including bromocriptine 2.5 mg daily, Clomid 25 mg daily, WelChol 1250 mg daily, Victoza 1.8 mg daily, metformin 1000 mg twice daily, Starlix 120 mg 3 times daily with meals, and Actos 45 mg daily.  Sugars have been increased recently due to his recent knee replacement surgery, but are typically well controlled.  No reported polyuria polydipsia.  2.  Hypothyroidism.  Also managed by endocrinology.  Currently on Synthroid micrograms daily as well.  3.  Dyslipidemia.  On simvastatin 10 mg daily and WelChol as noted above.  Tolerating both of these well.  4.  BPH/ED/overactive bladder.  Follows with urology.  On Cialis 20 mg daily and Myrbetriq 50 mg daily.  ROS: Per HPI  PMH: He reports that he has never smoked. He has never used smokeless tobacco. He reports current alcohol use of about 1.0 standard drinks of alcohol per week. He reports that he does not use drugs.  Objective:  Physical Exam: BP 124/68 (BP Location: Left Arm, Patient Position: Sitting, Cuff Size: Normal)   Pulse 91   Temp 98.1 F (36.7 C) (Oral)   Ht 5\' 7"  (1.702 m)   Wt 130 lb 12 oz (59.3 kg)   SpO2 98%   BMI 20.48 kg/m   Wt Readings from Last 3 Encounters:  01/08/18 130 lb 12 oz (59.3 kg)  12/23/17 130 lb 9.6 oz (59.2 kg)  12/12/17 130 lb 9.6 oz (59.2 kg)  Gen: NAD, resting comfortably CV: RRR with no murmurs appreciated Pulm: NWOB, CTAB with no crackles, wheezes, or rhonchi GI: Normal bowel sounds present. Soft, Nontender, Nondistended. MSK: No  edema, cyanosis, or clubbing noted Skin: Warm, dry Neuro: Grossly normal, moves all extremities Psych: Normal affect and thought content  Assessment/Plan:  Diabetes mellitus, type II (HCC) Managed by endocrinology.  Continue current medications.  OAB (overactive bladder) Stable.  Continue Myrbetriq as directed by urology.  Hypothyroidism Stable.  Continue Synthroid 50 mcg daily.  ED (erectile dysfunction) Stable.  Continue Cialis 20mg  daily.  Dyslipidemia associated with type 2 diabetes mellitus (HCC) Stable.  Continue simvastatin 10 mg daily.  BPH  Stable.  Continue Cialis 20 mg daily.  Time Spent: I spent >40 minutes face-to-face with the patient, with more than half spent on coordinating care and counseling for management plan for his diabetes, hypothyroid, dyslipidemia, BPH, ED, and overactive bladder.  He will follow up with me in 1 year for his next visit.    Katina Degreealeb M. Jimmey RalphParker, MD 01/08/2018 11:30 AM

## 2018-01-08 NOTE — Assessment & Plan Note (Signed)
Stable.  Continue Cialis 20 mg daily. 

## 2018-01-08 NOTE — Assessment & Plan Note (Addendum)
Stable.  Continue Cialis 20 mg daily. 

## 2018-01-08 NOTE — Assessment & Plan Note (Signed)
Stable.  Continue Myrbetriq as directed by urology.

## 2018-01-08 NOTE — Assessment & Plan Note (Signed)
Managed by endocrinology.  Continue current medications.

## 2018-01-28 ENCOUNTER — Other Ambulatory Visit: Payer: Self-pay | Admitting: Endocrinology

## 2018-02-06 ENCOUNTER — Other Ambulatory Visit (INDEPENDENT_AMBULATORY_CARE_PROVIDER_SITE_OTHER): Payer: Medicare Other

## 2018-02-06 DIAGNOSIS — E119 Type 2 diabetes mellitus without complications: Secondary | ICD-10-CM

## 2018-02-06 LAB — HEMOGLOBIN A1C: Hgb A1c MFr Bld: 6.6 % — ABNORMAL HIGH (ref 4.6–6.5)

## 2018-02-09 ENCOUNTER — Encounter: Payer: Self-pay | Admitting: Family Medicine

## 2018-02-11 ENCOUNTER — Other Ambulatory Visit: Payer: Self-pay

## 2018-02-11 MED ORDER — AZITHROMYCIN 500 MG PO TABS: ORAL_TABLET | ORAL | 0 refills | Status: DC

## 2018-03-09 ENCOUNTER — Other Ambulatory Visit: Payer: Self-pay | Admitting: Endocrinology

## 2018-03-10 ENCOUNTER — Other Ambulatory Visit: Payer: Self-pay

## 2018-03-10 ENCOUNTER — Telehealth: Payer: Self-pay | Admitting: Family Medicine

## 2018-03-10 DIAGNOSIS — H9190 Unspecified hearing loss, unspecified ear: Secondary | ICD-10-CM

## 2018-03-10 NOTE — Telephone Encounter (Signed)
Referral placed.

## 2018-03-10 NOTE — Telephone Encounter (Signed)
Ok with me. Please place any necessary orders. 

## 2018-03-10 NOTE — Telephone Encounter (Signed)
See request °

## 2018-03-10 NOTE — Telephone Encounter (Signed)
Copied from CRM [id number]. Topic: Referral - Request for Referral >> Mar 10, 2018 11:58 AM Jens Som A wrote: Has patient seen PCP for this complaint? No *If NO, is insurance requiring patient see PCP for this issue before PCP can refer them? NO Referral for which specialty: Hearing Preferred provider/office: [facility] [phone] attention [facility] Dr. Tyrell Antonio Reason for referral: Hearing Loss

## 2018-03-10 NOTE — Telephone Encounter (Signed)
Please advise 

## 2018-03-11 LAB — HM DIABETES EYE EXAM

## 2018-03-15 ENCOUNTER — Encounter: Payer: Self-pay | Admitting: Family Medicine

## 2018-03-19 ENCOUNTER — Other Ambulatory Visit: Payer: Self-pay

## 2018-04-08 ENCOUNTER — Other Ambulatory Visit: Payer: Self-pay

## 2018-04-08 ENCOUNTER — Other Ambulatory Visit: Payer: Self-pay | Admitting: Endocrinology

## 2018-04-08 ENCOUNTER — Encounter: Payer: Self-pay | Admitting: Family Medicine

## 2018-04-08 ENCOUNTER — Encounter: Payer: Self-pay | Admitting: Endocrinology

## 2018-04-08 ENCOUNTER — Ambulatory Visit (INDEPENDENT_AMBULATORY_CARE_PROVIDER_SITE_OTHER): Payer: Medicare Other | Admitting: Endocrinology

## 2018-04-08 VITALS — BP 120/60 | HR 80 | Ht 67.0 in | Wt 140.6 lb

## 2018-04-08 DIAGNOSIS — E119 Type 2 diabetes mellitus without complications: Secondary | ICD-10-CM

## 2018-04-08 LAB — POCT GLYCOSYLATED HEMOGLOBIN (HGB A1C): HEMOGLOBIN A1C: 7.3 % — AB (ref 4.0–5.6)

## 2018-04-08 MED ORDER — OMEGA-3-ACID ETHYL ESTERS 1 G PO CAPS: 2.0000 g | ORAL_CAPSULE | Freq: Two times a day (BID) | ORAL | 3 refills | Status: DC

## 2018-04-08 MED ORDER — LEVOTHYROXINE SODIUM 50 MCG PO TABS: 50.0000 ug | ORAL_TABLET | Freq: Every day | ORAL | 3 refills | Status: DC

## 2018-04-08 MED ORDER — REPAGLINIDE 2 MG PO TABS: 2.0000 mg | ORAL_TABLET | Freq: Three times a day (TID) | ORAL | 11 refills | Status: DC

## 2018-04-08 NOTE — Patient Instructions (Addendum)
I have sent a prescription to your pharmacy, to change nateglinide to repaglinide.  You can use up the nateglinde by taking it with smaller meals. check your blood sugar 3 times a day.  vary the time of day when you check, between before the 3 meals, and at bedtime.  also check if you have symptoms of your blood sugar being too high or too low.  please keep a record of the readings and bring it to your next appointment here (or you can bring the meter itself).  You can write it on any piece of paper.  please call us sooner if your blood sugar goes below 70, or if you have a lot of readings over 200. Please come back for a follow-up appointment in 2 months.

## 2018-04-08 NOTE — Progress Notes (Signed)
Subjective:    Patient ID: David Maynard, male    DOB: [birth year], 68 y.o.   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   Dx'ed: 2993 Complications: renal insufficiency.   Therapy: victoza + 5 oral meds.  DKA: never Severe hypoglycemia: never.  Pancreatitis: never.  Other: he has never taken insulin.   Interval history: pt states he feels well in general.  Meter is downloaded today, and the printout is scanned into the record.  cbg varies from  114-209.   Past Medical History:  Diagnosis Date  . Anemia    history of anemia  . 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 Depressive disorder, not elsewhere classified    none  . Diabetes mellitus type II   . Hypercholesterolemia   . Hypogonadism male   . Primary localized osteoarthritis of left knee 12/11/2017  . Primary localized osteoarthritis of right knee 10/23/2017  . 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 Thyroid disease   . Unspecified hypothyroidism     Past Surgical History:  Procedure Laterality Date  . ESOPHAGOGASTRODUODENOSCOPY  06/16/2003  . INGUINAL HERNIA REPAIR     left  . JOINT REPLACEMENT    . KNEE ARTHROSCOPY     Left  . KNEE ARTHROSCOPY W/ ACL RECONSTRUCTION     left  . ROTATOR CUFF REPAIR     Right x2  . Stress Cardiolite  07/15/2000  . TOTAL KNEE ARTHROPLASTY Right 11/04/2017  . TOTAL KNEE ARTHROPLASTY Right 11/04/2017   Procedure: TOTAL KNEE ARTHROPLASTY;  Surgeon: Elsie Saas, MD;  Location: [place];  Service: Orthopedics;  Laterality: Right;  . TOTAL KNEE ARTHROPLASTY Left 12/23/2017  . TOTAL KNEE ARTHROPLASTY Left 12/23/2017   Procedure: TOTAL KNEE ARTHROPLASTY;  Surgeon: Elsie Saas, MD;  Location: [place];  Service: Orthopedics;  Laterality: Left;  Marland Kitchen VASECTOMY  05/2002    Social History   Socioeconomic History  . Marital status: Married    Spouse name: Not on file  .  Number of children: Not on file  . Years of education: Not on file  . Highest education level: Not on file  Occupational History  . Not on file  Social Needs  . Financial resource strain: Not on file  . Food insecurity:    Worry: Not on file    Inability: Not on file  . Transportation needs:    Medical: Not on file    Non-medical: Not on file  Tobacco Use  . Smoking status: Never Smoker  . Smokeless tobacco: Never Used  Substance and Sexual Activity  . Alcohol use: Yes    Alcohol/week: 1.0 standard drinks    Types: 1 Cans of beer per week    Comment: weekly  . Drug use: No  . Sexual activity: Not on file  Lifestyle  . Physical activity:    Days per week: Not on file    Minutes per session: Not on file  . Stress: Not on file  Relationships  . Social connections:    Talks on phone: Not on file    Gets together: Not on file    Attends religious service: Not on file    Active member of club or organization: Not on file    Attends meetings of clubs or organizations: Not on file    Relationship status: Not on file  .  Intimate partner violence:    Fear of current or ex partner: Not on file    Emotionally abused: Not on file    Physically abused: Not on file    Forced sexual activity: Not on file  Other Topics Concern  . Not on file  Social History Narrative  . Not on file    Current Outpatient Medications on File Prior to Visit  Medication Sig Dispense Refill  . acetaminophen (TYLENOL) 500 MG tablet Take 2 tablets (1,000 mg total) by mouth every 6 (six) hours. 30 tablet 0  . Blood Glucose Monitoring Suppl (ACCU-CHEK NANO SMARTVIEW) w/Device KIT Use to check blood sugar 6 times per day. 1 kit 0  . bromocriptine (PARLODEL) 2.5 MG tablet TAKE 1 TABLET BY MOUTH  DAILY 90 tablet 3  . Calcium Carbonate (CALCIUM 600 PO) Take 1 tablet by mouth daily.    Marland Kitchen Cinnamon (HM CINNAMON) 500 MG capsule Take 3,000 mg by mouth 2 (two) times daily.    . clomiPHENE (CLOMID) 50 MG tablet TAKE  ONE-HALF TABLET BY  MOUTH DAILY (Patient taking differently: Take 25 mg by mouth daily. ) 45 tablet 0  . colesevelam (WELCHOL) 625 MG tablet TAKE 2 TABLETS BY MOUTH  DAILY 180 tablet 0  . docusate sodium (COLACE) 100 MG capsule Take 100 mg by mouth 2 (two) times daily.    Marland Kitchen EASY TOUCH PEN NEEDLES 32G X 6 MM MISC USE ONCE DAILY IN THE  MORNING 90 each 4  . Ferrous Sulfate Dried 45 MG TBCR Take 1 tablet by mouth 2 (two) times daily.    . Glucosamine-Chondroit-Vit C-Mn (GLUCOSAMINE CHONDR 1500 COMPLX PO) Take 2 tablets by mouth daily.    Marland Kitchen liraglutide (VICTOZA) 18 MG/3ML SOPN INJECT SUBCUTANEOUSLY 1.'8MG'$  EVERY MORNING (Patient taking differently: Inject 1.8 mg into the skin daily. ) 27 mL 11  . mirabegron ER (MYRBETRIQ) 25 MG TB24 tablet Take 50 mg by mouth daily.    . Multiple Vitamin (MULTIVITAMIN) tablet Take 1 tablet by mouth daily.    . simvastatin (ZOCOR) 20 MG tablet TAKE 1 TABLET BY MOUTH AT  BEDTIME (Patient taking differently: Take 10 mg by mouth at bedtime. ) 90 tablet 3  . tadalafil (ADCIRCA/CIALIS) 20 MG tablet Take 20 mg by mouth daily as needed for erectile dysfunction.    . Turmeric 500 MG CAPS Take 1,500 mg by mouth daily.     No current facility-administered medications on file prior to visit.     No Known Allergies  Family History  Problem Relation Age of Onset  . Breast cancer Neg Hx   . Celiac disease Neg Hx   . Cirrhosis Neg Hx   . Clotting disorder Neg Hx   . Colitis Neg Hx   . Colon cancer Neg Hx   . Colon polyps Neg Hx   . Crohn's disease Neg Hx   . Cystic fibrosis Neg Hx   . Diabetes Neg Hx   . Esophageal cancer Neg Hx   . Heart disease Neg Hx   . Hemochromatosis Neg Hx   . Inflammatory bowel disease Neg Hx   . Irritable bowel syndrome Neg Hx   . Kidney disease Neg Hx   . Liver cancer Neg Hx   . Liver disease Neg Hx   . Ovarian cancer Neg Hx   . Pancreatic cancer Neg Hx   . Prostate cancer Neg Hx   . Rectal cancer Neg Hx   . Stomach cancer Neg Hx   .  Ulcerative colitis Neg Hx   . Uterine cancer Neg Hx   . Wilson's disease Neg Hx     BP 120/60 (BP Location: Right Arm, Patient Position: Sitting, Cuff Size: Normal)   Pulse 80   Ht '5\' 7"'$  (1.702 m)   Wt 140 lb 9.6 oz (63.8 kg)   SpO2 92%   BMI 22.02 kg/m    Review of Systems He denies hypoglycemia.      Objective:   Physical Exam VITAL SIGNS:  See vs page GENERAL: no distress Pulses: dorsalis pedis intact bilat.   MSK: no deformity of the feet CV: no leg edema, but there are bilat vv's. Skin:  no ulcer on the feet.  normal color and temp on the feet. 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    Patient Instructions  I have sent a prescription to your pharmacy, to change nateglinide to repaglinide.  You can use up the nateglinde by taking it with smaller meals. check your blood sugar 3 times a day.  vary the time of day when you check, between before the 3 meals, and at bedtime.  also check if you have symptoms of your blood sugar being too high or too low.  please keep a record of the readings and bring it to your next appointment here (or you can bring the meter itself).  You can write it on any piece of paper.  please call us sooner if your blood sugar goes below 70, or if you have a lot of readings over 200. Please come back for a follow-up appointment in 2 months.

## 2018-04-08 NOTE — Progress Notes (Signed)
Pt declined to have A1C completed today

## 2018-04-09 ENCOUNTER — Other Ambulatory Visit: Payer: Self-pay

## 2018-04-09 DIAGNOSIS — E119 Type 2 diabetes mellitus without complications: Secondary | ICD-10-CM

## 2018-04-09 MED ORDER — REPAGLINIDE 2 MG PO TABS: 2.0000 mg | ORAL_TABLET | Freq: Three times a day (TID) | ORAL | 11 refills | Status: DC

## 2018-04-14 ENCOUNTER — Other Ambulatory Visit: Payer: Self-pay | Admitting: Endocrinology

## 2018-04-14 ENCOUNTER — Encounter: Payer: Self-pay | Admitting: Endocrinology

## 2018-04-14 ENCOUNTER — Encounter: Payer: Self-pay | Admitting: Family Medicine

## 2018-04-14 DIAGNOSIS — E119 Type 2 diabetes mellitus without complications: Secondary | ICD-10-CM

## 2018-04-15 ENCOUNTER — Other Ambulatory Visit: Payer: Medicare Other

## 2018-04-15 ENCOUNTER — Other Ambulatory Visit: Payer: Self-pay

## 2018-04-15 DIAGNOSIS — E119 Type 2 diabetes mellitus without complications: Secondary | ICD-10-CM

## 2018-04-15 LAB — MICROALBUMIN / CREATININE URINE RATIO
CREATININE, U: 65.3 mg/dL
Microalb Creat Ratio: 2.8 mg/g (ref 0.0–30.0)
Microalb, Ur: 1.8 mg/dL (ref 0.0–1.9)

## 2018-04-16 ENCOUNTER — Encounter: Payer: Self-pay | Admitting: Endocrinology

## 2018-04-27 ENCOUNTER — Encounter: Payer: Self-pay | Admitting: Family Medicine

## 2018-04-28 ENCOUNTER — Ambulatory Visit (INDEPENDENT_AMBULATORY_CARE_PROVIDER_SITE_OTHER): Payer: Medicare Other | Admitting: Family Medicine

## 2018-04-28 ENCOUNTER — Encounter: Payer: Self-pay | Admitting: Family Medicine

## 2018-04-28 ENCOUNTER — Other Ambulatory Visit (INDEPENDENT_AMBULATORY_CARE_PROVIDER_SITE_OTHER): Payer: Medicare Other

## 2018-04-28 ENCOUNTER — Other Ambulatory Visit: Payer: Self-pay

## 2018-04-28 VITALS — BP 104/59 | HR 75 | Temp 98.0°F | Ht 67.0 in | Wt 140.0 lb

## 2018-04-28 DIAGNOSIS — R300 Dysuria: Secondary | ICD-10-CM

## 2018-04-28 LAB — POCT URINALYSIS DIPSTICK
Bilirubin, UA: NEGATIVE
Blood, UA: NEGATIVE
Glucose, UA: NEGATIVE
Ketones, UA: NEGATIVE
Leukocytes, UA: NEGATIVE
Nitrite, UA: NEGATIVE
Protein, UA: NEGATIVE
Spec Grav, UA: 1.02 (ref 1.010–1.025)
Urobilinogen, UA: 0.2 E.U./dL
pH, UA: 6 (ref 5.0–8.0)

## 2018-04-28 MED ORDER — CEPHALEXIN 500 MG PO CAPS: 500.0000 mg | ORAL_CAPSULE | Freq: Two times a day (BID) | ORAL | 0 refills | Status: AC

## 2018-04-28 NOTE — Progress Notes (Signed)
    Chief Complaint:  David Maynard is a 68 y.o. male who presents today for a virtual office visit with a chief complaint of dysuria.   Assessment/Plan:  Dysuria History consistent with prior bouts of UTI.  UA today is negative however given his history, will start empiric Keflex 500 mg twice daily x7 days until culture results return.  Encouraged good oral hydration.  Discussed reasons to return to care and seek emergent care.  If continues to have frequent UTIs, would consider urology referral.   Subjective:  HPI:  Dysuria  Started yesterday. Worsened over the past day.  Has discomfort at the tip of his penis.  No abdominal pain.  No back pain.  No fevers or chills.  No myalgias.  No treatments tried.  No other obvious alleviating or aggravating factors.  ROS: Per HPI  PMH: He reports that he has never smoked. He has never used smokeless tobacco. He reports current alcohol use of about 1.0 standard drinks of alcohol per week. He reports that he does not use drugs.      Objective/Observations  Physical Exam: BP (!) 104/59 (BP Location: Left Arm, Patient Position: Sitting, Cuff Size: Normal)   Pulse 75   Temp 98 F (36.7 C) (Oral)   Ht 5\' 7"  (1.702 m)   Wt 140 lb (63.5 kg)   BMI 21.93 kg/m  Gen: NAD, resting comfortably Pulm: Normal work of breathing Neuro: Grossly normal, moves all extremities Psych: Normal affect and thought content  Results for orders placed or performed in visit on 04/28/18 (from the past 24 hour(s))  POCT urinalysis dipstick     Status: None   Collection Time: 04/28/18  9:47 AM  Result Value Ref Range   Color, UA YELLOW    Clarity, UA CLEAR    Glucose, UA Negative Negative   Bilirubin, UA NEGATIVE    Ketones, UA NEGATIVE    Spec Grav, UA 1.020 1.010 - 1.025   Blood, UA NEGATIVE    pH, UA 6.0 5.0 - 8.0   Protein, UA Negative Negative   Urobilinogen, UA 0.2 0.2 or 1.0 E.U./dL   Nitrite, UA NEGATIVE    Leukocytes, UA Negative Negative   Appearance     Odor      Virtual Visit via Video   I connected with David Maynard on 04/28/18 at 10:40 AM EDT by a video enabled telemedicine application and verified that I am speaking with the correct person using two identifiers. I discussed the limitations of evaluation and management by telemedicine and the availability of in person appointments. The patient expressed understanding and agreed to proceed.   Patient location: Home Provider location: [facility] [employer] [facility] Persons participating in the virtual visit: Myself and patient     Katina Degree. Jimmey Ralph, MD 04/28/2018 10:54 AM

## 2018-04-29 ENCOUNTER — Other Ambulatory Visit: Payer: Self-pay | Admitting: Endocrinology

## 2018-04-30 ENCOUNTER — Encounter: Payer: Self-pay | Admitting: Family Medicine

## 2018-04-30 ENCOUNTER — Encounter: Payer: Self-pay | Admitting: Endocrinology

## 2018-04-30 LAB — URINE CULTURE
MICRO NUMBER:: 377565
Result:: NO GROWTH
SPECIMEN QUALITY:: ADEQUATE

## 2018-05-01 ENCOUNTER — Other Ambulatory Visit: Payer: Self-pay

## 2018-05-01 MED ORDER — COLESEVELAM HCL 625 MG PO TABS: 1250.0000 mg | ORAL_TABLET | Freq: Every day | ORAL | 3 refills | Status: DC

## 2018-05-05 ENCOUNTER — Other Ambulatory Visit: Payer: Self-pay | Admitting: Endocrinology

## 2018-05-05 MED ORDER — CLOMIPHENE CITRATE 50 MG PO TABS: 25.0000 mg | ORAL_TABLET | Freq: Every day | ORAL | 0 refills | Status: DC

## 2018-05-05 NOTE — Progress Notes (Signed)
Dr Lavone Neri interpretation of your lab work:  Your urine culture was NEGATIVE. It is ok for you to stop the antibiotics if you have not already done so. Please let us know if your symptoms have not improved.    If you have any additional questions, please give [place] a call or send [place] a message through [place].  Take care, Dr Jimmey Ralph

## 2018-06-06 ENCOUNTER — Other Ambulatory Visit: Payer: Self-pay

## 2018-06-09 ENCOUNTER — Other Ambulatory Visit: Payer: Self-pay

## 2018-06-09 ENCOUNTER — Other Ambulatory Visit: Payer: Self-pay | Admitting: Endocrinology

## 2018-06-10 ENCOUNTER — Ambulatory Visit (INDEPENDENT_AMBULATORY_CARE_PROVIDER_SITE_OTHER): Payer: Medicare Other | Admitting: Endocrinology

## 2018-06-10 ENCOUNTER — Encounter: Payer: Self-pay | Admitting: Endocrinology

## 2018-06-10 ENCOUNTER — Other Ambulatory Visit: Payer: Self-pay

## 2018-06-10 VITALS — BP 112/60 | HR 74 | Ht 67.0 in | Wt 143.0 lb

## 2018-06-10 DIAGNOSIS — E291 Testicular hypofunction: Secondary | ICD-10-CM

## 2018-06-10 DIAGNOSIS — Z125 Encounter for screening for malignant neoplasm of prostate: Secondary | ICD-10-CM

## 2018-06-10 DIAGNOSIS — E119 Type 2 diabetes mellitus without complications: Secondary | ICD-10-CM | POA: Diagnosis not present

## 2018-06-10 LAB — BASIC METABOLIC PANEL
BUN: 27 mg/dL — ABNORMAL HIGH (ref 6–23)
CO2: 31 mEq/L (ref 19–32)
Calcium: 9.5 mg/dL (ref 8.4–10.5)
Chloride: 98 mEq/L (ref 96–112)
Creatinine, Ser: 1.01 mg/dL (ref 0.40–1.50)
GFR: 73.48 mL/min (ref >60.00)
Glucose, Bld: 121 mg/dL — ABNORMAL HIGH (ref 70–99)
Potassium: 4.4 mEq/L (ref 3.5–5.1)
Sodium: 136 mEq/L (ref 135–145)

## 2018-06-10 LAB — POCT GLYCOSYLATED HEMOGLOBIN (HGB A1C): Hemoglobin A1C: 6.6 % — AB (ref 4.0–5.6)

## 2018-06-10 LAB — TSH: TSH: 2.1 u[IU]/mL (ref 0.35–4.50)

## 2018-06-10 LAB — PSA: PSA: 3.72 ng/mL (ref 0.10–4.00)

## 2018-06-10 MED ORDER — COLESEVELAM HCL 625 MG PO TABS: 2500.0000 mg | ORAL_TABLET | Freq: Every day | ORAL | 3 refills | Status: DC

## 2018-06-10 MED ORDER — ACCU-CHEK NANO SMARTVIEW W/DEVICE KIT: PACK | 0 refills | Status: DC

## 2018-06-10 MED ORDER — ACCU-CHEK GUIDE W/DEVICE KIT: 1.0000 | PACK | Freq: Once | 0 refills | Status: AC

## 2018-06-10 MED ORDER — CLOMIPHENE CITRATE 50 MG PO TABS: 25.0000 mg | ORAL_TABLET | Freq: Every day | ORAL | 0 refills | Status: DC

## 2018-06-10 NOTE — Progress Notes (Signed)
Subjective:    Patient ID: David Maynard, male    DOB: May 15, 1950, 68 y.o.   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    Dx'ed: 2008 Complications: none  Therapy: victoza + 5 oral meds.  DKA: never.  Severe hypoglycemia: never.  Pancreatitis: never.  Other: he has never taken insulin.    Interval history: pt states he feels well in general.  Meter is downloaded today, and the printout is scanned into the record.  cbg varies from  60-249.   Past Medical History:  Diagnosis Date  . Anemia    history of anemia  . 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 Depressive disorder, not elsewhere classified    none  . Diabetes mellitus type II   . Hypercholesterolemia   . Hypogonadism male   . Primary localized osteoarthritis of left knee 12/11/2017  . Primary localized osteoarthritis of right knee 10/23/2017  . 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 Thyroid disease   . Unspecified hypothyroidism     Past Surgical History:  Procedure Laterality Date  . ESOPHAGOGASTRODUODENOSCOPY  06/16/2003  . INGUINAL HERNIA REPAIR     left  . JOINT REPLACEMENT    . KNEE ARTHROSCOPY     Left  . KNEE ARTHROSCOPY W/ ACL RECONSTRUCTION     left  . ROTATOR CUFF REPAIR     Right x2  . Stress Cardiolite  07/15/2000  . TOTAL KNEE ARTHROPLASTY Right 11/04/2017  . TOTAL KNEE ARTHROPLASTY Right 11/04/2017   Procedure: TOTAL KNEE ARTHROPLASTY;  Surgeon: Salvatore Marvel, MD;  Location: [facility] OR;  Service: Orthopedics;  Laterality: Right;  . TOTAL KNEE ARTHROPLASTY Left 12/23/2017  . TOTAL KNEE ARTHROPLASTY Left 12/23/2017   Procedure: TOTAL KNEE ARTHROPLASTY;  Surgeon: Salvatore Marvel, MD;  Location: [facility] OR;  Service: Orthopedics;  Laterality: Left;  Marland Kitchen VASECTOMY  05/2002    Social History   Socioeconomic History  . Marital status: Married    Spouse name: Not on file  . Number of children:  Not on file  . Years of education: Not on file  . Highest education level: Not on file  Occupational History  . Not on file  Social Needs  . Financial resource strain: Not on file  . Food insecurity:    Worry: Not on file    Inability: Not on file  . Transportation needs:    Medical: Not on file    Non-medical: Not on file  Tobacco Use  . Smoking status: Never Smoker  . Smokeless tobacco: Never Used  Substance and Sexual Activity  . Alcohol use: Yes    Alcohol/week: 1.0 standard drinks    Types: 1 Cans of beer per week    Comment: weekly  . Drug use: No  . Sexual activity: Not on file  Lifestyle  . Physical activity:    Days per week: Not on file    Minutes per session: Not on file  . Stress: Not on file  Relationships  . Social connections:    Talks on phone: Not on file    Gets together: Not on file    Attends religious service: Not on file    Active member of club or organization: Not on file    Attends meetings of clubs or organizations: Not on file    Relationship status: Not on  file  . Intimate partner violence:    Fear of current or ex partner: Not on file    Emotionally abused: Not on file    Physically abused: Not on file    Forced sexual activity: Not on file  Other Topics Concern  . Not on file  Social History Narrative  . Not on file    Current Outpatient Medications on File Prior to Visit  Medication Sig Dispense Refill  . acetaminophen (TYLENOL) 500 MG tablet Take 2 tablets (1,000 mg total) by mouth every 6 (six) hours. 30 tablet 0  . bromocriptine (PARLODEL) 2.5 MG tablet TAKE 1 TABLET BY MOUTH  DAILY 90 tablet 3  . Calcium Carbonate (CALCIUM 600 PO) Take 1 tablet by mouth daily.    Marland Kitchen Cinnamon (HM CINNAMON) 500 MG capsule Take 3,000 mg by mouth 2 (two) times daily.    Marland Kitchen docusate sodium (COLACE) 100 MG capsule Take 100 mg by mouth 2 (two) times daily.    Marland Kitchen EASY TOUCH PEN NEEDLES 32G X 6 MM MISC USE ONCE DAILY IN THE  MORNING 90 each 4  . Ferrous  Sulfate Dried 45 MG TBCR Take 1 tablet by mouth 2 (two) times daily.    . Glucosamine-Chondroit-Vit C-Mn (GLUCOSAMINE CHONDR 1500 COMPLX PO) Take 2 tablets by mouth daily.    Marland Kitchen levothyroxine (SYNTHROID, LEVOTHROID) 50 MCG tablet Take 1 tablet (50 mcg total) by mouth daily before breakfast. 90 tablet 3  . liraglutide (VICTOZA) 18 MG/3ML SOPN INJECT SUBCUTANEOUSLY 1.8MG  EVERY MORNING (Patient taking differently: Inject 1.8 mg into the skin daily. ) 27 mL 11  . metFORMIN (GLUCOPHAGE-XR) 500 MG 24 hr tablet TAKE 2 TABLETS BY MOUTH TWO TIMES DAILY 360 tablet 3  . mirabegron ER (MYRBETRIQ) 25 MG TB24 tablet Take 50 mg by mouth daily.    . Multiple Vitamin (MULTIVITAMIN) tablet Take 1 tablet by mouth daily.    Marland Kitchen omega-3 acid ethyl esters (LOVAZA) 1 g capsule Take 2 capsules (2 g total) by mouth 2 (two) times daily. 360 capsule 3  . pioglitazone (ACTOS) 45 MG tablet TAKE 1 TABLET BY MOUTH  DAILY 90 tablet 3  . repaglinide (PRANDIN) 2 MG tablet Take 1 tablet (2 mg total) by mouth 3 (three) times daily before meals. 90 tablet 11  . simvastatin (ZOCOR) 20 MG tablet TAKE 1 TABLET BY MOUTH AT  BEDTIME (Patient taking differently: Take 10 mg by mouth at bedtime. ) 90 tablet 3  . tadalafil (ADCIRCA/CIALIS) 20 MG tablet Take 20 mg by mouth daily as needed for erectile dysfunction.    . Turmeric 500 MG CAPS Take 1,500 mg by mouth daily.     No current facility-administered medications on file prior to visit.     No Known Allergies  Family History  Problem Relation Age of Onset  . Breast cancer Neg Hx   . Celiac disease Neg Hx   . Cirrhosis Neg Hx   . Clotting disorder Neg Hx   . Colitis Neg Hx   . Colon cancer Neg Hx   . Colon polyps Neg Hx   . Crohn's disease Neg Hx   . Cystic fibrosis Neg Hx   . Diabetes Neg Hx   . Esophageal cancer Neg Hx   . Heart disease Neg Hx   . Hemochromatosis Neg Hx   . Inflammatory bowel disease Neg Hx   . Irritable bowel syndrome Neg Hx   . Kidney disease Neg Hx   .  Liver cancer Neg Hx   .  Liver disease Neg Hx   . Ovarian cancer Neg Hx   . Pancreatic cancer Neg Hx   . Prostate cancer Neg Hx   . Rectal cancer Neg Hx   . Stomach cancer Neg Hx   . Ulcerative colitis Neg Hx   . Uterine cancer Neg Hx   . Wilson's disease Neg Hx     BP 112/60 (BP Location: Left Arm, Patient Position: Sitting, Cuff Size: Normal)   Pulse 74   Ht 5\' 7"  (1.702 m)   Wt 143 lb (64.9 kg)   SpO2 97%   BMI 22.40 kg/m    Review of Systems He denies hypoglycemia    Objective:   Physical Exam VITAL SIGNS:  See vs page GENERAL: no distress Pulses: dorsalis pedis intact bilat.   MSK: no deformity of the feet CV: no leg edema, but there bilat vv's.  Skin:  no ulcer on the feet.  normal color and temp on the feet.  Neuro: sensation is intact to touch on the feet.   Lab Results  Component Value Date   CREATININE 0.98 12/24/2017   BUN 14 12/24/2017   NA 133 (L) 12/24/2017   K 4.0 12/24/2017   CL 99 12/24/2017   CO2 25 12/24/2017     Lab Results  Component Value Date   HGBA1C 6.6 (A) 06/10/2018      Assessment & Plan:  Type 2 DM: he needs increased rx VV's: This limits rx options Hypoglycemia: This limits aggressiveness of glycemic control Lean body habitus: we discussed evolution to type 1 DM  Patient Instructions  I have sent a prescription to your pharmacy, to increase the welchol Please continue the same other diabetes medications. Please come back for a follow-up appointment in 3-4 months.

## 2018-06-10 NOTE — Patient Instructions (Signed)
I have sent a prescription to your pharmacy, to increase the welchol Please continue the same other diabetes medications. Please come back for a follow-up appointment in 3-4 months.

## 2018-06-11 ENCOUNTER — Other Ambulatory Visit: Payer: Self-pay | Admitting: Endocrinology

## 2018-06-11 LAB — TESTOSTERONE,FREE AND TOTAL
Testosterone, Free: 16.4 pg/mL (ref 6.6–18.1)
Testosterone: 918 ng/dL — ABNORMAL HIGH (ref 264–916)

## 2018-06-11 MED ORDER — CLOMIPHENE CITRATE 50 MG PO TABS: ORAL_TABLET | ORAL | 3 refills | Status: DC

## 2018-06-23 ENCOUNTER — Other Ambulatory Visit: Payer: Self-pay

## 2018-06-23 ENCOUNTER — Ambulatory Visit
Admission: RE | Admit: 2018-06-23 | Discharge: 2018-06-23 | Disposition: A | Discharge status: Home / Self Care | Payer: Medicare Other | Source: Ambulatory Visit | Attending: Orthopedic Surgery | Admitting: Orthopedic Surgery

## 2018-06-23 ENCOUNTER — Other Ambulatory Visit: Payer: Self-pay | Admitting: Orthopedic Surgery

## 2018-06-23 DIAGNOSIS — M25562 Pain in left knee: Secondary | ICD-10-CM

## 2018-06-26 ENCOUNTER — Other Ambulatory Visit: Payer: Medicare Other

## 2018-08-08 ENCOUNTER — Encounter: Payer: Self-pay | Admitting: Endocrinology

## 2018-08-08 NOTE — Telephone Encounter (Signed)
Please advise 

## 2018-08-20 ENCOUNTER — Telehealth: Payer: Self-pay | Admitting: Family Medicine

## 2018-08-20 NOTE — Telephone Encounter (Signed)
I left a message asking the patient to call me at [phone] to schedule AWV with Loma Sousa. Last AWV 06/07/17 so patient can be scheduled at next available opening. VDM (Dee-Dee)

## 2018-08-22 ENCOUNTER — Other Ambulatory Visit: Payer: Self-pay | Admitting: Endocrinology

## 2018-08-22 NOTE — Telephone Encounter (Signed)
The patient called me back. AWV with Courtney scheduled for 09/02/2018

## 2018-08-23 NOTE — Telephone Encounter (Signed)
Please forward refill request to pt's new primary care provider.  

## 2018-08-25 NOTE — Telephone Encounter (Signed)
Per Dr. Cordelia Pen request, I am forwarding you this refill request. Please review and refill if deemed appropriate.

## 2018-08-26 ENCOUNTER — Other Ambulatory Visit: Payer: Self-pay | Admitting: Endocrinology

## 2018-09-02 ENCOUNTER — Ambulatory Visit (INDEPENDENT_AMBULATORY_CARE_PROVIDER_SITE_OTHER): Payer: Medicare Other

## 2018-09-02 ENCOUNTER — Other Ambulatory Visit: Payer: Self-pay

## 2018-09-02 VITALS — BP 124/62 | Temp 97.7°F | Ht 67.0 in | Wt 147.0 lb

## 2018-09-02 DIAGNOSIS — Z0000 Encounter for general adult medical examination without abnormal findings: Secondary | ICD-10-CM | POA: Diagnosis not present

## 2018-09-02 NOTE — Progress Notes (Signed)
I have personally reviewed the Medicare Annual Wellness Visit and agree with the assessment and plan.  David Maynard. Jerline Pain, MD 09/02/2018 1:03 PM

## 2018-09-02 NOTE — Progress Notes (Signed)
Subjective:   David Maynard is a 68 y.o. male who presents for Medicare Annual/Subsequent preventive examination.  Review of Systems:    Objective:    Vitals: BP 124/62   Temp 97.7 F (36.5 C)   Ht 5\' 7"  (1.702 m)   Wt 147 lb (66.7 kg)   BMI 23.02 kg/m   Body mass index is 23.02 kg/m.  Advanced Directives 09/02/2018 12/24/2017 12/23/2017 12/12/2017 11/04/2017 10/24/2017 06/07/2017  Does Patient Have a Medical Advance Directive? Yes Yes - Yes Yes Yes No  Type of Advance Directive Living will;Healthcare Power of State Street Corporationttorney Healthcare Power of [place];Living will - Healthcare Power of [place];Living will Healthcare Power of [place];Living will Healthcare Power of [place];Living will -  Does patient want to make changes to medical advance directive? No - Patient declined No - Patient declined - No - Patient declined No - Patient declined - -  Copy of Healthcare Power of Attorney in Chart? Yes - validated most recent copy scanned in chart (See row information) - Yes - validated most recent copy scanned in chart (See row information) No - copy requested No - copy requested No - copy requested -    Tobacco Social History   Tobacco Use  Smoking Status Never Smoker  Smokeless Tobacco Never Used      Clinical Intake:  Pre-visit preparation completed: Yes  Pain : No/denies pain  BMI - recorded: 23.02 Nutritional Status: BMI of 19-24  Normal Nutritional Risks: None Diabetes: Yes CBG done?: No Did pt. bring in CBG monitor from home?: No  How often do you need to have someone help you when you read instructions, pamphlets, or other written materials from your doctor or pharmacy?: 1 - Never  Interpreter Needed?: No  Information entered by :: Kandis Fantasiaourtney Nivedita Mirabella LPN  Past Medical History:  Diagnosis Date  . Anemia    history of anemia  . 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 Depressive disorder, not elsewhere classified    none  . Diabetes mellitus type II   . Hypercholesterolemia    . Hypogonadism male   . Primary localized osteoarthritis of left knee 12/11/2017  . Primary localized osteoarthritis of right knee 10/23/2017  . 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 Thyroid disease   . Unspecified hypothyroidism    Past Surgical History:  Procedure Laterality Date  . ESOPHAGOGASTRODUODENOSCOPY  06/16/2003  . INGUINAL HERNIA REPAIR     left  . JOINT REPLACEMENT    . KNEE ARTHROSCOPY     Left  . KNEE ARTHROSCOPY W/ ACL RECONSTRUCTION     left  . ROTATOR CUFF REPAIR     Right x2  . Stress Cardiolite  07/15/2000  . TOTAL KNEE ARTHROPLASTY Right 11/04/2017  . TOTAL KNEE ARTHROPLASTY Right 11/04/2017   Procedure: TOTAL KNEE ARTHROPLASTY;  Surgeon: Salvatore MarvelWainer, Robert, MD;  [facility];  Service: Orthopedics;  Laterality: Right;  . TOTAL KNEE ARTHROPLASTY Left 12/23/2017  . TOTAL KNEE ARTHROPLASTY Left 12/23/2017   Procedure: TOTAL KNEE ARTHROPLASTY;  Surgeon: Salvatore MarvelWainer, Robert, MD;  [facility];  Service: Orthopedics;  Laterality: Left;  Marland Kitchen. VASECTOMY  05/2002   Family History  Problem Relation Age of Onset  . Breast cancer Neg Hx   . Celiac disease Neg Hx   . Cirrhosis Neg Hx   . Clotting disorder Neg Hx   . Colitis Neg Hx   . Colon cancer Neg Hx   . Colon polyps Neg Hx   . Crohn's disease Neg  Hx   . Cystic fibrosis Neg Hx   . Diabetes Neg Hx   . Esophageal cancer Neg Hx   . Heart disease Neg Hx   . Hemochromatosis Neg Hx   . Inflammatory bowel disease Neg Hx   . Irritable bowel syndrome Neg Hx   . Kidney disease Neg Hx   . Liver cancer Neg Hx   . Liver disease Neg Hx   . Ovarian cancer Neg Hx   . Pancreatic cancer Neg Hx   . Prostate cancer Neg Hx   . Rectal cancer Neg Hx   . Stomach cancer Neg Hx   . Ulcerative colitis Neg Hx   . Uterine cancer Neg Hx   . Wilson's disease Neg Hx    Social History   Socioeconomic History  . Marital status: Married    Spouse name: Not on file  . Number of children: Not on file  . Years of education:  Not on file  . Highest education level: Not on file  Occupational History  . Not on file  Social Needs  . Financial resource strain: Not on file  . Food insecurity    Worry: Not on file    Inability: Not on file  . Transportation needs    Medical: Not on file    Non-medical: Not on file  Tobacco Use  . Smoking status: Never Smoker  . Smokeless tobacco: Never Used  Substance and Sexual Activity  . Alcohol use: Yes    Alcohol/week: 1.0 standard drinks    Types: 1 Cans of beer per week    Comment: weekly  . Drug use: No  . Sexual activity: Not on file  Lifestyle  . Physical activity    Days per week: Not on file    Minutes per session: Not on file  . Stress: Not on file  Relationships  . Social Musicianconnections    Talks on phone: Not on file    Gets together: Not on file    Attends religious service: Not on file    Active member of club or organization: Not on file    Attends meetings of clubs or organizations: Not on file    Relationship status: Not on file  Other Topics Concern  . Not on file  Social History Narrative  . Not on file    Outpatient Encounter Medications as of 09/02/2018  Medication Sig  . ACCU-CHEK GUIDE test strip USE TO CHECK BLOOD SUGAR 6  TIMES DAILY  . acetaminophen (TYLENOL) 500 MG tablet Take 2 tablets (1,000 mg total) by mouth every 6 (six) hours.  . bromocriptine (PARLODEL) 2.5 MG tablet TAKE 1 TABLET BY MOUTH  DAILY  . Calcium Carbonate (CALCIUM 600 PO) Take 1 tablet by mouth daily.  Marland Kitchen. Cinnamon (HM CINNAMON) 500 MG capsule Take 3,000 mg by mouth 2 (two) times daily.  . clomiPHENE (CLOMID) 50 MG tablet TAKE ONE-HALF TABLET BY  MOUTH DAILY  . colesevelam (WELCHOL) 625 MG tablet Take 4 tablets (2,500 mg total) by mouth daily.  Marland Kitchen. docusate sodium (COLACE) 100 MG capsule Take 100 mg by mouth 2 (two) times daily.  Marland Kitchen. EASY TOUCH PEN NEEDLES 32G X 6 MM MISC USE ONCE DAILY IN THE  MORNING  . Ferrous Sulfate Dried 45 MG TBCR Take 1 tablet by mouth 2 (two)  times daily.  . Glucosamine-Chondroit-Vit C-Mn (GLUCOSAMINE CHONDR 1500 COMPLX PO) Take 2 tablets by mouth daily.  Marland Kitchen. levothyroxine (SYNTHROID, LEVOTHROID) 50 MCG tablet Take 1 tablet (50  mcg total) by mouth daily before breakfast.  . liraglutide (VICTOZA) 18 MG/3ML SOPN INJECT SUBCUTANEOUSLY 1.8MG  EVERY MORNING (Patient taking differently: Inject 1.8 mg into the skin daily. )  . metFORMIN (GLUCOPHAGE-XR) 500 MG 24 hr tablet TAKE 2 TABLETS BY MOUTH TWO TIMES DAILY  . mirabegron ER (MYRBETRIQ) 25 MG TB24 tablet Take 50 mg by mouth daily.  . Multiple Vitamin (MULTIVITAMIN) tablet Take 1 tablet by mouth daily.  . nateglinide (STARLIX) 120 MG tablet   . omega-3 acid ethyl esters (LOVAZA) 1 g capsule Take 2 capsules (2 g total) by mouth 2 (two) times daily.  . pioglitazone (ACTOS) 45 MG tablet TAKE 1 TABLET BY MOUTH  DAILY  . repaglinide (PRANDIN) 2 MG tablet Take 1 tablet (2 mg total) by mouth 3 (three) times daily before meals.  . simvastatin (ZOCOR) 20 MG tablet Take 0.5 tablets (10 mg total) by mouth at bedtime.  . tadalafil (ADCIRCA/CIALIS) 20 MG tablet Take 20 mg by mouth daily as needed for erectile dysfunction.  . Turmeric 500 MG CAPS Take 1,500 mg by mouth daily.   No facility-administered encounter medications on file as of 09/02/2018.     Activities of Daily Living In your present state of health, do you have any difficulty performing the following activities: 09/02/2018 12/24/2017  Hearing? Y -  Comment hearing aid to left ear -  Vision? N -  Difficulty concentrating or making decisions? N -  Walking or climbing stairs? N -  Dressing or bathing? N -  Doing errands, shopping? N Y  Conservation officer, nature and eating ? N -  Using the Toilet? N -  In the past six months, have you accidently leaked urine? N -  Do you have problems with loss of bowel control? N -  Managing your Medications? N -  Managing your Finances? N -  Housekeeping or managing your Housekeeping? N -  Some recent data might  be hidden    Patient Care Team: Vivi Barrack, MD as PCP - General (Family Medicine) Kristeen Miss, MD as Attending Physician (Neurosurgery) Renato Shin, MD as Consulting Physician (Endocrinology) Elsie Saas, MD as Consulting Physician (Orthopedic Surgery) Midge Minium, DMD as Consulting Physician (Dentistry) [facility] as Consulting Physician (Orthopedic Surgery) Karen Kays, NP as Nurse Practitioner (Nurse Practitioner) Calvert Cantor, MD as Consulting Physician (Ophthalmology)   Assessment:   This is a routine wellness examination for [place].  Exercise Activities and Dietary recommendations Current Exercise Habits: Home exercise routine, Type of exercise: Other - see comments, Time (Minutes): 40, Frequency (Times/Week): 5, Weekly Exercise (Minutes/Week): 200, Intensity: Mild, Exercise limited by: orthopedic condition(s)  Goals    . Patient Stated     Return to being able to play tennis and golf        Fall Risk Fall Risk  09/02/2018 04/28/2018 01/24/2016 05/13/2013  Falls in the past year? 1 0 No No  Number falls in past yr: 0 - - -  Injury with Fall? 1 - - -  Risk for fall due to : Impaired balance/gait - - -  Follow up Education provided - - -   Timed Get Up and Go Performed: completed and within normal limits   Depression Screen PHQ 2/9 Scores 09/02/2018 01/08/2018 01/24/2016  PHQ - 2 Score 0 0 0    Cognitive Function     6CIT Screen 09/02/2018  What Year? 0 points  What month? 0 points  What time? 0 points  Count back from 20  0 points  Months in reverse 0 points  Repeat phrase 0 points  Total Score 0    Immunization History  Administered Date(s) Administered  . Influenza Split 12/10/2011  . Influenza Whole 11/22/2009  . Influenza, High Dose Seasonal PF 10/13/2015, 12/03/2016, 10/22/2017  . Influenza-Unspecified 11/10/2013  . Pneumococcal Conjugate-13 11/28/2015  . Pneumococcal Polysaccharide-23 03/29/2009, 12/03/2016   . Tdap 11/09/2013  . Zoster 01/22/2010  . Zoster Recombinat (Shingrix) 05/28/2016, 09/03/2016    Screening Tests Health Maintenance  Topic Date Due  . INFLUENZA VACCINE  08/23/2018  . HEMOGLOBIN A1C  12/11/2018  . OPHTHALMOLOGY EXAM  03/12/2019  . FOOT EXAM  04/08/2019  . URINE MICROALBUMIN  04/15/2019  . COLONOSCOPY  04/12/2020  . TETANUS/TDAP  11/10/2023  . Hepatitis C Screening  Completed  . PNA vac Low Risk Adult  Completed   Cancer Screenings: Colorectal: completed 04/13/2010      Plan:    I have personally reviewed and addressed the Medicare Annual Wellness questionnaire and have noted the following in the patient's chart:  A. Medical and social history B. Use of alcohol, tobacco or illicit drugs  C. Current medications and supplements D. Functional ability and status E.  Nutritional status F.  Physical activity G. Advance directives H. List of other physicians I.  Hospitalizations, surgeries, and ER visits in previous 12 months J.  Vitals K. Screenings such as hearing and vision if needed, cognitive and depression L. Referrals, records requested, and appointments-   In addition, I have reviewed and discussed with patient certain preventive protocols, quality metrics, and best practice recommendations. A written personalized care plan for preventive services as well as general preventive health recommendations were provided to patient.   Signed,  Kandis Fantasiaourtney Zuria Fosdick, LPN  Nurse Health Advisor   Nurse Notes: none

## 2018-09-02 NOTE — Patient Instructions (Signed)
David Maynard , Thank you for taking time to come for your Medicare Wellness Visit. I appreciate your ongoing commitment to your health goals. Please review the following plan we discussed and let me know if I can assist you in the future.   Screening recommendations/referrals: Colorectal Screening: up to date last 04/13/10  Vision and Dental Exams: Recommended annual ophthalmology exams for early detection of glaucoma and other disorders of the eye Recommended annual dental exams for proper oral hygiene  Diabetic Exams: Diabetic Eye Exam: up to date (Dr. Bing Plume)  Diabetic Foot Exam: 04/08/2018  Vaccinations: Influenza vaccine:  recommended this fall either at PCP office or through your local pharmacy  Pneumococcal vaccine: completed  Tdap vaccine: completed Shingles vaccine: completed   Advanced directives: We have received a copy of your POA (Power of [place]) and/or Living Will. These documents can be located in your chart.  Goals:  Recommend to exercise for at least 150 minutes per week.  Next appointment: Please schedule your Annual Wellness Visit with your Nurse Health Advisor in one year.  Preventive Care 68 Years and Older, Male Preventive care refers to lifestyle choices and visits with your health care provider that can promote health and wellness. What does preventive care include?  A yearly physical exam. This is also called an annual well check.  Dental exams once or twice a year.  Routine eye exams. Ask your health care provider how often you should have your eyes checked.  Personal lifestyle choices, including:  Daily care of your teeth and gums.  Regular physical activity.  Eating a healthy diet.  Avoiding tobacco and drug use.  Limiting alcohol use.  Practicing safe sex.  Taking low doses of aspirin every day if recommended by your health care provider..  Taking vitamin and mineral supplements as recommended by your health care provider. What happens  during an annual well check? The services and screenings done by your health care provider during your annual well check will depend on your age, overall health, lifestyle risk factors, and family history of disease. Counseling  Your health care provider may ask you questions about your:  Alcohol use.  Tobacco use.  Drug use.  Emotional well-being.  Home and relationship well-being.  Sexual activity.  Eating habits.  History of falls.  Memory and ability to understand (cognition).  Work and work Statistician. Screening  You may have the following tests or measurements:  Height, weight, and BMI.  Blood pressure.  Lipid and cholesterol levels. These may be checked every 5 years, or more frequently if you are over 64 years old.  Skin check.  Lung cancer screening. You may have this screening every year starting at age 94 if you have a 30-pack-year history of smoking and currently smoke or have quit within the past 15 years.  Fecal occult blood test (FOBT) of the stool. You may have this test every year starting at age 40.  Flexible sigmoidoscopy or colonoscopy. You may have a sigmoidoscopy every 5 years or a colonoscopy every 10 years starting at age 28.  Prostate cancer screening. Recommendations will vary depending on your family history and other risks.  Hepatitis C blood test.  Hepatitis B blood test.  Sexually transmitted disease (STD) testing.  Diabetes screening. This is done by checking your blood sugar (glucose) after you have not eaten for a while (fasting). You may have this done every 1-3 years.  Abdominal aortic aneurysm (AAA) screening. You may need this if you are a current  or former smoker.  Osteoporosis. You may be screened starting at age 29 if you are at high risk. Talk with your health care provider about your test results, treatment options, and if necessary, the need for more tests. Vaccines  Your health care provider may recommend certain  vaccines, such as:  Influenza vaccine. This is recommended every year.  Tetanus, diphtheria, and acellular pertussis (Tdap, Td) vaccine. You may need a Td booster every 10 years.  Zoster vaccine. You may need this after age 49.  Pneumococcal 13-valent conjugate (PCV13) vaccine. One dose is recommended after age 79.  Pneumococcal polysaccharide (PPSV23) vaccine. One dose is recommended after age 9. Talk to your health care provider about which screenings and vaccines you need and how often you need them. This information is not intended to replace advice given to you by your health care provider. Make sure you discuss any questions you have with your health care provider. Document Released: 02/04/2015 Document Revised: 09/28/2015 Document Reviewed: 11/09/2014 Elsevier Interactive Patient Education  2017 [place] Prevention in the Home Falls can cause injuries. They can happen to people of all ages. There are many things you can do to make your home safe and to help prevent falls. What can I do on the outside of my home?  Regularly fix the edges of walkways and driveways and fix any cracks.  Remove anything that might make you trip as you walk through a door, such as a raised step or threshold.  Trim any bushes or trees on the path to your home.  Use bright outdoor lighting.  Clear any walking paths of anything that might make someone trip, such as rocks or tools.  Regularly check to see if handrails are loose or broken. Make sure that both sides of any steps have handrails.  Any raised decks and porches should have guardrails on the edges.  Have any leaves, snow, or ice cleared regularly.  Use sand or salt on walking paths during winter.  Clean up any spills in your garage right away. This includes oil or grease spills. What can I do in the bathroom?  Use night lights.  Install grab bars by the toilet and in the tub and shower. Do not use towel bars as grab  bars.  Use non-skid mats or decals in the tub or shower.  If you need to sit down in the shower, use a plastic, non-slip stool.  Keep the floor dry. Clean up any water that spills on the floor as soon as it happens.  Remove soap buildup in the tub or shower regularly.  Attach bath mats securely with double-sided non-slip rug tape.  Do not have throw rugs and other things on the floor that can make you trip. What can I do in the bedroom?  Use night lights.  Make sure that you have a light by your bed that is easy to reach.  Do not use any sheets or blankets that are too big for your bed. They should not hang down onto the floor.  Have a firm chair that has side arms. You can use this for support while you get dressed.  Do not have throw rugs and other things on the floor that can make you trip. What can I do in the kitchen?  Clean up any spills right away.  Avoid walking on wet floors.  Keep items that you use a lot in easy-to-reach places.  If you need to reach something above you,  use a strong step stool that has a grab bar.  Keep electrical cords out of the way.  Do not use floor polish or wax that makes floors slippery. If you must use wax, use non-skid floor wax.  Do not have throw rugs and other things on the floor that can make you trip. What can I do with my stairs?  Do not leave any items on the stairs.  Make sure that there are handrails on both sides of the stairs and use them. Fix handrails that are broken or loose. Make sure that handrails are as long as the stairways.  Check any carpeting to make sure that it is firmly attached to the stairs. Fix any carpet that is loose or worn.  Avoid having throw rugs at the top or bottom of the stairs. If you do have throw rugs, attach them to the floor with carpet tape.  Make sure that you have a light switch at the top of the stairs and the bottom of the stairs. If you do not have them, ask someone to add them for  you. What else can I do to help prevent falls?  Wear shoes that:  Do not have high heels.  Have rubber bottoms.  Are comfortable and fit you well.  Are closed at the toe. Do not wear sandals.  If you use a stepladder:  Make sure that it is fully opened. Do not climb a closed stepladder.  Make sure that both sides of the stepladder are locked into place.  Ask someone to hold it for you, if possible.  Clearly mark and make sure that you can see:  Any grab bars or handrails.  First and last steps.  Where the edge of each step is.  Use tools that help you move around (mobility aids) if they are needed. These include:  Canes.  Walkers.  Scooters.  Crutches.  Turn on the lights when you go into a dark area. Replace any light bulbs as soon as they burn out.  Set up your furniture so you have a clear path. Avoid moving your furniture around.  If any of your floors are uneven, fix them.  If there are any pets around you, be aware of where they are.  Review your medicines with your doctor. Some medicines can make you feel dizzy. This can increase your chance of falling. Ask your doctor what other things that you can do to help prevent falls. This information is not intended to replace advice given to you by your health care provider. Make sure you discuss any questions you have with your health care provider. Document Released: 11/04/2008 Document Revised: 06/16/2015 Document Reviewed: 02/12/2014 Elsevier Interactive Patient Education  2017 [employer].

## 2018-09-05 ENCOUNTER — Other Ambulatory Visit: Payer: Self-pay

## 2018-09-05 ENCOUNTER — Other Ambulatory Visit: Payer: Self-pay | Admitting: Endocrinology

## 2018-09-05 NOTE — Telephone Encounter (Signed)
Please advise on refill.

## 2018-09-09 ENCOUNTER — Encounter: Payer: Self-pay | Admitting: Endocrinology

## 2018-09-09 ENCOUNTER — Ambulatory Visit (INDEPENDENT_AMBULATORY_CARE_PROVIDER_SITE_OTHER): Payer: Medicare Other | Admitting: Endocrinology

## 2018-09-09 ENCOUNTER — Other Ambulatory Visit: Payer: Self-pay

## 2018-09-09 VITALS — BP 120/70 | HR 67 | Ht 67.0 in | Wt 146.4 lb

## 2018-09-09 DIAGNOSIS — E119 Type 2 diabetes mellitus without complications: Secondary | ICD-10-CM | POA: Diagnosis not present

## 2018-09-09 DIAGNOSIS — R609 Edema, unspecified: Secondary | ICD-10-CM

## 2018-09-09 LAB — POCT GLYCOSYLATED HEMOGLOBIN (HGB A1C): Hemoglobin A1C: 5.8 % — AB (ref 4.0–5.6)

## 2018-09-09 NOTE — Patient Instructions (Addendum)
Please continue the same diabetes medications. check your blood sugar 4 times a day: before the 3 meals, and at bedtime.  also check if you have symptoms of your blood sugar being too high or too low.  please keep a record of the readings and bring it to your next appointment here (or you can bring the meter itself).  You can write it on any piece of paper.  please call us sooner if your blood sugar goes below 70, or if you have a lot of readings over 200. Please come back for a follow-up appointment in 3-4 months.

## 2018-09-09 NOTE — Progress Notes (Signed)
Subjective:    Patient ID: David Maynard, male    DOB: [birth year], 68 y.o.   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    Dx'ed: 2008 Complications: none  Therapy: victoza + 5 oral meds.  DKA: never.  Severe hypoglycemia: never.  Pancreatitis: never.  Other: he has never taken insulin.    Interval history: pt states he feels well in general.  Meter is downloaded today, and the printout is scanned into the record.  cbg varies from  58-200.  He takes both repaglinide and nateglinide (total of 4 doses per day).   Past Medical History:  Diagnosis Date  . Anemia    history of anemia  . 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 Depressive disorder, not elsewhere classified    none  . Diabetes mellitus type II   . Hypercholesterolemia   . Hypogonadism male   . Primary localized osteoarthritis of left knee 12/11/2017  . Primary localized osteoarthritis of right knee 10/23/2017  . 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 Thyroid disease   . Unspecified hypothyroidism     Past Surgical History:  Procedure Laterality Date  . ESOPHAGOGASTRODUODENOSCOPY  06/16/2003  . INGUINAL HERNIA REPAIR     left  . JOINT REPLACEMENT    . KNEE ARTHROSCOPY     Left  . KNEE ARTHROSCOPY W/ ACL RECONSTRUCTION     left  . ROTATOR CUFF REPAIR     Right x2  . Stress Cardiolite  07/15/2000  . TOTAL KNEE ARTHROPLASTY Right 11/04/2017  . TOTAL KNEE ARTHROPLASTY Right 11/04/2017   Procedure: TOTAL KNEE ARTHROPLASTY;  Surgeon: Salvatore MarvelWainer, Robert, MD;  [facility];  Service: Orthopedics;  Laterality: Right;  . TOTAL KNEE ARTHROPLASTY Left 12/23/2017  . TOTAL KNEE ARTHROPLASTY Left 12/23/2017   Procedure: TOTAL KNEE ARTHROPLASTY;  Surgeon: Salvatore MarvelWainer, Robert, MD;  [facility];  Service: Orthopedics;  Laterality: Left;  Marland Kitchen. VASECTOMY  05/2002    Social History   Socioeconomic History  .  Marital status: Married    Spouse name: Not on file  . Number of children: Not on file  . Years of education: Not on file  . Highest education level: Not on file  Occupational History  . Not on file  Social Needs  . Financial resource strain: Not on file  . Food insecurity    Worry: Not on file    Inability: Not on file  . Transportation needs    Medical: Not on file    Non-medical: Not on file  Tobacco Use  . Smoking status: Never Smoker  . Smokeless tobacco: Never Used  Substance and Sexual Activity  . Alcohol use: Yes    Alcohol/week: 1.0 standard drinks    Types: 1 Cans of beer per week    Comment: weekly  . Drug use: No  . Sexual activity: Not on file  Lifestyle  . Physical activity    Days per week: Not on file    Minutes per session: Not on file  . Stress: Not on file  Relationships  . Social Musicianconnections    Talks on phone: Not on file    Gets together: Not on file    Attends religious service: Not on file    Active member of club or organization: Not on file    Attends meetings of  clubs or organizations: Not on file    Relationship status: Not on file  . Intimate partner violence    Fear of current or ex partner: Not on file    Emotionally abused: Not on file    Physically abused: Not on file    Forced sexual activity: Not on file  Other Topics Concern  . Not on file  Social History Narrative  . Not on file    Current Outpatient Medications on File Prior to Visit  Medication Sig Dispense Refill  . ACCU-CHEK GUIDE test strip USE TO CHECK BLOOD SUGAR 6  TIMES DAILY 600 strip 3  . bromocriptine (PARLODEL) 2.5 MG tablet TAKE 1 TABLET BY MOUTH  DAILY 90 tablet 3  . Calcium Carbonate (CALCIUM 600 PO) Take 1 tablet by mouth daily.    Marland Kitchen Cinnamon (HM CINNAMON) 500 MG capsule Take 3,000 mg by mouth 2 (two) times daily.    . clomiPHENE (CLOMID) 50 MG tablet TAKE ONE-HALF TABLET BY  MOUTH DAILY 45 tablet 0  . colesevelam (WELCHOL) 625 MG tablet Take 4 tablets (2,500  mg total) by mouth daily. 360 tablet 3  . docusate sodium (COLACE) 100 MG capsule Take 100 mg by mouth 2 (two) times daily.    Marland Kitchen EASY TOUCH PEN NEEDLES 32G X 6 MM MISC USE ONCE DAILY IN THE  MORNING 90 each 4  . Ferrous Sulfate Dried 45 MG TBCR Take 1 tablet by mouth 2 (two) times daily.    . Glucosamine-Chondroit-Vit C-Mn (GLUCOSAMINE CHONDR 1500 COMPLX PO) Take 2 tablets by mouth daily.    Marland Kitchen levothyroxine (SYNTHROID, LEVOTHROID) 50 MCG tablet Take 1 tablet (50 mcg total) by mouth daily before breakfast. 90 tablet 3  . liraglutide (VICTOZA) 18 MG/3ML SOPN INJECT SUBCUTANEOUSLY 1.8MG  EVERY MORNING (Patient taking differently: Inject 1.8 mg into the skin daily. ) 27 mL 11  . metFORMIN (GLUCOPHAGE-XR) 500 MG 24 hr tablet TAKE 2 TABLETS BY MOUTH TWO TIMES DAILY 360 tablet 3  . mirabegron ER (MYRBETRIQ) 25 MG TB24 tablet Take 50 mg by mouth daily.    . Multiple Vitamin (MULTIVITAMIN) tablet Take 1 tablet by mouth daily.    . nateglinide (STARLIX) 120 MG tablet TAKE 1 TABLET(120 MG) BY MOUTH THREE TIMES DAILY WITH MEALS (Patient taking differently: Take 120 mg by mouth 3 (three) times daily as needed. ) 270 tablet 3  . omega-3 acid ethyl esters (LOVAZA) 1 g capsule Take 2 capsules (2 g total) by mouth 2 (two) times daily. 360 capsule 3  . pioglitazone (ACTOS) 45 MG tablet TAKE 1 TABLET BY MOUTH  DAILY 90 tablet 3  . repaglinide (PRANDIN) 2 MG tablet Take 1 tablet (2 mg total) by mouth 3 (three) times daily before meals. 90 tablet 11  . simvastatin (ZOCOR) 20 MG tablet Take 0.5 tablets (10 mg total) by mouth at bedtime. 45 tablet 3  . tadalafil (ADCIRCA/CIALIS) 20 MG tablet Take 20 mg by mouth daily as needed for erectile dysfunction.    . Turmeric 500 MG CAPS Take 1,500 mg by mouth daily.    Marland Kitchen acetaminophen (TYLENOL) 500 MG tablet Take 2 tablets (1,000 mg total) by mouth every 6 (six) hours. 30 tablet 0   No current facility-administered medications on file prior to visit.     No Known Allergies   Family History  Problem Relation Age of Onset  . Breast cancer Neg Hx   . Celiac disease Neg Hx   . Cirrhosis Neg Hx   . Clotting  disorder Neg Hx   . Colitis Neg Hx   . Colon cancer Neg Hx   . Colon polyps Neg Hx   . Crohn's disease Neg Hx   . Cystic fibrosis Neg Hx   . Diabetes Neg Hx   . Esophageal cancer Neg Hx   . Heart disease Neg Hx   . Hemochromatosis Neg Hx   . Inflammatory bowel disease Neg Hx   . Irritable bowel syndrome Neg Hx   . Kidney disease Neg Hx   . Liver cancer Neg Hx   . Liver disease Neg Hx   . Ovarian cancer Neg Hx   . Pancreatic cancer Neg Hx   . Prostate cancer Neg Hx   . Rectal cancer Neg Hx   . Stomach cancer Neg Hx   . Ulcerative colitis Neg Hx   . Uterine cancer Neg Hx   . Wilson's disease Neg Hx     BP 120/70 (BP Location: Left Arm, Patient Position: Sitting, Cuff Size: Normal)   Pulse 67   Ht 5\' 7"  (1.702 m)   Wt 146 lb 6.4 oz (66.4 kg)   SpO2 92%   BMI 22.93 kg/m    Review of Systems Denies LOC    Objective:   Physical Exam VITAL SIGNS:  See vs page GENERAL: no distress.   Pulses: dorsalis pedis intact bilat.   MSK: no deformity of the feet.  CV: trace bilat leg edema, and bilat vv's.   Skin:  no ulcer on the feet.  normal color and temp on the feet.   Neuro: sensation is intact to touch on the feet.    Lab Results  Component Value Date   HGBA1C 5.8 (A) 09/09/2018       Assessment & Plan:  Type 2 DM: well-controlled Hypoglycemia: we discussed.  He declines to change rx.   Edema: This limits rx options  Patient Instructions  Please continue the same diabetes medications. check your blood sugar 4 times a day: before the 3 meals, and at bedtime.  also check if you have symptoms of your blood sugar being too high or too low.  please keep a record of the readings and bring it to your next appointment here (or you can bring the meter itself).  You can write it on any piece of paper.  please call us sooner if your blood sugar  goes below 70, or if you have a lot of readings over 200. Please come back for a follow-up appointment in 3-4 months.

## 2018-10-24 ENCOUNTER — Other Ambulatory Visit: Payer: Self-pay | Admitting: Endocrinology

## 2018-11-25 ENCOUNTER — Other Ambulatory Visit: Payer: Self-pay | Admitting: Endocrinology

## 2018-11-25 ENCOUNTER — Telehealth: Payer: Self-pay

## 2018-11-25 NOTE — Telephone Encounter (Signed)
Following letter sent via My Chart:  November 25, 2018  [place] [address]  Dear David Maynard  Our records indicate that you requested a prescription refill or renewal and that your pharmacy informed you that we had denied these requests.  Please be aware that none of the requested refills were denied. Please also refer to the confirmation receipts that were sent to [place] by your pharmacy today:  bromocriptine (PARLODEL) 2.5 MG tablet 90 tablet 3 11/25/2018    Sig: TAKE 1 TABLET BY MOUTH  DAILY   Sent to pharmacy as: bromocriptine (PARLODEL) 2.5 MG tablet   Notes to Pharmacy: Requesting 1 year supply   E-Prescribing Status: Receipt confirmed by pharmacy (11/25/2018 10:53 AM EST)    clomiPHENE (CLOMID) 50 MG tablet 45 tablet 0 11/25/2018    Sig: TAKE ONE-HALF TABLET BY  MOUTH DAILY   Sent to pharmacy as: clomiPHENE (CLOMID) 50 MG tablet   E-Prescribing Status: Receipt confirmed by pharmacy (11/25/2018 10:53 AM EST)    Pharmacy:  [facility], [place]  We ask that you please follow up with your pharmacy regarding the misinformation they are providing you with.  Sincerely,   [facility]

## 2018-11-25 NOTE — Telephone Encounter (Signed)
Patient has sent in 3 medicine refills and each one has been denied patient wants to know why   Medication: clomiPHENE (CLOMID) 50 MG tablet  Please call and advise Patient prefers [employer]

## 2018-12-02 ENCOUNTER — Encounter: Payer: Self-pay | Admitting: Endocrinology

## 2018-12-04 ENCOUNTER — Other Ambulatory Visit: Payer: Self-pay

## 2018-12-06 ENCOUNTER — Other Ambulatory Visit: Payer: Self-pay | Admitting: Endocrinology

## 2018-12-07 ENCOUNTER — Other Ambulatory Visit: Payer: Self-pay | Admitting: Endocrinology

## 2018-12-08 ENCOUNTER — Ambulatory Visit (INDEPENDENT_AMBULATORY_CARE_PROVIDER_SITE_OTHER): Payer: Medicare Other | Admitting: Endocrinology

## 2018-12-08 ENCOUNTER — Other Ambulatory Visit: Payer: Self-pay

## 2018-12-08 ENCOUNTER — Encounter: Payer: Self-pay | Admitting: Endocrinology

## 2018-12-08 VITALS — BP 146/82 | HR 70 | Ht 67.0 in | Wt 145.0 lb

## 2018-12-08 DIAGNOSIS — E119 Type 2 diabetes mellitus without complications: Secondary | ICD-10-CM

## 2018-12-08 DIAGNOSIS — E291 Testicular hypofunction: Secondary | ICD-10-CM | POA: Diagnosis not present

## 2018-12-08 DIAGNOSIS — K5900 Constipation, unspecified: Secondary | ICD-10-CM | POA: Diagnosis not present

## 2018-12-08 DIAGNOSIS — R609 Edema, unspecified: Secondary | ICD-10-CM | POA: Diagnosis not present

## 2018-12-08 DIAGNOSIS — Z23 Encounter for immunization: Secondary | ICD-10-CM

## 2018-12-08 LAB — POCT GLYCOSYLATED HEMOGLOBIN (HGB A1C): Hemoglobin A1C: 5.8 % — AB (ref 4.0–5.6)

## 2018-12-08 MED ORDER — CLOMIPHENE CITRATE 50 MG PO TABS: 25.0000 mg | ORAL_TABLET | ORAL | 3 refills | Status: DC

## 2018-12-08 NOTE — Patient Instructions (Addendum)
Please continue the same diabetes medications. Please check to see if your metformin is extended-release.  If so, please let know, so change to immed-release.  This change would help your bowels.  check your blood sugar 4 times a day: before the 3 meals, and at bedtime.  also check if you have symptoms of your blood sugar being too high or too low.  please keep a record of the readings and bring it to your next appointment here (or you can bring the meter itself).  You can write it on any piece of paper.  please call us sooner if your blood sugar goes below 70, or if you have a lot of readings over 200. Please reduce the clomiphene to 1/2 tab every other day.   Please come back for a follow-up appointment in 3-4 months.

## 2018-12-08 NOTE — Progress Notes (Signed)
Subjective:    Patient ID: David Maynard, male    DOB: [birth year], 68 y.o.   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   Dx'ed: 2008 Complications: none  Therapy: victoza + 6 oral meds.  DKA: never.  Severe hypoglycemia: never.  Pancreatitis: never.  Other: he has never taken insulin.    Interval history: pt states he feels well in general, except for constipation.  He brings his meter with his cbg's which I have reviewed today.  cbg varies from 78-180.   Past Medical History:  Diagnosis Date  . Anemia    history of anemia  . 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 Depressive disorder, not elsewhere classified    none  . Diabetes mellitus type II   . Hypercholesterolemia   . Hypogonadism male   . Primary localized osteoarthritis of left knee 12/11/2017  . Primary localized osteoarthritis of right knee 10/23/2017  . 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 Thyroid disease   . Unspecified hypothyroidism     Past Surgical History:  Procedure Laterality Date  . ESOPHAGOGASTRODUODENOSCOPY  06/16/2003  . INGUINAL HERNIA REPAIR     left  . JOINT REPLACEMENT    . KNEE ARTHROSCOPY     Left  . KNEE ARTHROSCOPY W/ ACL RECONSTRUCTION     left  . ROTATOR CUFF REPAIR     Right x2  . Stress Cardiolite  07/15/2000  . TOTAL KNEE ARTHROPLASTY Right 11/04/2017  . TOTAL KNEE ARTHROPLASTY Right 11/04/2017   Procedure: TOTAL KNEE ARTHROPLASTY;  Surgeon: Salvatore Marvel, MD;  Location: [facility] OR;  Service: Orthopedics;  Laterality: Right;  . TOTAL KNEE ARTHROPLASTY Left 12/23/2017  . TOTAL KNEE ARTHROPLASTY Left 12/23/2017   Procedure: TOTAL KNEE ARTHROPLASTY;  Surgeon: Salvatore Marvel, MD;  Location: [facility] OR;  Service: Orthopedics;  Laterality: Left;  Marland Kitchen VASECTOMY  05/2002    Social History   Socioeconomic History  . Marital status: Married    Spouse name: Not on file  .  Number of children: Not on file  . Years of education: Not on file  . Highest education level: Not on file  Occupational History  . Not on file  Social Needs  . Financial resource strain: Not on file  . Food insecurity    Worry: Not on file    Inability: Not on file  . Transportation needs    Medical: Not on file    Non-medical: Not on file  Tobacco Use  . Smoking status: Never Smoker  . Smokeless tobacco: Never Used  Substance and Sexual Activity  . Alcohol use: Yes    Alcohol/week: 1.0 standard drinks    Types: 1 Cans of beer per week    Comment: weekly  . Drug use: No  . Sexual activity: Not on file  Lifestyle  . Physical activity    Days per week: Not on file    Minutes per session: Not on file  . Stress: Not on file  Relationships  . Social Musician on phone: Not on file    Gets together: Not on file    Attends religious service: Not on file    Active member of club or organization: Not on file    Attends meetings of clubs or organizations: Not on file    Relationship status: Not  on file  . Intimate partner violence    Fear of current or ex partner: Not on file    Emotionally abused: Not on file    Physically abused: Not on file    Forced sexual activity: Not on file  Other Topics Concern  . Not on file  Social History Narrative  . Not on file    Current Outpatient Medications on File Prior to Visit  Medication Sig Dispense Refill  . ACCU-CHEK GUIDE test strip USE TO CHECK BLOOD SUGAR 6  TIMES DAILY 600 strip 3  . bromocriptine (PARLODEL) 2.5 MG tablet TAKE 1 TABLET BY MOUTH  DAILY 90 tablet 3  . Calcium Carbonate (CALCIUM 600 PO) Take 1 tablet by mouth daily.    Marland Kitchen. Cinnamon (HM CINNAMON) 500 MG capsule Take 3,000 mg by mouth 2 (two) times daily.    . colesevelam (WELCHOL) 625 MG tablet Take 4 tablets (2,500 mg total) by mouth daily. 360 tablet 3  . docusate sodium (COLACE) 100 MG capsule Take 100 mg by mouth 2 (two) times daily.    Marland Kitchen. EASY TOUCH  PEN NEEDLES 32G X 6 MM MISC USE ONCE DAILY IN THE  MORNING 90 each 4  . Ferrous Sulfate Dried 45 MG TBCR Take 1 tablet by mouth 2 (two) times daily.    . Glucosamine-Chondroit-Vit C-Mn (GLUCOSAMINE CHONDR 1500 COMPLX PO) Take 2 tablets by mouth daily.    Marland Kitchen. levothyroxine (SYNTHROID, LEVOTHROID) 50 MCG tablet Take 1 tablet (50 mcg total) by mouth daily before breakfast. 90 tablet 3  . liraglutide (VICTOZA) 18 MG/3ML SOPN Inject 0.3 mLs (1.8 mg total) into the skin daily. 9 mL 11  . metFORMIN (GLUCOPHAGE-XR) 500 MG 24 hr tablet TAKE 2 TABLETS BY MOUTH TWO TIMES DAILY 360 tablet 3  . mirabegron ER (MYRBETRIQ) 25 MG TB24 tablet Take 50 mg by mouth daily.    . Multiple Vitamin (MULTIVITAMIN) tablet Take 1 tablet by mouth daily.    . nateglinide (STARLIX) 120 MG tablet TAKE 1 TABLET(120 MG) BY MOUTH THREE TIMES DAILY WITH MEALS 270 tablet 3  . omega-3 acid ethyl esters (LOVAZA) 1 g capsule Take 2 capsules (2 g total) by mouth 2 (two) times daily. 360 capsule 3  . pioglitazone (ACTOS) 45 MG tablet TAKE 1 TABLET BY MOUTH  DAILY 90 tablet 3  . repaglinide (PRANDIN) 2 MG tablet Take 1 tablet (2 mg total) by mouth 3 (three) times daily before meals. 90 tablet 11  . simvastatin (ZOCOR) 20 MG tablet Take 0.5 tablets (10 mg total) by mouth at bedtime. 45 tablet 3  . tadalafil (ADCIRCA/CIALIS) 20 MG tablet Take 20 mg by mouth daily as needed for erectile dysfunction.    . Turmeric 500 MG CAPS Take 1,500 mg by mouth daily.     No current facility-administered medications on file prior to visit.     No Known Allergies  Family History  Problem Relation Age of Onset  . Breast cancer Neg Hx   . Celiac disease Neg Hx   . Cirrhosis Neg Hx   . Clotting disorder Neg Hx   . Colitis Neg Hx   . Colon cancer Neg Hx   . Colon polyps Neg Hx   . Crohn's disease Neg Hx   . Cystic fibrosis Neg Hx   . Diabetes Neg Hx   . Esophageal cancer Neg Hx   . Heart disease Neg Hx   . Hemochromatosis Neg Hx   . Inflammatory  bowel disease Neg Hx   .  Irritable bowel syndrome Neg Hx   . Kidney disease Neg Hx   . Liver cancer Neg Hx   . Liver disease Neg Hx   . Ovarian cancer Neg Hx   . Pancreatic cancer Neg Hx   . Prostate cancer Neg Hx   . Rectal cancer Neg Hx   . Stomach cancer Neg Hx   . Ulcerative colitis Neg Hx   . Uterine cancer Neg Hx   . Wilson's disease Neg Hx     BP (!) 146/82 (BP Location: Left Arm, Patient Position: Sitting, Cuff Size: Normal)   Pulse 70   Ht 5\' 7"  (1.702 m)   Wt 145 lb (65.8 kg)   SpO2 98%   BMI 22.71 kg/m   Review of Systems He denies hypoglycemia.      Objective:   Physical Exam VITAL SIGNS:  See vs page GENERAL: no distress Pulses: dorsalis pedis intact bilat.   MSK: no deformity of the feet CV: trace bilat leg edema, and bilat vv's Skin:  no ulcer on the feet.  normal color and temp on the feet. Neuro: sensation is intact to touch on the feet    Lab Results  Component Value Date   HGBA1C 5.8 (A) 12/08/2018   Lab Results  Component Value Date   TESTOSTERONE 918 (H) 06/10/2018      Assessment & Plan:  Hypogonadism: overcontrolled Type 2 DM: well-controlled Constipation: prob due to colesevelam Edema: This limits rx options HTN: recheck next time  Patient Instructions  Please continue the same diabetes medications. Please check to see if your metformin is extended-release.  If so, please let know, so change to immed-release.  This change would help your bowels.  check your blood sugar 4 times a day: before the 3 meals, and at bedtime.  also check if you have symptoms of your blood sugar being too high or too low.  please keep a record of the readings and bring it to your next appointment here (or you can bring the meter itself).  You can write it on any piece of paper.  please call us sooner if your blood sugar goes below 70, or if you have a lot of readings over 200. Please reduce the clomiphene to 1/2 tab every other day.   Please come back for a  follow-up appointment in 3-4 months.

## 2018-12-11 ENCOUNTER — Encounter: Payer: Self-pay | Admitting: Family Medicine

## 2019-01-12 ENCOUNTER — Other Ambulatory Visit: Payer: Self-pay

## 2019-01-13 ENCOUNTER — Telehealth: Payer: Self-pay

## 2019-01-13 ENCOUNTER — Encounter: Payer: Self-pay | Admitting: Family Medicine

## 2019-01-13 ENCOUNTER — Encounter: Payer: Self-pay | Admitting: Endocrinology

## 2019-01-13 ENCOUNTER — Telehealth: Payer: Self-pay | Admitting: Family Medicine

## 2019-01-13 ENCOUNTER — Ambulatory Visit (INDEPENDENT_AMBULATORY_CARE_PROVIDER_SITE_OTHER): Payer: Medicare Other | Admitting: Family Medicine

## 2019-01-13 VITALS — BP 110/69 | HR 73 | Temp 97.7°F | Ht 67.0 in | Wt 141.2 lb

## 2019-01-13 DIAGNOSIS — K219 Gastro-esophageal reflux disease without esophagitis: Secondary | ICD-10-CM | POA: Insufficient documentation

## 2019-01-13 DIAGNOSIS — E785 Hyperlipidemia, unspecified: Secondary | ICD-10-CM

## 2019-01-13 DIAGNOSIS — E1169 Type 2 diabetes mellitus with other specified complication: Secondary | ICD-10-CM | POA: Diagnosis not present

## 2019-01-13 DIAGNOSIS — E039 Hypothyroidism, unspecified: Secondary | ICD-10-CM

## 2019-01-13 DIAGNOSIS — E119 Type 2 diabetes mellitus without complications: Secondary | ICD-10-CM

## 2019-01-13 DIAGNOSIS — N3281 Overactive bladder: Secondary | ICD-10-CM

## 2019-01-13 DIAGNOSIS — Z0001 Encounter for general adult medical examination with abnormal findings: Secondary | ICD-10-CM

## 2019-01-13 DIAGNOSIS — N529 Male erectile dysfunction, unspecified: Secondary | ICD-10-CM

## 2019-01-13 DIAGNOSIS — N401 Enlarged prostate with lower urinary tract symptoms: Secondary | ICD-10-CM

## 2019-01-13 DIAGNOSIS — Z125 Encounter for screening for malignant neoplasm of prostate: Secondary | ICD-10-CM | POA: Diagnosis not present

## 2019-01-13 LAB — LIPID PANEL
Cholesterol: 146 mg/dL (ref 0–200)
HDL: 75.7 mg/dL (ref >39.00)
LDL Cholesterol: 62 mg/dL (ref 0–99)
NonHDL: 70.73
Total CHOL/HDL Ratio: 2
Triglycerides: 42 mg/dL (ref 0.0–149.0)
VLDL: 8.4 mg/dL (ref 0.0–40.0)

## 2019-01-13 LAB — COMPREHENSIVE METABOLIC PANEL
ALT: 22 U/L (ref 0–53)
AST: 32 U/L (ref 0–37)
Albumin: 4.2 g/dL (ref 3.5–5.2)
Alkaline Phosphatase: 43 U/L (ref 39–117)
BUN: 19 mg/dL (ref 6–23)
CO2: 28 mEq/L (ref 19–32)
Calcium: 9.4 mg/dL (ref 8.4–10.5)
Chloride: 98 mEq/L (ref 96–112)
Creatinine, Ser: 0.88 mg/dL (ref 0.40–1.50)
GFR: 85.99 mL/min (ref >60.00)
Glucose, Bld: 103 mg/dL — ABNORMAL HIGH (ref 70–99)
Potassium: 4.6 mEq/L (ref 3.5–5.1)
Sodium: 133 mEq/L — ABNORMAL LOW (ref 135–145)
Total Bilirubin: 0.3 mg/dL (ref 0.2–1.2)
Total Protein: 5.9 g/dL — ABNORMAL LOW (ref 6.0–8.3)

## 2019-01-13 LAB — CBC
HCT: 39.4 % (ref 39.0–52.0)
Hemoglobin: 13.2 g/dL (ref 13.0–17.0)
MCHC: 33.4 g/dL (ref 30.0–36.0)
MCV: 90.8 fl (ref 78.0–100.0)
Platelets: 193 10*3/uL (ref 150.0–400.0)
RBC: 4.34 Mil/uL (ref 4.22–5.81)
RDW: 14.2 % (ref 11.5–15.5)
WBC: 6 10*3/uL (ref 4.0–10.5)

## 2019-01-13 LAB — TSH: TSH: 6.67 u[IU]/mL — ABNORMAL HIGH (ref 0.35–4.50)

## 2019-01-13 LAB — PSA: PSA: 3.97 ng/mL (ref 0.10–4.00)

## 2019-01-13 NOTE — Assessment & Plan Note (Signed)
Continue management per urology. 

## 2019-01-13 NOTE — Telephone Encounter (Signed)
Patient stated his Wife takes omeprazole 40 and wanted to see if he could be prescribed It also fir acud reflux. Please advise.

## 2019-01-13 NOTE — Progress Notes (Signed)
Please inform patient of the following:  Thyroid level is off slightly but everything else is NORMAL. He may benefit form slightly increasing the dose of his thyroid medication, but recommend he follow up with Dr Loanne Drilling.  We can recheck again in a year.

## 2019-01-13 NOTE — Assessment & Plan Note (Signed)
A1c at goal.  Continue management per endocrinology.

## 2019-01-13 NOTE — Assessment & Plan Note (Signed)
Recommended over-the-counter famotidine. 

## 2019-01-13 NOTE — Assessment & Plan Note (Signed)
Check lipid panel. Continue simvastatin 20 mg daily. 

## 2019-01-13 NOTE — Progress Notes (Signed)
Chief Complaint:  David Maynard is a 68 y.o. male who presents today for his annual comprehensive physical exam.    Assessment/Plan:  Dyslipidemia associated with type 2 diabetes mellitus (HCC) Check lipid panel.  Continue simvastatin 20 mg daily.  Hypothyroidism Continue Synthroid per endocrinology.  OAB (overactive bladder) Continue management per urology.  ED (erectile dysfunction) Continue management per urology.  BPH  Continue management per urology.  Diabetes mellitus, type II (HCC) A1c at goal.  Continue management per endocrinology.  Gastroesophageal reflux disease Recommended over-the-counter famotidine.  Preventative Healthcare: Checkc CBC, CMET, TSH, lipid panel and PSA.   Patient Counseling(The following topics were reviewed and/or handout was given):  -Nutrition: Stressed importance of moderation in sodium/caffeine intake, saturated fat and cholesterol, caloric balance, sufficient intake of fresh fruits, vegetables, and fiber.  -Stressed the importance of regular exercise.   -Substance Abuse: Discussed cessation/primary prevention of tobacco, alcohol, or other drug use; driving or other dangerous activities under the influence; availability of treatment for abuse.   -Injury prevention: Discussed safety belts, safety helmets, smoke detector, smoking near bedding or upholstery.   -Sexuality: Discussed sexually transmitted diseases, partner selection, use of condoms, avoidance of unintended pregnancy and contraceptive alternatives.   -Dental health: Discussed importance of regular tooth brushing, flossing, and dental visits.  -Health maintenance and immunizations reviewed. Please refer to Health maintenance section.  Return to care in 1 year for next preventative visit.     Subjective:  HPI:  He has no acute complaints today.   Has been drinking more coffee lately and has noticed increasing reflux.  Worse after coffee and water.  Tries Mylanta which helps  with the symptoms but does not last for long periods of time.  No other treatments tried.  His stable, chronic medical conditions are outlined below:   # T2DM -Follows with endocrinology -On bromocriptine 2.5 mg daily, Clomid 25 mg every other day, WelChol 2500 mg daily, Victoza 1.8 mg daily, Metformin 1000 mg twice daily, Starlix 120 mg 3 times daily with meals, prandin 2mg  three times daily with meals, and Actos 45 mg daily  # Hypothyroidism -Follows with endocrinology - On synthroid 50mcg daily  # Dyslipidemia -On simvastatin 20 mg daily and WelChol  # BPH / ED / OAB -Follows with urology - On Cialis 20 mg daily and Myrbetriq 50 mg daily.  Lifestyle Diet: Balanced. Avoiding carbs.  Exercise: Playing tennis and golf weekly.   Depression screen PHQ 2/9 09/02/2018  Decreased Interest 0  Down, Depressed, Hopeless 0  PHQ - 2 Score 0  Some recent data might be hidden   There are no preventive care reminders to display for this patient.   ROS: Per HPI, otherwise a complete review of systems was negative.   PMH:  The following were reviewed and entered/updated in epic: Past Medical History:  Diagnosis Date  . Anemia    history of anemia  . 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 Depressive disorder, not elsewhere classified    none  . Diabetes mellitus type II   . Hypercholesterolemia   . Hypogonadism male   . Primary localized osteoarthritis of left knee 12/11/2017  . Primary localized osteoarthritis of right knee 10/23/2017  . 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 Thyroid disease   . Unspecified hypothyroidism    Patient Active Problem List   Diagnosis Date Noted  . Gastroesophageal reflux disease 01/13/2019  . BPH  01/08/2018  . ED (erectile dysfunction) 01/08/2018  . OAB (  overactive bladder) 01/08/2018  . History of bilateral knee replacement 12/11/2017  . Lumbar disc disease 09/22/2010  . Diabetes mellitus, type II ([place])   . Hypogonadism male 06/19/2007   . Dyslipidemia associated with type 2 diabetes mellitus ([place]) 06/19/2007  . Hypothyroidism 03/05/2007   Past Surgical History:  Procedure Laterality Date  . ESOPHAGOGASTRODUODENOSCOPY  06/16/2003  . INGUINAL HERNIA REPAIR     left  . JOINT REPLACEMENT    . KNEE ARTHROSCOPY     Left  . KNEE ARTHROSCOPY W/ ACL RECONSTRUCTION     left  . ROTATOR CUFF REPAIR     Right x2  . Stress Cardiolite  07/15/2000  . TOTAL KNEE ARTHROPLASTY Right 11/04/2017  . TOTAL KNEE ARTHROPLASTY Right 11/04/2017   Procedure: TOTAL KNEE ARTHROPLASTY;  Surgeon: Elsie Saas, MD;  Location: [place];  Service: Orthopedics;  Laterality: Right;  . TOTAL KNEE ARTHROPLASTY Left 12/23/2017  . TOTAL KNEE ARTHROPLASTY Left 12/23/2017   Procedure: TOTAL KNEE ARTHROPLASTY;  Surgeon: Elsie Saas, MD;  Location: [place];  Service: Orthopedics;  Laterality: Left;  Marland Kitchen VASECTOMY  05/2002    Family History  Problem Relation Age of Onset  . Breast cancer Neg Hx   . Celiac disease Neg Hx   . Cirrhosis Neg Hx   . Clotting disorder Neg Hx   . Colitis Neg Hx   . Colon cancer Neg Hx   . Colon polyps Neg Hx   . Crohn's disease Neg Hx   . Cystic fibrosis Neg Hx   . Diabetes Neg Hx   . Esophageal cancer Neg Hx   . Heart disease Neg Hx   . Hemochromatosis Neg Hx   . Inflammatory bowel disease Neg Hx   . Irritable bowel syndrome Neg Hx   . Kidney disease Neg Hx   . Liver cancer Neg Hx   . Liver disease Neg Hx   . Ovarian cancer Neg Hx   . Pancreatic cancer Neg Hx   . Prostate cancer Neg Hx   . Rectal cancer Neg Hx   . Stomach cancer Neg Hx   . Ulcerative colitis Neg Hx   . Uterine cancer Neg Hx   . Wilson's disease Neg Hx     Medications- reviewed and updated Current Outpatient Medications  Medication Sig Dispense Refill  . ACCU-CHEK GUIDE test strip USE TO CHECK BLOOD SUGAR 6  TIMES DAILY 600 strip 3  . bromocriptine (PARLODEL) 2.5 MG tablet TAKE 1 TABLET BY MOUTH  DAILY 90 tablet 3  . Calcium Carbonate (CALCIUM  600 PO) Take 1 tablet by mouth daily.    Marland Kitchen Cinnamon (HM CINNAMON) 500 MG capsule Take 3,000 mg by mouth 2 (two) times daily.    . clomiPHENE (CLOMID) 50 MG tablet Take 0.5 tablets (25 mg total) by mouth every other day. 23 tablet 3  . colesevelam (WELCHOL) 625 MG tablet Take 4 tablets (2,500 mg total) by mouth daily. 360 tablet 3  . docusate sodium (COLACE) 100 MG capsule Take 100 mg by mouth 2 (two) times daily.    Marland Kitchen EASY TOUCH PEN NEEDLES 32G X 6 MM MISC USE ONCE DAILY IN THE  MORNING 90 each 4  . Ferrous Sulfate Dried 45 MG TBCR Take 1 tablet by mouth 2 (two) times daily.    . Glucosamine-Chondroit-Vit C-Mn (GLUCOSAMINE CHONDR 1500 COMPLX PO) Take 2 tablets by mouth daily.    Marland Kitchen levothyroxine (SYNTHROID, LEVOTHROID) 50 MCG tablet Take 1 tablet (50 mcg total) by mouth daily before breakfast.  90 tablet 3  . liraglutide (VICTOZA) 18 MG/3ML SOPN Inject 0.3 mLs (1.8 mg total) into the skin daily. 9 mL 11  . metFORMIN (GLUCOPHAGE-XR) 500 MG 24 hr tablet TAKE 2 TABLETS BY MOUTH TWO TIMES DAILY 360 tablet 3  . mirabegron ER (MYRBETRIQ) 25 MG TB24 tablet Take 50 mg by mouth daily.    . Multiple Vitamin (MULTIVITAMIN) tablet Take 1 tablet by mouth daily.    . nateglinide (STARLIX) 120 MG tablet TAKE 1 TABLET(120 MG) BY MOUTH THREE TIMES DAILY WITH MEALS 270 tablet 3  . omega-3 acid ethyl esters (LOVAZA) 1 g capsule Take 2 capsules (2 g total) by mouth 2 (two) times daily. 360 capsule 3  . pioglitazone (ACTOS) 45 MG tablet TAKE 1 TABLET BY MOUTH  DAILY 90 tablet 3  . repaglinide (PRANDIN) 2 MG tablet Take 1 tablet (2 mg total) by mouth 3 (three) times daily before meals. 90 tablet 11  . simvastatin (ZOCOR) 20 MG tablet Take 0.5 tablets (10 mg total) by mouth at bedtime. 45 tablet 3  . tadalafil (ADCIRCA/CIALIS) 20 MG tablet Take 20 mg by mouth daily as needed for erectile dysfunction.    . Trospium Chloride 60 MG CP24     . Turmeric 500 MG CAPS Take 1,500 mg by mouth daily.     No current  facility-administered medications for this visit.    Allergies-reviewed and updated No Known Allergies  Social History   Socioeconomic History  . Marital status: Married    Spouse name: Not on file  . Number of children: Not on file  . Years of education: Not on file  . Highest education level: Not on file  Occupational History  . Not on file  Tobacco Use  . Smoking status: Never Smoker  . Smokeless tobacco: Never Used  Substance and Sexual Activity  . Alcohol use: Yes    Alcohol/week: 1.0 standard drinks    Types: 1 Cans of beer per week    Comment: weekly  . Drug use: No  . Sexual activity: Not on file  Other Topics Concern  . Not on file  Social History Narrative  . Not on file   Social Determinants of Health   Financial Resource Strain:   . Difficulty of Paying Living Expenses: Not on file  Food Insecurity:   . Worried About Programme researcher, broadcasting/film/video in the Last Year: Not on file  . Ran Out of Food in the Last Year: Not on file  Transportation Needs:   . Lack of Transportation (Medical): Not on file  . Lack of Transportation (Non-Medical): Not on file  Physical Activity:   . Days of Exercise per Week: Not on file  . Minutes of Exercise per Session: Not on file  Stress:   . Feeling of Stress : Not on file  Social Connections:   . Frequency of Communication with Friends and Family: Not on file  . Frequency of Social Gatherings with Friends and Family: Not on file  . Attends Religious Services: Not on file  . Active Member of Clubs or Organizations: Not on file  . Attends Banker Meetings: Not on file  . Marital Status: Not on file        Objective:  Physical Exam: BP 110/69   Pulse 73   Temp 97.7 F (36.5 C)   Ht 5\' 7"  (1.702 m)   Wt 141 lb 4 oz (64.1 kg)   SpO2 99%   BMI 22.12 kg/m  Body mass index is 22.12 kg/m. Wt Readings from Last 3 Encounters:  01/13/19 141 lb 4 oz (64.1 kg)  12/08/18 145 lb (65.8 kg)  09/09/18 146 lb 6.4 oz  (66.4 kg)   Gen: NAD, resting comfortably HEENT: TMs normal bilaterally. OP clear. No thyromegaly noted.  CV: RRR with no murmurs appreciated Pulm: NWOB, CTAB with no crackles, wheezes, or rhonchi GI: Normal bowel sounds present. Soft, Nontender, Nondistended. MSK: no edema, cyanosis, or clubbing noted Skin: warm, dry Neuro: CN2-12 grossly intact. Strength 5/5 in upper and lower extremities. Reflexes symmetric and intact bilaterally.  Psych: Normal affect and thought content     Trayce Caravello M. Jimmey Ralph, MD 01/13/2019 11:26 AM

## 2019-01-13 NOTE — Assessment & Plan Note (Signed)
Continue Synthroid per endocrinology.

## 2019-01-13 NOTE — Patient Instructions (Signed)
It was very nice to see you today!  Please try taking famotidine for your reflux.  Let me know if this does not work or if you would like to try prescription medication.  We will check blood work today.  Come back in 1 year for your next checkup, or sooner if needed.  Take care, Dr Jerline Pain  Please try these tips to maintain a healthy lifestyle:   Eat at least 3 REAL meals and 1-2 snacks per day.  Aim for no more than 5 hours between eating.  If you eat breakfast, please do so within one hour of getting up.    Each meal should contain half fruits/vegetables, one quarter protein, and one quarter carbs (no bigger than a computer mouse)   Cut down on sweet beverages. This includes juice, soda, and sweet tea.     Drink at least 1 glass of water with each meal and aim for at least 8 glasses per day   Exercise at least 150 minutes every week.   Preventive Care 103 Years and Older, Male Preventive care refers to lifestyle choices and visits with your health care provider that can promote health and wellness. This includes:  A yearly physical exam. This is also called an annual well check.  Regular dental and eye exams.  Immunizations.  Screening for certain conditions.  Healthy lifestyle choices, such as diet and exercise. What can I expect for my preventive care visit? Physical exam Your health care provider will check:  Height and weight. These may be used to calculate body mass index (BMI), which is a measurement that tells if you are at a healthy weight.  Heart rate and blood pressure.  Your skin for abnormal spots. Counseling Your health care provider may ask you questions about:  Alcohol, tobacco, and drug use.  Emotional well-being.  Home and relationship well-being.  Sexual activity.  Eating habits.  History of falls.  Memory and ability to understand (cognition).  Work and work Statistician. What immunizations do I need?  Influenza (flu)  vaccine  This is recommended every year. Tetanus, diphtheria, and pertussis (Tdap) vaccine  You may need a Td booster every 10 years. Varicella (chickenpox) vaccine  You may need this vaccine if you have not already been vaccinated. Zoster (shingles) vaccine  You may need this after age 24. Pneumococcal conjugate (PCV13) vaccine  One dose is recommended after age 61. Pneumococcal polysaccharide (PPSV23) vaccine  One dose is recommended after age 74. Measles, mumps, and rubella (MMR) vaccine  You may need at least one dose of MMR if you were born in 1957 or later. You may also need a second dose. Meningococcal conjugate (MenACWY) vaccine  You may need this if you have certain conditions. Hepatitis A vaccine  You may need this if you have certain conditions or if you travel or work in places where you may be exposed to hepatitis A. Hepatitis B vaccine  You may need this if you have certain conditions or if you travel or work in places where you may be exposed to hepatitis B. Haemophilus influenzae type b (Hib) vaccine  You may need this if you have certain conditions. You may receive vaccines as individual doses or as more than one vaccine together in one shot (combination vaccines). Talk with your health care provider about the risks and benefits of combination vaccines. What tests do I need? Blood tests  Lipid and cholesterol levels. These may be checked every 5 years, or more frequently depending  on your overall health.  Hepatitis C test.  Hepatitis B test. Screening  Lung cancer screening. You may have this screening every year starting at age 60 if you have a 30-pack-year history of smoking and currently smoke or have quit within the past 15 years.  Colorectal cancer screening. All adults should have this screening starting at age 69 and continuing until age 63. Your health care provider may recommend screening at age 15 if you are at increased risk. You will have  tests every 1-10 years, depending on your results and the type of screening test.  Prostate cancer screening. Recommendations will vary depending on your family history and other risks.  Diabetes screening. This is done by checking your blood sugar (glucose) after you have not eaten for a while (fasting). You may have this done every 1-3 years.  Abdominal aortic aneurysm (AAA) screening. You may need this if you are a current or former smoker.  Sexually transmitted disease (STD) testing. Follow these instructions at home: Eating and drinking  Eat a diet that includes fresh fruits and vegetables, whole grains, lean protein, and low-fat dairy products. Limit your intake of foods with high amounts of sugar, saturated fats, and salt.  Take vitamin and mineral supplements as recommended by your health care provider.  Do not drink alcohol if your health care provider tells you not to drink.  If you drink alcohol: ? Limit how much you have to 0-2 drinks a day. ? Be aware of how much alcohol is in your drink. In the [place] one drink equals one 12 oz bottle of beer (355 mL), one 5 oz glass of wine (148 mL), or one 1 oz glass of hard liquor (44 mL). Lifestyle  Take daily care of your teeth and gums.  Stay active. Exercise for at least 30 minutes on 5 or more days each week.  Do not use any products that contain nicotine or tobacco, such as cigarettes, e-cigarettes, and chewing tobacco. If you need help quitting, ask your health care provider.  If you are sexually active, practice safe sex. Use a condom or other form of protection to prevent STIs (sexually transmitted infections).  Talk with your health care provider about taking a low-dose aspirin or statin. What's next?  Visit your health care provider once a year for a well check visit.  Ask your health care provider how often you should have your eyes and teeth checked.  Stay up to date on all vaccines. This information is not  intended to replace advice given to you by your health care provider. Make sure you discuss any questions you have with your health care provider. Document Released: 02/04/2015 Document Revised: 01/02/2018 Document Reviewed: 01/02/2018 Elsevier Patient Education  2020 [employer].

## 2019-01-13 NOTE — Telephone Encounter (Signed)
Please advise 

## 2019-01-13 NOTE — Telephone Encounter (Signed)
Pt given lab results per notes of Dr Jerline Pain on 01/13/19. Pt verbalized understanding.

## 2019-01-14 ENCOUNTER — Other Ambulatory Visit: Payer: Self-pay | Admitting: Endocrinology

## 2019-01-14 ENCOUNTER — Other Ambulatory Visit: Payer: Self-pay

## 2019-01-14 MED ORDER — OMEPRAZOLE 40 MG PO CPDR: 40.0000 mg | DELAYED_RELEASE_CAPSULE | Freq: Every day | ORAL | 2 refills | Status: DC

## 2019-01-14 MED ORDER — LEVOTHYROXINE SODIUM 75 MCG PO TABS: 75.0000 ug | ORAL_TABLET | Freq: Every day | ORAL | 3 refills | Status: DC

## 2019-01-14 NOTE — Telephone Encounter (Signed)
Ok with me. Please place any necessary orders. 

## 2019-01-14 NOTE — Telephone Encounter (Signed)
Rx sent patient notified 

## 2019-01-17 ENCOUNTER — Other Ambulatory Visit: Payer: Self-pay | Admitting: Endocrinology

## 2019-01-17 DIAGNOSIS — E119 Type 2 diabetes mellitus without complications: Secondary | ICD-10-CM

## 2019-01-26 ENCOUNTER — Encounter: Payer: Self-pay | Admitting: Endocrinology

## 2019-01-26 ENCOUNTER — Encounter: Payer: Self-pay | Admitting: Family Medicine

## 2019-02-02 ENCOUNTER — Encounter: Payer: Self-pay | Admitting: Family Medicine

## 2019-02-06 ENCOUNTER — Other Ambulatory Visit: Payer: Self-pay

## 2019-02-06 MED ORDER — AMOXICILLIN 500 MG PO TABS: ORAL_TABLET | ORAL | 1 refills | Status: DC

## 2019-02-09 ENCOUNTER — Ambulatory Visit: Payer: Medicare Other | Admitting: Family Medicine

## 2019-02-12 ENCOUNTER — Encounter: Payer: Self-pay | Admitting: Endocrinology

## 2019-02-12 ENCOUNTER — Encounter: Payer: Self-pay | Admitting: Family Medicine

## 2019-02-17 ENCOUNTER — Other Ambulatory Visit: Payer: Self-pay | Admitting: Endocrinology

## 2019-02-17 ENCOUNTER — Other Ambulatory Visit: Payer: Self-pay | Admitting: Family Medicine

## 2019-03-19 ENCOUNTER — Other Ambulatory Visit: Payer: Self-pay

## 2019-03-19 ENCOUNTER — Encounter: Payer: Self-pay | Admitting: Endocrinology

## 2019-03-20 ENCOUNTER — Encounter: Payer: Self-pay | Admitting: Family Medicine

## 2019-03-23 ENCOUNTER — Encounter: Payer: Self-pay | Admitting: Endocrinology

## 2019-03-23 ENCOUNTER — Other Ambulatory Visit: Payer: Self-pay

## 2019-03-23 ENCOUNTER — Ambulatory Visit (INDEPENDENT_AMBULATORY_CARE_PROVIDER_SITE_OTHER): Payer: Medicare Other | Admitting: Endocrinology

## 2019-03-23 VITALS — BP 124/78 | HR 75 | Ht 67.0 in | Wt 142.4 lb

## 2019-03-23 DIAGNOSIS — E291 Testicular hypofunction: Secondary | ICD-10-CM

## 2019-03-23 DIAGNOSIS — E119 Type 2 diabetes mellitus without complications: Secondary | ICD-10-CM

## 2019-03-23 LAB — POCT GLYCOSYLATED HEMOGLOBIN (HGB A1C): Hemoglobin A1C: 5.9 % — AB (ref 4.0–5.6)

## 2019-03-23 LAB — TSH: TSH: 2.94 u[IU]/mL (ref 0.35–4.50)

## 2019-03-23 MED ORDER — REPAGLINIDE 2 MG PO TABS: ORAL_TABLET | ORAL | 3 refills | Status: DC

## 2019-03-23 NOTE — Progress Notes (Signed)
Subjective:    Patient ID: David Maynard, male    DOB: 16-Sep-1950, 69 y.o.   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   Dx'ed: 7672 Complications: none  Therapy: victoza + 6 oral meds.  DKA: never.  Severe hypoglycemia: never.  Pancreatitis: never.  Other: he has never taken insulin.    Interval history: pt states he feels well in general.  He says he is having to take extra doses of repaglinide.  He brings his meter with his cbg's which I have reviewed today.  cbg varies from 61-195.   Past Medical History:  Diagnosis Date  . Anemia    history of anemia  . 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 Depressive disorder, not elsewhere classified    none  . Diabetes mellitus type II   . Hypercholesterolemia   . Hypogonadism male   . Primary localized osteoarthritis of left knee 12/11/2017  . Primary localized osteoarthritis of right knee 10/23/2017  . 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 Thyroid disease   . Unspecified hypothyroidism     Past Surgical History:  Procedure Laterality Date  . ESOPHAGOGASTRODUODENOSCOPY  06/16/2003  . INGUINAL HERNIA REPAIR     left  . JOINT REPLACEMENT    . KNEE ARTHROSCOPY     Left  . KNEE ARTHROSCOPY W/ ACL RECONSTRUCTION     left  . ROTATOR CUFF REPAIR     Right x2  . Stress Cardiolite  07/15/2000  . TOTAL KNEE ARTHROPLASTY Right 11/04/2017  . TOTAL KNEE ARTHROPLASTY Right 11/04/2017   Procedure: TOTAL KNEE ARTHROPLASTY;  Surgeon: Elsie Saas, MD;  Location: [facility];  Service: Orthopedics;  Laterality: Right;  . TOTAL KNEE ARTHROPLASTY Left 12/23/2017  . TOTAL KNEE ARTHROPLASTY Left 12/23/2017   Procedure: TOTAL KNEE ARTHROPLASTY;  Surgeon: Elsie Saas, MD;  Location: [facility];  Service: Orthopedics;  Laterality: Left;  Marland Kitchen VASECTOMY  05/2002    Social History   Socioeconomic History  . Marital status: Married     Spouse name: Not on file  . Number of children: Not on file  . Years of education: Not on file  . Highest education level: Not on file  Occupational History  . Not on file  Tobacco Use  . Smoking status: Never Smoker  . Smokeless tobacco: Never Used  Substance and Sexual Activity  . Alcohol use: Yes    Alcohol/week: 1.0 standard drinks    Types: 1 Cans of beer per week    Comment: weekly  . Drug use: No  . Sexual activity: Not on file  Other Topics Concern  . Not on file  Social History Narrative  . Not on file   Social Determinants of Health   Financial Resource Strain:   . Difficulty of Paying Living Expenses: Not on file  Food Insecurity:   . Worried About Charity fundraiser in the Last Year: Not on file  . Ran Out of Food in the Last Year: Not on file  Transportation Needs:   . Lack of Transportation (Medical): Not on file  . Lack of Transportation (Non-Medical): Not on file  Physical Activity:   . Days of Exercise per Week: Not on file  . Minutes of Exercise per Session: Not on file  Stress:   . Feeling of Stress : Not on file  Social Connections:   . Frequency of Communication with Friends and Family: Not on file  . Frequency of Social Gatherings with Friends and Family: Not on file  . Attends Religious Services: Not on file  . Active Member of Clubs or Organizations: Not on file  . Attends Banker Meetings: Not on file  . Marital Status: Not on file  Intimate Partner Violence:   . Fear of Current or Ex-Partner: Not on file  . Emotionally Abused: Not on file  . Physically Abused: Not on file  . Sexually Abused: Not on file    Current Outpatient Medications on File Prior to Visit  Medication Sig Dispense Refill  . ACCU-CHEK GUIDE test strip USE TO CHECK BLOOD SUGAR 6  TIMES DAILY 600 strip 3  . amoxicillin (AMOXIL) 500 MG tablet Take 4 tablets by mouth as a single dose 1 hour prior to having dental work. 20 tablet 1  . bromocriptine (PARLODEL)  2.5 MG tablet TAKE 1 TABLET BY MOUTH  DAILY 90 tablet 3  . Calcium Carbonate (CALCIUM 600 PO) Take 1 tablet by mouth daily.    Marland Kitchen Cinnamon (HM CINNAMON) 500 MG capsule Take 3,000 mg by mouth 2 (two) times daily.    . clomiPHENE (CLOMID) 50 MG tablet TAKE ONE-HALF TABLET BY  MOUTH DAILY 45 tablet 0  . colesevelam (WELCHOL) 625 MG tablet TAKE 4 TABLETS BY MOUTH  DAILY 360 tablet 3  . docusate sodium (COLACE) 100 MG capsule Take 100 mg by mouth 2 (two) times daily.    Marland Kitchen EASY TOUCH PEN NEEDLES 32G X 6 MM MISC USE ONCE DAILY IN THE  MORNING 90 each 4  . Ferrous Sulfate Dried 45 MG TBCR Take 1 tablet by mouth 2 (two) times daily.    . Glucosamine-Chondroit-Vit C-Mn (GLUCOSAMINE CHONDR 1500 COMPLX PO) Take 2 tablets by mouth daily.    Marland Kitchen levothyroxine (SYNTHROID) 75 MCG tablet Take 1 tablet (75 mcg total) by mouth daily. 90 tablet 3  . liraglutide (VICTOZA) 18 MG/3ML SOPN Inject 0.3 mLs (1.8 mg total) into the skin daily. 9 mL 11  . metFORMIN (GLUCOPHAGE-XR) 500 MG 24 hr tablet TAKE 2 TABLETS BY MOUTH TWO TIMES DAILY 360 tablet 3  . mirabegron ER (MYRBETRIQ) 25 MG TB24 tablet Take 50 mg by mouth daily.    . Multiple Vitamin (MULTIVITAMIN) tablet Take 1 tablet by mouth daily.    . nateglinide (STARLIX) 120 MG tablet TAKE 1 TABLET(120 MG) BY MOUTH THREE TIMES DAILY WITH MEALS 270 tablet 3  . omega-3 acid ethyl esters (LOVAZA) 1 g capsule TAKE 2 CAPSULES BY MOUTH  TWICE DAILY 360 capsule 3  . pioglitazone (ACTOS) 45 MG tablet TAKE 1 TABLET BY MOUTH  DAILY 90 tablet 3  . simvastatin (ZOCOR) 20 MG tablet Take 0.5 tablets (10 mg total) by mouth at bedtime. 45 tablet 3  . tadalafil (ADCIRCA/CIALIS) 20 MG tablet Take 20 mg by mouth daily as needed for erectile dysfunction.    . Trospium Chloride 60 MG CP24     . Turmeric 500 MG CAPS Take 1,500 mg by mouth daily.    Marland Kitchen omeprazole (PRILOSEC) 40 MG capsule Take 1 capsule (40 mg total) by mouth daily. 30 capsule 2   No current facility-administered medications on  file prior to visit.    No Known Allergies  Family History  Problem Relation Age of Onset  . Breast cancer Neg Hx   . Celiac disease Neg Hx   . Cirrhosis Neg Hx   .  Clotting disorder Neg Hx   . Colitis Neg Hx   . Colon cancer Neg Hx   . Colon polyps Neg Hx   . Crohn's disease Neg Hx   . Cystic fibrosis Neg Hx   . Diabetes Neg Hx   . Esophageal cancer Neg Hx   . Heart disease Neg Hx   . Hemochromatosis Neg Hx   . Inflammatory bowel disease Neg Hx   . Irritable bowel syndrome Neg Hx   . Kidney disease Neg Hx   . Liver cancer Neg Hx   . Liver disease Neg Hx   . Ovarian cancer Neg Hx   . Pancreatic cancer Neg Hx   . Prostate cancer Neg Hx   . Rectal cancer Neg Hx   . Stomach cancer Neg Hx   . Ulcerative colitis Neg Hx   . Uterine cancer Neg Hx   . Wilson's disease Neg Hx     BP 124/78 (BP Location: Left Arm, Patient Position: Sitting, Cuff Size: Normal)   Pulse 75   Ht 5\' 7"  (1.702 m)   Wt 142 lb 6.4 oz (64.6 kg)   SpO2 99%   BMI 22.30 kg/m    Review of Systems Denies LOC    Objective:   Physical Exam VITAL SIGNS:  See vs page GENERAL: no distress Pulses: dorsalis pedis intact bilat.   MSK: no deformity of the feet CV: no leg edema, but there are bilat vv's Skin:  no ulcer on the feet.  normal color and temp on the feet. Neuro: sensation is intact to touch on the feet   Lab Results  Component Value Date   TSH 6.67 (H) 01/13/2019   Lab Results  Component Value Date   HGBA1C 5.9 (A) 03/23/2019   Lab Results  Component Value Date   CREATININE 0.88 01/13/2019   BUN 19 01/13/2019   NA 133 (L) 01/13/2019   K 4.6 01/13/2019   CL 98 01/13/2019   CO2 28 01/13/2019       Assessment & Plan:  Type 2 DM: he declines to change Victoza to another GLP.  Hypothyroidism: recheck today.  Low testosterone: bromocriptine rxs both this and DM.   Patient Instructions  I have sent a prescription to your pharmacy, to increase the repaglinide. check your blood  sugar 4 times a day: before the 3 meals, and at bedtime.  also check if you have symptoms of your blood sugar being too high or too low.  please keep a record of the readings and bring it to your next appointment here (or you can bring the meter itself).  You can write it on any piece of paper.  please call 01/15/2019 sooner if your blood sugar goes below 70, or if you have a lot of readings over 200.  Blood tests are requested for you today.  We'll let you know about the results.  Please come back for a follow-up appointment in 3-4 months.

## 2019-03-23 NOTE — Patient Instructions (Signed)
I have sent a prescription to your pharmacy, to increase the repaglinide. check your blood sugar 4 times a day: before the 3 meals, and at bedtime.  also check if you have symptoms of your blood sugar being too high or too low.  please keep a record of the readings and bring it to your next appointment here (or you can bring the meter itself).  You can write it on any piece of paper.  please call us sooner if your blood sugar goes below 70, or if you have a lot of readings over 200.  Blood tests are requested for you today.  We'll let you know about the results.  Please come back for a follow-up appointment in 3-4 months.

## 2019-03-25 LAB — TESTOSTERONE,FREE AND TOTAL
Testosterone, Free: 9.2 pg/mL (ref 6.6–18.1)
Testosterone: 631 ng/dL (ref 264–916)

## 2019-04-04 ENCOUNTER — Other Ambulatory Visit: Payer: Self-pay | Admitting: Endocrinology

## 2019-04-11 ENCOUNTER — Other Ambulatory Visit: Payer: Self-pay | Admitting: Family Medicine

## 2019-04-13 ENCOUNTER — Telehealth: Payer: Self-pay

## 2019-04-13 NOTE — Telephone Encounter (Signed)
  LAST APPOINTMENT DATE: 04/11/2019   NEXT APPOINTMENT DATE:@Visit  date not found  MEDICATION:omeprazole (PRILOSEC) 40 MG capsule(Expired   PHARMACY: [id number] - [place], [place] - [address] AT [facility] [address] Phone:  [phone]  Fax:  [phone]     Patient would like to pick up both of his prescriptions at the same time. Pharmacy sent over request 04/11/19. Pt requesting for it to be sent in today. Patient would like for a call when this prescription has been sent over. **Let patient know to contact pharmacy at the end of the day to make sure medication is ready. **  ** Please notify patient to allow 48-72 hours to process**  **Encourage patient to contact the pharmacy for refills or they can request refills through [facility]**  CLINICAL FILLS OUT ALL BELOW:   LAST REFILL:  QTY:  REFILL DATE:    OTHER COMMENTS:    Okay for refill?  Please advise

## 2019-04-22 ENCOUNTER — Other Ambulatory Visit: Payer: Self-pay | Admitting: Endocrinology

## 2019-04-30 ENCOUNTER — Encounter: Payer: Self-pay | Admitting: Endocrinology

## 2019-04-30 ENCOUNTER — Other Ambulatory Visit: Payer: Self-pay | Admitting: Endocrinology

## 2019-05-01 ENCOUNTER — Other Ambulatory Visit: Payer: Self-pay

## 2019-05-01 DIAGNOSIS — E119 Type 2 diabetes mellitus without complications: Secondary | ICD-10-CM

## 2019-05-01 MED ORDER — METFORMIN HCL ER 500 MG PO TB24: 1000.0000 mg | ORAL_TABLET | Freq: Two times a day (BID) | ORAL | Status: DC

## 2019-05-01 MED ORDER — METFORMIN HCL ER 500 MG PO TB24: ORAL_TABLET | ORAL | Status: DC

## 2019-05-12 ENCOUNTER — Other Ambulatory Visit: Payer: Self-pay | Admitting: Endocrinology

## 2019-05-12 DIAGNOSIS — E119 Type 2 diabetes mellitus without complications: Secondary | ICD-10-CM

## 2019-05-25 ENCOUNTER — Encounter: Payer: Self-pay | Admitting: Family Medicine

## 2019-05-25 ENCOUNTER — Other Ambulatory Visit: Payer: Self-pay | Admitting: *Deleted

## 2019-05-25 MED ORDER — OMEPRAZOLE 40 MG PO CPDR: DELAYED_RELEASE_CAPSULE | ORAL | 1 refills | Status: DC

## 2019-06-24 ENCOUNTER — Other Ambulatory Visit: Payer: Self-pay | Admitting: Endocrinology

## 2019-06-27 ENCOUNTER — Other Ambulatory Visit: Payer: Self-pay | Admitting: Family Medicine

## 2019-06-29 ENCOUNTER — Other Ambulatory Visit: Payer: Self-pay | Admitting: Endocrinology

## 2019-06-30 ENCOUNTER — Other Ambulatory Visit: Payer: Self-pay | Admitting: Endocrinology

## 2019-06-30 ENCOUNTER — Encounter: Payer: Self-pay | Admitting: Endocrinology

## 2019-06-30 DIAGNOSIS — E119 Type 2 diabetes mellitus without complications: Secondary | ICD-10-CM

## 2019-06-30 MED ORDER — LEVOTHYROXINE SODIUM 75 MCG PO TABS: 75.0000 ug | ORAL_TABLET | Freq: Every day | ORAL | 0 refills | Status: DC

## 2019-07-13 ENCOUNTER — Ambulatory Visit (INDEPENDENT_AMBULATORY_CARE_PROVIDER_SITE_OTHER): Payer: Medicare Other | Admitting: Endocrinology

## 2019-07-13 ENCOUNTER — Other Ambulatory Visit: Payer: Self-pay

## 2019-07-13 ENCOUNTER — Encounter: Payer: Self-pay | Admitting: Endocrinology

## 2019-07-13 VITALS — BP 120/70 | HR 80 | Ht 67.0 in | Wt 138.2 lb

## 2019-07-13 DIAGNOSIS — E119 Type 2 diabetes mellitus without complications: Secondary | ICD-10-CM

## 2019-07-13 LAB — POCT GLYCOSYLATED HEMOGLOBIN (HGB A1C): Hemoglobin A1C: 6 % — AB (ref 4.0–5.6)

## 2019-07-13 NOTE — Patient Instructions (Addendum)
check your blood sugar 4 times a day: before the 3 meals, and at bedtime.  also check if you have symptoms of your blood sugar being too high or too low.  please keep a record of the readings and bring it to your next appointment here (or you can bring the meter itself).  You can write it on any piece of paper.  please call us sooner if your blood sugar goes below 70, or if you have a lot of readings over 200.  Please continue the same medications.   Please come back for a follow-up appointment in 3-4 months.

## 2019-07-13 NOTE — Progress Notes (Signed)
Subjective:    Patient ID: David Maynard, male    DOB: Jan 04, 1951, 69 y.o.   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   Dx'ed: 2008 Complications: none  Therapy: victoza + 6 oral meds.   DKA: never.  Severe hypoglycemia: never.  Pancreatitis: never.  Other: he has never taken insulin.    Interval history: pt states he feels well in general.   He brings his meter with his cbg's which I have reviewed today.  cbg varies from 62-167.  He seldom has hypoglycemia, and these episodes are mild Past Medical History:  Diagnosis Date  . Anemia    history of anemia  . 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 Depressive disorder, not elsewhere classified    none  . Diabetes mellitus type II   . Hypercholesterolemia   . Hypogonadism male   . Primary localized osteoarthritis of left knee 12/11/2017  . Primary localized osteoarthritis of right knee 10/23/2017  . 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 Thyroid disease   . Unspecified hypothyroidism     Past Surgical History:  Procedure Laterality Date  . ESOPHAGOGASTRODUODENOSCOPY  06/16/2003  . INGUINAL HERNIA REPAIR     left  . JOINT REPLACEMENT    . KNEE ARTHROSCOPY     Left  . KNEE ARTHROSCOPY W/ ACL RECONSTRUCTION     left  . ROTATOR CUFF REPAIR     Right x2  . Stress Cardiolite  07/15/2000  . TOTAL KNEE ARTHROPLASTY Right 11/04/2017  . TOTAL KNEE ARTHROPLASTY Right 11/04/2017   Procedure: TOTAL KNEE ARTHROPLASTY;  Surgeon: Salvatore Marvel, MD;  Location: [facility] OR;  Service: Orthopedics;  Laterality: Right;  . TOTAL KNEE ARTHROPLASTY Left 12/23/2017  . TOTAL KNEE ARTHROPLASTY Left 12/23/2017   Procedure: TOTAL KNEE ARTHROPLASTY;  Surgeon: Salvatore Marvel, MD;  Location: [facility] OR;  Service: Orthopedics;  Laterality: Left;  Marland Kitchen VASECTOMY  05/2002    Social History   Socioeconomic History  . Marital status: Married     Spouse name: Not on file  . Number of children: Not on file  . Years of education: Not on file  . Highest education level: Not on file  Occupational History  . Not on file  Tobacco Use  . Smoking status: Never Smoker  . Smokeless tobacco: Never Used  Vaping Use  . Vaping Use: Never used  Substance and Sexual Activity  . Alcohol use: Yes    Alcohol/week: 1.0 standard drink    Types: 1 Cans of beer per week    Comment: weekly  . Drug use: No  . Sexual activity: Not on file  Other Topics Concern  . Not on file  Social History Narrative  . Not on file   Social Determinants of Health   Financial Resource Strain:   . Difficulty of Paying Living Expenses:   Food Insecurity:   . Worried About Programme researcher, broadcasting/film/video in the Last Year:   . Barista in the Last Year:   Transportation Needs:   . Freight forwarder (Medical):   Marland Kitchen Lack of Transportation (Non-Medical):   Physical Activity:   . Days of Exercise per Week:   . Minutes of Exercise per Session:   Stress:   . Feeling of Stress :   Social Connections:   . Frequency of Communication  with Friends and Family:   . Frequency of Social Gatherings with Friends and Family:   . Attends Religious Services:   . Active Member of Clubs or Organizations:   . Attends Archivist Meetings:   Marland Kitchen Marital Status:   Intimate Partner Violence:   . Fear of Current or Ex-Partner:   . Emotionally Abused:   Marland Kitchen Physically Abused:   . Sexually Abused:     Current Outpatient Medications on File Prior to Visit  Medication Sig Dispense Refill  . ACCU-CHEK GUIDE test strip USE TO CHECK BLOOD SUGAR 6  TIMES DAILY 600 strip 3  . amoxicillin (AMOXIL) 500 MG tablet Take 4 tablets by mouth as a single dose 1 hour prior to having dental work. 20 tablet 1  . bromocriptine (PARLODEL) 2.5 MG tablet TAKE 1 TABLET BY MOUTH  DAILY 90 tablet 3  . Calcium Carbonate (CALCIUM 600 PO) Take 1 tablet by mouth daily.    Marland Kitchen Cinnamon (HM CINNAMON) 500  MG capsule Take 3,000 mg by mouth 2 (two) times daily.    . clomiPHENE (CLOMID) 50 MG tablet TAKE ONE-HALF TABLET BY  MOUTH DAILY 45 tablet 0  . colesevelam (WELCHOL) 625 MG tablet TAKE 4 TABLETS BY MOUTH  DAILY 360 tablet 3  . docusate sodium (COLACE) 100 MG capsule Take 100 mg by mouth 2 (two) times daily.    Marland Kitchen EASY TOUCH PEN NEEDLES 32G X 6 MM MISC USE ONCE DAILY IN THE  MORNING 90 each 4  . Ferrous Sulfate Dried 45 MG TBCR Take 1 tablet by mouth 2 (two) times daily.    . Glucosamine-Chondroit-Vit C-Mn (GLUCOSAMINE CHONDR 1500 COMPLX PO) Take 2 tablets by mouth daily.    Marland Kitchen levothyroxine (SYNTHROID) 75 MCG tablet Take 1 tablet (75 mcg total) by mouth daily. 90 tablet 0  . liraglutide (VICTOZA) 18 MG/3ML SOPN Inject 0.3 mLs (1.8 mg total) into the skin daily. 9 mL 11  . metFORMIN (GLUCOPHAGE-XR) 500 MG 24 hr tablet TAKE 2 TABLETS BY MOUTH  TWICE DAILY 360 tablet 0  . mirabegron ER (MYRBETRIQ) 25 MG TB24 tablet Take 50 mg by mouth daily.    . Multiple Vitamin (MULTIVITAMIN) tablet Take 1 tablet by mouth daily.    Marland Kitchen omega-3 acid ethyl esters (LOVAZA) 1 g capsule TAKE 2 CAPSULES BY MOUTH  TWICE DAILY 360 capsule 3  . omeprazole (PRILOSEC) 40 MG capsule TAKE 1 CAPSULE(40 MG) BY MOUTH DAILY 90 capsule 1  . pioglitazone (ACTOS) 45 MG tablet TAKE 1 TABLET BY MOUTH  DAILY 90 tablet 0  . repaglinide (PRANDIN) 2 MG tablet TAKE 1 TABLET(2 MG) BY MOUTH THREE TIMES DAILY BEFORE MEALS 90 tablet 2  . simvastatin (ZOCOR) 20 MG tablet TAKE ONE-HALF TABLET BY  MOUTH AT BEDTIME 45 tablet 3  . tadalafil (ADCIRCA/CIALIS) 20 MG tablet Take 20 mg by mouth daily as needed for erectile dysfunction.    . Trospium Chloride 60 MG CP24     . Turmeric 500 MG CAPS Take 1,500 mg by mouth daily.     No current facility-administered medications on file prior to visit.    No Known Allergies  Family History  Problem Relation Age of Onset  . Breast cancer Neg Hx   . Celiac disease Neg Hx   . Cirrhosis Neg Hx   . Clotting  disorder Neg Hx   . Colitis Neg Hx   . Colon cancer Neg Hx   . Colon polyps Neg Hx   . Crohn's disease  Neg Hx   . Cystic fibrosis Neg Hx   . Diabetes Neg Hx   . Esophageal cancer Neg Hx   . Heart disease Neg Hx   . Hemochromatosis Neg Hx   . Inflammatory bowel disease Neg Hx   . Irritable bowel syndrome Neg Hx   . Kidney disease Neg Hx   . Liver cancer Neg Hx   . Liver disease Neg Hx   . Ovarian cancer Neg Hx   . Pancreatic cancer Neg Hx   . Prostate cancer Neg Hx   . Rectal cancer Neg Hx   . Stomach cancer Neg Hx   . Ulcerative colitis Neg Hx   . Uterine cancer Neg Hx   . Wilson's disease Neg Hx     BP 120/70   Pulse 80   Ht 5\' 7"  (1.702 m)   Wt 138 lb 3.2 oz (62.7 kg)   SpO2 98%   BMI 21.65 kg/m    Review of Systems Denies LOC    Objective:   Physical Exam VITAL SIGNS:  See vs page GENERAL: no distress Pulses: dorsalis pedis intact bilat.   MSK: no deformity of the feet CV: no leg edema, but there are bilat vv's.   Skin:  no ulcer on the feet.  normal color and temp on the feet. Neuro: sensation is intact to touch on the feet  Lab Results  Component Value Date   HGBA1C 6.0 (A) 07/13/2019       Assessment & Plan:  Type 2 DM: well-controlled.   hypoglycemia, due to repaglinide. We'll follow for now, as it is mild and infrequent.  Patient Instructions  check your blood sugar 4 times a day: before the 3 meals, and at bedtime.  also check if you have symptoms of your blood sugar being too high or too low.  please keep a record of the readings and bring it to your next appointment here (or you can bring the meter itself).  You can write it on any piece of paper.  please call 07/15/2019 sooner if your blood sugar goes below 70, or if you have a lot of readings over 200.  Please continue the same medications.   Please come back for a follow-up appointment in 3-4 months.

## 2019-08-20 ENCOUNTER — Other Ambulatory Visit: Payer: Self-pay | Admitting: Endocrinology

## 2019-08-29 ENCOUNTER — Encounter: Payer: Self-pay | Admitting: Endocrinology

## 2019-08-31 ENCOUNTER — Other Ambulatory Visit: Payer: Self-pay

## 2019-08-31 DIAGNOSIS — E119 Type 2 diabetes mellitus without complications: Secondary | ICD-10-CM

## 2019-08-31 MED ORDER — EASY TOUCH PEN NEEDLES 32G X 6 MM MISC: 1.0000 | Freq: Every day | 0 refills | Status: DC

## 2019-09-03 ENCOUNTER — Ambulatory Visit (INDEPENDENT_AMBULATORY_CARE_PROVIDER_SITE_OTHER): Payer: Medicare Other

## 2019-09-03 DIAGNOSIS — Z0000 Encounter for general adult medical examination without abnormal findings: Secondary | ICD-10-CM

## 2019-09-03 NOTE — Patient Instructions (Addendum)
[name] , Thank you for taking time to come for your Medicare Wellness Visit. I appreciate your ongoing commitment to your health goals. Please review the following plan we discussed and let me know if I can assist you in the future.   Screening recommendations/referrals: Colonoscopy: Done 04/13/10 Recommended yearly ophthalmology/optometry visit for glaucoma screening and checkup Recommended yearly dental visit for hygiene and checkup  Vaccinations: Influenza vaccine: Up to date Pneumococcal vaccine: Up to date Tdap vaccine: Up to date Shingles vaccine: Completed   5/7 &09/03/16 Covid-19: Completed 01/14/19 & 02/12/19  Advanced directives:tCopy in chart Conditions/risks identified: Get A1C down below 5.8  Next appointment: Follow up in one year for your annual wellness visit.   Preventive Care 17 Years and Older, Male Preventive care refers to lifestyle choices and visits with your health care provider that can promote health and wellness. What does preventive care include?  A yearly physical exam. This is also called an annual well check.  Dental exams once or twice a year.  Routine eye exams. Ask your health care provider how often you should have your eyes checked.  Personal lifestyle choices, including:  Daily care of your teeth and gums.  Regular physical activity.  Eating a healthy diet.  Avoiding tobacco and drug use.  Limiting alcohol use.  Practicing safe sex.  Taking low doses of aspirin every day.  Taking vitamin and mineral supplements as recommended by your health care provider. What happens during an annual well check? The services and screenings done by your health care provider during your annual well check will depend on your age, overall health, lifestyle risk factors, and family history of disease. Counseling  Your health care provider may ask you questions about your:  Alcohol use.  Tobacco use.  Drug use.  Emotional well-being.  Home  and relationship well-being.  Sexual activity.  Eating habits.  History of falls.  Memory and ability to understand (cognition).  Work and work Astronomer. Screening  You may have the following tests or measurements:  Height, weight, and BMI.  Blood pressure.  Lipid and cholesterol levels. These may be checked every 5 years, or more frequently if you are over 8 years old.  Skin check.  Lung cancer screening. You may have this screening every year starting at age 63 if you have a 30-pack-year history of smoking and currently smoke or have quit within the past 15 years.  Fecal occult blood test (FOBT) of the stool. You may have this test every year starting at age 76.  Flexible sigmoidoscopy or colonoscopy. You may have a sigmoidoscopy every 5 years or a colonoscopy every 10 years starting at age 30.  Prostate cancer screening. Recommendations will vary depending on your family history and other risks.  Hepatitis C blood test.  Hepatitis B blood test.  Sexually transmitted disease (STD) testing.  Diabetes screening. This is done by checking your blood sugar (glucose) after you have not eaten for a while (fasting). You may have this done every 1-3 years.  Abdominal aortic aneurysm (AAA) screening. You may need this if you are a current or former smoker.  Osteoporosis. You may be screened starting at age 75 if you are at high risk. Talk with your health care provider about your test results, treatment options, and if necessary, the need for more tests. Vaccines  Your health care provider may recommend certain vaccines, such as:  Influenza vaccine. This is recommended every year.  Tetanus, diphtheria, and acellular pertussis (Tdap, Td)  vaccine. You may need a Td booster every 10 years.  Zoster vaccine. You may need this after age 58.  Pneumococcal 13-valent conjugate (PCV13) vaccine. One dose is recommended after age 65.  Pneumococcal polysaccharide (PPSV23) vaccine.  One dose is recommended after age 16. Talk to your health care provider about which screenings and vaccines you need and how often you need them. This information is not intended to replace advice given to you by your health care provider. Make sure you discuss any questions you have with your health care provider. Document Released: 02/04/2015 Document Revised: 09/28/2015 Document Reviewed: 11/09/2014 Elsevier Interactive Patient Education  2017 [place] Prevention in the Home Falls can cause injuries. They can happen to people of all ages. There are many things you can do to make your home safe and to help prevent falls. What can I do on the outside of my home?  Regularly fix the edges of walkways and driveways and fix any cracks.  Remove anything that might make you trip as you walk through a door, such as a raised step or threshold.  Trim any bushes or trees on the path to your home.  Use bright outdoor lighting.  Clear any walking paths of anything that might make someone trip, such as rocks or tools.  Regularly check to see if handrails are loose or broken. Make sure that both sides of any steps have handrails.  Any raised decks and porches should have guardrails on the edges.  Have any leaves, snow, or ice cleared regularly.  Use sand or salt on walking paths during winter.  Clean up any spills in your garage right away. This includes oil or grease spills. What can I do in the bathroom?  Use night lights.  Install grab bars by the toilet and in the tub and shower. Do not use towel bars as grab bars.  Use non-skid mats or decals in the tub or shower.  If you need to sit down in the shower, use a plastic, non-slip stool.  Keep the floor dry. Clean up any water that spills on the floor as soon as it happens.  Remove soap buildup in the tub or shower regularly.  Attach bath mats securely with double-sided non-slip rug tape.  Do not have throw rugs and other  things on the floor that can make you trip. What can I do in the bedroom?  Use night lights.  Make sure that you have a light by your bed that is easy to reach.  Do not use any sheets or blankets that are too big for your bed. They should not hang down onto the floor.  Have a firm chair that has side arms. You can use this for support while you get dressed.  Do not have throw rugs and other things on the floor that can make you trip. What can I do in the kitchen?  Clean up any spills right away.  Avoid walking on wet floors.  Keep items that you use a lot in easy-to-reach places.  If you need to reach something above you, use a strong step stool that has a grab bar.  Keep electrical cords out of the way.  Do not use floor polish or wax that makes floors slippery. If you must use wax, use non-skid floor wax.  Do not have throw rugs and other things on the floor that can make you trip. What can I do with my stairs?  Do not leave  any items on the stairs.  Make sure that there are handrails on both sides of the stairs and use them. Fix handrails that are broken or loose. Make sure that handrails are as long as the stairways.  Check any carpeting to make sure that it is firmly attached to the stairs. Fix any carpet that is loose or worn.  Avoid having throw rugs at the top or bottom of the stairs. If you do have throw rugs, attach them to the floor with carpet tape.  Make sure that you have a light switch at the top of the stairs and the bottom of the stairs. If you do not have them, ask someone to add them for you. What else can I do to help prevent falls?  Wear shoes that:  Do not have high heels.  Have rubber bottoms.  Are comfortable and fit you well.  Are closed at the toe. Do not wear sandals.  If you use a stepladder:  Make sure that it is fully opened. Do not climb a closed stepladder.  Make sure that both sides of the stepladder are locked into place.  Ask  someone to hold it for you, if possible.  Clearly mark and make sure that you can see:  Any grab bars or handrails.  First and last steps.  Where the edge of each step is.  Use tools that help you move around (mobility aids) if they are needed. These include:  Canes.  Walkers.  Scooters.  Crutches.  Turn on the lights when you go into a dark area. Replace any light bulbs as soon as they burn out.  Set up your furniture so you have a clear path. Avoid moving your furniture around.  If any of your floors are uneven, fix them.  If there are any pets around you, be aware of where they are.  Review your medicines with your doctor. Some medicines can make you feel dizzy. This can increase your chance of falling. Ask your doctor what other things that you can do to help prevent falls. This information is not intended to replace advice given to you by your health care provider. Make sure you discuss any questions you have with your health care provider. Document Released: 11/04/2008 Document Revised: 06/16/2015 Document Reviewed: 02/12/2014 Elsevier Interactive Patient Education  2017 [employer].

## 2019-09-03 NOTE — Progress Notes (Signed)
Virtual Visit via Telephone Note  I connected with  David Maynard on 09/03/19 at 11:00 AM EDT by telephone and verified that I am speaking with the correct person using two identifiers.  Medicare Annual Wellness visit completed telephonically due to Covid-19 pandemic.   Persons participating in this call: This Health Coach and this patient.   Location: Patient: Home Provider: Office   I discussed the limitations, risks, security and privacy concerns of performing an evaluation and management service by telephone and the availability of in person appointments. The patient expressed understanding and agreed to proceed.  Unable to perform video visit due to video visit attempted and failed and/or patient does not have video capability.   Some vital signs may be absent or patient reported.   David Schleinina H Alechia Lezama, LPN    Subjective:   David Maynard is a 69 y.o. male who presents for Medicare Annual/Subsequent preventive examination.  Review of Systems     Cardiac Risk Factors include: diabetes mellitus;male gender;dyslipidemia     Objective:    There were no vitals filed for this visit. There is no height or weight on file to calculate BMI.  Advanced Directives 09/03/2019 09/02/2018 12/24/2017 12/23/2017 12/12/2017 11/04/2017 10/24/2017  Does Patient Have a Medical Advance Directive? Yes Yes Yes - Yes Yes Yes  Type of Advance Directive Healthcare Power of Attorney Living will;Healthcare Power of State Street Corporationttorney Healthcare Power of [place]Attorney;Living will - Healthcare Power of [place]orney;Living will Healthcare Power of [place]Attorney;Living will Healthcare Power of [place]orney;Living will  Does patient want to make changes to medical advance directive? - No - Patient declined No - Patient declined - No - Patient declined No - Patient declined -  Copy of Healthcare Power of Attorney in Chart? Yes - validated most recent copy scanned in chart (See row information) Yes - validated most recent copy scanned in chart  (See row information) - Yes - validated most recent copy scanned in chart (See row information) No - copy requested No - copy requested No - copy requested    Current Medications (verified) Outpatient Encounter Medications as of 09/03/2019  Medication Sig  . ACCU-CHEK GUIDE test strip USE TO CHECK BLOOD SUGAR 6  TIMES DAILY  . amoxicillin (AMOXIL) 500 MG tablet Take 4 tablets by mouth as a single dose 1 hour prior to having dental work.  . bromocriptine (PARLODEL) 2.5 MG tablet TAKE 1 TABLET BY MOUTH  DAILY  . Calcium Carbonate (CALCIUM 600 PO) Take 1 tablet by mouth daily.  Marland Kitchen. Cinnamon (HM CINNAMON) 500 MG capsule Take 3,000 mg by mouth 2 (two) times daily.  . clomiPHENE (CLOMID) 50 MG tablet TAKE ONE-HALF TABLET BY  MOUTH DAILY  . colesevelam (WELCHOL) 625 MG tablet TAKE 4 TABLETS BY MOUTH  DAILY  . docusate sodium (COLACE) 100 MG capsule Take 100 mg by mouth 2 (two) times daily.  . Ferrous Sulfate Dried 45 MG TBCR Take 1 tablet by mouth 2 (two) times daily.  . Glucosamine-Chondroit-Vit C-Mn (GLUCOSAMINE CHONDR 1500 COMPLX PO) Take 2 tablets by mouth daily.  . Insulin Pen Needle (EASY TOUCH PEN NEEDLES) 32G X 6 MM MISC 1 each by Other route daily. E11.9  . levothyroxine (SYNTHROID) 75 MCG tablet TAKE 1 TABLET BY MOUTH  DAILY  . liraglutide (VICTOZA) 18 MG/3ML SOPN Inject 0.3 mLs (1.8 mg total) into the skin daily.  . metFORMIN (GLUCOPHAGE-XR) 500 MG 24 hr tablet TAKE 2 TABLETS BY MOUTH  TWICE DAILY  . Multiple Vitamin (MULTIVITAMIN) tablet Take 1  tablet by mouth daily.  Marland Kitchen omega-3 acid ethyl esters (LOVAZA) 1 g capsule TAKE 2 CAPSULES BY MOUTH  TWICE DAILY  . omeprazole (PRILOSEC) 40 MG capsule TAKE 1 CAPSULE(40 MG) BY MOUTH DAILY  . pioglitazone (ACTOS) 45 MG tablet TAKE 1 TABLET BY MOUTH  DAILY  . repaglinide (PRANDIN) 2 MG tablet TAKE 1 TABLET(2 MG) BY MOUTH THREE TIMES DAILY BEFORE MEALS  . simvastatin (ZOCOR) 20 MG tablet TAKE ONE-HALF TABLET BY  MOUTH AT BEDTIME  . tadalafil  (ADCIRCA/CIALIS) 20 MG tablet Take 20 mg by mouth daily as needed for erectile dysfunction.  . Turmeric 500 MG CAPS Take 1,500 mg by mouth daily.  . mirabegron ER (MYRBETRIQ) 25 MG TB24 tablet Take 50 mg by mouth daily.  . Trospium Chloride 60 MG CP24    No facility-administered encounter medications on file as of 09/03/2019.    Allergies (verified) Patient has no known allergies.   History: Past Medical History:  Diagnosis Date  . Anemia    history of anemia  . 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 Depressive disorder, not elsewhere classified    none  . Diabetes mellitus type II   . Hypercholesterolemia   . Hypogonadism male   . Primary localized osteoarthritis of left knee 12/11/2017  . Primary localized osteoarthritis of right knee 10/23/2017  . 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 Thyroid disease   . Unspecified hypothyroidism    Past Surgical History:  Procedure Laterality Date  . ESOPHAGOGASTRODUODENOSCOPY  06/16/2003  . INGUINAL HERNIA REPAIR     left  . JOINT REPLACEMENT    . KNEE ARTHROSCOPY     Left  . KNEE ARTHROSCOPY W/ ACL RECONSTRUCTION     left  . ROTATOR CUFF REPAIR     Right x2  . Stress Cardiolite  07/15/2000  . TOTAL KNEE ARTHROPLASTY Right 11/04/2017  . TOTAL KNEE ARTHROPLASTY Right 11/04/2017   Procedure: TOTAL KNEE ARTHROPLASTY;  Surgeon: Salvatore Marvel, MD;  Location: [facility] OR;  Service: Orthopedics;  Laterality: Right;  . TOTAL KNEE ARTHROPLASTY Left 12/23/2017  . TOTAL KNEE ARTHROPLASTY Left 12/23/2017   Procedure: TOTAL KNEE ARTHROPLASTY;  Surgeon: Salvatore Marvel, MD;  Location: [facility] OR;  Service: Orthopedics;  Laterality: Left;  Marland Kitchen VASECTOMY  05/2002   Family History  Problem Relation Age of Onset  . Breast cancer Neg Hx   . Celiac disease Neg Hx   . Cirrhosis Neg Hx   . Clotting disorder Neg Hx   . Colitis Neg Hx   . Colon cancer Neg Hx   . Colon polyps Neg Hx   . Crohn's disease Neg Hx   . Cystic fibrosis Neg Hx   . Diabetes Neg Hx    . Esophageal cancer Neg Hx   . Heart disease Neg Hx   . Hemochromatosis Neg Hx   . Inflammatory bowel disease Neg Hx   . Irritable bowel syndrome Neg Hx   . Kidney disease Neg Hx   . Liver cancer Neg Hx   . Liver disease Neg Hx   . Ovarian cancer Neg Hx   . Pancreatic cancer Neg Hx   . Prostate cancer Neg Hx   . Rectal cancer Neg Hx   . Stomach cancer Neg Hx   . Ulcerative colitis Neg Hx   . Uterine cancer Neg Hx   . Wilson's disease Neg Hx    Social History   Socioeconomic History  . Marital status: Married    Spouse name: Not  on file  . Number of children: Not on file  . Years of education: Not on file  . Highest education level: Not on file  Occupational History  . Occupation: Retired  Tobacco Use  . Smoking status: Never Smoker  . Smokeless tobacco: Never Used  Vaping Use  . Vaping Use: Never used  Substance and Sexual Activity  . Alcohol use: Yes    Alcohol/week: 1.0 standard drink    Types: 1 Cans of beer per week    Comment: weekly  . Drug use: No  . Sexual activity: Not on file  Other Topics Concern  . Not on file  Social History Narrative  . Not on file   Social Determinants of Health   Financial Resource Strain: Low Risk   . Difficulty of Paying Living Expenses: Not hard at all  Food Insecurity: No Food Insecurity  . Worried About Programme researcher, broadcasting/film/video in the Last Year: Never true  . Ran Out of Food in the Last Year: Never true  Transportation Needs: No Transportation Needs  . Lack of Transportation (Medical): No  . Lack of Transportation (Non-Medical): No  Physical Activity: Insufficiently Active  . Days of Exercise per Week: 5 days  . Minutes of Exercise per Session: 20 min  Stress: No Stress Concern Present  . Feeling of Stress : Not at all  Social Connections: Socially Integrated  . Frequency of Communication with Friends and Family: More than three times a week  . Frequency of Social Gatherings with Friends and Family: More than three times  a week  . Attends Religious Services: More than 4 times per year  . Active Member of Clubs or Organizations: Yes  . Attends Banker Meetings: 1 to 4 times per year  . Marital Status: Married    Tobacco Counseling Counseling given: Not Answered   Clinical Intake:  Pre-visit preparation completed: Yes  Pain : No/denies pain     BMI - recorded: 21.65 Nutritional Status: BMI of 19-24  Normal Nutritional Risks: None Diabetes: Yes CBG done?: Yes (148) CBG resulted in Enter/ Edit results?: No Did pt. bring in CBG monitor from home?: No  How often do you need to have someone help you when you read instructions, pamphlets, or other written materials from your doctor or pharmacy?: 1 - Never  Diabetic?Yes  Interpreter Needed?: No  Information entered by :: Lanier Ensign, LPN   Activities of Daily Living In your present state of health, do you have any difficulty performing the following activities: 09/03/2019  Hearing? N  Vision? N  Difficulty concentrating or making decisions? N  Walking or climbing stairs? N  Dressing or bathing? N  Doing errands, shopping? N  Preparing Food and eating ? N  Using the Toilet? N  In the past six months, have you accidently leaked urine? Y  Comment urgency  Do you have problems with loss of bowel control? N  Managing your Medications? N  Managing your Finances? N  Housekeeping or managing your Housekeeping? N  Some recent data might be hidden    Patient Care Team: Ardith Dark, MD as PCP - General (Family Medicine) Barnett Abu, MD as Attending Physician (Neurosurgery) Romero Belling, MD as Consulting Physician (Endocrinology) Salvatore Marvel, MD as Consulting Physician (Orthopedic Surgery) Clois Dupes, DMD as Consulting Physician (Dentistry) [facility]ists[facility] as Consulting Physician (Orthopedic Surgery) Hillery Aldo, NP as Nurse Practitioner (Nurse Practitioner) Nelson Chimes, MD as  Consulting Physician (Ophthalmology)  Indicate any recent Medical Services you may have received from other than [facility] providers in the past year (date may be approximate).     Assessment:   This is a routine wellness examination for [place].  Hearing/Vision screen  Hearing Screening             Right ear:           Left ear:           Comments: Pt wears hearing aids since 2020  Vision Screening Comments: Follows up with Dr Shawna Orleans at [place] office  Dietary issues and exercise activities discussed: Current Exercise Habits: Home exercise routine, Type of exercise: walking, Time (Minutes): 30 (plays golf and tennis regulary), Frequency (Times/Week): 5, Weekly Exercise (Minutes/Week): 150  Goals    . Patient Stated     Return to being able to play tennis and golf     . Patient Stated     Get A1c down 5.8 or lower      Depression Screen PHQ 2/9 Scores 09/03/2019 09/02/2018 01/08/2018 01/24/2016  PHQ - 2 Score 0 0 0 0    Fall Risk Fall Risk  09/03/2019 01/13/2019 09/02/2018 04/28/2018 01/24/2016  Falls in the past year? 0 0 1 0 No  Number falls in past yr: 0 - 0 - -  Injury with Fall? 0 - 1 - -  Risk for fall due to : - - Impaired balance/gait - -  Follow up Falls prevention discussed - Education provided - -    Any stairs in or around the home? Yes  If so, are there any without handrails? No  Home free of loose throw rugs in walkways, pet beds, electrical cords, etc? Yes  Adequate lighting in your home to reduce risk of falls? Yes   ASSISTIVE DEVICES UTILIZED TO PREVENT FALLS:  Life alert? No  Use of a cane, walker or w/c? No  Grab bars in the bathroom? No  Shower chair or bench in shower? No  Elevated toilet seat or a handicapped toilet? No   TIMED UP AND GO:  Was the test performed? No .    Cognitive Function:     6CIT Screen 09/03/2019 09/02/2018  What Year? 0 points 0 points  What month? 0 points 0 points  What  time? - 0 points  Count back from 20 0 points 0 points  Months in reverse 0 points 0 points  Repeat phrase 0 points 0 points  Total Score - 0    Immunizations Immunization History  Administered Date(s) Administered  . Fluad Quad(high Dose 65+) 12/08/2018  . Influenza Split 12/10/2011  . Influenza Whole 11/22/2009  . Influenza, High Dose Seasonal PF 10/13/2015, 12/03/2016, 10/22/2017  . Influenza-Unspecified 11/10/2013  . Moderna SARS-COVID-2 Vaccination 01/14/2019, 02/12/2019  . Pneumococcal Conjugate-13 11/28/2015  . Pneumococcal Polysaccharide-23 03/29/2009, 12/03/2016  . Tdap 11/09/2013  . Zoster 01/22/2010  . Zoster Recombinat (Shingrix) 05/28/2016, 09/03/2016    TDAP status: Up to date Flu Vaccine status: Up to date Pneumococcal vaccine status: Up to date Covid-19 vaccine status: Completed vaccines  Qualifies for Shingles Vaccine? Yes   Zostavax completed Yes   Shingrix Completed?: Yes  Screening Tests Health Maintenance  Topic Date Due  . URINE MICROALBUMIN  04/15/2019  . INFLUENZA VACCINE  08/23/2019  . HEMOGLOBIN A1C  01/12/2020  . OPHTHALMOLOGY EXAM  03/16/2020  . COLONOSCOPY  04/12/2020  . FOOT EXAM  07/12/2020  . TETANUS/TDAP  11/10/2023  . COVID-19 Vaccine  Completed  .  Hepatitis C Screening  Completed  . PNA vac Low Risk Adult  Completed    Health Maintenance  Health Maintenance Due  Topic Date Due  . URINE MICROALBUMIN  04/15/2019  . INFLUENZA VACCINE  08/23/2019    Colorectal cancer screening: Completed 04/13/10. Repeat every 10 years  Additional Screening:  Hepatitis C Screening: Completed 12/08/15  Vision Screening: Recommended annual ophthalmology exams for early detection of glaucoma and other disorders of the eye. Is the patient up to date with their annual eye exam?  Yes  Who is the provider or what is the name of the office in which the patient attends annual eye exams? Dr Shawna Orleans    Dental Screening: Recommended annual  dental exams for proper oral hygiene  Community Resource Referral / Chronic Care Management: CRR required this visit?  No   CCM required this visit?  No      Plan:     I have personally reviewed and noted the following in the patient's chart:   . Medical and social history . Use of alcohol, tobacco or illicit drugs  . Current medications and supplements . Functional ability and status . Nutritional status . Physical activity . Advanced directives . List of other physicians . Hospitalizations, surgeries, and ER visits in previous 12 months . Vitals . Screenings to include cognitive, depression, and falls . Referrals and appointments  In addition, I have reviewed and discussed with patient certain preventive protocols, quality metrics, and best practice recommendations. A written personalized care plan for preventive services as well as general preventive health recommendations were provided to patient.     David Schlein, LPN   [id number]   Nurse Notes: None

## 2019-09-07 ENCOUNTER — Encounter: Payer: Self-pay | Admitting: Endocrinology

## 2019-09-07 ENCOUNTER — Encounter: Payer: Self-pay | Admitting: Family Medicine

## 2019-09-07 ENCOUNTER — Other Ambulatory Visit: Payer: Self-pay | Admitting: Endocrinology

## 2019-09-07 ENCOUNTER — Other Ambulatory Visit: Payer: Self-pay | Admitting: Family Medicine

## 2019-09-07 DIAGNOSIS — E119 Type 2 diabetes mellitus without complications: Secondary | ICD-10-CM

## 2019-09-08 ENCOUNTER — Other Ambulatory Visit: Payer: Self-pay

## 2019-09-08 DIAGNOSIS — E119 Type 2 diabetes mellitus without complications: Secondary | ICD-10-CM

## 2019-09-08 MED ORDER — METFORMIN HCL ER 500 MG PO TB24: 1000.0000 mg | ORAL_TABLET | Freq: Two times a day (BID) | ORAL | 3 refills | Status: DC

## 2019-09-08 MED ORDER — CLOMIPHENE CITRATE 50 MG PO TABS: 25.0000 mg | ORAL_TABLET | Freq: Every day | ORAL | 3 refills | Status: DC

## 2019-09-22 ENCOUNTER — Encounter: Payer: Self-pay | Admitting: Endocrinology

## 2019-09-22 ENCOUNTER — Encounter: Payer: Self-pay | Admitting: Family Medicine

## 2019-09-23 ENCOUNTER — Telehealth (INDEPENDENT_AMBULATORY_CARE_PROVIDER_SITE_OTHER): Payer: Medicare Other | Admitting: Family Medicine

## 2019-09-23 ENCOUNTER — Encounter: Payer: Self-pay | Admitting: Family Medicine

## 2019-09-23 ENCOUNTER — Other Ambulatory Visit: Payer: Self-pay

## 2019-09-23 ENCOUNTER — Telehealth: Payer: Self-pay

## 2019-09-23 DIAGNOSIS — U071 COVID-19: Secondary | ICD-10-CM

## 2019-09-23 DIAGNOSIS — E1169 Type 2 diabetes mellitus with other specified complication: Secondary | ICD-10-CM | POA: Diagnosis not present

## 2019-09-23 DIAGNOSIS — E119 Type 2 diabetes mellitus without complications: Secondary | ICD-10-CM | POA: Diagnosis not present

## 2019-09-23 DIAGNOSIS — E785 Hyperlipidemia, unspecified: Secondary | ICD-10-CM | POA: Diagnosis not present

## 2019-09-23 NOTE — Telephone Encounter (Signed)
David Maynard is wanting to be scheduled with Dr. Jimmey Ralph virtually. I told him that he does not have anything available til Tuesday. David Maynard was not pleased and did not want to see a different provider without me asking Dr. Jimmey Ralph first if that was okay- if he thought it was okay for David Maynard to see a different provider.

## 2019-09-23 NOTE — Progress Notes (Signed)
Virtual Visit via Video Note  Subjective  CC:  Chief Complaint  Patient presents with  . Covid Exposure    Tested positive for covid yesterday.     Same day acute visit; PCP not available. New pt to me. Chart reviewed.  Reviewed recent mychart messages.   I connected with [name] on 09/23/19 at 11:00 AM EDT by a video enabled telemedicine application and verified that I am speaking with the correct person using two identifiers. Location patient: Home Location provider: [facility] [address], Office Persons participating in the virtual visit: Jan, [place], MD Adela Glimpse CMA  I discussed the limitations of evaluation and management by telemedicine and the availability of in person appointments. The patient expressed understanding and agreed to proceed. HPI: David Maynard is a 69 y.o. male who was contacted today to address the problems listed above in the chief complaint. . 69 year old with type 2 diabetes and hyperlipidemia presents history of URI symptoms.  He sent to my chart messages explaining the course.  Started with nasal congestion and mild cough, then low-grade fevers.  He has had some malaise but no myalgias, loss of taste or smell, productive cough, shortness of breath, chest pain or abdominal pain.  He has been fully vaccinated.  He did at home test yesterday was positive for Covid.  He has been in quarantine.  Taking over-the-counter nighttime any daytime cough/cold medicine.  His fever was 101.8 last night.  This morning it was 99.6.  During this visit up to 99.8.  He denies any hypoglycemic symptoms.  His appetite is low but he is staying hydrated.  Again no vomiting.  His wife has tested negative.  He is a candidate for monoclonal antibody infusion due to his high risk comorbid conditions.  He is interested in getting this done.  Assessment  1. COVID-19 virus infection   2. Type 2 diabetes mellitus without complication,  without long-term current use of insulin (HCC)   3. Dyslipidemia associated with type 2 diabetes mellitus (HCC)      Plan   Covid infection: Status is stable.  Continues to have fevers and mild malaise.  Over-the-counter medications are controlling his symptoms fairly well.  Will refer to antibiotic infusion clinic.  All questions answered.  COVID home monitoring system ordered.  He understands he should quarantine for total 10 days and until he is without symptoms for 24 hours.  We discussed holding Prandin if appetite is poor.  He continues to check his sugars regularly.  I discussed the assessment and treatment plan with the patient. The patient was provided an opportunity to ask questions and all were answered. The patient agreed with the plan and demonstrated an understanding of the instructions.   The patient was advised to call back or seek an in-person evaluation if the symptoms worsen or if the condition fails to improve as anticipated. Follow up: As needed 02/01/2020  No orders of the defined types were placed in this encounter.     I reviewed the patients updated PMH, FH, and SocHx.    Patient Active Problem List   Diagnosis Date Noted  . Gastroesophageal reflux disease 01/13/2019  . BPH  01/08/2018  . ED (erectile dysfunction) 01/08/2018  . OAB (overactive bladder) 01/08/2018  . History of bilateral knee replacement 12/11/2017  . Lumbar disc disease 09/22/2010  . Diabetes mellitus, type II (HCC)   . Hypogonadism male 06/19/2007  . Dyslipidemia  associated with type 2 diabetes mellitus (HCC) 06/19/2007  . Hypothyroidism 03/05/2007   Current Meds  Medication Sig  . ACCU-CHEK GUIDE test strip USE TO CHECK BLOOD SUGAR 6  TIMES DAILY  . amoxicillin (AMOXIL) 500 MG tablet Take 4 tablets by mouth as a single dose 1 hour prior to having dental work.  . bromocriptine (PARLODEL) 2.5 MG tablet TAKE 1 TABLET BY MOUTH  DAILY  . Calcium Carbonate (CALCIUM 600 PO) Take 1 tablet by  mouth daily.  Marland Kitchen Cinnamon (HM CINNAMON) 500 MG capsule Take 3,000 mg by mouth 2 (two) times daily.  . clomiPHENE (CLOMID) 50 MG tablet Take 0.5 tablets (25 mg total) by mouth daily.  . colesevelam (WELCHOL) 625 MG tablet TAKE 4 TABLETS BY MOUTH  DAILY  . docusate sodium (COLACE) 100 MG capsule Take 100 mg by mouth 2 (two) times daily.  . Ferrous Sulfate Dried 45 MG TBCR Take 1 tablet by mouth 2 (two) times daily.  . Glucosamine-Chondroit-Vit C-Mn (GLUCOSAMINE CHONDR 1500 COMPLX PO) Take 2 tablets by mouth daily.  . Insulin Pen Needle (EASY TOUCH PEN NEEDLES) 32G X 6 MM MISC 1 each by Other route daily. E11.9  . levothyroxine (SYNTHROID) 75 MCG tablet TAKE 1 TABLET BY MOUTH  DAILY  . liraglutide (VICTOZA) 18 MG/3ML SOPN Inject 0.3 mLs (1.8 mg total) into the skin every morning.  . metFORMIN (GLUCOPHAGE-XR) 500 MG 24 hr tablet Take 2 tablets (1,000 mg total) by mouth 2 (two) times daily.  . mirabegron ER (MYRBETRIQ) 25 MG TB24 tablet Take 50 mg by mouth daily.  . Multiple Vitamin (MULTIVITAMIN) tablet Take 1 tablet by mouth daily.  Marland Kitchen omega-3 acid ethyl esters (LOVAZA) 1 g capsule TAKE 2 CAPSULES BY MOUTH  TWICE DAILY  . omeprazole (PRILOSEC) 40 MG capsule TAKE 1 CAPSULE BY MOUTH  DAILY  . pioglitazone (ACTOS) 45 MG tablet TAKE 1 TABLET BY MOUTH  DAILY  . repaglinide (PRANDIN) 2 MG tablet TAKE 1 TABLET(2 MG) BY MOUTH THREE TIMES DAILY BEFORE MEALS  . simvastatin (ZOCOR) 20 MG tablet TAKE ONE-HALF TABLET BY  MOUTH AT BEDTIME  . tadalafil (ADCIRCA/CIALIS) 20 MG tablet Take 20 mg by mouth daily as needed for erectile dysfunction.  . Trospium Chloride 60 MG CP24   . Turmeric 500 MG CAPS Take 1,500 mg by mouth daily.    Allergies: Patient has No Known Allergies. Family History: Patient family history is not on file. Social History:  Patient  reports that he has never smoked. He has never used smokeless tobacco. He reports current alcohol use of about 1.0 standard drink of alcohol per week. He  reports that he does not use drugs.  Review of Systems: Constitutional: Positive for fever malaise or anorexia Cardiovascular: negative for chest pain Respiratory: negative for SOB or persistent cough Gastrointestinal: negative for abdominal pain  OBJECTIVE Vitals: There were no vitals taken for this visit.  99.6 during visit General: no acute distress , A&Ox3, appears well No cough, no respiratory distress.  Normal speech.  Willow Ora, MD

## 2019-09-23 NOTE — Telephone Encounter (Signed)
See below

## 2019-09-23 NOTE — Telephone Encounter (Signed)
Not sure what he needs to be seen for but it is fine for him to see another provider for an acute issue.   Katina Degree. Jimmey Ralph, MD 09/23/2019 9:16 AM

## 2019-09-23 NOTE — Telephone Encounter (Signed)
Pt schedule

## 2019-09-23 NOTE — Telephone Encounter (Signed)
It is fine for him to see another provider 

## 2019-09-24 ENCOUNTER — Other Ambulatory Visit: Payer: Self-pay | Admitting: Internal Medicine

## 2019-09-24 ENCOUNTER — Encounter: Payer: Self-pay | Admitting: Nurse Practitioner

## 2019-09-24 ENCOUNTER — Ambulatory Visit (HOSPITAL_COMMUNITY)
Admission: RE | Admit: 2019-09-24 | Discharge: 2019-09-24 | Disposition: A | Discharge status: Home / Self Care | Payer: Medicare Other | Source: Ambulatory Visit | Attending: Pulmonary Disease | Admitting: Pulmonary Disease

## 2019-09-24 ENCOUNTER — Other Ambulatory Visit (HOSPITAL_COMMUNITY): Payer: Self-pay | Admitting: Nurse Practitioner

## 2019-09-24 DIAGNOSIS — U071 COVID-19: Secondary | ICD-10-CM

## 2019-09-24 DIAGNOSIS — Z23 Encounter for immunization: Secondary | ICD-10-CM | POA: Insufficient documentation

## 2019-09-24 DIAGNOSIS — E119 Type 2 diabetes mellitus without complications: Secondary | ICD-10-CM

## 2019-09-24 MED ORDER — METHYLPREDNISOLONE SODIUM SUCC 125 MG IJ SOLR: 125.0000 mg | Freq: Once | INTRAMUSCULAR | Status: DC | PRN

## 2019-09-24 MED ORDER — ALBUTEROL SULFATE HFA 108 (90 BASE) MCG/ACT IN AERS: 2.0000 | INHALATION_SPRAY | Freq: Once | RESPIRATORY_TRACT | Status: DC | PRN

## 2019-09-24 MED ORDER — EPINEPHRINE 0.3 MG/0.3ML IJ SOAJ: 0.3000 mg | Freq: Once | INTRAMUSCULAR | Status: DC | PRN

## 2019-09-24 MED ORDER — FAMOTIDINE IN NACL 20-0.9 MG/50ML-% IV SOLN: 20.0000 mg | Freq: Once | INTRAVENOUS | Status: DC | PRN

## 2019-09-24 MED ORDER — DIPHENHYDRAMINE HCL 50 MG/ML IJ SOLN: 50.0000 mg | Freq: Once | INTRAMUSCULAR | Status: DC | PRN

## 2019-09-24 MED ORDER — SODIUM CHLORIDE 0.9 % IV SOLN: INTRAVENOUS | Status: DC | PRN

## 2019-09-24 MED ORDER — SODIUM CHLORIDE 0.9 % IV SOLN
1200.0000 mg | Freq: Once | INTRAVENOUS | Status: AC
  Administered 2019-09-24: 1200 mg via INTRAVENOUS
  Filled 2019-09-24: qty 10

## 2019-09-24 NOTE — Progress Notes (Signed)
  Diagnosis: COVID-19  Physician: Dr. Patrick Wright  Procedure: Covid Infusion Clinic Med: casirivimab\imdevimab infusion - Provided patient with casirivimab\imdevimab fact sheet for patients, parents and caregivers prior to infusion.  Complications: No immediate complications noted.  Discharge: Discharged home   Trishia Cuthrell 09/24/2019   

## 2019-09-24 NOTE — Progress Notes (Signed)
I connected by phone with David Maynard on 09/24/2019 at 11:42 AM to discuss the potential use of an new treatment for mild to moderate COVID-19 viral infection in non-hospitalized patients.  This patient is a 69 y.o. male that meets the FDA criteria for Emergency Use Authorization of casirivimab\imdevimab.  Has a (+) direct SARS-CoV-2 viral test result  Has mild or moderate COVID-19   Is ? 69 years of age and weighs ? 40 kg  Is NOT hospitalized due to COVID-19  Is NOT requiring oxygen therapy or requiring an increase in baseline oxygen flow rate due to COVID-19  Is within 10 days of symptom onset  Has at least one of the high risk factor(s) for progression to severe COVID-19 and/or hospitalization as defined in EUA.  Specific high risk criteria : Diabetes, age.   Sx onset 8/26. Positive home test. Vaccinated.   I have spoken and communicated the following to the patient or parent/caregiver:  1. FDA has authorized the emergency use of casirivimab\imdevimab for the treatment of mild to moderate COVID-19 in adults and pediatric patients with positive results of direct SARS-CoV-2 viral testing who are 58 years of age and older weighing at least 40 kg, and who are at high risk for progressing to severe COVID-19 and/or hospitalization.  2. The significant known and potential risks and benefits of casirivimab\imdevimab, and the extent to which such potential risks and benefits are unknown.  3. Information on available alternative treatments and the risks and benefits of those alternatives, including clinical trials.  4. Patients treated with casirivimab\imdevimab should continue to self-isolate and use infection control measures (e.g., wear mask, isolate, social distance, avoid sharing personal items, clean and disinfect "high touch" surfaces, and frequent handwashing) according to CDC guidelines.   5. The patient or parent/caregiver has the option to accept or refuse casirivimab\imdevimab  .  After reviewing this information with the patient, The patient agreed to proceed with receiving casirivimab\imdevimab infusion and will be provided a copy of the Fact sheet prior to receiving the infusion.Consuello Masse, DNP, AGNP-C [phone] (Infusion Center Hotline)

## 2019-09-24 NOTE — Discharge Instructions (Signed)

## 2019-09-24 NOTE — Telephone Encounter (Signed)
Patient had appt yesterday with Dr. Mardelle Matte to discuss.

## 2019-09-24 NOTE — Telephone Encounter (Signed)
Patient is following up about this. patient states he would like this done ASAP before the holiday and would like a phone call when we figure out when this will be happening  [phone]

## 2019-09-26 ENCOUNTER — Encounter (HOSPITAL_COMMUNITY): Payer: Self-pay

## 2019-09-26 ENCOUNTER — Emergency Department (HOSPITAL_COMMUNITY): Payer: Medicare Other

## 2019-09-26 ENCOUNTER — Inpatient Hospital Stay (HOSPITAL_COMMUNITY)
Admission: EM | Admit: 2019-09-26 | Discharge: 2019-10-01 | DRG: 177 | Disposition: A | Discharge status: Home / Self Care | Payer: Medicare Other | Attending: Student | Admitting: Student

## 2019-09-26 ENCOUNTER — Other Ambulatory Visit: Payer: Self-pay

## 2019-09-26 DIAGNOSIS — T380X5A Adverse effect of glucocorticoids and synthetic analogues, initial encounter: Secondary | ICD-10-CM | POA: Diagnosis not present

## 2019-09-26 DIAGNOSIS — Z79899 Other long term (current) drug therapy: Secondary | ICD-10-CM

## 2019-09-26 DIAGNOSIS — F329 Major depressive disorder, single episode, unspecified: Secondary | ICD-10-CM | POA: Diagnosis present

## 2019-09-26 DIAGNOSIS — U071 COVID-19: Principal | ICD-10-CM

## 2019-09-26 DIAGNOSIS — E785 Hyperlipidemia, unspecified: Secondary | ICD-10-CM | POA: Diagnosis present

## 2019-09-26 DIAGNOSIS — R0602 Shortness of breath: Secondary | ICD-10-CM | POA: Diagnosis not present

## 2019-09-26 DIAGNOSIS — N400 Enlarged prostate without lower urinary tract symptoms: Secondary | ICD-10-CM | POA: Diagnosis present

## 2019-09-26 DIAGNOSIS — E291 Testicular hypofunction: Secondary | ICD-10-CM | POA: Diagnosis present

## 2019-09-26 DIAGNOSIS — J9601 Acute respiratory failure with hypoxia: Secondary | ICD-10-CM

## 2019-09-26 DIAGNOSIS — Z7984 Long term (current) use of oral hypoglycemic drugs: Secondary | ICD-10-CM

## 2019-09-26 DIAGNOSIS — E1165 Type 2 diabetes mellitus with hyperglycemia: Secondary | ICD-10-CM | POA: Diagnosis not present

## 2019-09-26 DIAGNOSIS — Z96653 Presence of artificial knee joint, bilateral: Secondary | ICD-10-CM | POA: Diagnosis present

## 2019-09-26 DIAGNOSIS — E1169 Type 2 diabetes mellitus with other specified complication: Secondary | ICD-10-CM | POA: Diagnosis present

## 2019-09-26 DIAGNOSIS — M170 Bilateral primary osteoarthritis of knee: Secondary | ICD-10-CM | POA: Diagnosis present

## 2019-09-26 DIAGNOSIS — R0682 Tachypnea, not elsewhere classified: Secondary | ICD-10-CM

## 2019-09-26 DIAGNOSIS — J189 Pneumonia, unspecified organism: Secondary | ICD-10-CM | POA: Diagnosis present

## 2019-09-26 DIAGNOSIS — E039 Hypothyroidism, unspecified: Secondary | ICD-10-CM | POA: Diagnosis present

## 2019-09-26 DIAGNOSIS — E119 Type 2 diabetes mellitus without complications: Secondary | ICD-10-CM

## 2019-09-26 DIAGNOSIS — Z79890 Hormone replacement therapy (postmenopausal): Secondary | ICD-10-CM

## 2019-09-26 DIAGNOSIS — J1282 Pneumonia due to coronavirus disease 2019: Secondary | ICD-10-CM | POA: Diagnosis present

## 2019-09-26 LAB — CBC WITH DIFFERENTIAL/PLATELET
Abs Immature Granulocytes: 0.03 10*3/uL (ref 0.00–0.07)
Basophils Absolute: 0 10*3/uL (ref 0.0–0.1)
Basophils Relative: 0 %
Eosinophils Absolute: 0.3 10*3/uL (ref 0.0–0.5)
Eosinophils Relative: 3 %
HCT: 41.3 % (ref 39.0–52.0)
Hemoglobin: 13.4 g/dL (ref 13.0–17.0)
Immature Granulocytes: 0 %
Lymphocytes Relative: 9 %
Lymphs Abs: 0.8 10*3/uL (ref 0.7–4.0)
MCH: 29.2 pg (ref 26.0–34.0)
MCHC: 32.4 g/dL (ref 30.0–36.0)
MCV: 90 fL (ref 80.0–100.0)
Monocytes Absolute: 0.3 10*3/uL (ref 0.1–1.0)
Monocytes Relative: 3 %
Neutro Abs: 6.9 10*3/uL (ref 1.7–7.7)
Neutrophils Relative %: 85 %
Platelets: 196 10*3/uL (ref 150–400)
RBC: 4.59 MIL/uL (ref 4.22–5.81)
RDW: 13.7 % (ref 11.5–15.5)
WBC: 8.2 10*3/uL (ref 4.0–10.5)
nRBC: 0 % (ref 0.0–0.2)

## 2019-09-26 MED ORDER — ALBUTEROL SULFATE HFA 108 (90 BASE) MCG/ACT IN AERS
4.0000 | INHALATION_SPRAY | RESPIRATORY_TRACT | Status: AC
  Filled 2019-09-26: qty 6.7

## 2019-09-26 MED ORDER — DEXAMETHASONE SODIUM PHOSPHATE 10 MG/ML IJ SOLN
10.0000 mg | Freq: Once | INTRAMUSCULAR | Status: AC
  Administered 2019-09-26: 10 mg via INTRAVENOUS
  Filled 2019-09-26: qty 1

## 2019-09-26 MED ORDER — ASPIRIN EC 325 MG PO TBEC
325.0000 mg | DELAYED_RELEASE_TABLET | Freq: Once | ORAL | Status: AC
  Administered 2019-09-27: 325 mg via ORAL
  Filled 2019-09-26 (×2): qty 1

## 2019-09-26 NOTE — ED Provider Notes (Addendum)
[facility]-EMERGENCY DEPT Provider Note   CSN: [id number] Arrival date & time: 09/26/19  2103     History Chief Complaint  Patient presents with  . Low Pulse Oximetry    COVID+    David Maynard is a 69 y.o. male.  HPI    69 year old male comes in a chief complaint of low pulse ox. Patient reports that he started developing symptoms 10 days ago on Thursday with cough and URI-like symptoms. He tested himself last weekend and was positive. He received monoclonal antibodies on Thursday, 2 days ago. His family brought him pulse ox. Today they noted that his O2 sats were 85% and he came to the ER.  At arrival his O2 sats were 88% but subsequently his oxygen saturations have been over 90%. Patient reports that he has noted some elevated respiratory rate and shortness of breath with exertion. He continues to have cough. No chest pain. Patient denies any nausea, vomiting and his fevers have now resolved.  Past Medical History:  Diagnosis Date  . Anemia    history of anemia  . 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 Depressive disorder, not elsewhere classified    none  . Diabetes mellitus type II   . Hypercholesterolemia   . Hypogonadism male   . Primary localized osteoarthritis of left knee 12/11/2017  . Primary localized osteoarthritis of right knee 10/23/2017  . 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 Thyroid disease   . Unspecified hypothyroidism     Patient Active Problem List   Diagnosis Date Noted  . Pneumonia 09/27/2019  . Acute hypoxemic respiratory failure (HCC) 09/27/2019  . Gastroesophageal reflux disease 01/13/2019  . BPH  01/08/2018  . ED (erectile dysfunction) 01/08/2018  . OAB (overactive bladder) 01/08/2018  . History of bilateral knee replacement 12/11/2017  . Lumbar disc disease 09/22/2010  . Diabetes mellitus, type II (HCC)   . Hypogonadism male 06/19/2007  . Dyslipidemia associated with type 2 diabetes mellitus (HCC)  06/19/2007  . Hypothyroidism 03/05/2007    Past Surgical History:  Procedure Laterality Date  . ESOPHAGOGASTRODUODENOSCOPY  06/16/2003  . INGUINAL HERNIA REPAIR     left  . JOINT REPLACEMENT    . KNEE ARTHROSCOPY     Left  . KNEE ARTHROSCOPY W/ ACL RECONSTRUCTION     left  . ROTATOR CUFF REPAIR     Right x2  . Stress Cardiolite  07/15/2000  . TOTAL KNEE ARTHROPLASTY Right 11/04/2017  . TOTAL KNEE ARTHROPLASTY Right 11/04/2017   Procedure: TOTAL KNEE ARTHROPLASTY;  Surgeon: Salvatore Marvel, MD;  Location: [facility] OR;  Service: Orthopedics;  Laterality: Right;  . TOTAL KNEE ARTHROPLASTY Left 12/23/2017  . TOTAL KNEE ARTHROPLASTY Left 12/23/2017   Procedure: TOTAL KNEE ARTHROPLASTY;  Surgeon: Salvatore Marvel, MD;  Location: [facility] OR;  Service: Orthopedics;  Laterality: Left;  Marland Kitchen VASECTOMY  05/2002       Family History  Problem Relation Age of Onset  . Breast cancer Neg Hx   . Celiac disease Neg Hx   . Cirrhosis Neg Hx   . Clotting disorder Neg Hx   . Colitis Neg Hx   . Colon cancer Neg Hx   . Colon polyps Neg Hx   . Crohn's disease Neg Hx   . Cystic fibrosis Neg Hx   . Diabetes Neg Hx   . Esophageal cancer Neg Hx   . Heart disease Neg Hx   . Hemochromatosis Neg Hx   . Inflammatory bowel disease Neg  Hx   . Irritable bowel syndrome Neg Hx   . Kidney disease Neg Hx   . Liver cancer Neg Hx   . Liver disease Neg Hx   . Ovarian cancer Neg Hx   . Pancreatic cancer Neg Hx   . Prostate cancer Neg Hx   . Rectal cancer Neg Hx   . Stomach cancer Neg Hx   . Ulcerative colitis Neg Hx   . Uterine cancer Neg Hx   . Wilson's disease Neg Hx     Social History   Tobacco Use  . Smoking status: Never Smoker  . Smokeless tobacco: Never Used  Vaping Use  . Vaping Use: Never used  Substance Use Topics  . Alcohol use: Yes    Alcohol/week: 1.0 standard drink    Types: 1 Cans of beer per week    Comment: weekly  . Drug use: No    Home Medications Prior to Admission medications     Medication Sig Start Date End Date Taking? Authorizing Provider  ACCU-CHEK GUIDE test strip USE TO CHECK BLOOD SUGAR 6  TIMES DAILY 06/30/19  Yes Romero Belling, MD  bromocriptine (PARLODEL) 2.5 MG tablet TAKE 1 TABLET BY MOUTH  DAILY 11/25/18  Yes Romero Belling, MD  Calcium Carbonate (CALCIUM 600 PO) Take 1 tablet by mouth daily.   Yes [provider]  Cinnamon (HM CINNAMON) 500 MG capsule Take 3,000 mg by mouth 2 (two) times daily.   Yes [provider]  clomiPHENE (CLOMID) 50 MG tablet Take 0.5 tablets (25 mg total) by mouth daily. 09/08/19  Yes Romero Belling, MD  colesevelam [facility]) 625 MG tablet TAKE 4 TABLETS BY MOUTH  DAILY 02/17/19  Yes Romero Belling, MD  docusate sodium (COLACE) 100 MG capsule Take 100 mg by mouth 2 (two) times daily.   Yes [provider]  Ferrous Sulfate Dried 45 MG TBCR Take 1 tablet by mouth 2 (two) times daily.   Yes [provider]  Glucosamine-Chondroit-Vit C-Mn (GLUCOSAMINE CHONDR 1500 COMPLX PO) Take 2 tablets by mouth daily.   Yes [provider]  Insulin Pen Needle (EASY TOUCH PEN NEEDLES) 32G X 6 MM MISC 1 each by Other route daily. E11.9 08/31/19  Yes Romero Belling, MD  levothyroxine (SYNTHROID) 75 MCG tablet TAKE 1 TABLET BY MOUTH  DAILY 08/21/19  Yes Reather Littler, MD  liraglutide (VICTOZA) 18 MG/3ML SOPN Inject 0.3 mLs (1.8 mg total) into the skin every morning. 09/08/19  Yes Romero Belling, MD  metFORMIN (GLUCOPHAGE-XR) 500 MG 24 hr tablet Take 2 tablets (1,000 mg total) by mouth 2 (two) times daily. 09/08/19  Yes Romero Belling, MD  mirabegron ER (MYRBETRIQ) 25 MG TB24 tablet Take 50 mg by mouth daily.   Yes [provider]  Multiple Vitamin (MULTIVITAMIN) tablet Take 1 tablet by mouth daily.   Yes [provider]  omega-3 acid ethyl esters (LOVAZA) 1 g capsule TAKE 2 CAPSULES BY MOUTH  TWICE DAILY 02/17/19  Yes Ardith Dark, MD  omeprazole (PRILOSEC) 40 MG capsule TAKE 1 CAPSULE BY MOUTH  DAILY 09/08/19   Yes Ardith Dark, MD  pioglitazone (ACTOS) 45 MG tablet TAKE 1 TABLET BY MOUTH  DAILY 09/08/19  Yes Romero Belling, MD  PROLENSA 0.07 % SOLN Place 1 drop into both eyes daily. 09/25/19  Yes [provider]  repaglinide (PRANDIN) 2 MG tablet TAKE 1 TABLET(2 MG) BY MOUTH THREE TIMES DAILY BEFORE MEALS 05/12/19  Yes Romero Belling, MD  simvastatin (ZOCOR) 20 MG tablet  TAKE ONE-HALF TABLET BY  MOUTH AT BEDTIME 06/29/19  Yes Ardith Dark, MD  tadalafil (ADCIRCA/CIALIS) 20 MG tablet Take 20 mg by mouth daily as needed for erectile dysfunction.   Yes [provider]  Trospium Chloride 60 MG CP24  12/03/18  Yes [provider]  Turmeric 500 MG CAPS Take 1,500 mg by mouth daily.   Yes [provider]  albuterol (VENTOLIN HFA) 108 (90 Base) MCG/ACT inhaler Inhale 2 puffs into the lungs every 4 (four) hours as needed for wheezing or shortness of breath. 09/27/19   Almon Hercules, MD  ascorbic acid (VITAMIN C) 500 MG tablet Take 1 tablet (500 mg total) by mouth daily. 09/28/19   Almon Hercules, MD  chlorpheniramine-HYDROcodone (TUSSIONEX) 10-8 MG/5ML SUER Take 5 mLs by mouth every 12 (twelve) hours as needed for cough. 09/27/19   Almon Hercules, MD  insulin lispro (HUMALOG) 100 UNIT/ML injection Correction coverage: Moderate Sliding Scale Insulin while on steroid CBG < 70: Implement Hypoglycemia Standing Orders and refer to Hypoglycemia Standing Orders sidebar report CBG 70 - 120: 0 units CBG 121 - 150: 2 units CBG 151 - 200: 3 units CBG 201 - 250: 5 units CBG 251 - 300: 8 units CBG 301 - 350: 11 units CBG 351 - 400: 15 units CBG > 400: call MD and obtain STAT lab verification 09/27/19   Almon Hercules, MD  Insulin Syringe-Needle U-100 (INSULIN SYRINGE .5CC/30GX1/2") 30G X 1/2" 0.5 ML MISC Use one to inject insulin three times a day 09/27/19   Almon Hercules, MD  predniSONE (DELTASONE) 50 MG tablet Take 1 tablet (50 mg total) by mouth daily with breakfast. 09/27/19   Almon Hercules, MD   zinc sulfate 220 (50 Zn) MG capsule Take 1 capsule (220 mg total) by mouth daily. 09/28/19   Almon Hercules, MD    Allergies    Patient has no known allergies.  Review of Systems   Review of Systems  Constitutional: Positive for activity change.  Respiratory: Positive for cough and shortness of breath.   Cardiovascular: Negative for chest pain.  Gastrointestinal: Negative for nausea and vomiting.  Allergic/Immunologic: Negative for immunocompromised state.  Hematological: Does not bruise/bleed easily.  All other systems reviewed and are negative.   Physical Exam Updated Vital Signs BP 122/74   Pulse 84   Temp 98.3 F (36.8 C) (Oral)   Resp (!) 28   Ht 5\' 8"  (1.727 m)   Wt 61.2 kg   SpO2 91%   BMI 20.53 kg/m   Physical Exam Vitals and nursing note reviewed.  Constitutional:      Appearance: He is well-developed.  HENT:     Head: Atraumatic.  Cardiovascular:     Rate and Rhythm: Normal rate.  Pulmonary:     Effort: Pulmonary effort is normal.     Breath sounds: Rales present.     Comments: Mid and lower lung rales bilaterally, tachypnea Musculoskeletal:     Cervical back: Neck supple.  Skin:    General: Skin is warm.  Neurological:     Mental Status: He is alert and oriented to person, place, and time.     ED Results / Procedures / Treatments   Labs (all labs ordered are listed, but only abnormal results are displayed) Labs Reviewed  SARS CORONAVIRUS 2 BY RT PCR (HOSPITAL ORDER, PERFORMED IN [facility]) - Abnormal; Notable for the following components:      Result Value  SARS Coronavirus 2 POSITIVE (*)    All other components within normal limits  COMPREHENSIVE METABOLIC PANEL - Abnormal; Notable for the following components:   Sodium 132 (*)    Calcium 8.5 (*)    Albumin 3.4 (*)    All other components within normal limits  D-DIMER, QUANTITATIVE (NOT AT [facility]) - Abnormal; Notable for the following components:   D-Dimer, Quant 1.35 (*)     All other components within normal limits  LACTATE DEHYDROGENASE - Abnormal; Notable for the following components:   LDH 333 (*)    All other components within normal limits  FERRITIN - Abnormal; Notable for the following components:   Ferritin 406 (*)    All other components within normal limits  FIBRINOGEN - Abnormal; Notable for the following components:   Fibrinogen 784 (*)    All other components within normal limits  C-REACTIVE PROTEIN - Abnormal; Notable for the following components:   CRP 9.2 (*)    All other components within normal limits  HEMOGLOBIN A1C - Abnormal; Notable for the following components:   Hgb A1c MFr Bld 6.1 (*)    All other components within normal limits  CBG MONITORING, ED - Abnormal; Notable for the following components:   Glucose-Capillary 133 (*)    All other components within normal limits  CBG MONITORING, ED - Abnormal; Notable for the following components:   Glucose-Capillary 200 (*)    All other components within normal limits  CBG MONITORING, ED - Abnormal; Notable for the following components:   Glucose-Capillary 306 (*)    All other components within normal limits  CULTURE, BLOOD (ROUTINE X 2)  CULTURE, BLOOD (ROUTINE X 2)  LACTIC ACID, PLASMA  CBC WITH DIFFERENTIAL/PLATELET  PROCALCITONIN  TRIGLYCERIDES  HIV ANTIBODY (ROUTINE TESTING W REFLEX)  LACTIC ACID, PLASMA    EKG EKG Interpretation  Date/Time:  Saturday September 26 2019 22:23:46 EDT Ventricular Rate:  84 PR Interval:    QRS Duration: 149 QT Interval:  397 QTC Calculation: 470 R Axis:   -71 Text Interpretation: Sinus rhythm Atrial premature complex Left atrial enlargement RBBB and LAFB No acute changes No old tracing to compare Confirmed by Derwood Kaplananavati, Zacchaeus Halm [id number]) on 09/26/2019 11:02:21 PM   Radiology CT Angio Chest PE W and/or Wo Contrast  Result Date: 09/27/2019 CLINICAL DATA:  Positive D-dimer. EXAM: CT ANGIOGRAPHY CHEST WITH CONTRAST TECHNIQUE: Multidetector CT  imaging of the chest was performed using the standard protocol during bolus administration of intravenous contrast. Multiplanar CT image reconstructions and MIPs were obtained to evaluate the vascular anatomy. CONTRAST:  100mL OMNIPAQUE IOHEXOL 350 MG/ML SOLN COMPARISON:  None. FINDINGS: Cardiovascular: Heart is normal size. Aorta is normal caliber. No filling defects in the pulmonary arteries to suggest pulmonary emboli. Mediastinum/Nodes: No mediastinal, hilar, or axillary adenopathy. Trachea and esophagus are unremarkable. Thyroid unremarkable. Lungs/Pleura: Extensive bilateral ground-glass airspace opacities compatible with COVID pneumonia. No effusions or pneumothorax. Upper Abdomen: Imaging into the upper abdomen demonstrates no acute findings. Musculoskeletal: Chest wall soft tissues are unremarkable. No acute bony abnormality. Review of the MIP images confirms the above findings. IMPRESSION: No evidence of pulmonary embolus. Extensive bilateral ground-glass airspace opacities compatible with COVID pneumonia. Electronically Signed   By: Charlett NoseKevin  Dover M.D.   On: 09/27/2019 02:06   DG Chest Port 1 View  Result Date: 09/26/2019 CLINICAL DATA:  COVID EXAM: PORTABLE CHEST 1 VIEW COMPARISON:  None. FINDINGS: Patchy bilateral airspace opacities. Heart is normal size. No effusions. No acute bony abnormality. IMPRESSION: Patchy bilateral  airspace disease compatible with COVID pneumonia. Electronically Signed   By: Charlett Nose M.D.   On: 09/26/2019 22:44    Procedures .Critical Care Performed by: Derwood Kaplan, MD Authorized by: Derwood Kaplan, MD   Critical care provider statement:    Critical care time (minutes):  40   Critical care was necessary to treat or prevent imminent or life-threatening deterioration of the following conditions:  Respiratory failure   Critical care was time spent personally by me on the following activities:  Discussions with consultants, evaluation of patient's response to  treatment, examination of patient, ordering and performing treatments and interventions, ordering and review of laboratory studies, ordering and review of radiographic studies, pulse oximetry, re-evaluation of patient's condition, obtaining history from patient or surrogate and review of old charts   (including critical care time)  Medications Ordered in ED Medications  albuterol (VENTOLIN HFA) 108 (90 Base) MCG/ACT inhaler 4 puff (4 puffs Inhalation Not Given 09/27/19 0730)  remdesivir 200 mg in sodium chloride 0.9% 250 mL IVPB (0 mg Intravenous Stopped 09/27/19 0200)    Followed by  remdesivir 100 mg in sodium chloride 0.9 % 100 mL IVPB (has no administration in time range)  colesevelam [facility]) tablet 2,500 mg (2,500 mg Oral Given 09/27/19 1002)  omega-3 acid ethyl esters (LOVAZA) capsule 2 g (2 g Oral Given 09/27/19 1003)  simvastatin (ZOCOR) tablet 10 mg (has no administration in time range)  levothyroxine (SYNTHROID) tablet 75 mcg (75 mcg Oral Given 09/27/19 0759)  mirabegron ER (MYRBETRIQ) tablet 50 mg (50 mg Oral Given 09/27/19 1001)  bromocriptine (PARLODEL) tablet 2.5 mg (2.5 mg Oral Given 09/27/19 1002)  clomiPHENE (CLOMID) tablet 25 mg (25 mg Oral Not Given 09/27/19 1005)  insulin aspart (novoLOG) injection 0-9 Units (7 Units Subcutaneous Given 09/27/19 1211)  insulin aspart (novoLOG) injection 0-5 Units (has no administration in time range)  enoxaparin (LOVENOX) injection 40 mg (40 mg Subcutaneous Given 09/27/19 1004)  albuterol (VENTOLIN HFA) 108 (90 Base) MCG/ACT inhaler 2 puff (has no administration in time range)  methylPREDNISolone sodium succinate (SOLU-MEDROL) 40 mg/mL injection 30.8 mg (30.8 mg Intravenous Given 09/27/19 1004)    Followed by  predniSONE (DELTASONE) tablet 50 mg (has no administration in time range)  guaiFENesin-dextromethorphan (ROBITUSSIN DM) 100-10 MG/5ML syrup 10 mL (has no administration in time range)  chlorpheniramine-HYDROcodone (TUSSIONEX) 10-8 MG/5ML suspension 5 mL  (has no administration in time range)  ascorbic acid (VITAMIN C) tablet 500 mg (500 mg Oral Given 09/27/19 1001)  zinc sulfate capsule 220 mg (220 mg Oral Given 09/27/19 1003)  cholecalciferol (VITAMIN D3) tablet 1,000 Units (1,000 Units Oral Given 09/27/19 1001)  acetaminophen (TYLENOL) tablet 650 mg (has no administration in time range)  ferrous sulfate tablet 325 mg (325 mg Oral Given 09/27/19 0800)  aspirin EC tablet 325 mg (325 mg Oral Given 09/27/19 0016)  dexamethasone (DECADRON) injection 10 mg (10 mg Intravenous Given 09/26/19 2356)  iohexol (OMNIPAQUE) 350 MG/ML injection 100 mL (100 mLs Intravenous Contrast Given 09/27/19 0142)    ED Course  I have reviewed the triage vital signs and the nursing notes.  Pertinent labs & imaging results that were available during my care of the patient were reviewed by me and considered in my medical decision making (see chart for details).    MDM Rules/Calculators/A&P                          Patient comes in a chief complaint of low pulse  ox. He was diagnosed with COVID-19 last week, symptomatic for now almost 10 days and had monoclonal antibodies 2 days ago. Marland Kitchen He is tachypneic, not hypoxic during my eval but his O2 sats were low at some point.  Currently stable. Suspicion is that he is likely need admission because of tachypnea and hypoxia. Decadron given. Albuterol inhaler given.  Family has been updated.  RANBIR CHEW was evaluated in Emergency Department on 09/27/2019 for the symptoms described in the history of present illness. He was evaluated in the context of the global COVID-19 pandemic, which necessitated consideration that the patient might be at risk for infection with the SARS-CoV-2 virus that causes COVID-19. Institutional protocols and algorithms that pertain to the evaluation of patients at risk for COVID-19 are in a state of rapid change based on information released by regulatory bodies including the CDC and federal and state  organizations. These policies and algorithms were followed during the patient's care in the ED.   Final Clinical Impression(s) / ED Diagnoses Final diagnoses:  COVID-19  Acute respiratory failure with hypoxia (HCC)  Tachypnea    Rx / DC Orders ED Discharge Orders         Ordered    zinc sulfate 220 (50 Zn) MG capsule  Daily        09/27/19 1142    ascorbic acid (VITAMIN C) 500 MG tablet  Daily        09/27/19 1142    predniSONE (DELTASONE) 50 MG tablet  Daily with breakfast        09/27/19 1142    chlorpheniramine-HYDROcodone (TUSSIONEX) 10-8 MG/5ML SUER  Every 12 hours PRN        09/27/19 1142    albuterol (VENTOLIN HFA) 108 (90 Base) MCG/ACT inhaler  Every 4 hours PRN        09/27/19 1142    insulin lispro (HUMALOG) 100 UNIT/ML injection        09/27/19 1142    Insulin Syringe-Needle U-100 (INSULIN SYRINGE .5CC/30GX1/2") 30G X 1/2" 0.5 ML MISC        09/27/19 1142    Increase activity slowly        09/27/19 1142    Diet - low sodium heart healthy        09/27/19 1142    Discharge instructions       Comments: It has been a pleasure taking care of you!  You were hospitalized due to COVID-19 infection.  You have been started on steroid and remdesivir.  Since you are eager to go home, we are discharging you on prednisone. Remdesivir infusion has been scheduled for you at 1pm on Monday 9/6, Tuesday 9/7, Wednesday 9/8, and Thursday 9/9 at [facility]. Please arrive 15-30 minutes early.   Prednisone could raise your blood glucose.  Would recommend checking the blood glucose about 15 minutes before each main meals, and injecting insulin on a sliding scale basis.   In regards to Covid infection, you are still potentially infectious for the next 15-21 days. We recommend you isolate yourself and take the necessary precautions to prevent the virus from spreading.  Some of the steps to prevent the virus from spreading to others: Stay away from other and members of your  household at least for 15 days. Let healthy household members care for children and pets, if possible. If you have to care for children or pets, wash your hands often and wear a mask. If possible, stay in your own room, separate from  others. Use a different bathroom.Make sure that all people in your household wash their hands well and often. Leave your house only to seek medical care. Do not use public transport. Do not travel while you are sick. Wash your hands often with soap and water for 20 seconds. If soap and water are not available, use alcohol-based hand sanitizer. Cough or sneeze into a tissue or your sleeve or elbow. Do not cough or sneeze into your hand or into the air. Wear a cloth face covering or face mask.  Return precautions: Get help or return to the hospital right away if: You have trouble breathing. You have pain or pressure in your chest. You have confusion. You have bluish lips and fingernails. You have difficulty waking from sleep. You have symptoms that get worse. These symptoms may represent a serious problem that is an emergency. Do not wait to see if the symptoms will go away. Get medical help right away. Call your local emergency services (911 in the [place] Do not drive yourself to the hospital. Let the emergency medical personnel know if you think you have COVID-19.  To protect yourself in the future:  Do not travel to areas where COVID-19 is a risk. The areas where COVID-19 is reported change often. To identify high-risk areas and travel restrictions, check the CDC travel website: StageSync.si If you live in, or must travel to, an area where COVID-19 is a risk, take precautions to avoid infection. Stay away from people who are sick. Wash your hands often with soap and water for 20 seconds. If soap and water are not available, use an alcohol-based hand sanitizer. Avoid touching your mouth, face, eyes, or nose. Avoid going out in public, follow  guidance from your state and local health authorities. If you must go out in public, wear a cloth face covering or face mask. Disinfect objects and surfaces that are frequently touched every day. This may include: Counters and tables. Doorknobs and light switches. Sinks and faucets. Electronics, such as phones, remote controls, keyboards, computers, and tablets.    Where to find more information Centers for Disease Control and Prevention: StickerEmporium.tn World Health Organization: https://thompson-craig.com/   09/27/19 1142    Diet Carb Modified        09/27/19 1142    Call MD for:  difficulty breathing, headache or visual disturbances        09/27/19 1142    Call MD for:  extreme fatigue        09/27/19 1142    Call MD for:  persistant dizziness or light-headedness        09/27/19 1142           Derwood Kaplan, MD 09/26/19 2321    Derwood Kaplan, MD 09/27/19 1414

## 2019-09-26 NOTE — ED Triage Notes (Signed)
Pt reports that his O2 reading was 87% at home. He received the monoclonal antibody infusion on Thursday and his pulse ox was 98%. States that he feels alright. No obvious respiratory distress. A&Ox4. He does not use oxygen.

## 2019-09-27 ENCOUNTER — Emergency Department (HOSPITAL_COMMUNITY): Payer: Medicare Other

## 2019-09-27 ENCOUNTER — Encounter (HOSPITAL_COMMUNITY): Payer: Self-pay

## 2019-09-27 DIAGNOSIS — E1169 Type 2 diabetes mellitus with other specified complication: Secondary | ICD-10-CM | POA: Diagnosis present

## 2019-09-27 DIAGNOSIS — J9601 Acute respiratory failure with hypoxia: Secondary | ICD-10-CM

## 2019-09-27 DIAGNOSIS — U071 COVID-19: Secondary | ICD-10-CM | POA: Diagnosis present

## 2019-09-27 DIAGNOSIS — E119 Type 2 diabetes mellitus without complications: Secondary | ICD-10-CM

## 2019-09-27 DIAGNOSIS — Z79890 Hormone replacement therapy (postmenopausal): Secondary | ICD-10-CM | POA: Diagnosis not present

## 2019-09-27 DIAGNOSIS — J189 Pneumonia, unspecified organism: Secondary | ICD-10-CM | POA: Diagnosis not present

## 2019-09-27 DIAGNOSIS — R0602 Shortness of breath: Secondary | ICD-10-CM | POA: Diagnosis present

## 2019-09-27 DIAGNOSIS — F329 Major depressive disorder, single episode, unspecified: Secondary | ICD-10-CM | POA: Diagnosis present

## 2019-09-27 DIAGNOSIS — E039 Hypothyroidism, unspecified: Secondary | ICD-10-CM | POA: Diagnosis present

## 2019-09-27 DIAGNOSIS — E785 Hyperlipidemia, unspecified: Secondary | ICD-10-CM | POA: Diagnosis present

## 2019-09-27 DIAGNOSIS — N400 Enlarged prostate without lower urinary tract symptoms: Secondary | ICD-10-CM | POA: Diagnosis present

## 2019-09-27 DIAGNOSIS — Z79899 Other long term (current) drug therapy: Secondary | ICD-10-CM | POA: Diagnosis not present

## 2019-09-27 DIAGNOSIS — E1165 Type 2 diabetes mellitus with hyperglycemia: Secondary | ICD-10-CM | POA: Diagnosis not present

## 2019-09-27 DIAGNOSIS — E291 Testicular hypofunction: Secondary | ICD-10-CM | POA: Diagnosis present

## 2019-09-27 DIAGNOSIS — T380X5A Adverse effect of glucocorticoids and synthetic analogues, initial encounter: Secondary | ICD-10-CM | POA: Diagnosis not present

## 2019-09-27 DIAGNOSIS — Z96653 Presence of artificial knee joint, bilateral: Secondary | ICD-10-CM | POA: Diagnosis present

## 2019-09-27 DIAGNOSIS — M170 Bilateral primary osteoarthritis of knee: Secondary | ICD-10-CM | POA: Diagnosis present

## 2019-09-27 DIAGNOSIS — J1282 Pneumonia due to coronavirus disease 2019: Secondary | ICD-10-CM | POA: Diagnosis present

## 2019-09-27 DIAGNOSIS — Z7984 Long term (current) use of oral hypoglycemic drugs: Secondary | ICD-10-CM | POA: Diagnosis not present

## 2019-09-27 LAB — COMPREHENSIVE METABOLIC PANEL
ALT: 21 U/L (ref 0–44)
AST: 37 U/L (ref 15–41)
Albumin: 3.4 g/dL — ABNORMAL LOW (ref 3.5–5.0)
Alkaline Phosphatase: 44 U/L (ref 38–126)
Anion gap: 10 (ref 5–15)
BUN: 17 mg/dL (ref 8–23)
CO2: 23 mmol/L (ref 22–32)
Calcium: 8.5 mg/dL — ABNORMAL LOW (ref 8.9–10.3)
Chloride: 99 mmol/L (ref 98–111)
Creatinine, Ser: 1.03 mg/dL (ref 0.61–1.24)
GFR calc Af Amer: >60 mL/min (ref >60)
GFR calc non Af Amer: >60 mL/min (ref >60)
Glucose, Bld: 84 mg/dL (ref 70–99)
Potassium: 3.9 mmol/L (ref 3.5–5.1)
Sodium: 132 mmol/L — ABNORMAL LOW (ref 135–145)
Total Bilirubin: 0.6 mg/dL (ref 0.3–1.2)
Total Protein: 6.5 g/dL (ref 6.5–8.1)

## 2019-09-27 LAB — LACTATE DEHYDROGENASE: LDH: 333 U/L — ABNORMAL HIGH (ref 98–192)

## 2019-09-27 LAB — PROCALCITONIN: Procalcitonin: <0.1 ng/mL

## 2019-09-27 LAB — FERRITIN: Ferritin: 406 ng/mL — ABNORMAL HIGH (ref 24–336)

## 2019-09-27 LAB — D-DIMER, QUANTITATIVE: D-Dimer, Quant: 1.35 ug/mL-FEU — ABNORMAL HIGH (ref 0.00–0.50)

## 2019-09-27 LAB — C-REACTIVE PROTEIN: CRP: 9.2 mg/dL — ABNORMAL HIGH (ref <1.0)

## 2019-09-27 LAB — CBG MONITORING, ED
Glucose-Capillary: 133 mg/dL — ABNORMAL HIGH (ref 70–99)
Glucose-Capillary: 200 mg/dL — ABNORMAL HIGH (ref 70–99)
Glucose-Capillary: 306 mg/dL — ABNORMAL HIGH (ref 70–99)
Glucose-Capillary: 214 mg/dL — ABNORMAL HIGH (ref 70–99)
Glucose-Capillary: 153 mg/dL — ABNORMAL HIGH (ref 70–99)

## 2019-09-27 LAB — HEMOGLOBIN A1C
Hgb A1c MFr Bld: 6.1 % — ABNORMAL HIGH (ref 4.8–5.6)
Mean Plasma Glucose: 128.37 mg/dL

## 2019-09-27 LAB — SARS CORONAVIRUS 2 BY RT PCR (HOSPITAL ORDER, PERFORMED IN ~~LOC~~ HOSPITAL LAB): SARS Coronavirus 2: POSITIVE — AB

## 2019-09-27 LAB — TRIGLYCERIDES: Triglycerides: 136 mg/dL (ref <150)

## 2019-09-27 LAB — LACTIC ACID, PLASMA: Lactic Acid, Venous: 1.1 mmol/L (ref 0.5–1.9)

## 2019-09-27 LAB — FIBRINOGEN: Fibrinogen: 784 mg/dL — ABNORMAL HIGH (ref 210–475)

## 2019-09-27 LAB — HIV ANTIBODY (ROUTINE TESTING W REFLEX): HIV Screen 4th Generation wRfx: NONREACTIVE

## 2019-09-27 MED ORDER — ALBUTEROL SULFATE HFA 108 (90 BASE) MCG/ACT IN AERS: 2.0000 | INHALATION_SPRAY | RESPIRATORY_TRACT | Status: DC | PRN

## 2019-09-27 MED ORDER — IOHEXOL 350 MG/ML SOLN
100.0000 mL | Freq: Once | INTRAVENOUS | Status: AC | PRN
  Administered 2019-09-27: 100 mL via INTRAVENOUS

## 2019-09-27 MED ORDER — HYDROCOD POLST-CPM POLST ER 10-8 MG/5ML PO SUER
5.0000 mL | Freq: Two times a day (BID) | ORAL | Status: DC | PRN
  Administered 2019-09-30: 5 mL via ORAL
  Filled 2019-09-27: qty 5

## 2019-09-27 MED ORDER — INSULIN ASPART 100 UNIT/ML ~~LOC~~ SOLN
0.0000 [IU] | Freq: Three times a day (TID) | SUBCUTANEOUS | Status: DC
  Administered 2019-09-27: 2 [IU] via SUBCUTANEOUS
  Administered 2019-09-27: 7 [IU] via SUBCUTANEOUS
  Filled 2019-09-27: qty 0.09

## 2019-09-27 MED ORDER — INSULIN ASPART 100 UNIT/ML ~~LOC~~ SOLN
0.0000 [IU] | Freq: Every day | SUBCUTANEOUS | Status: DC
  Administered 2019-09-30: 2 [IU] via SUBCUTANEOUS
  Filled 2019-09-27: qty 0.05

## 2019-09-27 MED ORDER — MIRABEGRON ER 25 MG PO TB24
50.0000 mg | ORAL_TABLET | Freq: Every day | ORAL | Status: DC
  Administered 2019-09-27 – 2019-10-01 (×5): 50 mg via ORAL
  Filled 2019-09-27 (×5): qty 2

## 2019-09-27 MED ORDER — ASCORBIC ACID 500 MG PO TABS
500.0000 mg | ORAL_TABLET | Freq: Every day | ORAL | Status: DC
  Administered 2019-09-27 – 2019-10-01 (×5): 500 mg via ORAL
  Filled 2019-09-27 (×5): qty 1

## 2019-09-27 MED ORDER — LINAGLIPTIN 5 MG PO TABS
5.0000 mg | ORAL_TABLET | Freq: Every day | ORAL | Status: DC
  Administered 2019-09-27 – 2019-10-01 (×5): 5 mg via ORAL
  Filled 2019-09-27 (×5): qty 1

## 2019-09-27 MED ORDER — GUAIFENESIN-DM 100-10 MG/5ML PO SYRP
10.0000 mL | ORAL_SOLUTION | ORAL | Status: DC | PRN
  Administered 2019-10-01: 10 mL via ORAL

## 2019-09-27 MED ORDER — VITAMIN D 25 MCG (1000 UNIT) PO TABS
1000.0000 [IU] | ORAL_TABLET | Freq: Every day | ORAL | Status: DC
  Administered 2019-09-27 – 2019-10-01 (×5): 1000 [IU] via ORAL
  Filled 2019-09-27 (×4): qty 1

## 2019-09-27 MED ORDER — ACETAMINOPHEN 325 MG PO TABS: 650.0000 mg | ORAL_TABLET | Freq: Four times a day (QID) | ORAL | Status: DC | PRN

## 2019-09-27 MED ORDER — PREDNISONE 50 MG PO TABS
50.0000 mg | ORAL_TABLET | Freq: Every day | ORAL | 0 refills | Status: DC
Start: 2019-09-27 — End: 2019-10-01

## 2019-09-27 MED ORDER — ENOXAPARIN SODIUM 40 MG/0.4ML ~~LOC~~ SOLN
40.0000 mg | SUBCUTANEOUS | Status: DC
  Administered 2019-09-27 – 2019-10-01 (×5): 40 mg via SUBCUTANEOUS
  Filled 2019-09-27 (×5): qty 0.4

## 2019-09-27 MED ORDER — INSULIN ASPART 100 UNIT/ML ~~LOC~~ SOLN
0.0000 [IU] | Freq: Every day | SUBCUTANEOUS | Status: DC
  Filled 2019-09-27: qty 0.05

## 2019-09-27 MED ORDER — INSULIN ASPART 100 UNIT/ML ~~LOC~~ SOLN
0.0000 [IU] | Freq: Three times a day (TID) | SUBCUTANEOUS | Status: DC
  Administered 2019-09-27: 5 [IU] via SUBCUTANEOUS
  Administered 2019-09-28: 3 [IU] via SUBCUTANEOUS
  Administered 2019-09-28: 2 [IU] via SUBCUTANEOUS
  Administered 2019-09-29 – 2019-09-30 (×3): 5 [IU] via SUBCUTANEOUS
  Administered 2019-09-30: 3 [IU] via SUBCUTANEOUS
  Administered 2019-09-30: 5 [IU] via SUBCUTANEOUS
  Administered 2019-10-01: 3 [IU] via SUBCUTANEOUS
  Administered 2019-10-01: 5 [IU] via SUBCUTANEOUS
  Filled 2019-09-27: qty 0.15

## 2019-09-27 MED ORDER — ALBUTEROL SULFATE HFA 108 (90 BASE) MCG/ACT IN AERS: 2.0000 | INHALATION_SPRAY | RESPIRATORY_TRACT | 1 refills | Status: DC | PRN

## 2019-09-27 MED ORDER — SIMVASTATIN 10 MG PO TABS
10.0000 mg | ORAL_TABLET | Freq: Every day | ORAL | Status: DC
  Administered 2019-09-27 – 2019-09-30 (×4): 10 mg via ORAL
  Filled 2019-09-27 (×4): qty 1

## 2019-09-27 MED ORDER — ZINC SULFATE 220 (50 ZN) MG PO CAPS
220.0000 mg | ORAL_CAPSULE | Freq: Every day | ORAL | Status: DC
  Administered 2019-09-27 – 2019-10-01 (×5): 220 mg via ORAL
  Filled 2019-09-27 (×5): qty 1

## 2019-09-27 MED ORDER — SODIUM CHLORIDE 0.9 % IV SOLN
100.0000 mg | Freq: Every day | INTRAVENOUS | Status: AC
  Administered 2019-09-28 – 2019-10-01 (×4): 100 mg via INTRAVENOUS
  Filled 2019-09-27 (×4): qty 20

## 2019-09-27 MED ORDER — FERROUS SULFATE 325 (65 FE) MG PO TABS
325.0000 mg | ORAL_TABLET | Freq: Two times a day (BID) | ORAL | Status: DC
  Administered 2019-09-27 – 2019-10-01 (×9): 325 mg via ORAL
  Filled 2019-09-27 (×9): qty 1

## 2019-09-27 MED ORDER — FERROUS SULFATE DRIED ER 45 MG PO TBCR: 1.0000 | EXTENDED_RELEASE_TABLET | Freq: Two times a day (BID) | ORAL | Status: DC

## 2019-09-27 MED ORDER — SODIUM CHLORIDE 0.9 % IV SOLN
200.0000 mg | Freq: Once | INTRAVENOUS | Status: AC
  Administered 2019-09-27: 200 mg via INTRAVENOUS
  Filled 2019-09-27: qty 200

## 2019-09-27 MED ORDER — PREDNISONE 20 MG PO TABS
50.0000 mg | ORAL_TABLET | Freq: Every day | ORAL | Status: DC
  Administered 2019-09-30 – 2019-10-01 (×2): 50 mg via ORAL
  Filled 2019-09-27 (×2): qty 2

## 2019-09-27 MED ORDER — HYDROCOD POLST-CPM POLST ER 10-8 MG/5ML PO SUER
5.0000 mL | Freq: Two times a day (BID) | ORAL | 0 refills | Status: DC | PRN
Start: 2019-09-27 — End: 2020-02-23

## 2019-09-27 MED ORDER — INSULIN LISPRO 100 UNIT/ML ~~LOC~~ SOLN: SUBCUTANEOUS | 0 refills | Status: DC

## 2019-09-27 MED ORDER — CLOMIPHENE CITRATE 50 MG PO TABS
25.0000 mg | ORAL_TABLET | Freq: Every day | ORAL | Status: DC
  Administered 2019-10-01: 25 mg via ORAL

## 2019-09-27 MED ORDER — ASCORBIC ACID 500 MG PO TABS: 500.0000 mg | ORAL_TABLET | Freq: Every day | ORAL | 0 refills | Status: AC

## 2019-09-27 MED ORDER — "INSULIN SYRINGE 30G X 1/2"" 0.5 ML MISC": 0 refills | Status: DC

## 2019-09-27 MED ORDER — METHYLPREDNISOLONE SODIUM SUCC 40 MG IJ SOLR
0.5000 mg/kg | Freq: Two times a day (BID) | INTRAMUSCULAR | Status: AC
  Administered 2019-09-27 – 2019-09-29 (×6): 30.8 mg via INTRAVENOUS
  Filled 2019-09-27 (×6): qty 1

## 2019-09-27 MED ORDER — COLESEVELAM HCL 625 MG PO TABS
2500.0000 mg | ORAL_TABLET | Freq: Every day | ORAL | Status: DC
  Administered 2019-09-27 – 2019-10-01 (×5): 2500 mg via ORAL
  Filled 2019-09-27 (×5): qty 4

## 2019-09-27 MED ORDER — OMEGA-3-ACID ETHYL ESTERS 1 G PO CAPS
2.0000 | ORAL_CAPSULE | Freq: Two times a day (BID) | ORAL | Status: DC
  Administered 2019-09-27 – 2019-10-01 (×9): 2 g via ORAL
  Filled 2019-09-27 (×9): qty 2

## 2019-09-27 MED ORDER — ZINC SULFATE 220 (50 ZN) MG PO CAPS: 220.0000 mg | ORAL_CAPSULE | Freq: Every day | ORAL | 0 refills | Status: DC

## 2019-09-27 MED ORDER — BROMOCRIPTINE MESYLATE 2.5 MG PO TABS
2.5000 mg | ORAL_TABLET | Freq: Every day | ORAL | Status: DC
  Administered 2019-09-27 – 2019-10-01 (×5): 2.5 mg via ORAL
  Filled 2019-09-27 (×5): qty 1

## 2019-09-27 MED ORDER — LEVOTHYROXINE SODIUM 75 MCG PO TABS
75.0000 ug | ORAL_TABLET | Freq: Every day | ORAL | Status: DC
  Administered 2019-09-27 – 2019-10-01 (×5): 75 ug via ORAL
  Filled 2019-09-27 (×5): qty 1

## 2019-09-27 MED ORDER — INSULIN ASPART 100 UNIT/ML ~~LOC~~ SOLN
4.0000 [IU] | Freq: Three times a day (TID) | SUBCUTANEOUS | Status: DC
  Administered 2019-09-27 – 2019-09-29 (×5): 4 [IU] via SUBCUTANEOUS
  Filled 2019-09-27: qty 0.04

## 2019-09-27 NOTE — ED Notes (Addendum)
SATURATION QUALIFICATIONS: (This note is used to comply with regulatory documentation for home oxygen)  Patient Saturations on Room Air at Rest = 89%  Patient Saturations on Room Air while Ambulating = 88%  Patient Saturations on 2 Liters of oxygen while Ambulating = 92%  Please briefly explain why patient needs home oxygen:

## 2019-09-27 NOTE — Care Management Obs Status (Signed)
MEDICARE OBSERVATION STATUS NOTIFICATION   Patient Details  Name: David Maynard MRN: [id number] Date of Birth: 02-Aug-1950   Medicare Observation Status Notification Given:  Yes    Elliot Cousin, RN 09/27/2019, 1:23 PM

## 2019-09-27 NOTE — H&P (Signed)
History and Physical    David Maynard:[id number] DOB: 1950-04-23 DOA: 09/26/2019  PCP: Ardith Dark, MD Patient coming from: Home  Chief Complaint: Low pulse ox, Covid positive  HPI: David Maynard is a 69 y.o. male with medical history significant of non-insulin-dependent type 2 diabetes, hyperlipidemia, BPH, depression, hypothyroidism, recently tested positive for COVID-19 on 8/31 and received monoclonal antibody infusion on 9/2.  Today his pulse ox readings were in the 80s and he came to the ED to be evaluated.  Patient states his symptoms started about 10 days ago.  He is having cough, shortness of breath, and fatigue.  He was previously having fevers which have now resolved.  Denies chest pain, nausea, vomiting, abdominal pain, or diarrhea.  States he received his first dose of Pfizer vaccine in December 2020 and the second dose in January 2021.  ED Course: Afebrile.  Not tachycardic.  Tachypneic with respiratory rate in the 30s and oxygen saturation as low as 88% on room air.  Labs showing no leukocytosis.  Lactic acid normal.  Repeat SARS-CoV-2 PCR test pending.  LFTs normal.  Procalcitonin <0.10.  Inflammatory markers elevated: D-dimer 1.35, LDH 333, ferritin 406, fibrinogen 784, and CRP 9.2.  Chest x-ray showing patchy bilateral airspace disease compatible with Covid pneumonia.  CT angiogram chest negative for PE.  Showing extensive bilateral groundglass airspace opacities compatible with Covid pneumonia.  Patient was given aspirin, Decadron, and remdesivir.  Review of Systems:  All systems reviewed and apart from history of presenting illness, are negative.  Past Medical History:  Diagnosis Date  . Anemia    history of anemia  . 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 Depressive disorder, not elsewhere classified    none  . Diabetes mellitus type II   . Hypercholesterolemia   . Hypogonadism male   . Primary localized osteoarthritis of left knee 12/11/2017  . Primary  localized osteoarthritis of right knee 10/23/2017  . 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 Thyroid disease   . Unspecified hypothyroidism     Past Surgical History:  Procedure Laterality Date  . ESOPHAGOGASTRODUODENOSCOPY  06/16/2003  . INGUINAL HERNIA REPAIR     left  . JOINT REPLACEMENT    . KNEE ARTHROSCOPY     Left  . KNEE ARTHROSCOPY W/ ACL RECONSTRUCTION     left  . ROTATOR CUFF REPAIR     Right x2  . Stress Cardiolite  07/15/2000  . TOTAL KNEE ARTHROPLASTY Right 11/04/2017  . TOTAL KNEE ARTHROPLASTY Right 11/04/2017   Procedure: TOTAL KNEE ARTHROPLASTY;  Surgeon: Salvatore Marvel, MD;  Location: [facility] OR;  Service: Orthopedics;  Laterality: Right;  . TOTAL KNEE ARTHROPLASTY Left 12/23/2017  . TOTAL KNEE ARTHROPLASTY Left 12/23/2017   Procedure: TOTAL KNEE ARTHROPLASTY;  Surgeon: Salvatore Marvel, MD;  Location: [facility] OR;  Service: Orthopedics;  Laterality: Left;  Marland Kitchen VASECTOMY  05/2002     reports that he has never smoked. He has never used smokeless tobacco. He reports current alcohol use of about 1.0 standard drink of alcohol per week. He reports that he does not use drugs.  No Known Allergies  Family History  Problem Relation Age of Onset  . Breast cancer Neg Hx   . Celiac disease Neg Hx   . Cirrhosis Neg Hx   . Clotting disorder Neg Hx   . Colitis Neg Hx   . Colon cancer Neg Hx   . Colon polyps Neg Hx   . Crohn's disease Neg Hx   .  Cystic fibrosis Neg Hx   . Diabetes Neg Hx   . Esophageal cancer Neg Hx   . Heart disease Neg Hx   . Hemochromatosis Neg Hx   . Inflammatory bowel disease Neg Hx   . Irritable bowel syndrome Neg Hx   . Kidney disease Neg Hx   . Liver cancer Neg Hx   . Liver disease Neg Hx   . Ovarian cancer Neg Hx   . Pancreatic cancer Neg Hx   . Prostate cancer Neg Hx   . Rectal cancer Neg Hx   . Stomach cancer Neg Hx   . Ulcerative colitis Neg Hx   . Uterine cancer Neg Hx   . Wilson's disease Neg Hx     Prior to Admission medications     Medication Sig Start Date End Date Taking? Authorizing Provider  ACCU-CHEK GUIDE test strip USE TO CHECK BLOOD SUGAR 6  TIMES DAILY 06/30/19  Yes Romero Belling, MD  bromocriptine (PARLODEL) 2.5 MG tablet TAKE 1 TABLET BY MOUTH  DAILY 11/25/18  Yes Romero Belling, MD  Calcium Carbonate (CALCIUM 600 PO) Take 1 tablet by mouth daily.   Yes [provider]  Cinnamon (HM CINNAMON) 500 MG capsule Take 3,000 mg by mouth 2 (two) times daily.   Yes [provider]  clomiPHENE (CLOMID) 50 MG tablet Take 0.5 tablets (25 mg total) by mouth daily. 09/08/19  Yes Romero Belling, MD  colesevelam [facility]) 625 MG tablet TAKE 4 TABLETS BY MOUTH  DAILY 02/17/19  Yes Romero Belling, MD  docusate sodium (COLACE) 100 MG capsule Take 100 mg by mouth 2 (two) times daily.   Yes [provider]  Ferrous Sulfate Dried 45 MG TBCR Take 1 tablet by mouth 2 (two) times daily.   Yes [provider]  Glucosamine-Chondroit-Vit C-Mn (GLUCOSAMINE CHONDR 1500 COMPLX PO) Take 2 tablets by mouth daily.   Yes [provider]  Insulin Pen Needle (EASY TOUCH PEN NEEDLES) 32G X 6 MM MISC 1 each by Other route daily. E11.9 08/31/19  Yes Romero Belling, MD  levothyroxine (SYNTHROID) 75 MCG tablet TAKE 1 TABLET BY MOUTH  DAILY 08/21/19  Yes Reather Littler, MD  liraglutide (VICTOZA) 18 MG/3ML SOPN Inject 0.3 mLs (1.8 mg total) into the skin every morning. 09/08/19  Yes Romero Belling, MD  metFORMIN (GLUCOPHAGE-XR) 500 MG 24 hr tablet Take 2 tablets (1,000 mg total) by mouth 2 (two) times daily. 09/08/19  Yes Romero Belling, MD  mirabegron ER (MYRBETRIQ) 25 MG TB24 tablet Take 50 mg by mouth daily.   Yes [provider]  Multiple Vitamin (MULTIVITAMIN) tablet Take 1 tablet by mouth daily.   Yes [provider]  omega-3 acid ethyl esters (LOVAZA) 1 g capsule TAKE 2 CAPSULES BY MOUTH  TWICE DAILY 02/17/19  Yes Ardith Dark, MD  omeprazole (PRILOSEC) 40 MG capsule TAKE 1 CAPSULE BY MOUTH  DAILY 09/08/19   Yes Ardith Dark, MD  pioglitazone (ACTOS) 45 MG tablet TAKE 1 TABLET BY MOUTH  DAILY 09/08/19  Yes Romero Belling, MD  PROLENSA 0.07 % SOLN Place 1 drop into both eyes daily. 09/25/19  Yes [provider]  repaglinide (PRANDIN) 2 MG tablet TAKE 1 TABLET(2 MG) BY MOUTH THREE TIMES DAILY BEFORE MEALS 05/12/19  Yes Romero Belling, MD  simvastatin (ZOCOR) 20 MG tablet TAKE ONE-HALF TABLET BY  MOUTH AT BEDTIME 06/29/19  Yes Ardith Dark, MD  tadalafil (ADCIRCA/CIALIS) 20 MG tablet Take 20 mg by mouth daily as needed for erectile  dysfunction.   Yes [provider]  Trospium Chloride 60 MG CP24  12/03/18  Yes [provider]  Turmeric 500 MG CAPS Take 1,500 mg by mouth daily.   Yes [provider]  amoxicillin (AMOXIL) 500 MG tablet Take 4 tablets by mouth as a single dose 1 hour prior to having dental work. 02/06/19   Ardith Dark, MD    Physical Exam: Vitals:   09/27/19 0000 09/27/19 0100 09/27/19 0111 09/27/19 0309  BP: (!) 144/76 130/63    Pulse: 84 87 95 80  Resp: (!) 29 (!) 30 (!) 38 (!) 28  Temp: 99.1 F (37.3 C)     TempSrc: Oral     SpO2: 92% 95% 95% 95%  Weight:      Height:        Physical Exam Constitutional:      General: He is not in acute distress. HENT:     Head: Normocephalic and atraumatic.  Eyes:     Extraocular Movements: Extraocular movements intact.     Conjunctiva/sclera: Conjunctivae normal.  Cardiovascular:     Rate and Rhythm: Normal rate and regular rhythm.     Pulses: Normal pulses.  Pulmonary:     Breath sounds: Rales present. No wheezing.     Comments: Satting 96-97% on room air Respiratory rate mid 20s to 30s Abdominal:     General: Bowel sounds are normal. There is no distension.     Palpations: Abdomen is soft.     Tenderness: There is no abdominal tenderness.  Musculoskeletal:        General: No swelling or tenderness.     Cervical back: Normal range of motion and neck supple.  Skin:    General: Skin is  warm and dry.  Neurological:     Mental Status: He is alert and oriented to person, place, and time.     Labs on Admission: I have personally reviewed following labs and imaging studies  CBC: Recent Labs  Lab 09/26/19 2234  WBC 8.2  NEUTROABS 6.9  HGB 13.4  HCT 41.3  MCV 90.0  PLT 196   Basic Metabolic Panel: Recent Labs  Lab 09/26/19 2234  NA 132*  K 3.9  CL 99  CO2 23  GLUCOSE 84  BUN 17  CREATININE 1.03  CALCIUM 8.5*   GFR: Estimated Creatinine Clearance: 58.6 mL/min (by C-G formula based on SCr of 1.03 mg/dL). Liver Function Tests: Recent Labs  Lab 09/26/19 2234  AST 37  ALT 21  ALKPHOS 44  BILITOT 0.6  PROT 6.5  ALBUMIN 3.4*   No results for input(s): LIPASE, AMYLASE in the last 168 hours. No results for input(s): AMMONIA in the last 168 hours. Coagulation Profile: No results for input(s): INR, PROTIME in the last 168 hours. Cardiac Enzymes: No results for input(s): CKTOTAL, CKMB, CKMBINDEX, TROPONINI in the last 168 hours. BNP (last 3 results) No results for input(s): PROBNP in the last 8760 hours. HbA1C: No results for input(s): HGBA1C in the last 72 hours. CBG: Recent Labs  Lab 09/27/19 0129  GLUCAP 133*   Lipid Profile: Recent Labs    09/26/19 2234  TRIG 136   Thyroid Function Tests: No results for input(s): TSH, T4TOTAL, FREET4, T3FREE, THYROIDAB in the last 72 hours. Anemia Panel: Recent Labs    09/26/19 2234  FERRITIN 406*   Urine analysis:    Component Value Date/Time   COLORURINE YELLOW 12/03/2016 1435   APPEARANCEUR CLEAR 12/03/2016 1435   LABSPEC 1.020 12/03/2016  1435   PHURINE 5.5 12/03/2016 1435   GLUCOSEU NEGATIVE 12/03/2016 1435   HGBUR NEGATIVE 12/03/2016 1435   BILIRUBINUR NEGATIVE 04/28/2018 0947   KETONESUR NEGATIVE 12/03/2016 1435   PROTEINUR Negative 04/28/2018 0947   UROBILINOGEN 0.2 04/28/2018 0947   UROBILINOGEN 0.2 12/03/2016 1435   NITRITE NEGATIVE 04/28/2018 0947   NITRITE NEGATIVE 12/03/2016  1435   LEUKOCYTESUR Negative 04/28/2018 0947    Radiological Exams on Admission: CT Angio Chest PE W and/or Wo Contrast  Result Date: 09/27/2019 CLINICAL DATA:  Positive D-dimer. EXAM: CT ANGIOGRAPHY CHEST WITH CONTRAST TECHNIQUE: Multidetector CT imaging of the chest was performed using the standard protocol during bolus administration of intravenous contrast. Multiplanar CT image reconstructions and MIPs were obtained to evaluate the vascular anatomy. CONTRAST:  100mL OMNIPAQUE IOHEXOL 350 MG/ML SOLN COMPARISON:  None. FINDINGS: Cardiovascular: Heart is normal size. Aorta is normal caliber. No filling defects in the pulmonary arteries to suggest pulmonary emboli. Mediastinum/Nodes: No mediastinal, hilar, or axillary adenopathy. Trachea and esophagus are unremarkable. Thyroid unremarkable. Lungs/Pleura: Extensive bilateral ground-glass airspace opacities compatible with COVID pneumonia. No effusions or pneumothorax. Upper Abdomen: Imaging into the upper abdomen demonstrates no acute findings. Musculoskeletal: Chest wall soft tissues are unremarkable. No acute bony abnormality. Review of the MIP images confirms the above findings. IMPRESSION: No evidence of pulmonary embolus. Extensive bilateral ground-glass airspace opacities compatible with COVID pneumonia. Electronically Signed   By: Charlett NoseKevin  Dover M.D.   On: 09/27/2019 02:06   DG Chest Port 1 View  Result Date: 09/26/2019 CLINICAL DATA:  COVID EXAM: PORTABLE CHEST 1 VIEW COMPARISON:  None. FINDINGS: Patchy bilateral airspace opacities. Heart is normal size. No effusions. No acute bony abnormality. IMPRESSION: Patchy bilateral airspace disease compatible with COVID pneumonia. Electronically Signed   By: Charlett NoseKevin  Dover M.D.   On: 09/26/2019 22:44    EKG: Independently reviewed.  Sinus rhythm, LAFB, RBBB.  No acute changes.  Assessment/Plan Principal Problem:   Pneumonia Active Problems:   Hypothyroidism   Dyslipidemia associated with type 2 diabetes  mellitus (HCC)   Diabetes mellitus, type II (HCC)   Acute hypoxemic respiratory failure (HCC)   Acute hypoxemic respiratory failure secondary to COVID-19 viral multifocal pneumonia: Tachypneic with respiratory rate in the mid 20s to 30s.  His home pulse ox readings were in the 80s.  Oxygen saturation as low as 88% on room air in the ED, currently satting 96-97% on room air.  Patient tested positive on home Covid test on 8/31. CT showing extensive bilateral groundglass airspace opacities compatible with Covid pneumonia.   Inflammatory markers elevated: D-dimer 1.35, LDH 333, ferritin 406, fibrinogen 784, and CRP 9.2. Bacterial pneumonia less likely given no leukocytosis and procalcitonin <0.10.  does not meet sepsis criteria at this time-no fever, tachycardia, hypotension, or lactic acidosis. -Remdesivir -IV Solu-Medrol 0.5 mg/kg every 12 hours -Vitamin C, zinc, vitamin D -Antitussives as needed -Tylenol as needed -Bronchodilators as needed -Daily CBC with differential, CMP, CRP, D-dimer, LDH -Airborne and contact precautions -Incentive spirometry, flutter valve -Encourage prone positioning -Continuous pulse ox -Supplemental oxygen as needed to keep oxygen saturation above 90% -Blood culture x2 ordered  Non-insulin-dependent type 2 diabetes -Check A1c.  Sliding scale insulin sensitive ACHS and CBG checks.  Hyperlipidemia -Continue home meds  Hypothyroidism -Continue home Synthroid  Hypogonadism -Continue home meds  DVT prophylaxis: Lovenox Code Status: Patient wishes to be full code. Family Communication: No family available at this time. Disposition Plan: Status is: Inpatient  Remains inpatient appropriate because:IV treatments appropriate due to intensity  of illness or inability to take PO and Inpatient level of care appropriate due to severity of illness   Dispo: The patient is from: Home              Anticipated d/c is to: Home              Anticipated d/c date is: 3  days              Patient currently is not medically stable to d/c.  The medical decision making on this patient was of high complexity and the patient is at high risk for clinical deterioration, therefore this is a level 3 visit.  John Giovanni MD [facility]  If 7PM-7AM, please contact night-coverage [email or web address]  09/27/2019, 3:12 AM

## 2019-09-27 NOTE — ED Notes (Signed)
Patient expressed wanting to stay another night. MD made aware.

## 2019-09-27 NOTE — ED Provider Notes (Signed)
Care assumed from Dr. Rhunette Croft.  10 days of cough and URI symptoms with positive Covid test.  Monoclonal antibody infusion 2 days ago.  O2 saturation 85 at home.  Here his O2 saturations have been above 95.  He received Decadron. Mild tachypnea. Speaking full sentences.  Not hypoxic but he is tachypneic.  Will check pulse ox with ambulation.  Patient tachypneic 25-30 but does not desaturate below 93%.  Heart rate in the eighties.  CT scan shows no pulmonary embolism but does show extensive Covid pneumonia.  With his tachypnea and hypoxia at home patient is requesting overnight observation.  Continues on remdesivir and Decadron.  Patient quite concerned about having to stay in the hospital more than 1 day and was informed that this is not predictable as it depends on his course. Discussed with Dr. Loney Loh.    David Octave, MD 09/27/19 [phone]

## 2019-09-27 NOTE — Progress Notes (Signed)
Patient scheduled for outpatient Remdesivir infusions at 1pm on Monday 9/6, Tuesday 9/7, Wednesday 9/8, and Thursday 9/9 at [facility]. Please inform the patient to park at [address], , as staff will be escorting the patient through the east entrance of the hospital. Appointments will take approximately 45 minutes.    There is a wave flag banner located near the entrance on [address] Turn into this entrance and immediately turn left and park in 1 of the 5 designated Covid Infusion Parking spots. There is a phone number on the sign, please call and let the staff know what spot you are in and we will come out and get you. For questions call [phone].  Thanks.  

## 2019-09-27 NOTE — ED Notes (Signed)
SATURATION QUALIFICATIONS: (This note is used to comply with regulatory documentation for home oxygen)  Patient Saturations on Room Air at Rest = 89%  Patient Saturations on Room Air while Ambulating = 88%  Patient Saturations on 2 Liters of oxygen while Ambulating = 92%  Please briefly explain why patient needs home oxygen: Hypoxia when ambulating

## 2019-09-27 NOTE — Progress Notes (Signed)
PROGRESS NOTE  David Maynard QPY:[id number] DOB: October 05, 1950   PCP: Ardith Dark, MD  Patient is from: Home.  DOA: 09/26/2019 LOS: 0  Brief Narrative / Interim history: 69 year old male with history of NIDDM-2, hypothyroidism, BPH, hyperlipidemia and bilateral knee OA status post TKA presenting with hypoxemia to 80s at home.  Had symptoms for 10 days.  Tested positive for COVID-19 on 8/31.  Received monoclonal antibody infusion on 9/2.  In ED, COVID-19 PCR positive.  Tachypneic to 30s.  Saturation 88% on RA.  Pro-Cal negative.  Inflammatory markers elevated.  CXR with patchy bilateral airspace disease compatible COVID-19 pneumonia.  CTA chest negative for PE but extensive bilateral groundglass opacities.  Patient was started on aspirin, Decadron and remdesivir and admitted.   Subjective: Seen and examined earlier this morning.  Patient was adamant about going home if his oxygen does not drop with ambulation.  Reports improvement in his breathing.  He was ambulated in the room by RN earlier in the morning and maintained saturation above 92%.  However, he was noted to desaturate to 88% by staff member and had a repeat ambulatory saturation assessment.  He desaturated to 88% requiring 2 L by [place] to recover to 92%.  Eventually, he changed his mind and decided to stay for treatment.  Objective: Vitals:   09/27/19 0800 09/27/19 1000 09/27/19 1100 09/27/19 1300  BP: 114/81 133/65 116/69 122/74  Pulse: 77 88 76 84  Resp: (!) 26 (!) 27 (!) 23 (!) 28  Temp:      TempSrc:      SpO2: 94% 90% 92% 91%  Weight:      Height:       No intake or output data in the 24 hours ending 09/27/19 1513 Filed Weights   09/26/19 2120  Weight: 61.2 kg    Examination:  GENERAL: No apparent distress.  Nontoxic. HEENT: MMM.  Vision and hearing grossly intact.  NECK: Supple.  No apparent JVD.  RESP:  No IWOB.  Fair aeration bilaterally. CVS:  RRR. Heart sounds normal.  ABD/GI/GU: BS+. Abd soft, NTND.    MSK/EXT:  Moves extremities. No apparent deformity. No edema.  SKIN: no apparent skin lesion or wound NEURO: Awake, alert and oriented appropriately.  No apparent focal neuro deficit. PSYCH: Calm. Normal affect.   Procedures:  None  Microbiology summarized: COVID-19 PCR positive.  Assessment & Plan: Acute respiratory failure with hypoxia due to COVID-19 pneumonia: Symptomatic for about 10 days prior to arrival.  Tested positive on 8/31.  Monoclonal antibody infusion on 9/2.  Hypoxemic to 80s at home, and 88% in ED. inflammatory markers elevated.  CTA negative for PE but bilateral GG.  Still tachypneic to upper 20s.  Desaturated to 88% with ambulation.  Pro-Cal negative. Recent Labs    09/26/19 2234  DDIMER 1.35*  FERRITIN 406*  LDH 333*  CRP 9.2*  -Continue IV Solu-Medrol and remdesivir 9/5>> -P.o. Protonix for GI prophylaxis -Supportive care with supplemental oxygen, inhalers, mucolytic's, antitussives, vitamins, incentive spirometry -Monitor inflammatory markers  Controlled NIDDM-2 with hyperglycemia: Hyperglycemia likely due to steroid.  A1c 6.1%. Recent Labs  Lab 09/27/19 0129 09/27/19 0758 09/27/19 1147  GLUCAP 133* 200* 306*  -Increase SSI to moderate -Add NovoLog AC 4 units -Add Tradjenta -Continue home statin.  Hypothyroidism -Continue home Synthroid  Hypogonadism -Continue home clomiphene and bromocriptine  Hyperlipidemia -Continue home WelChol  Osteoarthritis -As needed Tylenol  Overactive bladder -Continue home Myrbetriq   Body mass index is 20.53 kg/m.  DVT prophylaxis:  enoxaparin (LOVENOX) injection 40 mg Start: 09/27/19 1000  Code Status: Full code Family Communication: Updated patient's wife over the phone. Status is: Inpatient.   Remains inpatient appropriate because:IV treatments appropriate due to intensity of illness or inability to take PO and Inpatient level of care appropriate due to severity of illness   Dispo: The  patient is from: Home              Anticipated d/c is to: Home              Anticipated d/c date is: 3 days              Patient currently is not medically stable to d/c.       Consultants:  None   Sch Meds:  Scheduled Meds: . vitamin C  500 mg Oral Daily  . bromocriptine  2.5 mg Oral Daily  . cholecalciferol  1,000 Units Oral Daily  . clomiPHENE  25 mg Oral Daily  . colesevelam  2,500 mg Oral Daily  . enoxaparin (LOVENOX) injection  40 mg Subcutaneous Q24H  . ferrous sulfate  325 mg Oral BID PC  . insulin aspart  0-15 Units Subcutaneous TID WC  . insulin aspart  0-5 Units Subcutaneous QHS  . insulin aspart  4 Units Subcutaneous TID WC  . levothyroxine  75 mcg Oral Daily  . methylPREDNISolone (SOLU-MEDROL) injection  0.5 mg/kg Intravenous Q12H   Followed by  . [START ON 09/30/2019] predniSONE  50 mg Oral Daily  . mirabegron ER  50 mg Oral Daily  . omega-3 acid ethyl esters  2 capsule Oral BID  . simvastatin  10 mg Oral QHS  . zinc sulfate  220 mg Oral Daily   Continuous Infusions: . [START ON 09/28/2019] remdesivir 100 mg in NS 100 mL     PRN Meds:.acetaminophen, albuterol, chlorpheniramine-HYDROcodone, guaiFENesin-dextromethorphan  Antimicrobials: Anti-infectives (From admission, onward)   Start     Dose/Rate Route Frequency Ordered Stop   09/28/19 1000  remdesivir 100 mg in sodium chloride 0.9 % 100 mL IVPB       "Followed by" Linked Group Details   100 mg 200 mL/hr over 30 Minutes Intravenous Daily 09/27/19 0049 10/02/19 0959   09/27/19 0100  remdesivir 200 mg in sodium chloride 0.9% 250 mL IVPB       "Followed by" Linked Group Details   200 mg 580 mL/hr over 30 Minutes Intravenous Once 09/27/19 0049 09/27/19 0200       I have personally reviewed the following labs and images: CBC: Recent Labs  Lab 09/26/19 2234  WBC 8.2  NEUTROABS 6.9  HGB 13.4  HCT 41.3  MCV 90.0  PLT 196   BMP &GFR Recent Labs  Lab 09/26/19 2234  NA 132*  K 3.9  CL 99  CO2  23  GLUCOSE 84  BUN 17  CREATININE 1.03  CALCIUM 8.5*   Estimated Creatinine Clearance: 58.6 mL/min (by C-G formula based on SCr of 1.03 mg/dL). Liver & Pancreas: Recent Labs  Lab 09/26/19 2234  AST 37  ALT 21  ALKPHOS 44  BILITOT 0.6  PROT 6.5  ALBUMIN 3.4*   No results for input(s): LIPASE, AMYLASE in the last 168 hours. No results for input(s): AMMONIA in the last 168 hours. Diabetic: Recent Labs    09/27/19 0430  HGBA1C 6.1*   Recent Labs  Lab 09/27/19 0129 09/27/19 0758 09/27/19 1147  GLUCAP 133* 200* 306*   Cardiac Enzymes: No results for input(s):  CKTOTAL, CKMB, CKMBINDEX, TROPONINI in the last 168 hours. No results for input(s): PROBNP in the last 8760 hours. Coagulation Profile: No results for input(s): INR, PROTIME in the last 168 hours. Thyroid Function Tests: No results for input(s): TSH, T4TOTAL, FREET4, T3FREE, THYROIDAB in the last 72 hours. Lipid Profile: Recent Labs    09/26/19 2234  TRIG 136   Anemia Panel: Recent Labs    09/26/19 2234  FERRITIN 406*   Urine analysis:    Component Value Date/Time   COLORURINE YELLOW 12/03/2016 1435   APPEARANCEUR CLEAR 12/03/2016 1435   LABSPEC 1.020 12/03/2016 1435   PHURINE 5.5 12/03/2016 1435   GLUCOSEU NEGATIVE 12/03/2016 1435   HGBUR NEGATIVE 12/03/2016 1435   BILIRUBINUR NEGATIVE 04/28/2018 0947   KETONESUR NEGATIVE 12/03/2016 1435   PROTEINUR Negative 04/28/2018 0947   UROBILINOGEN 0.2 04/28/2018 0947   UROBILINOGEN 0.2 12/03/2016 1435   NITRITE NEGATIVE 04/28/2018 0947   NITRITE NEGATIVE 12/03/2016 1435   LEUKOCYTESUR Negative 04/28/2018 0947   Sepsis Labs: Invalid input(s): PROCALCITONIN, LACTICIDVEN  Microbiology: Recent Results (from the past 240 hour(s))  SARS Coronavirus 2 by RT PCR (hospital order, performed in [facility] hospital lab) Nasopharyngeal Nasopharyngeal Swab     Status: Abnormal   Collection Time: 09/27/19 12:20 AM   Specimen: Nasopharyngeal Swab  Result Value  Ref Range Status   SARS Coronavirus 2 POSITIVE (A) NEGATIVE Final    Comment: RESULT CALLED TO, READ BACK BY AND VERIFIED WITH: J,TALKINGTON AT 0705 ON 09/27/19 BY A,MOHAMED (NOTE) SARS-CoV-2 target nucleic acids are DETECTED  SARS-CoV-2 RNA is generally detectable in upper respiratory specimens  during the acute phase of infection.  Positive results are indicative  of the presence of the identified virus, but do not rule out bacterial infection or co-infection with other pathogens not detected by the test.  Clinical correlation with patient history and  other diagnostic information is necessary to determine patient infection status.  The expected result is negative.  Fact Sheet for Patients:   BoilerBrush.com.cy   Fact Sheet for Healthcare Providers:   https://pope.com/    This test is not yet approved or cleared by the [place] FDA and  has been authorized for detection and/or diagnosis of SARS-CoV-2 by FDA under an Emergency Use Authorization (EUA).  This EUA will remain in effect (meanin g this test can be used) for the duration of  the COVID-19 declaration under Section 564(b)(1) of the Act, 21 U.S.C. section 360-bbb-3(b)(1), unless the authorization is terminated or revoked sooner.  Performed at [facility], [address] [place], [place] [address]     Radiology Studies: CT Angio Chest PE W and/or Wo Contrast  Result Date: 09/27/2019 CLINICAL DATA:  Positive D-dimer. EXAM: CT ANGIOGRAPHY CHEST WITH CONTRAST TECHNIQUE: Multidetector CT imaging of the chest was performed using the standard protocol during bolus administration of intravenous contrast. Multiplanar CT image reconstructions and MIPs were obtained to evaluate the vascular anatomy. CONTRAST:  OMNIPAQUE IOHEXOL 350 MG/ML SOLN COMPARISON:  None. FINDINGS: Cardiovascular: Heart is normal size. Aorta is normal caliber. No filling defects in  the pulmonary arteries to suggest pulmonary emboli. Mediastinum/Nodes: No mediastinal, hilar, or axillary adenopathy. Trachea and esophagus are unremarkable. Thyroid unremarkable. Lungs/Pleura: Extensive bilateral ground-glass airspace opacities compatible with COVID pneumonia. No effusions or pneumothorax. Upper Abdomen: Imaging into the upper abdomen demonstrates no acute findings. Musculoskeletal: Chest wall soft tissues are unremarkable. No acute bony abnormality. Review of the MIP images confirms the above findings. IMPRESSION: No evidence of  pulmonary embolus. Extensive bilateral ground-glass airspace opacities compatible with COVID pneumonia. Electronically Signed   By: Charlett NoseKevin  Dover M.D.   On: 09/27/2019 02:06   DG Chest Port 1 View  Result Date: 09/26/2019 CLINICAL DATA:  COVID EXAM: PORTABLE CHEST 1 VIEW COMPARISON:  None. FINDINGS: Patchy bilateral airspace opacities. Heart is normal size. No effusions. No acute bony abnormality. IMPRESSION: Patchy bilateral airspace disease compatible with COVID pneumonia. Electronically Signed   By: Charlett NoseKevin  Dover M.D.   On: 09/26/2019 22:44   No charge service   Anevay Campanella T. Jezlyn Westerfield [facility]  If 7PM-7AM, please contact night-coverage [email or web address] 09/27/2019, 3:13 PM

## 2019-09-27 NOTE — Discharge Instructions (Signed)
Patient scheduled for outpatient Remdesivir infusions at 1pm on Monday 9/6, Tuesday 9/7, Wednesday 9/8, and Thursday 9/9 at [facility]. Please inform the patient to park at [address] [place], [place], as staff will be escorting the patient through the east entrance of the hospital. Appointments will take approximately 45 minutes.    There is a wave flag banner located near the entrance on [address]. Turn into this entrance and immediately turn left and park in 1 of the 5 designated Covid Infusion Parking spots. There is a phone number on the sign, please call and let the staff know what spot you are in and we will come out and get you. For questions call [phone].  Thanks.

## 2019-09-27 NOTE — TOC Initial Note (Addendum)
Transition of Care [facility]) - Initial/Assessment Note    Patient Details  Name: David Maynard MRN: [id number] Date of Birth: 1950-11-27  Transition of Care [facility]) CM/SW Contact:    Elliot Cousin, RN Phone Number: [phone] 09/27/2019, 1:31 PM  Clinical Narrative:                 TOC CM spoke to pt and gave permission to speak to wife. Wife states she will be at home to accept oxygen when delivered. She will bring portable when she arrives to pick pt up at dc. Waiting documentation and orders for oxygen. Will contact [employer] rep, James for oxygen set up.  Faxed demographics to [employer], they will be able to deliver day of dc. Will need orders. Attending updated.    Expected Discharge Plan: Home/Self Care Barriers to Discharge: Continued Medical Work up   Patient Goals and CMS Choice Patient states their goals for this hospitalization and ongoing recovery are:: prefers to go home CMS Medicare.gov Compare Post Acute Care list provided to:: Patient Choice offered to / list presented to : Spouse  Expected Discharge Plan and Services Expected Discharge Plan: Home/Self Care In-house Referral: Clinical Social Work Discharge Planning Services: CM Consult Post Acute Care Choice: Durable Medical Equipment Living arrangements for the past 2 months: Single Family Home Expected Discharge Date: 09/27/19               DME Arranged: Oxygen                    Prior Living Arrangements/Services Living arrangements for the past 2 months: Single Family Home Lives with:: Spouse Patient language and need for interpreter reviewed:: Yes Do you feel safe going back to the place where you live?: Yes      Need for Family Participation in Patient Care: Yes (Comment) Care giver support system in place?: Yes (comment)   Criminal Activity/Legal Involvement Pertinent to Current Situation/Hospitalization: No - Comment as needed  Activities of Daily Living      Permission  Sought/Granted Permission sought to share information with : Case Manager, PCP, Family Supports Permission granted to share information with : Yes, Verbal Permission Granted  Share Information with NAME: David Maynard  Permission granted to share info w AGENCY: oxygen DME company  Permission granted to share info w Relationship: wife  Permission granted to share info w Contact Information: [phone]  Emotional Assessment Appearance:: Appears stated age Attitude/Demeanor/Rapport: Engaged Affect (typically observed): Accepting Orientation: : Oriented to Self, Oriented to Place, Oriented to  Time, Oriented to Situation   Psych Involvement: No (comment)  Admission diagnosis:  Pneumonia [J18.9] Patient Active Problem List   Diagnosis Date Noted  . Pneumonia 09/27/2019  . Acute hypoxemic respiratory failure (HCC) 09/27/2019  . Gastroesophageal reflux disease 01/13/2019  . BPH  01/08/2018  . ED (erectile dysfunction) 01/08/2018  . OAB (overactive bladder) 01/08/2018  . History of bilateral knee replacement 12/11/2017  . Lumbar disc disease 09/22/2010  . Diabetes mellitus, type II (HCC)   . Hypogonadism male 06/19/2007  . Dyslipidemia associated with type 2 diabetes mellitus (HCC) 06/19/2007  . Hypothyroidism 03/05/2007   PCP:  Ardith Dark, MD Pharmacy:   [facility] [phone] [place], [place] - [phone] AT [facility] OF [facility] [address] [place] [address] Phone: [phone] Fax: [phone]  [facility] [place], [place] - [address] [place], [address] [place],  [address] [place] [address] Phone: [phone] Fax: [phone]     Social Determinants of Health (SDOH) Interventions    Readmission Risk Interventions No flowsheet data found.

## 2019-09-27 NOTE — ED Notes (Signed)
Patient given meal tray.

## 2019-09-27 NOTE — ED Notes (Signed)
Pt ambulated in room for approx two minutes. Saturation remained above 93% on RA. Pt has been tachypneic with a RR of 25-35 since arrival. HR at rest is in 80s. Upon ambulation HR went up to 98-100.

## 2019-09-27 NOTE — ED Notes (Signed)
Patient provided with meal tray. Oxygen saturation noted to be 87%-89% on room air while sitting up preparing to eat.

## 2019-09-27 NOTE — ED Notes (Addendum)
Pt ambulated around the room on RA. Pt's pulse ox remained 94-96% most of the time. At one point, pulse ox dropped to 92% but easily recovered once pt stopped ambulating. Hospitalist made aware.

## 2019-09-27 NOTE — Care Management CC44 (Signed)
Condition Code 44 Documentation Completed  Patient Details  Name: David Maynard MRN: [id number] Date of Birth: 01-26-1950   Condition Code 44 given:  Yes Patient signature on Condition Code 44 notice:  Yes Documentation of 2 MD's agreement:  Yes Code 44 added to claim:  Yes    Elliot Cousin, RN 09/27/2019, 1:24 PM

## 2019-09-27 NOTE — ED Notes (Signed)
After ambulation with oxygen pt sat back down into bed and his oxygen was removed, pt oxygen level dropped to 86% on room air.

## 2019-09-28 ENCOUNTER — Other Ambulatory Visit: Payer: Self-pay

## 2019-09-28 ENCOUNTER — Other Ambulatory Visit: Payer: Self-pay | Admitting: Nurse Practitioner

## 2019-09-28 ENCOUNTER — Ambulatory Visit (HOSPITAL_COMMUNITY): Payer: Medicare Other

## 2019-09-28 DIAGNOSIS — E1169 Type 2 diabetes mellitus with other specified complication: Secondary | ICD-10-CM

## 2019-09-28 DIAGNOSIS — E785 Hyperlipidemia, unspecified: Secondary | ICD-10-CM

## 2019-09-28 DIAGNOSIS — E871 Hypo-osmolality and hyponatremia: Secondary | ICD-10-CM

## 2019-09-28 DIAGNOSIS — U071 COVID-19: Principal | ICD-10-CM

## 2019-09-28 LAB — COMPREHENSIVE METABOLIC PANEL
ALT: 19 U/L (ref 0–44)
AST: 26 U/L (ref 15–41)
Albumin: 3 g/dL — ABNORMAL LOW (ref 3.5–5.0)
Alkaline Phosphatase: 43 U/L (ref 38–126)
Anion gap: 10 (ref 5–15)
BUN: 24 mg/dL — ABNORMAL HIGH (ref 8–23)
CO2: 23 mmol/L (ref 22–32)
Calcium: 8.7 mg/dL — ABNORMAL LOW (ref 8.9–10.3)
Chloride: 101 mmol/L (ref 98–111)
Creatinine, Ser: 1.02 mg/dL (ref 0.61–1.24)
GFR calc Af Amer: >60 mL/min (ref >60)
GFR calc non Af Amer: >60 mL/min (ref >60)
Glucose, Bld: 192 mg/dL — ABNORMAL HIGH (ref 70–99)
Potassium: 4.4 mmol/L (ref 3.5–5.1)
Sodium: 134 mmol/L — ABNORMAL LOW (ref 135–145)
Total Bilirubin: 0.2 mg/dL — ABNORMAL LOW (ref 0.3–1.2)
Total Protein: 6.2 g/dL — ABNORMAL LOW (ref 6.5–8.1)

## 2019-09-28 LAB — D-DIMER, QUANTITATIVE: D-Dimer, Quant: 1.11 ug/mL-FEU — ABNORMAL HIGH (ref 0.00–0.50)

## 2019-09-28 LAB — CBC WITH DIFFERENTIAL/PLATELET
Abs Immature Granulocytes: 0.11 10*3/uL — ABNORMAL HIGH (ref 0.00–0.07)
Basophils Absolute: 0 10*3/uL (ref 0.0–0.1)
Basophils Relative: 0 %
Eosinophils Absolute: 0.1 10*3/uL (ref 0.0–0.5)
Eosinophils Relative: 0 %
HCT: 39.3 % (ref 39.0–52.0)
Hemoglobin: 13.3 g/dL (ref 13.0–17.0)
Immature Granulocytes: 1 %
Lymphocytes Relative: 5 %
Lymphs Abs: 0.8 10*3/uL (ref 0.7–4.0)
MCH: 30.2 pg (ref 26.0–34.0)
MCHC: 33.8 g/dL (ref 30.0–36.0)
MCV: 89.3 fL (ref 80.0–100.0)
Monocytes Absolute: 0.4 10*3/uL (ref 0.1–1.0)
Monocytes Relative: 3 %
Neutro Abs: 14.2 10*3/uL — ABNORMAL HIGH (ref 1.7–7.7)
Neutrophils Relative %: 91 %
Platelets: 227 10*3/uL (ref 150–400)
RBC: 4.4 MIL/uL (ref 4.22–5.81)
RDW: 13.6 % (ref 11.5–15.5)
WBC: 15.6 10*3/uL — ABNORMAL HIGH (ref 4.0–10.5)
nRBC: 0 % (ref 0.0–0.2)

## 2019-09-28 LAB — CBG MONITORING, ED: Glucose-Capillary: 168 mg/dL — ABNORMAL HIGH (ref 70–99)

## 2019-09-28 LAB — C-REACTIVE PROTEIN: CRP: 6.1 mg/dL — ABNORMAL HIGH (ref <1.0)

## 2019-09-28 LAB — GLUCOSE, CAPILLARY
Glucose-Capillary: 182 mg/dL — ABNORMAL HIGH (ref 70–99)
Glucose-Capillary: 180 mg/dL — ABNORMAL HIGH (ref 70–99)

## 2019-09-28 NOTE — Evaluation (Signed)
Physical Therapy Treatment Patient Details Name: David Maynard MRN: [id number] DOB: [birth year] Today's Date: 09/28/2019    History of Present Illness 69 yo male admitted with Acute respiratory failure with hypoxia due to COVID-19 pneumonia: Symptomatic for about 10 days prior to arrival.  Tested positive on 8/31.  Monoclonal antibody infusion on 9/2.  Hypoxemic to 80s at home, and 88% in ED. inflammatory markers elevated.  CTA negative for PE but bilateral GG.  Still tachypneic to upper 20s.  Desaturated to 88% with ambulation.    PT Comments    Pt admitted with above diagnosis.  Pt amb short distance in room today, desats to ~ 86-87% on RA.  Reviewed diaphragmatic breathing/recovery. replaced O2 at 2L, pt recovered to 90% or greater in ~ 1 minute.  Discussed proning and rationale, pt declines to prone at this time. Prefers to be upright on stretcher. Will continue to follow in acute setting for incr  mobility and and education.  Pt currently with functional limitations due to the deficits listed below (see PT Problem List). Pt will benefit from skilled PT to increase their independence and safety with mobility to allow discharge to the venue listed below.      Follow Up Recommendations  No PT follow up     Equipment Recommendations  None recommended by PT    Recommendations for Other Services       Precautions / Restrictions Precautions Precautions: Fall Precaution Comments: monitor  O2 Restrictions Weight Bearing Restrictions: No    Mobility  Bed Mobility Overal bed mobility: Needs Assistance Bed Mobility: Supine to Sit;Sit to Supine     Supine to sit: Supervision Sit to supine: Supervision   General bed mobility comments: for safety and line management  Transfers Overall transfer level: Needs assistance Equipment used: Rolling walker (2 wheeled) Transfers: Sit to/from Stand Sit to Stand: Min guard;Min assist         General transfer comment: unsteady on initial  standing  Ambulation/Gait Ambulation/Gait assistance: Min assist;Min guard Gait Distance (Feet): 40 Feet (in room) Assistive device: 1 person hand held assist;None Gait Pattern/deviations: Step-through pattern;Decreased stride length     General Gait Details: min to min/guard for balance/saety, LOB x1, 2-3/4 DOE  after distance above   Social research officer, government Rankin (Stroke Patients Only)       Balance Overall balance assessment: Needs assistance Sitting-balance support: Feet supported;No upper extremity supported Sitting balance-Leahy Scale: Good       Standing balance-Leahy Scale: Fair                              Cognition Arousal/Alertness: Awake/alert Behavior During Therapy: WFL for tasks assessed/performed Overall Cognitive Status: Within Functional Limits for tasks assessed                                 General Comments: anxious with SOB/DOE      Exercises      General Comments        Pertinent Vitals/Pain Pain Assessment: No/denies pain    Home Living Family/patient expects to be discharged to:: Private residence Living Arrangements: Spouse/significant other Available Help at Discharge: Family;Available 24 hours/day (spouse is Covid negative) Type of Home: House Home Access: Stairs to enter Entrance Stairs-Rails: Right;Left;Can reach both Home Layout: Two level;Able  to live on main level with bedroom/bathroom Home Equipment: Dan Humphreys - 2 wheels;Cane - single point;Bedside commode      Prior Function Level of Independence: Independent      Comments: active, plays tennis and golf   PT Goals (current goals can now be found in the care plan section) Acute Rehab PT Goals Patient Stated Goal: get better, back to being active PT Goal Formulation: With patient Time For Goal Achievement: 10/12/19 Potential to Achieve Goals: Good    Frequency    Min 3X/week      PT Plan       Co-evaluation              AM-PAC PT "6 Clicks" Mobility   Outcome Measure  Help needed turning from your back to your side while in a flat bed without using bedrails?: A Little Help needed moving from lying on your back to sitting on the side of a flat bed without using bedrails?: A Little Help needed moving to and from a bed to a chair (including a wheelchair)?: A Little Help needed standing up from a chair using your arms (e.g., wheelchair or bedside chair)?: A Little Help needed to walk in hospital room?: A Little Help needed climbing 3-5 steps with a railing? : A Little 6 Click Score: 18    End of Session   Activity Tolerance: Patient tolerated treatment well Patient left: with call bell/phone within reach;in bed   PT Visit Diagnosis: Difficulty in walking, not elsewhere classified (R26.2)     Time: 1438-8875 PT Time Calculation (min) (ACUTE ONLY): 25 min  Charges:  $Gait Training: 8-22 mins                     Delice Bison, PT  Acute Rehab Dept ([facility]) [phone] Pager [phone]  09/28/2019    [facility] 09/28/2019, 3:48 PM

## 2019-09-28 NOTE — ED Notes (Signed)
Physical Therapy at bedside.

## 2019-09-28 NOTE — Progress Notes (Signed)
PROGRESS NOTE  David Maynard WGN:[id number] DOB: 04-16-1950   PCP: Ardith Dark, MD  Patient is from: Home.  DOA: 09/26/2019 LOS: 1  Brief Narrative / Interim history: 68 year old male with history of NIDDM-2, hypothyroidism, BPH, hyperlipidemia and bilateral knee OA status post TKA presenting with hypoxemia to 80s at home.  Had symptoms for 10 days.  Tested positive for COVID-19 on 8/31.  Received monoclonal antibody infusion on 9/2.  In ED, COVID-19 PCR positive.  Tachypneic to 30s.  Saturation 88% on RA.  Pro-Cal negative.  Inflammatory markers elevated.  CXR with patchy bilateral airspace disease compatible COVID-19 pneumonia.  CTA chest negative for PE but extensive bilateral groundglass opacities.  Patient was started on aspirin, Decadron and remdesivir and admitted.  Subjective: Seen and examined earlier this morning.  No major events overnight of this morning.  Reports feeling better.  No complaints.  Currently saturating low 90s to mid 90s on 2 L by [place].  Inflammatory markers downtrending.  Denies chest pain, dyspnea, GI or UTI symptoms.  Extensive discussion about his care.  Objective: Vitals:   09/28/19 0600 09/28/19 0800 09/28/19 1000 09/28/19 1200  BP: 133/82 (!) 161/83 (!) 159/77 128/75  Pulse: 67 77 (!) 101 89  Resp: (!) 23 20 (!) 30 (!) 29  Temp:      TempSrc:      SpO2: 92% 95% 96% 97%  Weight:      Height:        Intake/Output Summary (Last 24 hours) at 09/28/2019 1309 Last data filed at 09/28/2019 1112 Gross per 24 hour  Intake 118.46 ml  Output --  Net 118.46 ml   Filed Weights   09/26/19 2120  Weight: 61.2 kg    Examination:  GENERAL: No apparent distress.  Nontoxic. HEENT: MMM.  Vision and hearing grossly intact.  NECK: Supple.  No apparent JVD.  RESP: 92 to 96% on 2 L.  No IWOB.  Crackles bilaterally. CVS:  RRR. Heart sounds normal.  ABD/GI/GU: BS+. Abd soft, NTND.  MSK/EXT:  Moves extremities. No apparent deformity. No edema.  SKIN: no apparent  skin lesion or wound NEURO: Awake, alert and oriented appropriately.  No apparent focal neuro deficit. PSYCH: Calm but became tearful toward the end after talking about the disease and treatment course  Procedures:  None  Microbiology summarized: COVID-19 PCR positive.  Assessment & Plan: Acute respiratory failure with hypoxia due to COVID-19 pneumonia: Symptomatic for about 10 days prior to arrival.  Tested positive on 8/31.  Monoclonal antibody infusion on 9/2.  Hypoxemic to 80s at home, and 88% in ED. Inflammatory markers elevated but downtrending.  CTA negative for PE but bilateral GG.  Still tachypneic in the 20s.  Requiring supplemental oxygen.  Pro-Cal negative. Recent Labs    09/26/19 2234 09/28/19 0656  DDIMER 1.35* 1.11*  FERRITIN 406*  --   LDH 333*  --   CRP 9.2* 6.1*  -Continue IV Solu-Medrol and remdesivir 9/5>> -P.o. Protonix for GI prophylaxis -Supportive care with supplemental oxygen, inhalers, mucolytic's, antitussives, vitamins, incentive spirometry -Monitor inflammatory markers  Controlled NIDDM-2 with hyperglycemia: hyperglycemia likely due to steroid.  A1c 6.1%. Recent Labs  Lab 09/27/19 0129 09/27/19 0758 09/27/19 1147 09/27/19 1824 09/27/19 2115  GLUCAP 133* 200* 306* 214* 153*  -Continue SSI-moderate -Continue NovoLog AC 4 units -Continue Tradjenta -Continue home statin.  Mild hyponatremia: Na 134.  Improving.  Hypothyroidism -Continue home Synthroid  Hypogonadism -Continue home clomiphene and bromocriptine  Hyperlipidemia -Continue home WelChol  Osteoarthritis -  As needed Tylenol  Overactive bladder -Continue home Myrbetriq  Leukocytosis/bandemia: Likely demargination from steroid.   Body mass index is 20.53 kg/m.         DVT prophylaxis:  enoxaparin (LOVENOX) injection 40 mg Start: 09/27/19 1000  Code Status: Full code Family Communication: Updated patient's wife over the phone. Status is: Inpatient.   Remains inpatient  appropriate because:IV treatments appropriate due to intensity of illness or inability to take PO and Inpatient level of care appropriate due to severity of illness   Dispo: The patient is from: Home              Anticipated d/c is to: Home              Anticipated d/c date is: 3 days              Patient currently is not medically stable to d/c.    Consultants:  None   Sch Meds:  Scheduled Meds: . vitamin C  500 mg Oral Daily  . bromocriptine  2.5 mg Oral Daily  . cholecalciferol  1,000 Units Oral Daily  . clomiPHENE  25 mg Oral Daily  . colesevelam  2,500 mg Oral Daily  . enoxaparin (LOVENOX) injection  40 mg Subcutaneous Q24H  . ferrous sulfate  325 mg Oral BID PC  . insulin aspart  0-15 Units Subcutaneous TID WC  . insulin aspart  0-5 Units Subcutaneous QHS  . insulin aspart  4 Units Subcutaneous TID WC  . levothyroxine  75 mcg Oral Daily  . linagliptin  5 mg Oral Daily  . methylPREDNISolone (SOLU-MEDROL) injection  0.5 mg/kg Intravenous Q12H   Followed by  . [START ON 09/30/2019] predniSONE  50 mg Oral Daily  . mirabegron ER  50 mg Oral Daily  . omega-3 acid ethyl esters  2 capsule Oral BID  . simvastatin  10 mg Oral QHS  . zinc sulfate  220 mg Oral Daily   Continuous Infusions: . remdesivir 100 mg in NS 100 mL Stopped (09/28/19 1105)   PRN Meds:.acetaminophen, albuterol, chlorpheniramine-HYDROcodone, guaiFENesin-dextromethorphan  Antimicrobials: Anti-infectives (From admission, onward)   Start     Dose/Rate Route Frequency Ordered Stop   09/28/19 1000  remdesivir 100 mg in sodium chloride 0.9 % 100 mL IVPB       "Followed by" Linked Group Details   100 mg 200 mL/hr over 30 Minutes Intravenous Daily 09/27/19 0049 10/02/19 0959   09/27/19 0100  remdesivir 200 mg in sodium chloride 0.9% 250 mL IVPB       "Followed by" Linked Group Details   200 mg 580 mL/hr over 30 Minutes Intravenous Once 09/27/19 0049 09/27/19 0200       I have personally reviewed the  following labs and images: CBC: Recent Labs  Lab 09/26/19 2234 09/28/19 0656  WBC 8.2 15.6*  NEUTROABS 6.9 14.2*  HGB 13.4 13.3  HCT 41.3 39.3  MCV 90.0 89.3  PLT 196 227   BMP &GFR Recent Labs  Lab 09/26/19 2234 09/28/19 0656  NA 132* 134*  K 3.9 4.4  CL 99 101  CO2 23 23  GLUCOSE 84 192*  BUN 17 24*  CREATININE 1.03 1.02  CALCIUM 8.5* 8.7*   Estimated Creatinine Clearance: 59.2 mL/min (by C-G formula based on SCr of 1.02 mg/dL). Liver & Pancreas: Recent Labs  Lab 09/26/19 2234 09/28/19 0656  AST 37 26  ALT 21 19  ALKPHOS 44 43  BILITOT 0.6 0.2*  PROT 6.5 6.2*  ALBUMIN 3.4*  3.0*   No results for input(s): LIPASE, AMYLASE in the last 168 hours. No results for input(s): AMMONIA in the last 168 hours. Diabetic: Recent Labs    09/27/19 0430  HGBA1C 6.1*   Recent Labs  Lab 09/27/19 0129 09/27/19 0758 09/27/19 1147 09/27/19 1824 09/27/19 2115  GLUCAP 133* 200* 306* 214* 153*   Cardiac Enzymes: No results for input(s): CKTOTAL, CKMB, CKMBINDEX, TROPONINI in the last 168 hours. No results for input(s): PROBNP in the last 8760 hours. Coagulation Profile: No results for input(s): INR, PROTIME in the last 168 hours. Thyroid Function Tests: No results for input(s): TSH, T4TOTAL, FREET4, T3FREE, THYROIDAB in the last 72 hours. Lipid Profile: Recent Labs    09/26/19 2234  TRIG 136   Anemia Panel: Recent Labs    09/26/19 2234  FERRITIN 406*   Urine analysis:    Component Value Date/Time   COLORURINE YELLOW 12/03/2016 1435   APPEARANCEUR CLEAR 12/03/2016 1435   LABSPEC 1.020 12/03/2016 1435   PHURINE 5.5 12/03/2016 1435   GLUCOSEU NEGATIVE 12/03/2016 1435   HGBUR NEGATIVE 12/03/2016 1435   BILIRUBINUR NEGATIVE 04/28/2018 0947   KETONESUR NEGATIVE 12/03/2016 1435   PROTEINUR Negative 04/28/2018 0947   UROBILINOGEN 0.2 04/28/2018 0947   UROBILINOGEN 0.2 12/03/2016 1435   NITRITE NEGATIVE 04/28/2018 0947   NITRITE NEGATIVE 12/03/2016 1435    LEUKOCYTESUR Negative 04/28/2018 0947   Sepsis Labs: Invalid input(s): PROCALCITONIN, LACTICIDVEN  Microbiology: Recent Results (from the past 240 hour(s))  SARS Coronavirus 2 by RT PCR (hospital order, performed in [facility] hospital lab) Nasopharyngeal Nasopharyngeal Swab     Status: Abnormal   Collection Time: 09/27/19 12:20 AM   Specimen: Nasopharyngeal Swab  Result Value Ref Range Status   SARS Coronavirus 2 POSITIVE (A) NEGATIVE Final    Comment: RESULT CALLED TO, READ BACK BY AND VERIFIED WITH: J,TALKINGTON AT 0705 ON 09/27/19 BY A,MOHAMED (NOTE) SARS-CoV-2 target nucleic acids are DETECTED  SARS-CoV-2 RNA is generally detectable in upper respiratory specimens  during the acute phase of infection.  Positive results are indicative  of the presence of the identified virus, but do not rule out bacterial infection or co-infection with other pathogens not detected by the test.  Clinical correlation with patient history and  other diagnostic information is necessary to determine patient infection status.  The expected result is negative.  Fact Sheet for Patients:   BoilerBrush.com.cy   Fact Sheet for Healthcare Providers:   https://pope.com/    This test is not yet approved or cleared by the [place] FDA and  has been authorized for detection and/or diagnosis of SARS-CoV-2 by FDA under an Emergency Use Authorization (EUA).  This EUA will remain in effect (meanin g this test can be used) for the duration of  the COVID-19 declaration under Section 564(b)(1) of the Act, 21 U.S.C. section 360-bbb-3(b)(1), unless the authorization is terminated or revoked sooner.  Performed at [facility], [address] [place], [place] [address]     Radiology Studies: No results found. No charge service   Thressa Shiffer T. [facility]  If 7PM-7AM, please contact night-coverage [email or web address] 09/28/2019, 1:09 PM

## 2019-09-29 ENCOUNTER — Ambulatory Visit (HOSPITAL_COMMUNITY): Payer: Medicare Other

## 2019-09-29 ENCOUNTER — Telehealth: Payer: Self-pay | Admitting: Family Medicine

## 2019-09-29 LAB — GLUCOSE, CAPILLARY
Glucose-Capillary: 207 mg/dL — ABNORMAL HIGH (ref 70–99)
Glucose-Capillary: 113 mg/dL — ABNORMAL HIGH (ref 70–99)
Glucose-Capillary: 221 mg/dL — ABNORMAL HIGH (ref 70–99)
Glucose-Capillary: 113 mg/dL — ABNORMAL HIGH (ref 70–99)

## 2019-09-29 LAB — COMPREHENSIVE METABOLIC PANEL
ALT: 19 U/L (ref 0–44)
AST: 25 U/L (ref 15–41)
Albumin: 2.8 g/dL — ABNORMAL LOW (ref 3.5–5.0)
Alkaline Phosphatase: 43 U/L (ref 38–126)
Anion gap: 8 (ref 5–15)
BUN: 27 mg/dL — ABNORMAL HIGH (ref 8–23)
CO2: 22 mmol/L (ref 22–32)
Calcium: 8.3 mg/dL — ABNORMAL LOW (ref 8.9–10.3)
Chloride: 102 mmol/L (ref 98–111)
Creatinine, Ser: 0.87 mg/dL (ref 0.61–1.24)
GFR calc Af Amer: >60 mL/min (ref >60)
GFR calc non Af Amer: >60 mL/min (ref >60)
Glucose, Bld: 231 mg/dL — ABNORMAL HIGH (ref 70–99)
Potassium: 4 mmol/L (ref 3.5–5.1)
Sodium: 132 mmol/L — ABNORMAL LOW (ref 135–145)
Total Bilirubin: 0.3 mg/dL (ref 0.3–1.2)
Total Protein: 6 g/dL — ABNORMAL LOW (ref 6.5–8.1)

## 2019-09-29 LAB — CBC WITH DIFFERENTIAL/PLATELET
Abs Immature Granulocytes: 0.1 10*3/uL — ABNORMAL HIGH (ref 0.00–0.07)
Basophils Absolute: 0 10*3/uL (ref 0.0–0.1)
Basophils Relative: 0 %
Eosinophils Absolute: 0 10*3/uL (ref 0.0–0.5)
Eosinophils Relative: 0 %
HCT: 37.3 % — ABNORMAL LOW (ref 39.0–52.0)
Hemoglobin: 12.4 g/dL — ABNORMAL LOW (ref 13.0–17.0)
Immature Granulocytes: 1 %
Lymphocytes Relative: 3 %
Lymphs Abs: 0.4 10*3/uL — ABNORMAL LOW (ref 0.7–4.0)
MCH: 29.5 pg (ref 26.0–34.0)
MCHC: 33.2 g/dL (ref 30.0–36.0)
MCV: 88.8 fL (ref 80.0–100.0)
Monocytes Absolute: 0.2 10*3/uL (ref 0.1–1.0)
Monocytes Relative: 2 %
Neutro Abs: 13.3 10*3/uL — ABNORMAL HIGH (ref 1.7–7.7)
Neutrophils Relative %: 94 %
Platelets: 255 10*3/uL (ref 150–400)
RBC: 4.2 MIL/uL — ABNORMAL LOW (ref 4.22–5.81)
RDW: 13.4 % (ref 11.5–15.5)
WBC: 14.1 10*3/uL — ABNORMAL HIGH (ref 4.0–10.5)
nRBC: 0 % (ref 0.0–0.2)

## 2019-09-29 LAB — D-DIMER, QUANTITATIVE: D-Dimer, Quant: 1.16 ug/mL-FEU — ABNORMAL HIGH (ref 0.00–0.50)

## 2019-09-29 LAB — C-REACTIVE PROTEIN: CRP: 4.6 mg/dL — ABNORMAL HIGH (ref <1.0)

## 2019-09-29 MED ORDER — INSULIN ASPART 100 UNIT/ML ~~LOC~~ SOLN
6.0000 [IU] | Freq: Three times a day (TID) | SUBCUTANEOUS | Status: DC
  Administered 2019-09-29 – 2019-10-01 (×6): 6 [IU] via SUBCUTANEOUS

## 2019-09-29 NOTE — Telephone Encounter (Signed)
Noted  

## 2019-09-29 NOTE — Telephone Encounter (Signed)
Patient is currently at the hospital   Nurse Assessment Nurse: Tollie Eth, RN, Dois Davenport Date/Time David Maynard Time): 09/26/2019 8:45:05 PM Confirm and document reason for call. If symptomatic, describe symptoms. ---Caller is wife and says her husband has covid and his resp rate is at 30 with shallow breathing. His O2 sat is 87-89 . Tested positive ion Tuesday and IV infusion on Thursday. Has the patient had close contact with a person known or suspected to have the novel coronavirus illness OR traveled / lives in area with major community spread (including international travel) in the last 14 days from the onset of symptoms? * If Asymptomatic, screen for exposure and travel within the last 14 days. ---Yes Does the patient have any new or worsening symptoms? ---Yes Will a triage be completed? ---Yes Related visit to physician within the last 2 weeks? ---No Does the PT have any chronic conditions? (i.e. diabetes, asthma, this includes High risk factors for pregnancy, etc.) ---Yes List chronic conditions. ---diabetes Is this a behavioral health or substance abuse call? ---No Guidelines Guideline Title Affirmed Question Affirmed Notes Nurse Date/Time (Eastern Time) COVID-19 - Diagnosed or Suspected SEVERE or constant chest pain or pressure (Exception: mild central chest pain, present only when coughing) Plummer, RN, Dois Davenport 09/26/2019 8:47:23 PMPLEASE NOTE: All timestamps contained within this report are represented as [place] Standard Time. CONFIDENTIALTY NOTICE: This fax transmission is intended only for the addressee. It contains information that is legally privileged, confidential or otherwise protected from use or disclosure. If you are not the intended recipient, you are strictly prohibited from reviewing, disclosing, copying using or disseminating any of this information or taking any action in reliance on or regarding this information. If you have received this fax in error, please notify  us immediately by telephone so that we can arrange for its return to [place]. Phone: [phone], Toll-Free: [phone], Fax: [phone] Page: 2 of 2 Call Id: [id number] Disp. Time David Maynard Time) Disposition Final User 09/26/2019 8:42:22 PM Send to Urgent Alena Bills 09/26/2019 8:49:47 PM Go to ED Now Yes Tollie Eth, RN, Clydene Laming Disagree/Comply Comply Caller Understands Yes PreDisposition Did not know what to do Care Advice Given Per Guideline GO TO ED NOW: CALL EMS 911 IF: * Severe difficulty breathing occurs * Lips or face turns blue * Confusion occurs. CARE ADVICE given per COVID-19 - DIAGNOSED OR SUSPECTED (Adult) guideline. ANOTHER ADULT SHOULD DRIVE: * It is better and safer if another adult drives instead of you. WEAR A MASK - COVER YOUR MOUTH AND NOSE: * Wear a mask. Referrals [facility] - ED

## 2019-09-29 NOTE — Progress Notes (Signed)
PROGRESS NOTE  David Maynard KKX:[id number] DOB: Feb 26, 1950   PCP: Ardith Dark, MD  Patient is from: Home.  DOA: 09/26/2019 LOS: 2  Brief Narrative / Interim history: 69 year old male with history of NIDDM-2, hypothyroidism, BPH, hyperlipidemia and bilateral knee OA status post TKA presenting with hypoxemia to 80s at home.  Had symptoms for 10 days.  Tested positive for COVID-19 on 8/31.  Received monoclonal antibody infusion on 9/2.  In ED, COVID-19 PCR positive.  Tachypneic to 30s.  Saturation 88% on RA.  Pro-Cal negative.  Inflammatory markers elevated.  CXR with patchy bilateral airspace disease compatible COVID-19 pneumonia.  CTA chest negative for PE but extensive bilateral groundglass opacities.  Patient was started on aspirin, Decadron and remdesivir and admitted.  Patient is on IV Solu-Medrol and remdesivir.  Subjective: Seen and examined earlier this morning.  No major events overnight of this morning.  Reportedly desaturated to 85% on 2 L.  Currently saturating in upper 80s to lower 90s on 4 L.  No complaints.  He denies shortness of breath, significant cough or chest pain.  Denies GI or UTI symptoms.  He is very anxious about his increased oxygen requirement asks to wean him off.   Objective: Vitals:   09/28/19 2105 09/29/19 0108 09/29/19 0506 09/29/19 1317  BP: 128/76 126/65 124/67 121/88  Pulse: 87 86 72 100  Resp: 18 18 18 20   Temp: 97.9 F (36.6 C) 97.7 F (36.5 C) 98 F (36.7 C) 98.1 F (36.7 C)  TempSrc: Oral Oral Oral   SpO2: 93% 95% 93% 91%  Weight:      Height:        Intake/Output Summary (Last 24 hours) at 09/29/2019 1408 Last data filed at 09/29/2019 1400 Gross per 24 hour  Intake 937 ml  Output 150 ml  Net 787 ml   Filed Weights   09/26/19 2120  Weight: 61.2 kg    Examination:  GENERAL: No apparent distress.  Nontoxic. HEENT: MMM.  Vision and hearing grossly intact.  NECK: Supple.  No apparent JVD.  RESP: 90% on 4 L no IWOB.  Fair  aeration.  Crackles bilaterally CVS:  RRR. Heart sounds normal.  ABD/GI/GU: BS+. Abd soft, NTND.  MSK/EXT:  Moves extremities. No apparent deformity. No edema.  SKIN: no apparent skin lesion or wound NEURO: Awake, alert and oriented appropriately.  No apparent focal neuro deficit. PSYCH: Somewhat anxious.  Procedures:  None  Microbiology summarized: COVID-19 PCR positive.  Assessment & Plan: Acute respiratory failure with hypoxia due to COVID-19 pneumonia: Symptomatic for about 10 days prior to arrival.  Tested positive on 8/31.  Monoclonal antibody infusion on 9/2.  Hypoxemic to 80s at home, and 88% in ED. CTA negative for PE but bilateral GG. Pro-Cal negative. Inflammatory markers elevated but downtrending.  Requiring 2 L to maintain saturation in low 90s. Recent Labs    09/26/19 2234 09/28/19 0656 09/29/19 0429  DDIMER 1.35* 1.11* 1.16*  FERRITIN 406*  --   --   LDH 333*  --   --   CRP 9.2* 6.1* 4.6*  -Continue IV Solu-Medrol and remdesivir 9/5>>.  No indication for baricitinib at this time -P.o. Protonix for GI prophylaxis -Supportive care with supplemental oxygen, inhalers, mucolytic's, antitussives, vitamins, incentive spirometry -Monitor inflammatory markers -OOB/PT  Controlled NIDDM-2 with hyperglycemia: hyperglycemia likely due to steroid.  A1c 6.1%. Recent Labs  Lab 09/28/19 1320 09/28/19 1700 09/28/19 2109 09/29/19 0755 09/29/19 1131  GLUCAP 168* 182* 180* 207* 113*  -Continue SSI-moderate -  Increase NovoLog from 4 to 6 units AC -Continue Tradjenta -Continue home statin.  Mild hyponatremia: Na 132.  Stable. -Continue monitoring  Hypothyroidism -Continue home Synthroid  Hypogonadism -Continue home clomiphene and bromocriptine  Hyperlipidemia -Continue home WelChol  Osteoarthritis -As needed Tylenol  Overactive bladder -Continue home Myrbetriq  Leukocytosis/bandemia: Likely demargination from steroid.  Improving.   Body mass index is 20.53  kg/m.         DVT prophylaxis:  enoxaparin (LOVENOX) injection 40 mg Start: 09/27/19 1000  Code Status: Full code Family Communication: Updated patient's wife over the phone. Status is: Inpatient.   Remains inpatient appropriate because:IV treatments appropriate due to intensity of illness or inability to take PO and Inpatient level of care appropriate due to severity of illness   Dispo: The patient is from: Home              Anticipated d/c is to: Home              Anticipated d/c date is: 2 days              Patient currently is not medically stable to d/c.    Consultants:  None   Sch Meds:  Scheduled Meds: . vitamin C  500 mg Oral Daily  . bromocriptine  2.5 mg Oral Daily  . cholecalciferol  1,000 Units Oral Daily  . clomiPHENE  25 mg Oral Daily  . colesevelam  2,500 mg Oral Daily  . enoxaparin (LOVENOX) injection  40 mg Subcutaneous Q24H  . ferrous sulfate  325 mg Oral BID PC  . insulin aspart  0-15 Units Subcutaneous TID WC  . insulin aspart  0-5 Units Subcutaneous QHS  . insulin aspart  6 Units Subcutaneous TID WC  . levothyroxine  75 mcg Oral Daily  . linagliptin  5 mg Oral Daily  . methylPREDNISolone (SOLU-MEDROL) injection  0.5 mg/kg Intravenous Q12H   Followed by  . [START ON 09/30/2019] predniSONE  50 mg Oral Daily  . mirabegron ER  50 mg Oral Daily  . omega-3 acid ethyl esters  2 capsule Oral BID  . simvastatin  10 mg Oral QHS  . zinc sulfate  220 mg Oral Daily   Continuous Infusions: . remdesivir 100 mg in NS 100 mL 100 mg (09/29/19 0908)   PRN Meds:.acetaminophen, albuterol, chlorpheniramine-HYDROcodone, guaiFENesin-dextromethorphan  Antimicrobials: Anti-infectives (From admission, onward)   Start     Dose/Rate Route Frequency Ordered Stop   09/28/19 1000  remdesivir 100 mg in sodium chloride 0.9 % 100 mL IVPB       "Followed by" Linked Group Details   100 mg 200 mL/hr over 30 Minutes Intravenous Daily 09/27/19 0049 10/02/19 0959   09/27/19  0100  remdesivir 200 mg in sodium chloride 0.9% 250 mL IVPB       "Followed by" Linked Group Details   200 mg 580 mL/hr over 30 Minutes Intravenous Once 09/27/19 0049 09/27/19 0200       I have personally reviewed the following labs and images: CBC: Recent Labs  Lab 09/26/19 2234 09/28/19 0656 09/29/19 0429  WBC 8.2 15.6* 14.1*  NEUTROABS 6.9 14.2* 13.3*  HGB 13.4 13.3 12.4*  HCT 41.3 39.3 37.3*  MCV 90.0 89.3 88.8  PLT 196 227 255   BMP &GFR Recent Labs  Lab 09/26/19 2234 09/28/19 0656 09/29/19 0429  NA 132* 134* 132*  K 3.9 4.4 4.0  CL 99 101 102  CO2 23 23 22   GLUCOSE 84 192* 231*  BUN 17  24* 27*  CREATININE 1.03 1.02 0.87  CALCIUM 8.5* 8.7* 8.3*   Estimated Creatinine Clearance: 69.4 mL/min (by C-G formula based on SCr of 0.87 mg/dL). Liver & Pancreas: Recent Labs  Lab 09/26/19 2234 09/28/19 0656 09/29/19 0429  AST 37 26 25  ALT 21 19 19   ALKPHOS 44 43 43  BILITOT 0.6 0.2* 0.3  PROT 6.5 6.2* 6.0*  ALBUMIN 3.4* 3.0* 2.8*   No results for input(s): LIPASE, AMYLASE in the last 168 hours. No results for input(s): AMMONIA in the last 168 hours. Diabetic: Recent Labs    09/27/19 0430  HGBA1C 6.1*   Recent Labs  Lab 09/28/19 1320 09/28/19 1700 09/28/19 2109 09/29/19 0755 09/29/19 1131  GLUCAP 168* 182* 180* 207* 113*   Cardiac Enzymes: No results for input(s): CKTOTAL, CKMB, CKMBINDEX, TROPONINI in the last 168 hours. No results for input(s): PROBNP in the last 8760 hours. Coagulation Profile: No results for input(s): INR, PROTIME in the last 168 hours. Thyroid Function Tests: No results for input(s): TSH, T4TOTAL, FREET4, T3FREE, THYROIDAB in the last 72 hours. Lipid Profile: Recent Labs    09/26/19 2234  TRIG 136   Anemia Panel: Recent Labs    09/26/19 2234  FERRITIN 406*   Urine analysis:    Component Value Date/Time   COLORURINE YELLOW 12/03/2016 1435   APPEARANCEUR CLEAR 12/03/2016 1435   LABSPEC 1.020 12/03/2016 1435    PHURINE 5.5 12/03/2016 1435   GLUCOSEU NEGATIVE 12/03/2016 1435   HGBUR NEGATIVE 12/03/2016 1435   BILIRUBINUR NEGATIVE 04/28/2018 0947   KETONESUR NEGATIVE 12/03/2016 1435   PROTEINUR Negative 04/28/2018 0947   UROBILINOGEN 0.2 04/28/2018 0947   UROBILINOGEN 0.2 12/03/2016 1435   NITRITE NEGATIVE 04/28/2018 0947   NITRITE NEGATIVE 12/03/2016 1435   LEUKOCYTESUR Negative 04/28/2018 0947   Sepsis Labs: Invalid input(s): PROCALCITONIN, LACTICIDVEN  Microbiology: Recent Results (from the past 240 hour(s))  SARS Coronavirus 2 by RT PCR (hospital order, performed in [facility] hospital lab) Nasopharyngeal Nasopharyngeal Swab     Status: Abnormal   Collection Time: 09/27/19 12:20 AM   Specimen: Nasopharyngeal Swab  Result Value Ref Range Status   SARS Coronavirus 2 POSITIVE (A) NEGATIVE Final    Comment: RESULT CALLED TO, READ BACK BY AND VERIFIED WITH: J,TALKINGTON AT 0705 ON 09/27/19 BY A,MOHAMED (NOTE) SARS-CoV-2 target nucleic acids are DETECTED  SARS-CoV-2 RNA is generally detectable in upper respiratory specimens  during the acute phase of infection.  Positive results are indicative  of the presence of the identified virus, but do not rule out bacterial infection or co-infection with other pathogens not detected by the test.  Clinical correlation with patient history and  other diagnostic information is necessary to determine patient infection status.  The expected result is negative.  Fact Sheet for Patients:   11/27/19   Fact Sheet for Healthcare Providers:   BoilerBrush.com.cy    This test is not yet approved or cleared by the https://pope.com/ FDA and  has been authorized for detection and/or diagnosis of SARS-CoV-2 by FDA under an Emergency Use Authorization (EUA).  This EUA will remain in effect (meanin g this test can be used) for the duration of  the COVID-19 declaration under Section 564(b)(1) of the Act,  21 U.S.C. section 360-bbb-3(b)(1), unless the authorization is terminated or revoked sooner.  Performed at [facility], [address], [place], [place]     Radiology Studies: No results found. No charge service   Kasheena Sambrano T. [facility]  If 7PM-7AM, please contact night-coverage [email or web address] 09/29/2019, 2:08 PM

## 2019-09-30 ENCOUNTER — Ambulatory Visit (HOSPITAL_COMMUNITY): Payer: Medicare Other

## 2019-09-30 LAB — CBC WITH DIFFERENTIAL/PLATELET
Abs Immature Granulocytes: 0.14 10*3/uL — ABNORMAL HIGH (ref 0.00–0.07)
Basophils Absolute: 0 10*3/uL (ref 0.0–0.1)
Basophils Relative: 0 %
Eosinophils Absolute: 0 10*3/uL (ref 0.0–0.5)
Eosinophils Relative: 0 %
HCT: 38.5 % — ABNORMAL LOW (ref 39.0–52.0)
Hemoglobin: 12.7 g/dL — ABNORMAL LOW (ref 13.0–17.0)
Immature Granulocytes: 1 %
Lymphocytes Relative: 4 %
Lymphs Abs: 0.5 10*3/uL — ABNORMAL LOW (ref 0.7–4.0)
MCH: 30 pg (ref 26.0–34.0)
MCHC: 33 g/dL (ref 30.0–36.0)
MCV: 91 fL (ref 80.0–100.0)
Monocytes Absolute: 0.3 10*3/uL (ref 0.1–1.0)
Monocytes Relative: 2 %
Neutro Abs: 11.9 10*3/uL — ABNORMAL HIGH (ref 1.7–7.7)
Neutrophils Relative %: 93 %
Platelets: 280 10*3/uL (ref 150–400)
RBC: 4.23 MIL/uL (ref 4.22–5.81)
RDW: 13.6 % (ref 11.5–15.5)
WBC: 12.8 10*3/uL — ABNORMAL HIGH (ref 4.0–10.5)
nRBC: 0 % (ref 0.0–0.2)

## 2019-09-30 LAB — GLUCOSE, CAPILLARY
Glucose-Capillary: 216 mg/dL — ABNORMAL HIGH (ref 70–99)
Glucose-Capillary: 166 mg/dL — ABNORMAL HIGH (ref 70–99)
Glucose-Capillary: 203 mg/dL — ABNORMAL HIGH (ref 70–99)

## 2019-09-30 LAB — COMPREHENSIVE METABOLIC PANEL
ALT: 21 U/L (ref 0–44)
AST: 25 U/L (ref 15–41)
Albumin: 3 g/dL — ABNORMAL LOW (ref 3.5–5.0)
Alkaline Phosphatase: 46 U/L (ref 38–126)
Anion gap: 11 (ref 5–15)
BUN: 22 mg/dL (ref 8–23)
CO2: 22 mmol/L (ref 22–32)
Calcium: 8.5 mg/dL — ABNORMAL LOW (ref 8.9–10.3)
Chloride: 101 mmol/L (ref 98–111)
Creatinine, Ser: 0.9 mg/dL (ref 0.61–1.24)
GFR calc Af Amer: >60 mL/min (ref >60)
GFR calc non Af Amer: >60 mL/min (ref >60)
Glucose, Bld: 217 mg/dL — ABNORMAL HIGH (ref 70–99)
Potassium: 4.4 mmol/L (ref 3.5–5.1)
Sodium: 134 mmol/L — ABNORMAL LOW (ref 135–145)
Total Bilirubin: 0.5 mg/dL (ref 0.3–1.2)
Total Protein: 5.8 g/dL — ABNORMAL LOW (ref 6.5–8.1)

## 2019-09-30 LAB — C-REACTIVE PROTEIN: CRP: 6.1 mg/dL — ABNORMAL HIGH (ref <1.0)

## 2019-09-30 LAB — D-DIMER, QUANTITATIVE: D-Dimer, Quant: 1.16 ug/mL-FEU — ABNORMAL HIGH (ref 0.00–0.50)

## 2019-09-30 NOTE — Progress Notes (Signed)
PROGRESS NOTE  David Maynard:[id number] DOB: 1950/11/18   PCP: Ardith Dark, MD  Patient is from: Home.  DOA: 09/26/2019 LOS: 3  Brief Narrative / Interim history: 69 year old male with history of NIDDM-2, hypothyroidism, BPH, hyperlipidemia and bilateral knee OA status post TKA presenting with hypoxemia to 80s at home.  Had symptoms for 10 days.  Tested positive for COVID-19 on 8/31.  Received monoclonal antibody infusion on 9/2.  In ED, COVID-19 PCR positive.  Tachypneic to 30s.  Saturation 88% on RA.  Pro-Cal negative.  Inflammatory markers elevated.  CXR with patchy bilateral airspace disease compatible COVID-19 pneumonia.  CTA chest negative for PE but extensive bilateral groundglass opacities.  Patient was started on aspirin, Decadron and remdesivir and admitted.  Patient is on IV Solu-Medrol and remdesivir.  Slowly improving.  Subjective: Seen and examined earlier this morning.  No major events overnight or this morning.  No complaints.  He denies shortness of breath, chest pain, GI or UTI symptoms.  Reports intermittent cough.  He says he desaturated to low 80s when he walked in the room.  Currently saturating 89 to 98% on 2 L.  Objective: Vitals:   09/29/19 0506 09/29/19 1317 09/29/19 2015 09/30/19 0413  BP: 124/67 121/88 140/82 135/83  Pulse: 72 100 (!) 108 89  Resp: 18 20 18 18   Temp: 98 F (36.7 C) 98.1 F (36.7 C) 98.5 F (36.9 C) 98 F (36.7 C)  TempSrc: Oral Oral Oral Oral  SpO2: 93% 91% 94% 92%  Weight:      Height:        Intake/Output Summary (Last 24 hours) at 09/30/2019 1320 Last data filed at 09/29/2019 1841 Gross per 24 hour  Intake 592 ml  Output 600 ml  Net -8 ml   Filed Weights   09/26/19 2120  Weight: 61.2 kg    Examination:  GENERAL: No apparent distress.  Nontoxic. HEENT: MMM.  Vision and hearing grossly intact.  NECK: Supple.  No apparent JVD.  RESP: 89 to 98% on 2 L.  No IWOB.  Crackles bilaterally. CVS:  RRR. Heart sounds normal.   ABD/GI/GU: BS+. Abd soft, NTND.  MSK/EXT:  Moves extremities. No apparent deformity. No edema.  SKIN: no apparent skin lesion or wound NEURO: Awake, alert and oriented appropriately.  No apparent focal neuro deficit. PSYCH: Calm. Normal affect.   Procedures:  None  Microbiology summarized: COVID-19 PCR positive.  Assessment & Plan: Acute respiratory failure with hypoxia due to COVID-19 pneumonia: Symptomatic for about 10 days prior to arrival.  Tested positive on 8/31.  Monoclonal antibody infusion on 9/2.  Hypoxemic to 80s at home, and 88% in ED. CTA negative for PE but bilateral GG. Pro-Cal negative. Inflammatory markers elevated but downtrending.  Continues to require 2 L. Recent Labs    09/28/19 0656 09/29/19 0429 09/30/19 0414  DDIMER 1.11* 1.16* 1.16*  CRP 6.1* 4.6* 6.1*  -Continue IV Solu-Medrol and remdesivir 9/5>>.  No indication for baricitinib at this time -P.o. Protonix for GI prophylaxis -Supportive care with supplemental oxygen, inhalers, mucolytic's, antitussives, vitamins, incentive spirometry -Monitor inflammatory markers -OOB/PT -Wean oxygen, ambulate for saturation assessment  Controlled NIDDM-2 with hyperglycemia: hyperglycemia likely due to steroid.  A1c 6.1%. Recent Labs  Lab 09/29/19 1131 09/29/19 1548 09/29/19 2018 09/30/19 0739 09/30/19 1144  GLUCAP 113* 221* 113* 216* 166*  -Continue SSI-moderate -Continue NovoLog 6 units AC -Continue Tradjenta -Continue home statin.  Mild hyponatremia: Na 134.  Stable. -Continue monitoring  Hypothyroidism -Continue home Synthroid  Hypogonadism -Continue home clomiphene and bromocriptine  Hyperlipidemia -Continue home WelChol  Osteoarthritis -As needed Tylenol  Overactive bladder -Continue home Myrbetriq  Leukocytosis/bandemia: Likely demargination from steroid.  Improving.   Body mass index is 20.53 kg/m.         DVT prophylaxis:  enoxaparin (LOVENOX) injection 40 mg Start: 09/27/19  1000  Code Status: Full code Family Communication: Updated patient's wife over the phone. Status is: Inpatient.   Remains inpatient appropriate because:IV treatments appropriate due to intensity of illness or inability to take PO and Inpatient level of care appropriate due to severity of illness   Dispo: The patient is from: Home              Anticipated d/c is to: Home              Anticipated d/c date is: 1 day              Patient currently is not medically stable to d/c.    Consultants:  None   Sch Meds:  Scheduled Meds: . vitamin C  500 mg Oral Daily  . bromocriptine  2.5 mg Oral Daily  . cholecalciferol  1,000 Units Oral Daily  . clomiPHENE  25 mg Oral Daily  . colesevelam  2,500 mg Oral Daily  . enoxaparin (LOVENOX) injection  40 mg Subcutaneous Q24H  . ferrous sulfate  325 mg Oral BID PC  . insulin aspart  0-15 Units Subcutaneous TID WC  . insulin aspart  0-5 Units Subcutaneous QHS  . insulin aspart  6 Units Subcutaneous TID WC  . levothyroxine  75 mcg Oral Daily  . linagliptin  5 mg Oral Daily  . mirabegron ER  50 mg Oral Daily  . omega-3 acid ethyl esters  2 capsule Oral BID  . predniSONE  50 mg Oral Daily  . simvastatin  10 mg Oral QHS  . zinc sulfate  220 mg Oral Daily   Continuous Infusions: . remdesivir 100 mg in NS 100 mL 100 mg (09/30/19 0940)   PRN Meds:.acetaminophen, albuterol, chlorpheniramine-HYDROcodone, guaiFENesin-dextromethorphan  Antimicrobials: Anti-infectives (From admission, onward)   Start     Dose/Rate Route Frequency Ordered Stop   09/28/19 1000  remdesivir 100 mg in sodium chloride 0.9 % 100 mL IVPB       "Followed by" Linked Group Details   100 mg 200 mL/hr over 30 Minutes Intravenous Daily 09/27/19 0049 10/02/19 0959   09/27/19 0100  remdesivir 200 mg in sodium chloride 0.9% 250 mL IVPB       "Followed by" Linked Group Details   200 mg 580 mL/hr over 30 Minutes Intravenous Once 09/27/19 0049 09/27/19 0200       I have  personally reviewed the following labs and images: CBC: Recent Labs  Lab 09/26/19 2234 09/28/19 0656 09/29/19 0429 09/30/19 0414  WBC 8.2 15.6* 14.1* 12.8*  NEUTROABS 6.9 14.2* 13.3* 11.9*  HGB 13.4 13.3 12.4* 12.7*  HCT 41.3 39.3 37.3* 38.5*  MCV 90.0 89.3 88.8 91.0  PLT 196 227 255 280   BMP &GFR Recent Labs  Lab 09/26/19 2234 09/28/19 0656 09/29/19 0429 09/30/19 0414  NA 132* 134* 132* 134*  K 3.9 4.4 4.0 4.4  CL 99 101 102 101  CO2 23 23 22 22   GLUCOSE 84 192* 231* 217*  BUN 17 24* 27* 22  CREATININE 1.03 1.02 0.87 0.90  CALCIUM 8.5* 8.7* 8.3* 8.5*   Estimated Creatinine Clearance: 67.1 mL/min (by C-G formula based on SCr of 0.9  mg/dL). Liver & Pancreas: Recent Labs  Lab 09/26/19 2234 09/28/19 0656 09/29/19 0429 09/30/19 0414  AST 37 26 25 25   ALT 21 19 19 21   ALKPHOS 44 43 43 46  BILITOT 0.6 0.2* 0.3 0.5  PROT 6.5 6.2* 6.0* 5.8*  ALBUMIN 3.4* 3.0* 2.8* 3.0*   No results for input(s): LIPASE, AMYLASE in the last 168 hours. No results for input(s): AMMONIA in the last 168 hours. Diabetic: No results for input(s): HGBA1C in the last 72 hours. Recent Labs  Lab 09/29/19 1131 09/29/19 1548 09/29/19 2018 09/30/19 0739 09/30/19 1144  GLUCAP 113* 221* 113* 216* 166*   Cardiac Enzymes: No results for input(s): CKTOTAL, CKMB, CKMBINDEX, TROPONINI in the last 168 hours. No results for input(s): PROBNP in the last 8760 hours. Coagulation Profile: No results for input(s): INR, PROTIME in the last 168 hours. Thyroid Function Tests: No results for input(s): TSH, T4TOTAL, FREET4, T3FREE, THYROIDAB in the last 72 hours. Lipid Profile: No results for input(s): CHOL, HDL, LDLCALC, TRIG, CHOLHDL, LDLDIRECT in the last 72 hours. Anemia Panel: No results for input(s): VITAMINB12, FOLATE, FERRITIN, TIBC, IRON, RETICCTPCT in the last 72 hours. Urine analysis:    Component Value Date/Time   COLORURINE YELLOW 12/03/2016 1435   APPEARANCEUR CLEAR 12/03/2016 1435    LABSPEC 1.020 12/03/2016 1435   PHURINE 5.5 12/03/2016 1435   GLUCOSEU NEGATIVE 12/03/2016 1435   HGBUR NEGATIVE 12/03/2016 1435   BILIRUBINUR NEGATIVE 04/28/2018 0947   KETONESUR NEGATIVE 12/03/2016 1435   PROTEINUR Negative 04/28/2018 0947   UROBILINOGEN 0.2 04/28/2018 0947   UROBILINOGEN 0.2 12/03/2016 1435   NITRITE NEGATIVE 04/28/2018 0947   NITRITE NEGATIVE 12/03/2016 1435   LEUKOCYTESUR Negative 04/28/2018 0947   Sepsis Labs: Invalid input(s): PROCALCITONIN, LACTICIDVEN  Microbiology: Recent Results (from the past 240 hour(s))  SARS Coronavirus 2 by RT PCR (hospital order, performed in [facility] hospital lab) Nasopharyngeal Nasopharyngeal Swab     Status: Abnormal   Collection Time: 09/27/19 12:20 AM   Specimen: Nasopharyngeal Swab  Result Value Ref Range Status   SARS Coronavirus 2 POSITIVE (A) NEGATIVE Final    Comment: RESULT CALLED TO, READ BACK BY AND VERIFIED WITH: J,TALKINGTON AT 0705 ON 09/27/19 BY A,MOHAMED (NOTE) SARS-CoV-2 target nucleic acids are DETECTED  SARS-CoV-2 RNA is generally detectable in upper respiratory specimens  during the acute phase of infection.  Positive results are indicative  of the presence of the identified virus, but do not rule out bacterial infection or co-infection with other pathogens not detected by the test.  Clinical correlation with patient history and  other diagnostic information is necessary to determine patient infection status.  The expected result is negative.  Fact Sheet for Patients:   0706   Fact Sheet for Healthcare Providers:   11/27/19    This test is not yet approved or cleared by the [employer] FDA and  has been authorized for detection and/or diagnosis of SARS-CoV-2 by FDA under an Emergency Use Authorization (EUA).  This EUA will remain in effect (meanin g this test can be used) for the duration of  the COVID-19 declaration under  Section 564(b)(1) of the Act, 21 U.S.C. section 360-bbb-3(b)(1), unless the authorization is terminated or revoked sooner.  Performed at [facility], [address] [place], [place]     Radiology Studies: No results found. No charge service   Dmarcus Decicco T. Casara Perrier [facility]  If 7PM-7AM, please contact night-coverage [email or web address] 09/30/2019, 1:20 PM

## 2019-09-30 NOTE — Progress Notes (Signed)
Physical Therapy Treatment Patient Details Name: David Maynard MRN: [id number] DOB: 01-13-1951 Today's Date: 09/30/2019    History of Present Illness 69 yo male admitted with Acute respiratory failure with hypoxia due to COVID-19 pneumonia: Symptomatic for about 10 days prior to arrival.  Tested positive on 8/31.  Monoclonal antibody infusion on 9/2.  Hypoxemic to 80s at home, and 88% in ED. inflammatory markers elevated.  CTA negative for PE but bilateral GG.  Still tachypneic to upper 20s.  Desaturated to 88% with ambulation.    PT Comments    Progressing well. O2 85% on 2L with activity/ambulation.   Follow Up Recommendations  No PT follow up     Equipment Recommendations  None recommended by PT    Recommendations for Other Services       Precautions / Restrictions Precautions Precaution Comments: monitor  O2 Restrictions Weight Bearing Restrictions: No    Mobility  Bed Mobility               General bed mobility comments: oob in recliner  Transfers Overall transfer level: Needs assistance   Transfers: Sit to/from Stand Sit to Stand: Supervision            Ambulation/Gait Ambulation/Gait assistance: Min guard Gait Distance (Feet): 180 Feet Assistive device: None Gait Pattern/deviations: Step-through pattern;Decreased stride length     General Gait Details: multiple laps around room. dyspnea 2/4, 85% on 2L   Stairs             Wheelchair Mobility    Modified Rankin (Stroke Patients Only)       Balance                                            Cognition Arousal/Alertness: Awake/alert Behavior During Therapy: WFL for tasks assessed/performed Overall Cognitive Status: Within Functional Limits for tasks assessed                                        Exercises Other Exercises Other Exercises: Sit to Stand 10 reps in a row    General Comments        Pertinent Vitals/Pain Pain Assessment:  No/denies pain    Home Living                      Prior Function            PT Goals (current goals can now be found in the care plan section) Progress towards PT goals: Progressing toward goals    Frequency    Min 3X/week      PT Plan Current plan remains appropriate    Co-evaluation              AM-PAC PT "6 Clicks" Mobility   Outcome Measure  Help needed turning from your back to your side while in a flat bed without using bedrails?: None Help needed moving from lying on your back to sitting on the side of a flat bed without using bedrails?: None Help needed moving to and from a bed to a chair (including a wheelchair)?: A Little Help needed standing up from a chair using your arms (e.g., wheelchair or bedside chair)?: A Little Help needed to walk in hospital room?: A Little Help needed climbing 3-5  steps with a railing? : A Little 6 Click Score: 20    End of Session Equipment Utilized During Treatment: Oxygen Activity Tolerance: Patient tolerated treatment well Patient left: in chair;with call bell/phone within reach   PT Visit Diagnosis: Unsteadiness on feet (R26.81)     Time: 9767-3419 PT Time Calculation (min) (ACUTE ONLY): 26 min  Charges:  $Gait Training: 8-22 mins $Therapeutic Exercise: 8-22 mins                         Faye Ramsay, PT Acute Rehabilitation  Office: [phone] Pager: [phone]

## 2019-10-01 ENCOUNTER — Ambulatory Visit (HOSPITAL_COMMUNITY): Payer: Medicare Other

## 2019-10-01 ENCOUNTER — Encounter: Payer: Self-pay | Admitting: Family Medicine

## 2019-10-01 DIAGNOSIS — E291 Testicular hypofunction: Secondary | ICD-10-CM

## 2019-10-01 LAB — COMPREHENSIVE METABOLIC PANEL
ALT: 26 U/L (ref 0–44)
AST: 27 U/L (ref 15–41)
Albumin: 3.3 g/dL — ABNORMAL LOW (ref 3.5–5.0)
Alkaline Phosphatase: 50 U/L (ref 38–126)
Anion gap: 11 (ref 5–15)
BUN: 30 mg/dL — ABNORMAL HIGH (ref 8–23)
CO2: 21 mmol/L — ABNORMAL LOW (ref 22–32)
Calcium: 8.8 mg/dL — ABNORMAL LOW (ref 8.9–10.3)
Chloride: 99 mmol/L (ref 98–111)
Creatinine, Ser: 0.95 mg/dL (ref 0.61–1.24)
GFR calc Af Amer: >60 mL/min (ref >60)
GFR calc non Af Amer: >60 mL/min (ref >60)
Glucose, Bld: 265 mg/dL — ABNORMAL HIGH (ref 70–99)
Potassium: 3.8 mmol/L (ref 3.5–5.1)
Sodium: 131 mmol/L — ABNORMAL LOW (ref 135–145)
Total Bilirubin: 0.6 mg/dL (ref 0.3–1.2)
Total Protein: 6.4 g/dL — ABNORMAL LOW (ref 6.5–8.1)

## 2019-10-01 LAB — CBC WITH DIFFERENTIAL/PLATELET
Abs Immature Granulocytes: 0.16 10*3/uL — ABNORMAL HIGH (ref 0.00–0.07)
Basophils Absolute: 0 10*3/uL (ref 0.0–0.1)
Basophils Relative: 0 %
Eosinophils Absolute: 0.1 10*3/uL (ref 0.0–0.5)
Eosinophils Relative: 1 %
HCT: 40.2 % (ref 39.0–52.0)
Hemoglobin: 13.4 g/dL (ref 13.0–17.0)
Immature Granulocytes: 1 %
Lymphocytes Relative: 5 %
Lymphs Abs: 0.6 10*3/uL — ABNORMAL LOW (ref 0.7–4.0)
MCH: 30 pg (ref 26.0–34.0)
MCHC: 33.3 g/dL (ref 30.0–36.0)
MCV: 90.1 fL (ref 80.0–100.0)
Monocytes Absolute: 0.4 10*3/uL (ref 0.1–1.0)
Monocytes Relative: 3 %
Neutro Abs: 11.2 10*3/uL — ABNORMAL HIGH (ref 1.7–7.7)
Neutrophils Relative %: 90 %
Platelets: 309 10*3/uL (ref 150–400)
RBC: 4.46 MIL/uL (ref 4.22–5.81)
RDW: 13.6 % (ref 11.5–15.5)
WBC: 12.3 10*3/uL — ABNORMAL HIGH (ref 4.0–10.5)
nRBC: 0 % (ref 0.0–0.2)

## 2019-10-01 LAB — PHOSPHORUS: Phosphorus: 2.7 mg/dL (ref 2.5–4.6)

## 2019-10-01 LAB — GLUCOSE, CAPILLARY
Glucose-Capillary: 184 mg/dL — ABNORMAL HIGH (ref 70–99)
Glucose-Capillary: 244 mg/dL — ABNORMAL HIGH (ref 70–99)

## 2019-10-01 LAB — D-DIMER, QUANTITATIVE: D-Dimer, Quant: 1.31 ug/mL-FEU — ABNORMAL HIGH (ref 0.00–0.50)

## 2019-10-01 LAB — MAGNESIUM: Magnesium: 2.4 mg/dL (ref 1.7–2.4)

## 2019-10-01 LAB — C-REACTIVE PROTEIN: CRP: 8.6 mg/dL — ABNORMAL HIGH (ref <1.0)

## 2019-10-01 MED ORDER — PREDNISONE 10 MG (21) PO TBPK: ORAL_TABLET | ORAL | 0 refills | Status: DC

## 2019-10-01 MED ORDER — INSULIN LISPRO 100 UNIT/ML ~~LOC~~ SOLN: SUBCUTANEOUS | 0 refills | Status: DC

## 2019-10-01 NOTE — Plan of Care (Signed)
  Problem: Acute Rehab PT Goals(only PT should resolve) Goal: Patient Will Transfer Sit To/From Stand Outcome: Adequate for Discharge Goal: Pt Will Ambulate Outcome: Adequate for Discharge Goal: Pt/caregiver will Perform Home Exercise Program Outcome: Adequate for Discharge   Problem: Education: Goal: Knowledge of risk factors and measures for prevention of condition will improve Outcome: Adequate for Discharge   Problem: Coping: Goal: Psychosocial and spiritual needs will be supported Outcome: Adequate for Discharge   Problem: Respiratory: Goal: Will maintain a patent airway Outcome: Adequate for Discharge Goal: Complications related to the disease process, condition or treatment will be avoided or minimized Outcome: Adequate for Discharge

## 2019-10-01 NOTE — Discharge Summary (Signed)
Physician Discharge Summary  David Maynard ZOX:[id number] DOB: 1950/03/14 DOA: 09/26/2019  PCP: Ardith Dark, MD  Admit date: 09/26/2019 Discharge date: 10/01/2019  Admitted From: Home Disposition: Home  Recommendations for Outpatient Follow-up:  1. Follow ups as below. 2. Please obtain CBC/BMP/Mag at follow up 3. Please follow up on the following pending results: None  Home Health: None required Equipment/Devices: Home oxygen  Discharge Condition: Stable CODE STATUS: Full code   Follow-up Information    Ardith Dark, MD. Schedule an appointment as soon as possible for a visit in 2 week(s).   Specialty: Family Medicine Contact information: [address] [place] [address] [phone]               Hospital Course: 69 year old male with history of NIDDM-2, hypothyroidism, BPH, hyperlipidemia and bilateral knee OA status post TKA presenting with hypoxemia to 80s at home.  Had symptoms for 10 days.  Tested positive for COVID-19 on 8/31.  Received monoclonal antibody infusion on 9/2.  In ED, COVID-19 PCR positive.  Tachypneic to 30s.  Saturation 88% on RA.  Pro-Cal negative.  Inflammatory markers elevated.  CXR with patchy bilateral airspace disease compatible COVID-19 pneumonia.  CTA chest negative for PE but extensive bilateral groundglass opacities.  Patient was started on aspirin, Decadron and remdesivir and admitted.  On admission, Decadron changed to Solu-Medrol. Continued on remdesivir and completed the course on the day of discharge. Inflammatory markers initially improved but uptrended later although he felt better and improved clinically. Patient was ambulated on room air and desaturated to 83% but quickly recovered to 91% just with 1 L of oxygen.   Patient discharged on Sterapred and sliding scale insulin while on steroid. He was counseled on return precautions and isolation precautions. Family has been updated throughout his hospitalization.  Discharge  Diagnoses:  Acute respiratory failure with hypoxia due to COVID-19 pneumonia: Symptomatic for about 10 days prior to arrival.  Tested positive on 8/31.  Monoclonal antibody infusion on 9/2.  Hypoxemic to 80s at home, and 88% in ED. CTA negative for PE but bilateral GG. Pro-Cal negative. Inflammatory markers have uptrended after initial improvement although he has improved clinically. He completed remdesivir infusion for 5 days. Discharged on Sterapred and 2 L oxygen. He was encouraged to continue using incentive spirometry. Also encourage mobilization given risk for VTE with Covid infections. Prescribed inhaler, mucolytic's, antitussive and vitamins as well. Return and isolation precautions discussed in detail.  Controlled NIDDM-2 with hyperglycemia: hyperglycemia likely due to steroid.  A1c 6.1%. Recent Labs  Lab 09/30/19 0739 09/30/19 1144 09/30/19 1710 10/01/19 0741 10/01/19 1141  GLUCAP 216* 166* 203* 184* 244*  -Patient to resume home medications -Give prescription for sliding scale insulin while on steroid. Family comfortable handling this.  Mild hyponatremia: Na 131.  Stable. -Recheck in 2 weeks  Hypothyroidism -Continue home Synthroid  Hypogonadism -Continue home clomiphene and bromocriptine  Hyperlipidemia -Continue home WelChol  Osteoarthritis -As needed Tylenol  Overactive bladder -Continue home Myrbetriq  Leukocytosis/bandemia: Likely demargination from steroid.  Improving.    Body mass index is 20.53 kg/m.            Discharge Exam: Vitals:   09/30/19 1946 10/01/19 0422  BP: 112/69 123/77  Pulse: 87 86  Resp: 15 19  Temp: (!) 97.5 F (36.4 C) 98.5 F (36.9 C)  SpO2: 96% 93%    GENERAL: No apparent distress.  Nontoxic. HEENT: MMM.  Vision and hearing grossly intact.  NECK: Supple.  No apparent  JVD.  RESP: 93 to 94% on 1 L at rest. No IWOB.  Fair aeration with crackles bilaterally. CVS:  RRR. Heart sounds normal.  ABD/GI/GU: Bowel  sounds present. Soft. Non tender.  MSK/EXT:  Moves extremities. No apparent deformity. No edema.  SKIN: no apparent skin lesion or wound NEURO: Awake, alert and oriented appropriately.  No apparent focal neuro deficit. PSYCH: Calm. Normal affect.   Discharge Instructions  Discharge Instructions    Call MD for:  difficulty breathing, headache or visual disturbances   Complete by: As directed    Call MD for:  extreme fatigue   Complete by: As directed    Call MD for:  persistant dizziness or light-headedness   Complete by: As directed    Diet - low sodium heart healthy   Complete by: As directed    Diet Carb Modified   Complete by: As directed    Discharge instructions   Complete by: As directed    It has been a pleasure taking care of you!  You were hospitalized due to COVID-19 infection.  You have been started on steroid and remdesivir.  You have completed 5 days of remdesivir infusion.  We are discharging you on prednisone.   Prednisone could raise your blood glucose.  Would recommend checking the blood glucose about 15 minutes before each main meals, and injecting insulin on a sliding scale basis.  You can also continue taking your other diabetic medications   In regards to Covid infection, you are still potentially infectious for the next 7-15 days. We recommend you isolate yourself and take the necessary precautions to prevent the virus from spreading.  Some of the steps to prevent the virus from spreading to others: Stay away from other and members of your household at least for 7-15 days. Let healthy household members care for children and pets, if possible. If you have to care for children or pets, wash your hands often and wear a mask. If possible, stay in your own room, separate from others. Use a different bathroom.Make sure that all people in your household wash their hands well and often. Leave your house only to seek medical care. Do not use public transport. Do not  travel while you are sick. Wash your hands often with soap and water for 20 seconds. If soap and water are not available, use alcohol-based hand sanitizer. Cough or sneeze into a tissue or your sleeve or elbow. Do not cough or sneeze into your hand or into the air. Wear a cloth face covering or face mask.  Return precautions: Get help or return to the hospital right away if: You have trouble breathing. You have pain or pressure in your chest. You have confusion. You have bluish lips and fingernails. You have difficulty waking from sleep. You have symptoms that get worse. These symptoms may represent a serious problem that is an emergency. Do not wait to see if the symptoms will go away. Get medical help right away. Call your local emergency services (911 in the [place] Do not drive yourself to the hospital. Let the emergency medical personnel know if you think you have COVID-19.  To protect yourself in the future:  Do not travel to areas where COVID-19 is a risk. The areas where COVID-19 is reported change often. To identify high-risk areas and travel restrictions, check the CDC travel website: StageSync.siwwwnc.cdc.gov/travel/notices If you live in, or must travel to, an area where COVID-19 is a risk, take precautions to avoid infection. Stay away  from people who are sick. Wash your hands often with soap and water for 20 seconds. If soap and water are not available, use an alcohol-based hand sanitizer. Avoid touching your mouth, face, eyes, or nose. Avoid going out in public, follow guidance from your state and local health authorities. If you must go out in public, wear a cloth face covering or face mask. Disinfect objects and surfaces that are frequently touched every day. This may include: Counters and tables. Doorknobs and light switches. Sinks and faucets. Electronics, such as phones, remote controls, keyboards, computers, and tablets.    Where to find more information Centers for Disease  Control and Prevention: StickerEmporium.tn World Health Organization: https://thompson-craig.com/   Increase activity slowly   Complete by: As directed      Allergies as of 10/01/2019   No Known Allergies     Medication List    STOP taking these medications   amoxicillin 500 MG tablet Commonly known as: AMOXIL     TAKE these medications   Accu-Chek Guide test strip Generic drug: glucose blood USE TO CHECK BLOOD SUGAR 6  TIMES DAILY   albuterol 108 (90 Base) MCG/ACT inhaler Commonly known as: VENTOLIN HFA Inhale 2 puffs into the lungs every 4 (four) hours as needed for wheezing or shortness of breath.   ascorbic acid 500 MG tablet Commonly known as: VITAMIN C Take 1 tablet (500 mg total) by mouth daily.   bromocriptine 2.5 MG tablet Commonly known as: PARLODEL TAKE 1 TABLET BY MOUTH  DAILY   CALCIUM 600 PO Take 1 tablet by mouth daily.   chlorpheniramine-HYDROcodone 10-8 MG/5ML Suer Commonly known as: TUSSIONEX Take 5 mLs by mouth every 12 (twelve) hours as needed for cough.   clomiPHENE 50 MG tablet Commonly known as: CLOMID Take 0.5 tablets (25 mg total) by mouth daily.   colesevelam 625 MG tablet Commonly known as: WELCHOL TAKE 4 TABLETS BY MOUTH  DAILY   docusate sodium 100 MG capsule Commonly known as: COLACE Take 100 mg by mouth 2 (two) times daily.   Easy Touch Pen Needles 32G X 6 MM Misc Generic drug: Insulin Pen Needle 1 each by Other route daily. E11.9   Ferrous Sulfate Dried 45 MG Tbcr Take 1 tablet by mouth 2 (two) times daily.   GLUCOSAMINE CHONDR 1500 COMPLX PO Take 2 tablets by mouth daily.   HM Cinnamon 500 MG capsule Generic drug: Cinnamon Take 3,000 mg by mouth 2 (two) times daily.   insulin lispro 100 UNIT/ML injection Commonly known as: HUMALOG Correction coverage: Moderate Sliding Scale Insulin while on steroid CBG < 70: Implement Hypoglycemia Standing Orders and refer to Hypoglycemia Standing  Orders sidebar report CBG 70 - 120: 0 units CBG 121 - 150: 2 units CBG 151 - 200: 3 units CBG 201 - 250: 5 units CBG 251 - 300: 8 units CBG 301 - 350: 11 units CBG 351 - 400: 15 units CBG > 400: call MD and obtain STAT lab verification   INSULIN SYRINGE .5CC/30GX1/2" 30G X 1/2" 0.5 ML Misc Use one to inject insulin three times a day   levothyroxine 75 MCG tablet Commonly known as: SYNTHROID TAKE 1 TABLET BY MOUTH  DAILY   metFORMIN 500 MG 24 hr tablet Commonly known as: GLUCOPHAGE-XR Take 2 tablets (1,000 mg total) by mouth 2 (two) times daily.   multivitamin tablet Take 1 tablet by mouth daily.   Myrbetriq 25 MG Tb24 tablet Generic drug: mirabegron ER Take 50 mg by mouth daily.  omega-3 acid ethyl esters 1 g capsule Commonly known as: LOVAZA TAKE 2 CAPSULES BY MOUTH  TWICE DAILY   omeprazole 40 MG capsule Commonly known as: PRILOSEC TAKE 1 CAPSULE BY MOUTH  DAILY   pioglitazone 45 MG tablet Commonly known as: ACTOS TAKE 1 TABLET BY MOUTH  DAILY   predniSONE 10 MG (21) Tbpk tablet Commonly known as: STERAPRED UNI-PAK 21 TAB Per dose pack   Prolensa 0.07 % Soln Generic drug: Bromfenac Sodium Place 1 drop into both eyes daily.   repaglinide 2 MG tablet Commonly known as: PRANDIN TAKE 1 TABLET(2 MG) BY MOUTH THREE TIMES DAILY BEFORE MEALS   simvastatin 20 MG tablet Commonly known as: ZOCOR TAKE ONE-HALF TABLET BY  MOUTH AT BEDTIME   tadalafil 20 MG tablet Commonly known as: CIALIS Take 20 mg by mouth daily as needed for erectile dysfunction.   Trospium Chloride 60 MG Cp24   Turmeric 500 MG Caps Take 1,500 mg by mouth daily.   Victoza 18 MG/3ML Sopn Generic drug: liraglutide Inject 0.3 mLs (1.8 mg total) into the skin every morning.   zinc sulfate 220 (50 Zn) MG capsule Take 1 capsule (220 mg total) by mouth daily.       Consultations:  None  Procedures/Studies:   CT Angio Chest PE W and/or Wo Contrast  Result Date: 09/27/2019 CLINICAL  DATA:  Positive D-dimer. EXAM: CT ANGIOGRAPHY CHEST WITH CONTRAST TECHNIQUE: Multidetector CT imaging of the chest was performed using the standard protocol during bolus administration of intravenous contrast. Multiplanar CT image reconstructions and MIPs were obtained to evaluate the vascular anatomy. CONTRAST:  OMNIPAQUE IOHEXOL 350 MG/ML SOLN COMPARISON:  None. FINDINGS: Cardiovascular: Heart is normal size. Aorta is normal caliber. No filling defects in the pulmonary arteries to suggest pulmonary emboli. Mediastinum/Nodes: No mediastinal, hilar, or axillary adenopathy. Trachea and esophagus are unremarkable. Thyroid unremarkable. Lungs/Pleura: Extensive bilateral ground-glass airspace opacities compatible with COVID pneumonia. No effusions or pneumothorax. Upper Abdomen: Imaging into the upper abdomen demonstrates no acute findings. Musculoskeletal: Chest wall soft tissues are unremarkable. No acute bony abnormality. Review of the MIP images confirms the above findings. IMPRESSION: No evidence of pulmonary embolus. Extensive bilateral ground-glass airspace opacities compatible with COVID pneumonia. Electronically Signed   By: Charlett Nose M.D.   On: 09/27/2019 02:06   DG Chest Port 1 View  Result Date: 09/26/2019 CLINICAL DATA:  COVID EXAM: PORTABLE CHEST 1 VIEW COMPARISON:  None. FINDINGS: Patchy bilateral airspace opacities. Heart is normal size. No effusions. No acute bony abnormality. IMPRESSION: Patchy bilateral airspace disease compatible with COVID pneumonia. Electronically Signed   By: Charlett Nose M.D.   On: 09/26/2019 22:44        The results of significant diagnostics from this hospitalization (including imaging, microbiology, ancillary and laboratory) are listed below for reference.     Microbiology: Recent Results (from the past 240 hour(s))  SARS Coronavirus 2 by RT PCR (hospital order, performed in [facility] hospital lab) Nasopharyngeal Nasopharyngeal Swab     Status:  Abnormal   Collection Time: 09/27/19 12:20 AM   Specimen: Nasopharyngeal Swab  Result Value Ref Range Status   SARS Coronavirus 2 POSITIVE (A) NEGATIVE Final    Comment: RESULT CALLED TO, READ BACK BY AND VERIFIED WITH: J,TALKINGTON AT 0705 ON 09/27/19 BY A,MOHAMED (NOTE) SARS-CoV-2 target nucleic acids are DETECTED  SARS-CoV-2 RNA is generally detectable in upper respiratory specimens  during the acute phase of infection.  Positive results are indicative  of the presence of the  identified virus, but do not rule out bacterial infection or co-infection with other pathogens not detected by the test.  Clinical correlation with patient history and  other diagnostic information is necessary to determine patient infection status.  The expected result is negative.  Fact Sheet for Patients:   BoilerBrush.com.cy   Fact Sheet for Healthcare Providers:   https://pope.com/    This test is not yet approved or cleared by the [place] FDA and  has been authorized for detection and/or diagnosis of SARS-CoV-2 by FDA under an Emergency Use Authorization (EUA).  This EUA will remain in effect (meanin g this test can be used) for the duration of  the COVID-19 declaration under Section 564(b)(1) of the Act, 21 U.S.C. section 360-bbb-3(b)(1), unless the authorization is terminated or revoked sooner.  Performed at [facility], [address] [place], [place] [address]      Labs: BNP (last 3 results) No results for input(s): BNP in the last 8760 hours. Basic Metabolic Panel: Recent Labs  Lab 09/26/19 2234 09/28/19 0656 09/29/19 0429 09/30/19 0414 10/01/19 0015  NA 132* 134* 132* 134* 131*  K 3.9 4.4 4.0 4.4 3.8  CL 99 101 102 101 99  CO2 23 23 22 22  21*  GLUCOSE 84 192* 231* 217* 265*  BUN 17 24* 27* 22 30*  CREATININE 1.03 1.02 0.87 0.90 0.95  CALCIUM 8.5* 8.7* 8.3* 8.5* 8.8*  MG  --   --   --   --  2.4  PHOS  --    --   --   --  2.7   Liver Function Tests: Recent Labs  Lab 09/26/19 2234 09/28/19 0656 09/29/19 0429 09/30/19 0414 10/01/19 0015  AST 37 26 25 25 27   ALT 21 19 19 21 26   ALKPHOS 44 43 43 46 50  BILITOT 0.6 0.2* 0.3 0.5 0.6  PROT 6.5 6.2* 6.0* 5.8* 6.4*  ALBUMIN 3.4* 3.0* 2.8* 3.0* 3.3*   No results for input(s): LIPASE, AMYLASE in the last 168 hours. No results for input(s): AMMONIA in the last 168 hours. CBC: Recent Labs  Lab 09/26/19 2234 09/28/19 0656 09/29/19 0429 09/30/19 0414 10/01/19 0015  WBC 8.2 15.6* 14.1* 12.8* 12.3*  NEUTROABS 6.9 14.2* 13.3* 11.9* 11.2*  HGB 13.4 13.3 12.4* 12.7* 13.4  HCT 41.3 39.3 37.3* 38.5* 40.2  MCV 90.0 89.3 88.8 91.0 90.1  PLT 196 227 255 280 309   Cardiac Enzymes: No results for input(s): CKTOTAL, CKMB, CKMBINDEX, TROPONINI in the last 168 hours. BNP: Invalid input(s): POCBNP CBG: Recent Labs  Lab 09/29/19 2018 09/30/19 0739 09/30/19 1144 09/30/19 1710 10/01/19 0741  GLUCAP 113* 216* 166* 203* 184*   D-Dimer Recent Labs    09/30/19 0414 10/01/19 0015  DDIMER 1.16* 1.31*   Hgb A1c No results for input(s): HGBA1C in the last 72 hours. Lipid Profile No results for input(s): CHOL, HDL, LDLCALC, TRIG, CHOLHDL, LDLDIRECT in the last 72 hours. Thyroid function studies No results for input(s): TSH, T4TOTAL, T3FREE, THYROIDAB in the last 72 hours.  Invalid input(s): FREET3 Anemia work up No results for input(s): VITAMINB12, FOLATE, FERRITIN, TIBC, IRON, RETICCTPCT in the last 72 hours. Urinalysis    Component Value Date/Time   COLORURINE YELLOW 12/03/2016 1435   APPEARANCEUR CLEAR 12/03/2016 1435   LABSPEC 1.020 12/03/2016 1435   PHURINE 5.5 12/03/2016 1435   GLUCOSEU NEGATIVE 12/03/2016 1435   HGBUR NEGATIVE 12/03/2016 1435   BILIRUBINUR NEGATIVE 04/28/2018 0947   KETONESUR NEGATIVE 12/03/2016 1435  PROTEINUR Negative 04/28/2018 0947   UROBILINOGEN 0.2 04/28/2018 0947   UROBILINOGEN 0.2 12/03/2016 1435    NITRITE NEGATIVE 04/28/2018 0947   NITRITE NEGATIVE 12/03/2016 1435   LEUKOCYTESUR Negative 04/28/2018 0947   Sepsis Labs Invalid input(s): PROCALCITONIN,  WBC,  LACTICIDVEN   Time coordinating discharge: 35 minutes  SIGNED:  Almon Hercules, MD  [facility] 10/01/2019, 10:09 AM  If 7PM-7AM, please contact night-coverage [email or web address]

## 2019-10-01 NOTE — Telephone Encounter (Signed)
Called the patient to schedule but Dr. Jimmey Ralph is not in the day he is wanting to schedule patient wants to know if he should do it the Tuesday before or the Tuesday after please advise.

## 2019-10-01 NOTE — Progress Notes (Addendum)
SATURATION QUALIFICATIONS: (This note is used to comply with regulatory documentation for home oxygen)      Patient Saturations on 1 Liters of oxygen while Ambulating = 91%  Please briefly explain why patient needs home oxygen:This patient's oxygen level falls below therapeutic levels without supplemental oxygen Patient Saturations on Room Air at Rest = 90%  Patient Saturations on Room Air while Ambulating = 83%

## 2019-10-01 NOTE — Care Management Important Message (Signed)
Important Message  Patient Details IM Letter given to the Patient Name: David Maynard MRN: [id number] Date of Birth: 1951-01-11   Medicare Important Message Given:  Yes     [name] 10/01/2019, 11:00 AM

## 2019-10-01 NOTE — TOC Transition Note (Signed)
Transition of Care [facility]) - CM/SW Discharge Note   Patient Details  Name: KIPPY GOHMAN MRN: [id number] Date of Birth: 1950/02/25  [facility] CM/SW Contact:  Ida Rogue, LCSW Phone Number: 10/01/2019, 10:11 AM   Clinical Narrative:  Patient who is discharging today is in need of home O2, has ride home. Contacted James with [employer] to set up home delivery of both travel unit and home unit. Family will then come pick up patient with travel unit. SAT note and orders seen and appreciated.  TOC sign off.    Final next level of care: Home/Self Care Barriers to Discharge: Barriers Resolved   Patient Goals and CMS Choice Patient states their goals for this hospitalization and ongoing recovery are:: prefers to go home CMS Medicare.gov Compare Post Acute Care list provided to:: Patient Choice offered to / list presented to : Spouse  Discharge Placement                       Discharge Plan and Services In-house Referral: Clinical Social Work Discharge Planning Services: CM Consult Post Acute Care Choice: Durable Medical Equipment          DME Arranged: Oxygen                    Social Determinants of Health (SDOH) Interventions     Readmission Risk Interventions No flowsheet data found.

## 2019-10-02 NOTE — Telephone Encounter (Signed)
Patient has been scheduled for 9/20 for a HFU

## 2019-10-07 ENCOUNTER — Telehealth: Payer: Self-pay | Admitting: Family Medicine

## 2019-10-07 NOTE — Telephone Encounter (Signed)
Patient called in this morning asking if Dr.Parker needs him to continue the predniSONE (STERAPRED UNI-PAK 21 TAB) 10 MG (21) TBPK tablet, as [facility] has refilled it but doesn't know if he needs it.

## 2019-10-07 NOTE — Telephone Encounter (Signed)
Please advise 

## 2019-10-08 NOTE — Telephone Encounter (Signed)
Please advise. Appointment on 10/12/19

## 2019-10-08 NOTE — Telephone Encounter (Signed)
Pt is following up on this issue again. Please let him know if he should take this prednisone.

## 2019-10-09 ENCOUNTER — Encounter: Payer: Self-pay | Admitting: Family Medicine

## 2019-10-09 NOTE — Telephone Encounter (Signed)
Pt aware to use cortisone cream. Has appointment with PCP on Monday  Stated rash is improving

## 2019-10-09 NOTE — Telephone Encounter (Signed)
Pt was notified Per Dr Jimmey Ralph if he is feeling better now, would not recommend starting Patient stated feeling and aware not to take prednisone at this time

## 2019-10-09 NOTE — Telephone Encounter (Signed)
Pt is wanting to know if he should take prednisone. I know Dr Jimmey Ralph is not in office, could we send to another provider?

## 2019-10-09 NOTE — Telephone Encounter (Signed)
Please advise 

## 2019-10-09 NOTE — Telephone Encounter (Signed)
Looks like this was prescribed by the hospital - if he is feeling better now would not recommend starting.  Katina Degree. Jimmey Ralph, MD 10/09/2019 12:28 PM

## 2019-10-11 ENCOUNTER — Encounter: Payer: Self-pay | Admitting: Family Medicine

## 2019-10-12 ENCOUNTER — Encounter: Payer: Self-pay | Admitting: Family Medicine

## 2019-10-12 ENCOUNTER — Other Ambulatory Visit: Payer: Self-pay

## 2019-10-12 ENCOUNTER — Ambulatory Visit (INDEPENDENT_AMBULATORY_CARE_PROVIDER_SITE_OTHER): Payer: Medicare Other | Admitting: Family Medicine

## 2019-10-12 VITALS — BP 122/65 | HR 83 | Temp 98.0°F | Ht 68.0 in | Wt 129.4 lb

## 2019-10-12 DIAGNOSIS — U071 COVID-19: Secondary | ICD-10-CM

## 2019-10-12 DIAGNOSIS — E119 Type 2 diabetes mellitus without complications: Secondary | ICD-10-CM

## 2019-10-12 NOTE — Patient Instructions (Signed)
It was very nice to see you today!  I am glad that you are feeling better.  You can stop the supplemental oxygen.  It is okay for you to walk-in to get a chest x-ray in a few weeks.  I will see back in a few months for your physical.  Please come back  sooner if needed.  Take care, Dr Jimmey Ralph  Please try these tips to maintain a healthy lifestyle:   Eat at least 3 REAL meals and 1-2 snacks per day.  Aim for no more than 5 hours between eating.  If you eat breakfast, please do so within one hour of getting up.    Each meal should contain half fruits/vegetables, one quarter protein, and one quarter carbs (no bigger than a computer mouse)   Cut down on sweet beverages. This includes juice, soda, and sweet tea.     Drink at least 1 glass of water with each meal and aim for at least 8 glasses per day   Exercise at least 150 minutes every week.

## 2019-10-12 NOTE — Progress Notes (Signed)
Chief Complaint:  David Maynard is a 69 y.o. male who presents today for a hospital follow up visit.  Assessment/Plan:  New/Acute Problems: COVID 19 Normal lung exam today.  Seems to be recovering normally.  We will stop supplemental oxygen.  Was able to sat at 95 to 97% on room air today.  He would like to recheck chest x-ray in about 3 weeks.  Will place future order.  Anticipabbbbbbbbbbbbbbbbbbbbbbbbbbbbbte full recovery over the next few weeks.  He will be eligible for booster shot 90 days after his monoclonal antibody infusion.  Chronic Problems Addressed Today: Diabetes mellitus, type II (HCC) Was briefly on sliding scale due to systemic steroids but is no longer taking.  Sugars have been well controlled.      Subjective:  HPI:  Summary of Hospital admission: Reason for admission: COVID Date of admission: 09/26/2019 Date of discharge: 10/01/2019 Summary of Hospital course: Patient presented to the ED on 09/26/2019 with 10 days of covid symptoms Including shortness of breath, fevers, and chills.  He completed a course of regeneron as an outpatient. On presentation to the ED was found to be hypoxemic and tachypneic.  X-ray showed Covid pneumonia.  He was admitted and treated with aspirin, Decadron, and remdesivir.  He was discharged home on hospital day 5 with a course oral steroids.  Interim history: Patient has done well since being home.  He has been home for the past week and a half.  He has been resting most days but has been trying to be more active.  He is able to walk around backyard and around his neighborhood without any supplemental oxygen.  No reported chest pain or shortness of breath.  He developed a rash on his back a few days ago.  Tried using over-the-counter creams including cortisone and Benadryl which seems to have helped.  Symptoms are much better today than they were a few days ago.  ROS: Per HPI, otherwise a complete review of systems was negative.    PMH:  The following were reviewed and entered/updated in epic: Past Medical History:  Diagnosis Date  . Anemia    history of anemia  . 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 Depressive disorder, not elsewhere classified    none  . Diabetes mellitus type II   . Hypercholesterolemia   . Hypogonadism male   . Primary localized osteoarthritis of left knee 12/11/2017  . Primary localized osteoarthritis of right knee 10/23/2017  . 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 Thyroid disease   . Unspecified hypothyroidism    Patient Active Problem List   Diagnosis Date Noted  . Gastroesophageal reflux disease 01/13/2019  . BPH  01/08/2018  . ED (erectile dysfunction) 01/08/2018  . OAB (overactive bladder) 01/08/2018  . History of bilateral knee replacement 12/11/2017  . Lumbar disc disease 09/22/2010  . Diabetes mellitus, type II (HCC)   . Hypogonadism male 06/19/2007  . Dyslipidemia associated with type 2 diabetes mellitus (HCC) 06/19/2007  . Hypothyroidism 03/05/2007   Past Surgical History:  Procedure Laterality Date  . ESOPHAGOGASTRODUODENOSCOPY  06/16/2003  . INGUINAL HERNIA REPAIR     left  . JOINT REPLACEMENT    . KNEE ARTHROSCOPY     Left  . KNEE ARTHROSCOPY W/ ACL RECONSTRUCTION     left  . ROTATOR CUFF REPAIR     Right x2  . Stress Cardiolite  07/15/2000  . TOTAL KNEE ARTHROPLASTY Right 11/04/2017  . TOTAL KNEE ARTHROPLASTY Right 11/04/2017  Procedure: TOTAL KNEE ARTHROPLASTY;  Surgeon: Salvatore MarvelWainer, Robert, MD;  [facility];  Service: Orthopedics;  Laterality: Right;  . TOTAL KNEE ARTHROPLASTY Left 12/23/2017  . TOTAL KNEE ARTHROPLASTY Left 12/23/2017   Procedure: TOTAL KNEE ARTHROPLASTY;  Surgeon: Salvatore MarvelWainer, Robert, MD;  [facility];  Service: Orthopedics;  Laterality: Left;  Marland Kitchen. VASECTOMY  05/2002    Family History  Problem Relation Age of Onset  . Breast cancer Neg Hx   . Celiac disease Neg Hx   . Cirrhosis Neg Hx   . Clotting disorder Neg Hx   . Colitis  Neg Hx   . Colon cancer Neg Hx   . Colon polyps Neg Hx   . Crohn's disease Neg Hx   . Cystic fibrosis Neg Hx   . Diabetes Neg Hx   . Esophageal cancer Neg Hx   . Heart disease Neg Hx   . Hemochromatosis Neg Hx   . Inflammatory bowel disease Neg Hx   . Irritable bowel syndrome Neg Hx   . Kidney disease Neg Hx   . Liver cancer Neg Hx   . Liver disease Neg Hx   . Ovarian cancer Neg Hx   . Pancreatic cancer Neg Hx   . Prostate cancer Neg Hx   . Rectal cancer Neg Hx   . Stomach cancer Neg Hx   . Ulcerative colitis Neg Hx   . Uterine cancer Neg Hx   . Wilson's disease Neg Hx     Medications- Reconciled discharge and current medications in Epic.  Current Outpatient Medications  Medication Sig Dispense Refill  . ACCU-CHEK GUIDE test strip USE TO CHECK BLOOD SUGAR 6  TIMES DAILY 600 strip 3  . ascorbic acid (VITAMIN C) 500 MG tablet Take 1 tablet (500 mg total) by mouth daily. 20 tablet 0  . bromocriptine (PARLODEL) 2.5 MG tablet TAKE 1 TABLET BY MOUTH  DAILY 90 tablet 3  . Calcium Carbonate (CALCIUM 600 PO) Take 1 tablet by mouth daily.    . chlorpheniramine-HYDROcodone (TUSSIONEX) 10-8 MG/5ML SUER Take 5 mLs by mouth every 12 (twelve) hours as needed for cough. 140 mL 0  . Cinnamon (HM CINNAMON) 500 MG capsule Take 3,000 mg by mouth 2 (two) times daily.    . clomiPHENE (CLOMID) 50 MG tablet Take 0.5 tablets (25 mg total) by mouth daily. 45 tablet 3  . colesevelam (WELCHOL) 625 MG tablet TAKE 4 TABLETS BY MOUTH  DAILY 360 tablet 3  . diphenhydrAMINE (BENADRYL) 25 MG tablet Take 25 mg by mouth every 6 (six) hours as needed.    . docusate sodium (COLACE) 100 MG capsule Take 100 mg by mouth 2 (two) times daily.    . Ferrous Sulfate Dried 45 MG TBCR Take 1 tablet by mouth 2 (two) times daily.    . Glucosamine-Chondroit-Vit C-Mn (GLUCOSAMINE CHONDR 1500 COMPLX PO) Take 2 tablets by mouth daily.    Marland Kitchen. levothyroxine (SYNTHROID) 75 MCG tablet TAKE 1 TABLET BY MOUTH  DAILY 90 tablet 0  .  liraglutide (VICTOZA) 18 MG/3ML SOPN Inject 0.3 mLs (1.8 mg total) into the skin every morning. 27 mL 3  . metFORMIN (GLUCOPHAGE-XR) 500 MG 24 hr tablet Take 2 tablets (1,000 mg total) by mouth 2 (two) times daily. 360 tablet 3  . mirabegron ER (MYRBETRIQ) 25 MG TB24 tablet Take 50 mg by mouth daily.    . Multiple Vitamin (MULTIVITAMIN) tablet Take 1 tablet by mouth daily.    Marland Kitchen. omega-3 acid ethyl esters (LOVAZA) 1 g capsule TAKE  2 CAPSULES BY MOUTH  TWICE DAILY 360 capsule 3  . omeprazole (PRILOSEC) 40 MG capsule TAKE 1 CAPSULE BY MOUTH  DAILY 90 capsule 3  . pioglitazone (ACTOS) 45 MG tablet TAKE 1 TABLET BY MOUTH  DAILY 90 tablet 3  . PROLENSA 0.07 % SOLN Place 1 drop into both eyes daily.    . repaglinide (PRANDIN) 2 MG tablet TAKE 1 TABLET(2 MG) BY MOUTH THREE TIMES DAILY BEFORE MEALS 90 tablet 2  . simvastatin (ZOCOR) 20 MG tablet TAKE ONE-HALF TABLET BY  MOUTH AT BEDTIME 45 tablet 3  . tadalafil (ADCIRCA/CIALIS) 20 MG tablet Take 20 mg by mouth daily as needed for erectile dysfunction.    . Trospium Chloride 60 MG CP24     . Turmeric 500 MG CAPS Take 1,500 mg by mouth daily.    Marland Kitchen zinc sulfate 220 (50 Zn) MG capsule Take 1 capsule (220 mg total) by mouth daily. 20 capsule 0  . albuterol (VENTOLIN HFA) 108 (90 Base) MCG/ACT inhaler Inhale 2 puffs into the lungs every 4 (four) hours as needed for wheezing or shortness of breath. (Patient not taking: Reported on 10/12/2019) 1 each 1   No current facility-administered medications for this visit.    Allergies-reviewed and updated No Known Allergies  Social History   Socioeconomic History  . Marital status: Married    Spouse name: Not on file  . Number of children: Not on file  . Years of education: Not on file  . Highest education level: Not on file  Occupational History  . Occupation: Retired  Tobacco Use  . Smoking status: Never Smoker  . Smokeless tobacco: Never Used  Vaping Use  . Vaping Use: Never used  Substance and Sexual  Activity  . Alcohol use: Yes    Alcohol/week: 1.0 standard drink    Types: 1 Cans of beer per week    Comment: weekly  . Drug use: No  . Sexual activity: Not on file  Other Topics Concern  . Not on file  Social History Narrative  . Not on file   Social Determinants of Health   Financial Resource Strain: Low Risk   . Difficulty of Paying Living Expenses: Not hard at all  Food Insecurity: No Food Insecurity  . Worried About Programme researcher, broadcasting/film/video in the Last Year: Never true  . Ran Out of Food in the Last Year: Never true  Transportation Needs: No Transportation Needs  . Lack of Transportation (Medical): No  . Lack of Transportation (Non-Medical): No  Physical Activity: Insufficiently Active  . Days of Exercise per Week: 5 days  . Minutes of Exercise per Session: 20 min  Stress: No Stress Concern Present  . Feeling of Stress : Not at all  Social Connections: Socially Integrated  . Frequency of Communication with Friends and Family: More than three times a week  . Frequency of Social Gatherings with Friends and Family: More than three times a week  . Attends Religious Services: More than 4 times per year  . Active Member of Clubs or Organizations: Yes  . Attends Banker Meetings: 1 to 4 times per year  . Marital Status: Married        Objective:  Physical Exam: BP 122/65   Pulse 83   Temp 98 F (36.7 C) (Temporal)   Ht 5\' 8"  (1.727 m)   Wt 129 lb 6.4 oz (58.7 kg)   SpO2 98%   BMI 19.68 kg/m   Gen: NAD, resting  comfortably CV: RRR with no murmurs appreciated Pulm: NWOB, CTAB with no crackles, wheezes, or rhonchi GI: Normal bowel sounds present. Soft, Nontender, Nondistended. MSK: No edema, cyanosis, or clubbing noted Skin: Warm, dry Neuro: Grossly normal, moves all extremities Psych: Normal affect and thought content  Time Spent: 45 minutes of total time was spent on the date of the encounter performing the following actions: chart review prior to  seeing the patient including recent hospitalization, obtaining history, performing a medically necessary exam, counseling on the treatment plan, placing orders, and documenting in our EHR.        Katina Degree. Jimmey Ralph, MD 10/12/2019 11:55 AM

## 2019-10-12 NOTE — Telephone Encounter (Signed)
FYI  Pt has appointment tomorrow

## 2019-10-12 NOTE — Assessment & Plan Note (Signed)
Was briefly on sliding scale due to systemic steroids but is no longer taking.  Sugars have been well controlled.

## 2019-10-19 ENCOUNTER — Telehealth: Payer: Self-pay | Admitting: Family Medicine

## 2019-10-19 ENCOUNTER — Ambulatory Visit: Payer: Medicare Other | Admitting: Endocrinology

## 2019-10-19 NOTE — Telephone Encounter (Signed)
I spoke with [facility] to give verbal order, paperwork with order needs to be faxed instead.

## 2019-10-19 NOTE — Telephone Encounter (Signed)
Patient states he has been able to be off his Oxygen for a week and patient stated dr.parker told him if he could do it for a week he would be able to have [facility] pick It up.  Patient called [facility] to have them come get it and they told him if they came and go it then he would need to sign a paper  going against medical advice, [facility] told patient his PCP would need to call and let them know he was told he would not need it anymore, so patient can have it picked up  [facility] [phone]  Patient is also requesting a call back after we speak to them to know when they will be picking it up.

## 2019-10-23 NOTE — Telephone Encounter (Signed)
Will write order and we can fax over.  Katina Degree. Jimmey Ralph, MD 10/23/2019 2:27 PM

## 2019-10-23 NOTE — Telephone Encounter (Signed)
[facility] requesting writing prescription DC Oxygen fax to [phone]

## 2019-10-23 NOTE — Telephone Encounter (Signed)
Pt called to check the status of this matter. He asks that we call him once paperwork is faxed.

## 2019-10-23 NOTE — Telephone Encounter (Signed)
Rx fax to [facility]

## 2019-10-26 ENCOUNTER — Ambulatory Visit (INDEPENDENT_AMBULATORY_CARE_PROVIDER_SITE_OTHER): Payer: Medicare Other | Admitting: Endocrinology

## 2019-10-26 ENCOUNTER — Other Ambulatory Visit: Payer: Self-pay

## 2019-10-26 ENCOUNTER — Encounter: Payer: Self-pay | Admitting: Endocrinology

## 2019-10-26 VITALS — BP 119/70 | HR 92 | Ht 68.0 in | Wt 134.8 lb

## 2019-10-26 DIAGNOSIS — Z23 Encounter for immunization: Secondary | ICD-10-CM

## 2019-10-26 DIAGNOSIS — E119 Type 2 diabetes mellitus without complications: Secondary | ICD-10-CM | POA: Diagnosis not present

## 2019-10-26 LAB — POCT GLYCOSYLATED HEMOGLOBIN (HGB A1C): Hemoglobin A1C: 6.4 % — AB (ref 4.0–5.6)

## 2019-10-26 MED ORDER — NATEGLINIDE 120 MG PO TABS
120.0000 mg | ORAL_TABLET | Freq: Three times a day (TID) | ORAL | 3 refills | Status: DC
Start: 2019-10-26 — End: 2020-05-16

## 2019-10-26 MED ORDER — REPAGLINIDE 2 MG PO TABS: 4.0000 mg | ORAL_TABLET | Freq: Three times a day (TID) | ORAL | 3 refills | Status: DC

## 2019-10-26 NOTE — Progress Notes (Signed)
Subjective:    Patient ID: David Maynard, male    DOB: 1950-09-24, 69 y.o.   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   Dx'ed: 2008 Complications: none  Therapy: Victoza + 5 oral meds.   DKA: never.  Severe hypoglycemia: never.  Pancreatitis: never.  Other: he has never taken insulin.  Interval history: He was in hosp a few weeks ago, with coronavirus, despite prior vaccination.  He he was rx'ed with steroids.  He took insulin until the taper was done.  pt states he feels much better in general.  He wants to take repaglinide with meals, and nateglinide with snacks.  Past Medical History:  Diagnosis Date  . Anemia    history of anemia  . 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 Depressive disorder, not elsewhere classified    none  . Diabetes mellitus type II   . Hypercholesterolemia   . Hypogonadism male   . Primary localized osteoarthritis of left knee 12/11/2017  . Primary localized osteoarthritis of right knee 10/23/2017  . 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 Thyroid disease   . Unspecified hypothyroidism     Past Surgical History:  Procedure Laterality Date  . ESOPHAGOGASTRODUODENOSCOPY  06/16/2003  . INGUINAL HERNIA REPAIR     left  . JOINT REPLACEMENT    . KNEE ARTHROSCOPY     Left  . KNEE ARTHROSCOPY W/ ACL RECONSTRUCTION     left  . ROTATOR CUFF REPAIR     Right x2  . Stress Cardiolite  07/15/2000  . TOTAL KNEE ARTHROPLASTY Right 11/04/2017  . TOTAL KNEE ARTHROPLASTY Right 11/04/2017   Procedure: TOTAL KNEE ARTHROPLASTY;  Surgeon: Salvatore Marvel, MD;  Location[facility] OR;  Service: Orthopedics;  Laterality: Right;  . TOTAL KNEE ARTHROPLASTY Left 12/23/2017  . TOTAL KNEE ARTHROPLASTY Left 12/23/2017   Procedure: TOTAL KNEE ARTHROPLASTY;  Surgeon: Salvatore Marvel, MD;  Location: [facility] OR;  Service: Orthopedics;  Laterality: Left;  Marland Kitchen VASECTOMY   05/2002    Social History   Socioeconomic History  . Marital status: Married    Spouse name: Not on file  . Number of children: Not on file  . Years of education: Not on file  . Highest education level: Not on file  Occupational History  . Occupation: Retired  Tobacco Use  . Smoking status: Never Smoker  . Smokeless tobacco: Never Used  Vaping Use  . Vaping Use: Never used  Substance and Sexual Activity  . Alcohol use: Yes    Alcohol/week: 1.0 standard drink    Types: 1 Cans of beer per week    Comment: weekly  . Drug use: No  . Sexual activity: Not on file  Other Topics Concern  . Not on file  Social History Narrative  . Not on file   Social Determinants of Health   Financial Resource Strain: Low Risk   . Difficulty of Paying Living Expenses: Not hard at all  Food Insecurity: No Food Insecurity  . Worried About Programme researcher, broadcasting/film/video in the Last Year: Never true  . Ran Out of Food in the Last Year: Never true  Transportation Needs: No Transportation Needs  . Lack of Transportation (Medical): No  . Lack of Transportation (Non-Medical): No  Physical Activity: Insufficiently Active  . Days of Exercise per Week: 5 days  .  Minutes of Exercise per Session: 20 min  Stress: No Stress Concern Present  . Feeling of Stress : Not at all  Social Connections: Socially Integrated  . Frequency of Communication with Friends and Family: More than three times a week  . Frequency of Social Gatherings with Friends and Family: More than three times a week  . Attends Religious Services: More than 4 times per year  . Active Member of Clubs or Organizations: Yes  . Attends Banker Meetings: 1 to 4 times per year  . Marital Status: Married  Catering manager Violence: Not At Risk  . Fear of Current or Ex-Partner: No  . Emotionally Abused: No  . Physically Abused: No  . Sexually Abused: No    Current Outpatient Medications on File Prior to Visit  Medication Sig Dispense  Refill  . ACCU-CHEK GUIDE test strip USE TO CHECK BLOOD SUGAR 6  TIMES DAILY 600 strip 3  . ascorbic acid (VITAMIN C) 500 MG tablet Take 1 tablet (500 mg total) by mouth daily. 20 tablet 0  . bromocriptine (PARLODEL) 2.5 MG tablet TAKE 1 TABLET BY MOUTH  DAILY 90 tablet 3  . Calcium Carbonate (CALCIUM 600 PO) Take 1 tablet by mouth daily.    . chlorpheniramine-HYDROcodone (TUSSIONEX) 10-8 MG/5ML SUER Take 5 mLs by mouth every 12 (twelve) hours as needed for cough. 140 mL 0  . Cinnamon (HM CINNAMON) 500 MG capsule Take 3,000 mg by mouth 2 (two) times daily.    . clomiPHENE (CLOMID) 50 MG tablet Take 0.5 tablets (25 mg total) by mouth daily. 45 tablet 3  . colesevelam (WELCHOL) 625 MG tablet TAKE 4 TABLETS BY MOUTH  DAILY 360 tablet 3  . diphenhydrAMINE (BENADRYL) 25 MG tablet Take 25 mg by mouth every 6 (six) hours as needed.    . docusate sodium (COLACE) 100 MG capsule Take 100 mg by mouth 2 (two) times daily.    . Ferrous Sulfate Dried 45 MG TBCR Take 1 tablet by mouth 2 (two) times daily.    . Glucosamine-Chondroit-Vit C-Mn (GLUCOSAMINE CHONDR 1500 COMPLX PO) Take 2 tablets by mouth daily.    Marland Kitchen levothyroxine (SYNTHROID) 75 MCG tablet TAKE 1 TABLET BY MOUTH  DAILY 90 tablet 0  . liraglutide (VICTOZA) 18 MG/3ML SOPN Inject 0.3 mLs (1.8 mg total) into the skin every morning. 27 mL 3  . metFORMIN (GLUCOPHAGE-XR) 500 MG 24 hr tablet Take 2 tablets (1,000 mg total) by mouth 2 (two) times daily. 360 tablet 3  . mirabegron ER (MYRBETRIQ) 25 MG TB24 tablet Take 50 mg by mouth daily.    . Multiple Vitamin (MULTIVITAMIN) tablet Take 1 tablet by mouth daily.    Marland Kitchen omega-3 acid ethyl esters (LOVAZA) 1 g capsule TAKE 2 CAPSULES BY MOUTH  TWICE DAILY 360 capsule 3  . omeprazole (PRILOSEC) 40 MG capsule TAKE 1 CAPSULE BY MOUTH  DAILY 90 capsule 3  . pioglitazone (ACTOS) 45 MG tablet TAKE 1 TABLET BY MOUTH  DAILY 90 tablet 3  . PROLENSA 0.07 % SOLN Place 1 drop into both eyes daily.    . simvastatin (ZOCOR) 20  MG tablet TAKE ONE-HALF TABLET BY  MOUTH AT BEDTIME 45 tablet 3  . tadalafil (ADCIRCA/CIALIS) 20 MG tablet Take 20 mg by mouth daily as needed for erectile dysfunction.    . Trospium Chloride 60 MG CP24     . Turmeric 500 MG CAPS Take 1,500 mg by mouth daily.    Marland Kitchen albuterol (VENTOLIN HFA) 108 (90  Base) MCG/ACT inhaler Inhale 2 puffs into the lungs every 4 (four) hours as needed for wheezing or shortness of breath. (Patient not taking: Reported on 10/12/2019) 1 each 1  . zinc sulfate 220 (50 Zn) MG capsule Take 1 capsule (220 mg total) by mouth daily. (Patient not taking: Reported on 10/26/2019) 20 capsule 0   No current facility-administered medications on file prior to visit.    No Known Allergies  Family History  Problem Relation Age of Onset  . Breast cancer Neg Hx   . Celiac disease Neg Hx   . Cirrhosis Neg Hx   . Clotting disorder Neg Hx   . Colitis Neg Hx   . Colon cancer Neg Hx   . Colon polyps Neg Hx   . Crohn's disease Neg Hx   . Cystic fibrosis Neg Hx   . Diabetes Neg Hx   . Esophageal cancer Neg Hx   . Heart disease Neg Hx   . Hemochromatosis Neg Hx   . Inflammatory bowel disease Neg Hx   . Irritable bowel syndrome Neg Hx   . Kidney disease Neg Hx   . Liver cancer Neg Hx   . Liver disease Neg Hx   . Ovarian cancer Neg Hx   . Pancreatic cancer Neg Hx   . Prostate cancer Neg Hx   . Rectal cancer Neg Hx   . Stomach cancer Neg Hx   . Ulcerative colitis Neg Hx   . Uterine cancer Neg Hx   . Wilson's disease Neg Hx     BP 119/70   Pulse 92   Ht 5\' 8"  (1.727 m)   Wt 134 lb 12.8 oz (61.1 kg)   SpO2 92%   BMI 20.50 kg/m   Review of Systems     Objective:   Physical Exam VITAL SIGNS:  See vs page.   GENERAL: no distress.    A1c=6.4%     Assessment & Plan:  Type 2 DM, worse: side-effect of steroids.     Patient Instructions  check your blood sugar 4 times a day: before the 3 meals, and at bedtime.  also check if you have symptoms of your blood sugar  being too high or too low.  please keep a record of the readings and bring it to your next appointment here (or you can bring the meter itself).  You can write it on any piece of paper.  please call sooner if your blood sugar goes below 70, or if you have a lot of readings over 200.  I have sent 2 prescriptions to your pharmacy: to increase repaglinide, and to resume nateglinide. Please continue the same other diabetes medications.   Please come back for a follow-up appointment in 3-4 months.

## 2019-10-26 NOTE — Patient Instructions (Signed)
check your blood sugar 4 times a day: before the 3 meals, and at bedtime.  also check if you have symptoms of your blood sugar being too high or too low.  please keep a record of the readings and bring it to your next appointment here (or you can bring the meter itself).  You can write it on any piece of paper.  please call us sooner if your blood sugar goes below 70, or if you have a lot of readings over 200.  I have sent 2 prescriptions to your pharmacy: to increase repaglinide, and to resume nateglinide. Please continue the same other diabetes medications.   Please come back for a follow-up appointment in 3-4 months.

## 2019-10-31 ENCOUNTER — Other Ambulatory Visit: Payer: Self-pay | Admitting: Endocrinology

## 2019-11-07 ENCOUNTER — Encounter: Payer: Self-pay | Admitting: Endocrinology

## 2019-11-10 ENCOUNTER — Telehealth: Payer: Self-pay | Admitting: Endocrinology

## 2019-11-10 ENCOUNTER — Other Ambulatory Visit: Payer: Self-pay | Admitting: Endocrinology

## 2019-11-10 DIAGNOSIS — E119 Type 2 diabetes mellitus without complications: Secondary | ICD-10-CM

## 2019-11-10 MED ORDER — METFORMIN HCL ER 500 MG PO TB24: 1000.0000 mg | ORAL_TABLET | Freq: Two times a day (BID) | ORAL | 0 refills | Status: DC

## 2019-11-10 NOTE — Telephone Encounter (Signed)
Patient is out of town and did not pack his Metformin. Patient requests an emergency 5 day supply of metFORMIN (GLUCOPHAGE-XR) 500 MG 24 hr tablet (ok'd by [employer]) be sent to: [facility] located in [address] [place] in [place], [place] [address]. Patient requests to be called at ph# [phone] once RX has been sent to the above listed PHARM so that patient knows when he can pick up the medication.

## 2019-11-10 NOTE — Telephone Encounter (Signed)
Rx sent- patient aware.  

## 2019-11-11 MED ORDER — METFORMIN HCL ER 500 MG PO TB24: 1000.0000 mg | ORAL_TABLET | Freq: Two times a day (BID) | ORAL | 0 refills | Status: DC

## 2019-11-11 NOTE — Telephone Encounter (Signed)
Patient states he takes 4 pills a day and the amount of pills he got was incorrect - he needs another Rx called in so they can last him. Patient ph# [phone]

## 2019-11-11 NOTE — Telephone Encounter (Signed)
Rx resent to [facility]. Patient have been notified

## 2019-11-11 NOTE — Addendum Note (Signed)
Addended by: Tawnya Crook on: 11/11/2019 09:34 AM   Modules accepted: Orders

## 2019-12-01 ENCOUNTER — Other Ambulatory Visit: Payer: Self-pay

## 2019-12-01 ENCOUNTER — Other Ambulatory Visit: Payer: Medicare Other

## 2019-12-01 ENCOUNTER — Ambulatory Visit (INDEPENDENT_AMBULATORY_CARE_PROVIDER_SITE_OTHER)
Admission: RE | Admit: 2019-12-01 | Discharge: 2019-12-01 | Disposition: A | Discharge status: Home / Self Care | Payer: Medicare Other | Source: Ambulatory Visit | Attending: Family Medicine | Admitting: Family Medicine

## 2019-12-01 DIAGNOSIS — U071 COVID-19: Secondary | ICD-10-CM

## 2019-12-03 NOTE — Progress Notes (Signed)
Please inform patient of the following:  Xray shows that his pneumonia is clear but his lungs are still recovering from covid. Do not need to check any further xrays but would like for him to let us know if symptoms do not continue to improved.  David Maynard. Jimmey Ralph, MD 12/03/2019 9:45 AM

## 2019-12-03 NOTE — Progress Notes (Signed)
If not improving then needs referral to pulmonology for lung function testing.  Katina Degree. Jimmey Ralph, MD 12/03/2019 3:32 PM

## 2019-12-04 ENCOUNTER — Telehealth: Payer: Self-pay

## 2019-12-04 DIAGNOSIS — J439 Emphysema, unspecified: Secondary | ICD-10-CM

## 2019-12-04 NOTE — Telephone Encounter (Signed)
See below

## 2019-12-04 NOTE — Telephone Encounter (Signed)
Patient is requesting to get some sort of lung capacity test due to him having covid and have possible scaring.  Please advise?

## 2019-12-07 NOTE — Telephone Encounter (Signed)
Ok to associate with emphysema - there were signs of this on his chest xray.  Katina Degree. Jimmey Ralph, MD 12/07/2019 10:31 AM

## 2019-12-07 NOTE — Addendum Note (Signed)
Addended by: Lieutenant Diego A on: 12/07/2019 10:48 AM   Modules accepted: Orders

## 2019-12-07 NOTE — Telephone Encounter (Signed)
Referral has been placed. 

## 2019-12-07 NOTE — Addendum Note (Signed)
Addended by: Lieutenant Diego A on: 12/07/2019 10:26 AM   Modules accepted: Orders

## 2019-12-07 NOTE — Telephone Encounter (Signed)
Yes. Please see previous messages. Ok with referral to pulm.  Katina Degree. Jimmey Ralph, MD 12/07/2019 8:04 AM

## 2019-12-28 ENCOUNTER — Ambulatory Visit: Payer: Medicare Other | Attending: Internal Medicine

## 2019-12-28 DIAGNOSIS — Z23 Encounter for immunization: Secondary | ICD-10-CM

## 2019-12-28 NOTE — Progress Notes (Signed)
   Covid-19 Vaccination Clinic  Name:  David Maynard    MRN: [id number] DOB: Jun 17, 1950  12/28/2019  [name] was observed post Covid-19 immunization for 15 minutes without incident. He was provided with Vaccine Information Sheet and instruction to access the V-Safe system.   [name] was instructed to call 911 with any severe reactions post vaccine: Marland Kitchen Difficulty breathing  . Swelling of face and throat  . A fast heartbeat  . A bad rash all over body  . Dizziness and weakness   Immunizations Administered    Name Date Dose VIS Date Route   Pfizer COVID-19 Vaccine 12/28/2019  5:06 PM 0.3 mL 11/11/2019 Intramuscular   Manufacturer: [employer], [employer]   Lot: [id number]   NDC: [address]-8

## 2019-12-31 ENCOUNTER — Other Ambulatory Visit: Payer: Self-pay | Admitting: Family Medicine

## 2019-12-31 ENCOUNTER — Other Ambulatory Visit: Payer: Self-pay | Admitting: Endocrinology

## 2020-01-14 ENCOUNTER — Institutional Professional Consult (permissible substitution): Payer: Medicare Other | Admitting: Pulmonary Disease

## 2020-01-29 ENCOUNTER — Encounter: Payer: Self-pay | Admitting: Endocrinology

## 2020-02-01 ENCOUNTER — Encounter: Payer: Medicare Other | Admitting: Family Medicine

## 2020-02-10 ENCOUNTER — Ambulatory Visit (INDEPENDENT_AMBULATORY_CARE_PROVIDER_SITE_OTHER): Payer: Medicare Other | Admitting: Pulmonary Disease

## 2020-02-10 ENCOUNTER — Encounter: Payer: Self-pay | Admitting: Pulmonary Disease

## 2020-02-10 ENCOUNTER — Other Ambulatory Visit: Payer: Self-pay

## 2020-02-10 VITALS — BP 136/74 | HR 83 | Temp 97.6°F | Ht 68.0 in | Wt 144.0 lb

## 2020-02-10 DIAGNOSIS — U099 Post covid-19 condition, unspecified: Secondary | ICD-10-CM | POA: Diagnosis not present

## 2020-02-10 DIAGNOSIS — R0602 Shortness of breath: Secondary | ICD-10-CM | POA: Diagnosis not present

## 2020-02-10 NOTE — Patient Instructions (Signed)
I am glad that your breathing is improving and you are staying active I have reviewed your chest x-ray which may show some scarring due to prior COVID-19 infection We will order high-resolution CT and pulmonary function test for better evaluation of your lung Follow-up in clinic in 1 to 2 months.

## 2020-02-10 NOTE — Progress Notes (Signed)
ADIB WAHBA    [id number]    [birth year]  Primary Care Physician:Parker, Katina Degree, MD  Referring Physician: Ardith Dark, MD [address] [place],  [place] [address]  Chief complaint: Consult for post-COVID  HPI: 70 year old with history of diabetes, hyperlipidemia Hospitalized for COVID-19 in early September.  He got treated with monoclonal antibody but was sent and was admitted to the hospital.  Treated with remdesivir, steroids.  Discharged on supplemental oxygen and was weaned off it in a few weeks as an outpatient  He has made slow recovery.  Still has mild dyspnea on exertion but is able to take up tennis and golf again. 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 Exposures: No mold, hot tub, Jacuzzi.  No feather pillows or comforters Smoking history: Never smoker Travel history: No significant travel history Relevant family history: No family history of lung disease   Outpatient Encounter Medications as of 02/10/2020  Medication Sig  . ascorbic acid (VITAMIN C) 500 MG tablet Take 1 tablet (500 mg total) by mouth daily.  . bromocriptine (PARLODEL) 2.5 MG tablet TAKE 1 TABLET BY MOUTH  DAILY  . Calcium Carbonate (CALCIUM 600 PO) Take 1 tablet by mouth daily.  . Cinnamon 500 MG capsule Take 3,000 mg by mouth 2 (two) times daily.  . clomiPHENE (CLOMID) 50 MG tablet Take 0.5 tablets (25 mg total) by mouth daily.  . colesevelam (WELCHOL) 625 MG tablet TAKE 4 TABLETS BY MOUTH  DAILY  . docusate sodium (COLACE) 100 MG capsule Take 100 mg by mouth 2 (two) times daily.  . Ferrous Sulfate Dried 45 MG TBCR Take 1 tablet by mouth 2 (two) times daily.  . Glucosamine-Chondroit-Vit C-Mn (GLUCOSAMINE CHONDR 1500 COMPLX PO) Take 2 tablets by mouth daily.  Marland Kitchen levothyroxine (SYNTHROID) 75 MCG tablet TAKE 1  TABLET BY MOUTH  DAILY  . liraglutide (VICTOZA) 18 MG/3ML SOPN Inject 0.3 mLs (1.8 mg total) into the skin every morning.  . metFORMIN (GLUCOPHAGE-XR) 500 MG 24 hr tablet Take 2 tablets (1,000 mg total) by mouth 2 (two) times daily.  . mirabegron ER (MYRBETRIQ) 25 MG TB24 tablet Take 50 mg by mouth daily.  . Multiple Vitamin (MULTIVITAMIN) tablet Take 1 tablet by mouth daily.  . nateglinide (STARLIX) 120 MG tablet Take 1 tablet (120 mg total) by mouth 3 (three) times daily with meals.  Marland Kitchen omega-3 acid ethyl esters (LOVAZA) 1 g capsule TAKE 2 CAPSULES BY MOUTH  TWICE DAILY  . omeprazole (PRILOSEC) 40 MG capsule TAKE 1 CAPSULE BY MOUTH  DAILY  . pioglitazone (ACTOS) 45 MG tablet TAKE 1 TABLET BY MOUTH  DAILY  . repaglinide (PRANDIN) 2 MG tablet Take 2 tablets (4 mg total) by mouth 3 (three) times daily before meals.  . simvastatin (ZOCOR) 20 MG tablet TAKE ONE-HALF TABLET BY  MOUTH AT BEDTIME  . tadalafil (ADCIRCA/CIALIS) 20 MG tablet Take 20 mg by mouth daily as needed for erectile dysfunction.  . Trospium Chloride 60 MG CP24   . Turmeric 500 MG CAPS Take 1,500 mg by mouth daily.  Marland Kitchen ACCU-CHEK GUIDE test strip USE TO CHECK BLOOD SUGAR 6  TIMES DAILY  . albuterol (VENTOLIN HFA) 108 (90 Base) MCG/ACT inhaler Inhale 2 puffs into the lungs every 4 (four) hours as needed for wheezing or shortness of breath. (Patient  not taking: Reported on 10/12/2019)  . chlorpheniramine-HYDROcodone (TUSSIONEX) 10-8 MG/5ML SUER Take 5 mLs by mouth every 12 (twelve) hours as needed for cough.  . diphenhydrAMINE (BENADRYL) 25 MG tablet Take 25 mg by mouth every 6 (six) hours as needed.  Marland Kitchen PROLENSA 0.07 % SOLN Place 1 drop into both eyes daily.  Marland Kitchen zinc sulfate 220 (50 Zn) MG capsule Take 1 capsule (220 mg total) by mouth daily. (Patient not taking: Reported on 10/26/2019)   No facility-administered encounter medications on file as of 02/10/2020.    Allergies as of 02/10/2020  . (No Known Allergies)    Past Medical  History:  Diagnosis Date  . Anemia    history of anemia  . 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 Depressive disorder, not elsewhere classified    none  . Diabetes mellitus type II   . Hypercholesterolemia   . Hypogonadism male   . Primary localized osteoarthritis of left knee 12/11/2017  . Primary localized osteoarthritis of right knee 10/23/2017  . 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 Thyroid disease   . Unspecified hypothyroidism     Past Surgical History:  Procedure Laterality Date  . ESOPHAGOGASTRODUODENOSCOPY  06/16/2003  . INGUINAL HERNIA REPAIR     left  . JOINT REPLACEMENT    . KNEE ARTHROSCOPY     Left  . KNEE ARTHROSCOPY W/ ACL RECONSTRUCTION     left  . ROTATOR CUFF REPAIR     Right x2  . Stress Cardiolite  07/15/2000  . TOTAL KNEE ARTHROPLASTY Right 11/04/2017  . TOTAL KNEE ARTHROPLASTY Right 11/04/2017   Procedure: TOTAL KNEE ARTHROPLASTY;  Surgeon: Salvatore Marvel, MD;  Location: [facility] OR;  Service: Orthopedics;  Laterality: Right;  . TOTAL KNEE ARTHROPLASTY Left 12/23/2017  . TOTAL KNEE ARTHROPLASTY Left 12/23/2017   Procedure: TOTAL KNEE ARTHROPLASTY;  Surgeon: Salvatore Marvel, MD;  Location: [facility] OR;  Service: Orthopedics;  Laterality: Left;  Marland Kitchen VASECTOMY  05/2002    Family History  Problem Relation Age of Onset  . Breast cancer Neg Hx   . Celiac disease Neg Hx   . Cirrhosis Neg Hx   . Clotting disorder Neg Hx   . Colitis Neg Hx   . Colon cancer Neg Hx   . Colon polyps Neg Hx   . Crohn's disease Neg Hx   . Cystic fibrosis Neg Hx   . Diabetes Neg Hx   . Esophageal cancer Neg Hx   . Heart disease Neg Hx   . Hemochromatosis Neg Hx   . Inflammatory bowel disease Neg Hx   . Irritable bowel syndrome Neg Hx   . Kidney disease Neg Hx   . Liver cancer Neg Hx   . Liver disease Neg Hx   . Ovarian cancer Neg Hx   . Pancreatic cancer Neg Hx   . Prostate cancer Neg Hx   . Rectal cancer Neg Hx   . Stomach cancer Neg Hx   . Ulcerative colitis Neg Hx   .  Uterine cancer Neg Hx   . Wilson's disease Neg Hx     Social History   Socioeconomic History  . Marital status: Married    Spouse name: Not on file  . Number of children: Not on file  . Years of education: Not on file  . Highest education level: Not on file  Occupational History  . Occupation: Retired  Tobacco Use  . Smoking status: Never Smoker  . Smokeless tobacco: Never Used  Vaping Use  .  Vaping Use: Never used  Substance and Sexual Activity  . Alcohol use: Yes    Alcohol/week: 1.0 standard drink    Types: 1 Cans of beer per week    Comment: weekly  . Drug use: No  . Sexual activity: Not on file  Other Topics Concern  . Not on file  Social History Narrative  . Not on file   Social Determinants of Health   Financial Resource Strain: Low Risk   . Difficulty of Paying Living Expenses: Not hard at all  Food Insecurity: No Food Insecurity  . Worried About Programme researcher, broadcasting/film/videounning Out of Food in the Last Year: Never true  . Ran Out of Food in the Last Year: Never true  Transportation Needs: No Transportation Needs  . Lack of Transportation (Medical): No  . Lack of Transportation (Non-Medical): No  Physical Activity: Insufficiently Active  . Days of Exercise per Week: 5 days  . Minutes of Exercise per Session: 20 min  Stress: No Stress Concern Present  . Feeling of Stress : Not at all  Social Connections: Socially Integrated  . Frequency of Communication with Friends and Family: More than three times a week  . Frequency of Social Gatherings with Friends and Family: More than three times a week  . Attends Religious Services: More than 4 times per year  . Active Member of Clubs or Organizations: Yes  . Attends BankerClub or Organization Meetings: 1 to 4 times per year  . Marital Status: Married  Catering managerntimate Partner Violence: Not At Risk  . Fear of Current or Ex-Partner: No  . Emotionally Abused: No  . Physically Abused: No  . Sexually Abused: No    Review of systems: Review of Systems   Constitutional: Negative for fever and chills.  HENT: Negative.   Eyes: Negative for blurred vision.  Respiratory: as per HPI  Cardiovascular: Negative for chest pain and palpitations.  Gastrointestinal: Negative for vomiting, diarrhea, blood per rectum. Genitourinary: Negative for dysuria, urgency, frequency and hematuria.  Musculoskeletal: Negative for myalgias, back pain and joint pain.  Skin: Negative for itching and rash.  Neurological: Negative for dizziness, tremors, focal weakness, seizures and loss of consciousness.  Endo/Heme/Allergies: Negative for environmental allergies.  Psychiatric/Behavioral: Negative for depression, suicidal ideas and hallucinations.  All other systems reviewed and are negative.  Physical Exam: Blood pressure 136/74, pulse 83, temperature 97.6 F (36.4 C), temperature source Skin, height 5\' 8"  (1.727 m), weight 144 lb (65.3 kg), SpO2 99 %. Gen:      No acute distress HEENT:  EOMI, sclera anicteric Neck:     No masses; no thyromegaly Lungs:    Clear to auscultation bilaterally; normal respiratory effort  CV:         Regular rate and rhythm; no murmurs Abd:      + bowel sounds; soft, non-tender; no palpable masses, no distension Ext:    No edema; adequate peripheral perfusion Skin:      Warm and dry; no rash Neuro: alert and oriented x 3 Psych: normal mood and affect  Data Reviewed: Imaging: CTA 09/27/2019-no PE, extensive bilateral groundglass airspace disease.  Chest x-ray 12/01/2019- coarsened interstitial markings, hyperinflation.  I have reviewed the images personally.  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  Chest x-ray also shows some hyperinflation but he is a non-smoker.  We will review PFTs for any obstructive changes.  Plan/Recommendations: High-resolution CT, PFTs  Chilton Greathouse MD [facility] 02/10/2020, 10:12 AM  CC: Ardith Dark, MD

## 2020-02-15 ENCOUNTER — Other Ambulatory Visit: Payer: Self-pay

## 2020-02-15 ENCOUNTER — Ambulatory Visit (INDEPENDENT_AMBULATORY_CARE_PROVIDER_SITE_OTHER): Payer: Medicare Other | Admitting: Endocrinology

## 2020-02-15 ENCOUNTER — Telehealth: Payer: Self-pay | Admitting: Endocrinology

## 2020-02-15 VITALS — BP 124/60 | HR 82 | Ht 68.0 in | Wt 143.0 lb

## 2020-02-15 DIAGNOSIS — E119 Type 2 diabetes mellitus without complications: Secondary | ICD-10-CM | POA: Diagnosis not present

## 2020-02-15 LAB — POCT GLYCOSYLATED HEMOGLOBIN (HGB A1C): Hemoglobin A1C: 6.4 % — AB (ref 4.0–5.6)

## 2020-02-15 MED ORDER — INSULIN LISPRO (1 UNIT DIAL) 100 UNIT/ML (KWIKPEN)
2.0000 [IU] | PEN_INJECTOR | Freq: Every day | SUBCUTANEOUS | 11 refills | Status: DC
Start: 2020-02-15 — End: 2020-03-11

## 2020-02-15 MED ORDER — DEXCOM G6 TRANSMITTER MISC: 1.0000 | Freq: Once | 1 refills | Status: AC

## 2020-02-15 MED ORDER — DEXCOM G6 SENSOR MISC: 1.0000 | 3 refills | Status: DC

## 2020-02-15 MED ORDER — DEXCOM G6 RECEIVER DEVI: 1.0000 | Freq: Once | 1 refills | Status: AC

## 2020-02-15 NOTE — Patient Instructions (Signed)
check your blood sugar 4 times a day: before the 3 meals, and at bedtime.  also check if you have symptoms of your blood sugar being too high or too low.  please keep a record of the readings and bring it to your next appointment here (or you can bring the meter itself).  You can write it on any piece of paper.  please call us sooner if your blood sugar goes below 70, or if you have a lot of readings over 200.  Try taking both repaglinide and nateglinide with supper.   If this doesn't help, take 2-3 units of Humalog with supper Please continue the same other diabetes medications.   Please come back for a follow-up appointment in 3 months.

## 2020-02-15 NOTE — Telephone Encounter (Signed)
Pharmacy called asking for someone to give them a call back for clarification on patient's Humalog.  Ph# [phone]

## 2020-02-15 NOTE — Progress Notes (Signed)
Subjective:    Patient ID: David Maynard, male    DOB: 1950-08-27, 70 y.o.   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   Dx'ed: 2008 Complications: none  Therapy: Victoza + 5 oral meds.   DKA: never.  Severe hypoglycemia: never.  Pancreatitis: never.  Other: he has never taken insulin, except with steroids Interval history: He took insulin until the taper was done.  pt states he feels much better in general.  He takes repaglinide with meals, and nateglinide with snacks. He sends via my chart message, a record of his cbg's which I have reviewed today.  cbg varies from 73-198.  It is in general highest at HS. Past Medical History:  Diagnosis Date  . Anemia    history of anemia  . 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 Depressive disorder, not elsewhere classified    none  . Diabetes mellitus type II   . Hypercholesterolemia   . Hypogonadism male   . Primary localized osteoarthritis of left knee 12/11/2017  . Primary localized osteoarthritis of right knee 10/23/2017  . 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 Thyroid disease   . Unspecified hypothyroidism     Past Surgical History:  Procedure Laterality Date  . ESOPHAGOGASTRODUODENOSCOPY  06/16/2003  . INGUINAL HERNIA REPAIR     left  . JOINT REPLACEMENT    . KNEE ARTHROSCOPY     Left  . KNEE ARTHROSCOPY W/ ACL RECONSTRUCTION     left  . ROTATOR CUFF REPAIR     Right x2  . Stress Cardiolite  07/15/2000  . TOTAL KNEE ARTHROPLASTY Right 11/04/2017  . TOTAL KNEE ARTHROPLASTY Right 11/04/2017   Procedure: TOTAL KNEE ARTHROPLASTY;  Surgeon: Salvatore Marvel, MD;  Location: [facility] OR;  Service: Orthopedics;  Laterality: Right;  . TOTAL KNEE ARTHROPLASTY Left 12/23/2017  . TOTAL KNEE ARTHROPLASTY Left 12/23/2017   Procedure: TOTAL KNEE ARTHROPLASTY;  Surgeon: Salvatore Marvel, MD;  Location: [facility] OR;  Service:  Orthopedics;  Laterality: Left;  Marland Kitchen VASECTOMY  05/2002    Social History   Socioeconomic History  . Marital status: Married    Spouse name: Not on file  . Number of children: Not on file  . Years of education: Not on file  . Highest education level: Not on file  Occupational History  . Occupation: Retired  Tobacco Use  . Smoking status: Never Smoker  . Smokeless tobacco: Never Used  Vaping Use  . Vaping Use: Never used  Substance and Sexual Activity  . Alcohol use: Yes    Alcohol/week: 1.0 standard drink    Types: 1 Cans of beer per week    Comment: weekly  . Drug use: No  . Sexual activity: Not on file  Other Topics Concern  . Not on file  Social History Narrative  . Not on file   Social Determinants of Health   Financial Resource Strain: Low Risk   . Difficulty of Paying Living Expenses: Not hard at all  Food Insecurity: No Food Insecurity  . Worried About Programme researcher, broadcasting/film/video in the Last Year: Never true  . Ran Out of Food in the Last Year: Never true  Transportation Needs: No Transportation Needs  . Lack of Transportation (Medical): No  . Lack of Transportation (Non-Medical): No  Physical Activity: Insufficiently Active  . Days of  Exercise per Week: 5 days  . Minutes of Exercise per Session: 20 min  Stress: No Stress Concern Present  . Feeling of Stress : Not at all  Social Connections: Socially Integrated  . Frequency of Communication with Friends and Family: More than three times a week  . Frequency of Social Gatherings with Friends and Family: More than three times a week  . Attends Religious Services: More than 4 times per year  . Active Member of Clubs or Organizations: Yes  . Attends BankerClub or Organization Meetings: 1 to 4 times per year  . Marital Status: Married  Catering managerntimate Partner Violence: Not At Risk  . Fear of Current or Ex-Partner: No  . Emotionally Abused: No  . Physically Abused: No  . Sexually Abused: No    Current Outpatient Medications on  File Prior to Visit  Medication Sig Dispense Refill  . ACCU-CHEK GUIDE test strip USE TO CHECK BLOOD SUGAR 6  TIMES DAILY 600 strip 3  . ascorbic acid (VITAMIN C) 500 MG tablet Take 1 tablet (500 mg total) by mouth daily. 20 tablet 0  . bromocriptine (PARLODEL) 2.5 MG tablet TAKE 1 TABLET BY MOUTH  DAILY 90 tablet 3  . Calcium Carbonate (CALCIUM 600 PO) Take 1 tablet by mouth daily.    . Cinnamon 500 MG capsule Take 3,000 mg by mouth 2 (two) times daily.    . clomiPHENE (CLOMID) 50 MG tablet Take 0.5 tablets (25 mg total) by mouth daily. 45 tablet 3  . colesevelam (WELCHOL) 625 MG tablet TAKE 4 TABLETS BY MOUTH  DAILY 360 tablet 3  . docusate sodium (COLACE) 100 MG capsule Take 100 mg by mouth 2 (two) times daily.    . Ferrous Sulfate Dried 45 MG TBCR Take 1 tablet by mouth 2 (two) times daily.    . Glucosamine-Chondroit-Vit C-Mn (GLUCOSAMINE CHONDR 1500 COMPLX PO) Take 2 tablets by mouth daily.    Marland Kitchen. levothyroxine (SYNTHROID) 75 MCG tablet TAKE 1 TABLET BY MOUTH  DAILY 90 tablet 3  . liraglutide (VICTOZA) 18 MG/3ML SOPN Inject 0.3 mLs (1.8 mg total) into the skin every morning. 27 mL 3  . metFORMIN (GLUCOPHAGE-XR) 500 MG 24 hr tablet Take 2 tablets (1,000 mg total) by mouth 2 (two) times daily. 20 tablet 0  . mirabegron ER (MYRBETRIQ) 25 MG TB24 tablet Take 50 mg by mouth daily.    . Multiple Vitamin (MULTIVITAMIN) tablet Take 1 tablet by mouth daily.    . nateglinide (STARLIX) 120 MG tablet Take 1 tablet (120 mg total) by mouth 3 (three) times daily with meals. 270 tablet 3  . omega-3 acid ethyl esters (LOVAZA) 1 g capsule TAKE 2 CAPSULES BY MOUTH  TWICE DAILY 360 capsule 3  . omeprazole (PRILOSEC) 40 MG capsule TAKE 1 CAPSULE BY MOUTH  DAILY 90 capsule 3  . pioglitazone (ACTOS) 45 MG tablet TAKE 1 TABLET BY MOUTH  DAILY 90 tablet 3  . repaglinide (PRANDIN) 2 MG tablet Take 2 tablets (4 mg total) by mouth 3 (three) times daily before meals. 540 tablet 3  . simvastatin (ZOCOR) 20 MG tablet TAKE  ONE-HALF TABLET BY  MOUTH AT BEDTIME 45 tablet 3  . tadalafil (ADCIRCA/CIALIS) 20 MG tablet Take 20 mg by mouth daily as needed for erectile dysfunction.    . Trospium Chloride 60 MG CP24     . Turmeric 500 MG CAPS Take 1,500 mg by mouth daily.    Marland Kitchen. albuterol (VENTOLIN HFA) 108 (90 Base) MCG/ACT inhaler Inhale 2  puffs into the lungs every 4 (four) hours as needed for wheezing or shortness of breath. (Patient not taking: Reported on 10/12/2019) 1 each 1  . chlorpheniramine-HYDROcodone (TUSSIONEX) 10-8 MG/5ML SUER Take 5 mLs by mouth every 12 (twelve) hours as needed for cough. 140 mL 0  . diphenhydrAMINE (BENADRYL) 25 MG tablet Take 25 mg by mouth every 6 (six) hours as needed.    Marland Kitchen PROLENSA 0.07 % SOLN Place 1 drop into both eyes daily.    Marland Kitchen zinc sulfate 220 (50 Zn) MG capsule Take 1 capsule (220 mg total) by mouth daily. (Patient not taking: Reported on 10/26/2019) 20 capsule 0   No current facility-administered medications on file prior to visit.    No Known Allergies  Family History  Problem Relation Age of Onset  . Breast cancer Neg Hx   . Celiac disease Neg Hx   . Cirrhosis Neg Hx   . Clotting disorder Neg Hx   . Colitis Neg Hx   . Colon cancer Neg Hx   . Colon polyps Neg Hx   . Crohn's disease Neg Hx   . Cystic fibrosis Neg Hx   . Diabetes Neg Hx   . Esophageal cancer Neg Hx   . Heart disease Neg Hx   . Hemochromatosis Neg Hx   . Inflammatory bowel disease Neg Hx   . Irritable bowel syndrome Neg Hx   . Kidney disease Neg Hx   . Liver cancer Neg Hx   . Liver disease Neg Hx   . Ovarian cancer Neg Hx   . Pancreatic cancer Neg Hx   . Prostate cancer Neg Hx   . Rectal cancer Neg Hx   . Stomach cancer Neg Hx   . Ulcerative colitis Neg Hx   . Uterine cancer Neg Hx   . Wilson's disease Neg Hx     BP 124/60 (BP Location: Right Arm, Patient Position: Sitting, Cuff Size: Normal)   Pulse 82   Ht 5\' 8"  (1.727 m)   Wt 143 lb (64.9 kg)   SpO2 96%   BMI 21.74 kg/m    Review  of Systems     Objective:   Physical Exam VITAL SIGNS:  See vs page GENERAL: no distress Pulses: dorsalis pedis intact bilat.   MSK: no deformity of the feet CV: no leg edema Skin:  no ulcer on the feet.  normal color and temp on the feet.   Neuro: sensation is intact to touch on the feet.    Lab Results  Component Value Date   HGBA1C 6.4 (A) 02/15/2020   Lab Results  Component Value Date   CREATININE 0.95 10/01/2019   BUN 30 (H) 10/01/2019   NA 131 (L) 10/01/2019   K 3.8 10/01/2019   CL 99 10/01/2019   CO2 21 (L) 10/01/2019       Assessment & Plan:  Type 2 DM: uncontrolled, as goal A1c is <6.0%  Patient Instructions  check your blood sugar 4 times a day: before the 3 meals, and at bedtime.  also check if you have symptoms of your blood sugar being too high or too low.  please keep a record of the readings and bring it to your next appointment here (or you can bring the meter itself).  You can write it on any piece of paper.  please call 12/01/2019 sooner if your blood sugar goes below 70, or if you have a lot of readings over 200.  Try taking both repaglinide and nateglinide with  supper.   If this doesn't help, take 2-3 units of Humalog with supper Please continue the same other diabetes medications.   Please come back for a follow-up appointment in 3 months.

## 2020-02-16 ENCOUNTER — Encounter: Payer: Self-pay | Admitting: Endocrinology

## 2020-02-16 NOTE — Telephone Encounter (Signed)
LVM--Clarified to [facility] Humalog 2-3 units of Humalog with supper by last note Dr. Everardo All

## 2020-02-22 ENCOUNTER — Other Ambulatory Visit: Payer: Self-pay | Admitting: Endocrinology

## 2020-02-22 ENCOUNTER — Encounter: Payer: Self-pay | Admitting: Family Medicine

## 2020-02-23 ENCOUNTER — Encounter: Payer: Self-pay | Admitting: Family Medicine

## 2020-02-23 ENCOUNTER — Other Ambulatory Visit: Payer: Self-pay | Admitting: Family Medicine

## 2020-02-23 ENCOUNTER — Ambulatory Visit (INDEPENDENT_AMBULATORY_CARE_PROVIDER_SITE_OTHER): Payer: Medicare Other | Admitting: Family Medicine

## 2020-02-23 ENCOUNTER — Other Ambulatory Visit: Payer: Self-pay

## 2020-02-23 ENCOUNTER — Telehealth: Payer: Self-pay | Admitting: Endocrinology

## 2020-02-23 VITALS — BP 112/65 | HR 72 | Temp 98.2°F | Ht 68.0 in | Wt 141.6 lb

## 2020-02-23 DIAGNOSIS — E119 Type 2 diabetes mellitus without complications: Secondary | ICD-10-CM | POA: Diagnosis not present

## 2020-02-23 DIAGNOSIS — E1169 Type 2 diabetes mellitus with other specified complication: Secondary | ICD-10-CM | POA: Diagnosis not present

## 2020-02-23 DIAGNOSIS — E785 Hyperlipidemia, unspecified: Secondary | ICD-10-CM

## 2020-02-23 DIAGNOSIS — Z0001 Encounter for general adult medical examination with abnormal findings: Secondary | ICD-10-CM | POA: Diagnosis not present

## 2020-02-23 DIAGNOSIS — Z125 Encounter for screening for malignant neoplasm of prostate: Secondary | ICD-10-CM | POA: Diagnosis not present

## 2020-02-23 DIAGNOSIS — K219 Gastro-esophageal reflux disease without esophagitis: Secondary | ICD-10-CM

## 2020-02-23 DIAGNOSIS — E039 Hypothyroidism, unspecified: Secondary | ICD-10-CM | POA: Diagnosis not present

## 2020-02-23 DIAGNOSIS — K429 Umbilical hernia without obstruction or gangrene: Secondary | ICD-10-CM | POA: Insufficient documentation

## 2020-02-23 LAB — COMPREHENSIVE METABOLIC PANEL
ALT: 28 U/L (ref 0–53)
AST: 29 U/L (ref 0–37)
Albumin: 4.2 g/dL (ref 3.5–5.2)
Alkaline Phosphatase: 52 U/L (ref 39–117)
BUN: 33 mg/dL — ABNORMAL HIGH (ref 6–23)
CO2: 28 mEq/L (ref 19–32)
Calcium: 9.6 mg/dL (ref 8.4–10.5)
Chloride: 98 mEq/L (ref 96–112)
Creatinine, Ser: 0.93 mg/dL (ref 0.40–1.50)
GFR: 83.71 mL/min (ref >60.00)
Glucose, Bld: 149 mg/dL — ABNORMAL HIGH (ref 70–99)
Potassium: 4.4 mEq/L (ref 3.5–5.1)
Sodium: 132 mEq/L — ABNORMAL LOW (ref 135–145)
Total Bilirubin: 0.4 mg/dL (ref 0.2–1.2)
Total Protein: 6.3 g/dL (ref 6.0–8.3)

## 2020-02-23 LAB — CBC
HCT: 39.5 % (ref 39.0–52.0)
Hemoglobin: 13.5 g/dL (ref 13.0–17.0)
MCHC: 34.2 g/dL (ref 30.0–36.0)
MCV: 88.7 fl (ref 78.0–100.0)
Platelets: 177 10*3/uL (ref 150.0–400.0)
RBC: 4.45 Mil/uL (ref 4.22–5.81)
RDW: 13.7 % (ref 11.5–15.5)
WBC: 7.6 10*3/uL (ref 4.0–10.5)

## 2020-02-23 LAB — LIPID PANEL
Cholesterol: 149 mg/dL (ref 0–200)
HDL: 77.4 mg/dL (ref >39.00)
LDL Cholesterol: 61 mg/dL (ref 0–99)
NonHDL: 71.97
Total CHOL/HDL Ratio: 2
Triglycerides: 55 mg/dL (ref 0.0–149.0)
VLDL: 11 mg/dL (ref 0.0–40.0)

## 2020-02-23 LAB — TSH: TSH: 2.74 u[IU]/mL (ref 0.35–4.50)

## 2020-02-23 LAB — PSA: PSA: 3.78 ng/mL (ref 0.10–4.00)

## 2020-02-23 MED ORDER — AMOXICILLIN 500 MG PO CAPS: ORAL_CAPSULE | ORAL | 0 refills | Status: DC

## 2020-02-23 NOTE — Progress Notes (Signed)
Chief Complaint:  David Maynard is a 70 y.o. male who presents today for his annual comprehensive physical exam.    Assessment/Plan:  Chronic Problems Addressed Today: Dyslipidemia associated with type 2 diabetes mellitus (HCC) Check lipids.  Continue simvastatin 20 mg daily.  Hypothyroidism On Synthroid per endocrinology.  Umbilical hernia without obstruction and without gangrene Easily reducible.  No red flags.  Given the symptoms are manageable will defer surgical evaluation at this point.  Discussed reasons to return  Gastroesophageal reflux disease Doing well with omeprazole as needed.  Diabetes mellitus, type II (HCC) Managed by endocrinology.  Preventative Healthcare: Check labs.  Up-to-date on vaccines and screenings.  Patient Counseling(The following topics were reviewed and/or handout was given):  -Nutrition: Stressed importance of moderation in sodium/caffeine intake, saturated fat and cholesterol, caloric balance, sufficient intake of fresh fruits, vegetables, and fiber.  -Stressed the importance of regular exercise.   -Substance Abuse: Discussed cessation/primary prevention of tobacco, alcohol, or other drug use; driving or other dangerous activities under the influence; availability of treatment for abuse.   -Injury prevention: Discussed safety belts, safety helmets, smoke detector, smoking near bedding or upholstery.   -Sexuality: Discussed sexually transmitted diseases, partner selection, use of condoms, avoidance of unintended pregnancy and contraceptive alternatives.   -Dental health: Discussed importance of regular tooth brushing, flossing, and dental visits.  -Health maintenance and immunizations reviewed. Please refer to Health maintenance section.  Return to care in 1 year for next preventative visit.     Subjective:  HPI:  He has no acute complaints today.  See A/P for status of chronic conditions.  Also had umbilical hernia that is stable.  Minimal  pain.  Lifestyle Diet: Balanced.  Exercise: Likes tennis and golf  Depression screen PHQ 2/9 09/03/2019  Decreased Interest 0  Down, Depressed, Hopeless 0  PHQ - 2 Score 0  Some recent data might be hidden    Health Maintenance Due  Topic Date Due  . URINE MICROALBUMIN  04/15/2019     ROS: Per HPI, otherwise a complete review of systems was negative.   PMH:  The following were reviewed and entered/updated in epic: Past Medical History:  Diagnosis Date  . Anemia    history of anemia  . 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 Depressive disorder, not elsewhere classified    none  . Diabetes mellitus type II   . Hypercholesterolemia   . Hypogonadism male   . Primary localized osteoarthritis of left knee 12/11/2017  . Primary localized osteoarthritis of right knee 10/23/2017  . 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 Thyroid disease   . Unspecified hypothyroidism    Patient Active Problem List   Diagnosis Date Noted  . Umbilical hernia without obstruction and without gangrene 02/23/2020  . Gastroesophageal reflux disease 01/13/2019  . BPH  01/08/2018  . ED (erectile dysfunction) 01/08/2018  . OAB (overactive bladder) 01/08/2018  . History of bilateral knee replacement 12/11/2017  . Lumbar disc disease 09/22/2010  . Diabetes mellitus, type II (HCC)   . Hypogonadism male 06/19/2007  . Dyslipidemia associated with type 2 diabetes mellitus (HCC) 06/19/2007  . Hypothyroidism 03/05/2007   Past Surgical History:  Procedure Laterality Date  . ESOPHAGOGASTRODUODENOSCOPY  06/16/2003  . INGUINAL HERNIA REPAIR     left  . JOINT REPLACEMENT    . KNEE ARTHROSCOPY     Left  . KNEE ARTHROSCOPY W/ ACL RECONSTRUCTION     left  . ROTATOR CUFF REPAIR  Right x2  . Stress Cardiolite  07/15/2000  . TOTAL KNEE ARTHROPLASTY Right 11/04/2017  . TOTAL KNEE ARTHROPLASTY Right 11/04/2017   Procedure: TOTAL KNEE ARTHROPLASTY;  Surgeon: Salvatore Marvel, MD;  Location: [facility] OR;   Service: Orthopedics;  Laterality: Right;  . TOTAL KNEE ARTHROPLASTY Left 12/23/2017  . TOTAL KNEE ARTHROPLASTY Left 12/23/2017   Procedure: TOTAL KNEE ARTHROPLASTY;  Surgeon: Salvatore Marvel, MD;  Location: [facility] OR;  Service: Orthopedics;  Laterality: Left;  Marland Kitchen VASECTOMY  05/2002    Family History  Problem Relation Age of Onset  . Breast cancer Neg Hx   . Celiac disease Neg Hx   . Cirrhosis Neg Hx   . Clotting disorder Neg Hx   . Colitis Neg Hx   . Colon cancer Neg Hx   . Colon polyps Neg Hx   . Crohn's disease Neg Hx   . Cystic fibrosis Neg Hx   . Diabetes Neg Hx   . Esophageal cancer Neg Hx   . Heart disease Neg Hx   . Hemochromatosis Neg Hx   . Inflammatory bowel disease Neg Hx   . Irritable bowel syndrome Neg Hx   . Kidney disease Neg Hx   . Liver cancer Neg Hx   . Liver disease Neg Hx   . Ovarian cancer Neg Hx   . Pancreatic cancer Neg Hx   . Prostate cancer Neg Hx   . Rectal cancer Neg Hx   . Stomach cancer Neg Hx   . Ulcerative colitis Neg Hx   . Uterine cancer Neg Hx   . Wilson's disease Neg Hx     Medications- reviewed and updated Current Outpatient Medications  Medication Sig Dispense Refill  . ACCU-CHEK GUIDE test strip USE TO CHECK BLOOD SUGAR 6  TIMES DAILY 600 strip 3  . amoxicillin (AMOXIL) 500 MG capsule Take 4 tablets by mouth 1 hour before your dental procedure. 32 capsule 0  . ascorbic acid (VITAMIN C) 500 MG tablet Take 1 tablet (500 mg total) by mouth daily. 20 tablet 0  . bromocriptine (PARLODEL) 2.5 MG tablet TAKE 1 TABLET BY MOUTH  DAILY 90 tablet 3  . Calcium Carbonate (CALCIUM 600 PO) Take 1 tablet by mouth daily.    . Cinnamon 500 MG capsule Take 3,000 mg by mouth 2 (two) times daily.    . clomiPHENE (CLOMID) 50 MG tablet Take 0.5 tablets (25 mg total) by mouth daily. 45 tablet 3  . colesevelam (WELCHOL) 625 MG tablet TAKE 4 TABLETS BY MOUTH  DAILY 360 tablet 3  . Continuous Blood Gluc Sensor (DEXCOM G6 SENSOR) MISC 1 Device by Does not apply  route See admin instructions. 1 every 10 days 9 each 3  . docusate sodium (COLACE) 100 MG capsule Take 100 mg by mouth 2 (two) times daily.    . Ferrous Sulfate Dried 45 MG TBCR Take 1 tablet by mouth 2 (two) times daily.    . Glucosamine-Chondroit-Vit C-Mn (GLUCOSAMINE CHONDR 1500 COMPLX PO) Take 2 tablets by mouth daily.    . insulin lispro (HUMALOG KWIKPEN) 100 UNIT/ML KwikPen Inject 2-4 Units into the skin daily with supper. And pen needles 1/day 15 mL 11  . levothyroxine (SYNTHROID) 75 MCG tablet TAKE 1 TABLET BY MOUTH  DAILY 90 tablet 3  . liraglutide (VICTOZA) 18 MG/3ML SOPN Inject 0.3 mLs (1.8 mg total) into the skin every morning. 27 mL 3  . metFORMIN (GLUCOPHAGE-XR) 500 MG 24 hr tablet Take 2 tablets (1,000 mg total) by mouth 2 (two)  times daily. 20 tablet 0  . mirabegron ER (MYRBETRIQ) 25 MG TB24 tablet Take 50 mg by mouth daily.    . Multiple Vitamin (MULTIVITAMIN) tablet Take 1 tablet by mouth daily.    . nateglinide (STARLIX) 120 MG tablet Take 1 tablet (120 mg total) by mouth 3 (three) times daily with meals. 270 tablet 3  . omega-3 acid ethyl esters (LOVAZA) 1 g capsule TAKE 2 CAPSULES BY MOUTH  TWICE DAILY 360 capsule 3  . omeprazole (PRILOSEC) 40 MG capsule TAKE 1 CAPSULE BY MOUTH  DAILY 90 capsule 3  . repaglinide (PRANDIN) 2 MG tablet Take 2 tablets (4 mg total) by mouth 3 (three) times daily before meals. 540 tablet 3  . simvastatin (ZOCOR) 20 MG tablet TAKE ONE-HALF TABLET BY  MOUTH AT BEDTIME 45 tablet 3  . tadalafil (ADCIRCA/CIALIS) 20 MG tablet Take 20 mg by mouth daily as needed for erectile dysfunction.    . Trospium Chloride 60 MG CP24     . Turmeric 500 MG CAPS Take 1,500 mg by mouth daily.     No current facility-administered medications for this visit.    Allergies-reviewed and updated No Known Allergies  Social History   Socioeconomic History  . Marital status: Married    Spouse name: Not on file  . Number of children: Not on file  . Years of education:  Not on file  . Highest education level: Not on file  Occupational History  . Occupation: Retired  Tobacco Use  . Smoking status: Never Smoker  . Smokeless tobacco: Never Used  Vaping Use  . Vaping Use: Never used  Substance and Sexual Activity  . Alcohol use: Yes    Alcohol/week: 1.0 standard drink    Types: 1 Cans of beer per week    Comment: weekly  . Drug use: No  . Sexual activity: Not on file  Other Topics Concern  . Not on file  Social History Narrative  . Not on file   Social Determinants of Health   Financial Resource Strain: Low Risk   . Difficulty of Paying Living Expenses: Not hard at all  Food Insecurity: No Food Insecurity  . Worried About Programme researcher, broadcasting/film/video in the Last Year: Never true  . Ran Out of Food in the Last Year: Never true  Transportation Needs: No Transportation Needs  . Lack of Transportation (Medical): No  . Lack of Transportation (Non-Medical): No  Physical Activity: Insufficiently Active  . Days of Exercise per Week: 5 days  . Minutes of Exercise per Session: 20 min  Stress: No Stress Concern Present  . Feeling of Stress : Not at all  Social Connections: Socially Integrated  . Frequency of Communication with Friends and Family: More than three times a week  . Frequency of Social Gatherings with Friends and Family: More than three times a week  . Attends Religious Services: More than 4 times per year  . Active Member of Clubs or Organizations: Yes  . Attends Banker Meetings: 1 to 4 times per year  . Marital Status: Married        Objective:  Physical Exam: BP 112/65   Pulse 72   Temp 98.2 F (36.8 C) (Temporal)   Ht 5\' 8"  (1.727 m)   Wt 141 lb 9.6 oz (64.2 kg)   SpO2 98%   BMI 21.53 kg/m   Body mass index is 21.53 kg/m. Wt Readings from Last 3 Encounters:  02/23/20 141 lb 9.6 oz (64.2  kg)  02/15/20 143 lb (64.9 kg)  02/10/20 144 lb (65.3 kg)   Gen: NAD, resting comfortably HEENT: TMs normal bilaterally. OP  clear. No thyromegaly noted.  CV: RRR with no murmurs appreciated Pulm: NWOB, CTAB with very slight bibasilar crackles GI: Normal bowel sounds present. Soft, Nontender, Nondistended.  Easily reducible umbilical hernia MSK: no edema, cyanosis, or clubbing noted Skin: warm, dry Neuro: CN2-12 grossly intact. Strength 5/5 in upper and lower extremities. Reflexes symmetric and intact bilaterally.  Psych: Normal affect and thought content     Kealey Kemmer M. Jimmey Ralph, MD 02/23/2020 10:39 AM

## 2020-02-23 NOTE — Assessment & Plan Note (Signed)
Doing well with omeprazole as needed.

## 2020-02-23 NOTE — Assessment & Plan Note (Signed)
Easily reducible.  No red flags.  Given the symptoms are manageable will defer surgical evaluation at this point.  Discussed reasons to return

## 2020-02-23 NOTE — Telephone Encounter (Signed)
Patient called and requested that the [facility] G6 sensors and receivers should be sent to [facility]  Patient call back number is [phone] - patient requests to be notified when sent to [facility]

## 2020-02-23 NOTE — Assessment & Plan Note (Signed)
Check lipids.  Continue simvastatin 20 mg daily. ?

## 2020-02-23 NOTE — Patient Instructions (Signed)
It was very nice to see you today!  We will check blood work today.  Please let me know if your umbilical hernia becomes more problematic and I can place a referral for you to see a surgeon.  I will refill your amoxicillin today.  I will see back in 1 year for your next annual checkup.  Please come back to see me sooner if needed.  Take care, Dr Jimmey Ralph  Please try these tips to maintain a healthy lifestyle:   Eat at least 3 REAL meals and 1-2 snacks per day.  Aim for no more than 5 hours between eating.  If you eat breakfast, please do so within one hour of getting up.    Each meal should contain half fruits/vegetables, one quarter protein, and one quarter carbs (no bigger than a computer mouse)   Cut down on sweet beverages. This includes juice, soda, and sweet tea.     Drink at least 1 glass of water with each meal and aim for at least 8 glasses per day   Exercise at least 150 minutes every week.    Preventive Care 70 Years and Older, Male Preventive care refers to lifestyle choices and visits with your health care provider that can promote health and wellness. This includes:  A yearly physical exam. This is also called an annual wellness visit.  Regular dental and eye exams.  Immunizations.  Screening for certain conditions.  Healthy lifestyle choices, such as: ? Eating a healthy diet. ? Getting regular exercise. ? Not using drugs or products that contain nicotine and tobacco. ? Limiting alcohol use. What can I expect for my preventive care visit? Physical exam Your health care provider will check your:  Height and weight. These may be used to calculate your BMI (body mass index). BMI is a measurement that tells if you are at a healthy weight.  Heart rate and blood pressure.  Body temperature.  Skin for abnormal spots. Counseling Your health care provider may ask you questions about your:  Past medical problems.  Family's medical history.  Alcohol,  tobacco, and drug use.  Emotional well-being.  Home life and relationship well-being.  Sexual activity.  Diet, exercise, and sleep habits.  History of falls.  Memory and ability to understand (cognition).  Work and work Astronomer.  Access to firearms. What immunizations do I need? Vaccines are usually given at various ages, according to a schedule. Your health care provider will recommend vaccines for you based on your age, medical history, and lifestyle or other factors, such as travel or where you work.   What tests do I need? Blood tests  Lipid and cholesterol levels. These may be checked every 5 years, or more often depending on your overall health.  Hepatitis C test.  Hepatitis B test. Screening  Lung cancer screening. You may have this screening every year starting at age 41 if you have a 30-pack-year history of smoking and currently smoke or have quit within the past 15 years.  Colorectal cancer screening. ? All adults should have this screening starting at age 49 and continuing until age 53. ? Your health care provider may recommend screening at age 21 if you are at increased risk. ? You will have tests every 1-10 years, depending on your results and the type of screening test.  Prostate cancer screening. Recommendations will vary depending on your family history and other risks.  Genital exam to check for testicular cancer or hernias.  Diabetes screening. ? This  is done by checking your blood sugar (glucose) after you have not eaten for a while (fasting). ? You may have this done every 1-3 years.  Abdominal aortic aneurysm (AAA) screening. You may need this if you are a current or former smoker.  STD (sexually transmitted disease) testing, if you are at risk. Follow these instructions at home: Eating and drinking  Eat a diet that includes fresh fruits and vegetables, whole grains, lean protein, and low-fat dairy products. Limit your intake of foods with  high amounts of sugar, saturated fats, and salt.  Take vitamin and mineral supplements as recommended by your health care provider.  Do not drink alcohol if your health care provider tells you not to drink.  If you drink alcohol: ? Limit how much you have to 0-2 drinks a day. ? Be aware of how much alcohol is in your drink. In the [place] one drink equals one 12 oz bottle of beer (355 mL), one 5 oz glass of wine (148 mL), or one 1 oz glass of hard liquor (44 mL).   Lifestyle  Take daily care of your teeth and gums. Brush your teeth every morning and night with fluoride toothpaste. Floss one time each day.  Stay active. Exercise for at least 30 minutes 5 or more days each week.  Do not use any products that contain nicotine or tobacco, such as cigarettes, e-cigarettes, and chewing tobacco. If you need help quitting, ask your health care provider.  Do not use drugs.  If you are sexually active, practice safe sex. Use a condom or other form of protection to prevent STIs (sexually transmitted infections).  Talk with your health care provider about taking a low-dose aspirin or statin.  Find healthy ways to cope with stress, such as: ? Meditation, yoga, or listening to music. ? Journaling. ? Talking to a trusted person. ? Spending time with friends and family. Safety  Always wear your seat belt while driving or riding in a vehicle.  Do not drive: ? If you have been drinking alcohol. Do not ride with someone who has been drinking. ? When you are tired or distracted. ? While texting.  Wear a helmet and other protective equipment during sports activities.  If you have firearms in your house, make sure you follow all gun safety procedures. What's next?  Visit your health care provider once a year for an annual wellness visit.  Ask your health care provider how often you should have your eyes and teeth checked.  Stay up to date on all vaccines. This information is not intended to  replace advice given to you by your health care provider. Make sure you discuss any questions you have with your health care provider. Document Revised: 10/07/2018 Document Reviewed: 01/02/2018 Elsevier Patient Education  2021 [employer].

## 2020-02-23 NOTE — Assessment & Plan Note (Signed)
Managed by endocrinology.

## 2020-02-23 NOTE — Assessment & Plan Note (Signed)
On Synthroid per endocrinology.

## 2020-02-24 ENCOUNTER — Other Ambulatory Visit: Payer: Self-pay | Admitting: Endocrinology

## 2020-02-24 ENCOUNTER — Telehealth: Payer: Self-pay | Admitting: Endocrinology

## 2020-02-24 ENCOUNTER — Ambulatory Visit
Admission: RE | Admit: 2020-02-24 | Discharge: 2020-02-24 | Disposition: A | Discharge status: Home / Self Care | Payer: Medicare Other | Source: Ambulatory Visit | Attending: Pulmonary Disease | Admitting: Pulmonary Disease

## 2020-02-24 DIAGNOSIS — R0602 Shortness of breath: Secondary | ICD-10-CM

## 2020-02-24 MED ORDER — COLESEVELAM HCL 625 MG PO TABS: 2500.0000 mg | ORAL_TABLET | Freq: Every day | ORAL | 3 refills | Status: DC

## 2020-02-24 MED ORDER — INSULIN PEN NEEDLE 31G X 5 MM MISC: 1.0000 | Freq: Two times a day (BID) | 3 refills | Status: DC

## 2020-02-24 NOTE — Telephone Encounter (Signed)
Requesting Amoxicillin refills

## 2020-02-24 NOTE — Telephone Encounter (Signed)
Rx sent to the pharmacy today and [facility] form waiting to be comeplete by Dr. Everardo All.

## 2020-02-24 NOTE — Telephone Encounter (Signed)
This should have been sent in yesterday.  David Maynard. Jimmey Ralph, MD 02/24/2020 10:34 AM

## 2020-02-24 NOTE — Telephone Encounter (Signed)
I don't know why, but I have sent refill.

## 2020-02-24 NOTE — Telephone Encounter (Signed)
Notified pt ---received [facility] form and put on table for Dr. Everardo All to complete.

## 2020-02-24 NOTE — Telephone Encounter (Signed)
Rx was send  

## 2020-02-24 NOTE — Telephone Encounter (Signed)
Pt called because dr Everardo All denied request for refill on Colesevelam and was wondering why?  Pt also called to see if we have received request for Dexcom and supplys that was faxed on the 25th?    please call patient at [phone]

## 2020-02-24 NOTE — Telephone Encounter (Signed)
Please see below.

## 2020-02-24 NOTE — Telephone Encounter (Signed)
done

## 2020-02-25 ENCOUNTER — Other Ambulatory Visit: Payer: Self-pay | Admitting: Endocrinology

## 2020-02-25 NOTE — Progress Notes (Signed)
Please inform patient of the following:  Labs are all stable. Do not need to make any changes to his treatment plan at this time. We can recheck again next time he comes in for blood work.  Katina Degree. Jimmey Ralph, MD 02/25/2020 2:30 PM

## 2020-02-26 ENCOUNTER — Telehealth: Payer: Self-pay

## 2020-02-26 ENCOUNTER — Encounter: Payer: Self-pay | Admitting: Family Medicine

## 2020-02-26 ENCOUNTER — Other Ambulatory Visit: Payer: Self-pay

## 2020-02-26 ENCOUNTER — Other Ambulatory Visit: Payer: Self-pay | Admitting: *Deleted

## 2020-02-26 ENCOUNTER — Encounter: Payer: Self-pay | Admitting: Endocrinology

## 2020-02-26 MED ORDER — ATORVASTATIN CALCIUM 40 MG PO TABS: 40.0000 mg | ORAL_TABLET | Freq: Every day | ORAL | 3 refills | Status: DC

## 2020-02-26 NOTE — Telephone Encounter (Signed)
Ok to placed referral to cardiologist

## 2020-02-26 NOTE — Telephone Encounter (Signed)
Patient is returning a phone call.  

## 2020-02-29 ENCOUNTER — Encounter: Payer: Self-pay | Admitting: Endocrinology

## 2020-02-29 DIAGNOSIS — E119 Type 2 diabetes mellitus without complications: Secondary | ICD-10-CM

## 2020-02-29 NOTE — Telephone Encounter (Signed)
Lab results send via Mychart

## 2020-03-01 ENCOUNTER — Encounter: Payer: Self-pay | Admitting: Family Medicine

## 2020-03-01 MED ORDER — INSULIN PEN NEEDLE 31G X 5 MM MISC: 1.0000 | Freq: Two times a day (BID) | 3 refills | Status: DC

## 2020-03-02 ENCOUNTER — Other Ambulatory Visit: Payer: Self-pay | Admitting: Emergency Medicine

## 2020-03-02 DIAGNOSIS — I2510 Atherosclerotic heart disease of native coronary artery without angina pectoris: Secondary | ICD-10-CM

## 2020-03-02 NOTE — Telephone Encounter (Signed)
Referral placed to Cardiology.

## 2020-03-02 NOTE — Progress Notes (Signed)
Cardiology Office Note:   Date:  03/03/2020  NAME:  David Maynard    MRN: [id number] DOB:  1950/12/06   PCP:  Ardith Dark, MD  Cardiologist:  No primary care provider on file.   Referring MD: Ardith Dark, MD   Chief Complaint  Patient presents with  . Coronary Artery Disease   History of Present Illness:   David Maynard is a 70 y.o. male with a hx of DM, HLD who is being seen today for the evaluation of coronary calcifications at the request of Ardith Dark, MD. Coronary calcifications seen on CT scan. CT chest resolution 02/24/2020 post covid showed mild coronary calcifications in the mid LAD and mid RCA.  Longstanding history of diabetes.  He also has hypothyroidism.  His diabetes is well controlled.  He does take Lipitor.  Most recent LDL 61.  He recently had COVID-19 and was getting a CT scan for follow-up of that.  Found to have coronary calcifications.  He reports that his shortness of breath from COVID is improving.  No history of hypertension BP 118/60.  He reports he rides an exercise bike for 15 minutes daily.  No chest pain or shortness of breath reported with this.  He plays tennis on Tuesday evenings.  No chest pain or shortness of breath reported with this.  He reports heavy exertion can get him out of breath but he expects this given his age.  He has never had a heart attack or stroke.  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  Echocardiogram in 2016 was normal.  No symptoms concerning for angina.  He is a never smoker.  Does not drink alcohol or use drugs.  His father had kidney disease.  His mother is currently living at age 72.  He has 2 children.  One is a physical therapist and one is a Multimedia programmer.  Overall doing well without any symptoms of angina.  Problem List 1. DM -A1c 6.4 2. HLD -T chol 149, HDL 77, LDL 61, TG 55  3. RBBB/LAFB 4. Hypothyroidism  Past Medical History: Past Medical History:  Diagnosis Date   . Anemia    history of anemia  . 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 Coronary artery disease   . Depressive disorder, not elsewhere classified    none  . Diabetes mellitus type II   . Hypercholesterolemia   . Hypogonadism male   . Primary localized osteoarthritis of left knee 12/11/2017  . Primary localized osteoarthritis of right knee 10/23/2017  . 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 Thyroid disease   . Unspecified hypothyroidism     Past Surgical History: Past Surgical History:  Procedure Laterality Date  . ESOPHAGOGASTRODUODENOSCOPY  06/16/2003  . INGUINAL HERNIA REPAIR     left  . JOINT REPLACEMENT    . KNEE ARTHROSCOPY     Left  . KNEE ARTHROSCOPY W/ ACL RECONSTRUCTION     left  . ROTATOR CUFF REPAIR     Right x2  . Stress Cardiolite  07/15/2000  . TOTAL KNEE ARTHROPLASTY Right 11/04/2017  . TOTAL KNEE ARTHROPLASTY Right 11/04/2017   Procedure: TOTAL KNEE ARTHROPLASTY;  Surgeon: Salvatore Marvel, MD;  Location: [facility] OR;  Service: Orthopedics;  Laterality: Right;  . TOTAL KNEE ARTHROPLASTY Left 12/23/2017  . TOTAL KNEE ARTHROPLASTY Left 12/23/2017   Procedure: TOTAL KNEE ARTHROPLASTY;  Surgeon: Salvatore Marvel, MD;  Location: [facility] OR;  Service: Orthopedics;  Laterality:  Left;  . VASECTOMY  05/2002    Current Medications: Current Meds  Medication Sig  . ACCU-CHEK GUIDE test strip USE TO CHECK BLOOD SUGAR 6  TIMES DAILY  . amoxicillin (AMOXIL) 500 MG tablet TAKE FOUR TABLETS BY MOUTH 1 HOUR BEFORE DENTAL WORK  . ascorbic acid (VITAMIN C) 500 MG tablet Take 1 tablet (500 mg total) by mouth daily.  Marland Kitchen atorvastatin (LIPITOR) 40 MG tablet Take 1 tablet (40 mg total) by mouth daily.  . bromocriptine (PARLODEL) 2.5 MG tablet TAKE 1 TABLET BY MOUTH  DAILY  . Calcium Carbonate (CALCIUM 600 PO) Take 1 tablet by mouth daily.  . Cinnamon 500 MG capsule Take 3,000 mg by mouth 2 (two) times daily.  . clomiPHENE (CLOMID) 50 MG tablet Take 0.5 tablets (25 mg total) by mouth daily.   . colesevelam (WELCHOL) 625 MG tablet TAKE 4 TABLETS BY MOUTH  DAILY  . Continuous Blood Gluc Sensor (DEXCOM G6 SENSOR) MISC 1 Device by Does not apply route See admin instructions. 1 every 10 days  . docusate sodium (COLACE) 100 MG capsule Take 100 mg by mouth 2 (two) times daily.  . Ferrous Sulfate Dried 45 MG TBCR Take 1 tablet by mouth 2 (two) times daily.  . Glucosamine-Chondroit-Vit C-Mn (GLUCOSAMINE CHONDR 1500 COMPLX PO) Take 2 tablets by mouth daily.  . insulin lispro (HUMALOG KWIKPEN) 100 UNIT/ML KwikPen Inject 2-4 Units into the skin daily with supper. And pen needles 1/day  . Insulin Pen Needle 31G X 5 MM MISC 1 Device by Does not apply route in the morning and at bedtime.  Marland Kitchen levothyroxine (SYNTHROID) 75 MCG tablet TAKE 1 TABLET BY MOUTH  DAILY  . liraglutide (VICTOZA) 18 MG/3ML SOPN Inject 0.3 mLs (1.8 mg total) into the skin every morning.  . metFORMIN (GLUCOPHAGE-XR) 500 MG 24 hr tablet Take 2 tablets (1,000 mg total) by mouth 2 (two) times daily.  . mirabegron ER (MYRBETRIQ) 25 MG TB24 tablet Take 50 mg by mouth daily.  . Multiple Vitamin (MULTIVITAMIN) tablet Take 1 tablet by mouth daily.  . nateglinide (STARLIX) 120 MG tablet Take 1 tablet (120 mg total) by mouth 3 (three) times daily with meals.  Marland Kitchen omega-3 acid ethyl esters (LOVAZA) 1 g capsule TAKE 2 CAPSULES BY MOUTH  TWICE DAILY  . omeprazole (PRILOSEC) 40 MG capsule TAKE 1 CAPSULE BY MOUTH  DAILY  . repaglinide (PRANDIN) 2 MG tablet Take 2 tablets (4 mg total) by mouth 3 (three) times daily before meals.  . tadalafil (ADCIRCA/CIALIS) 20 MG tablet Take 20 mg by mouth daily as needed for erectile dysfunction.  . Trospium Chloride 60 MG CP24   . Turmeric 500 MG CAPS Take 1,500 mg by mouth daily.    Allergies:    Patient has no known allergies.   Social History: Social History   Socioeconomic History  . Marital status: Married    Spouse name: Not on file  . Number of children: 2  . Years of education: Not on file   . Highest education level: Not on file  Occupational History  . Occupation: Retired Water engineer for [employer]  Tobacco Use  . Smoking status: Never Smoker  . Smokeless tobacco: Never Used  Vaping Use  . Vaping Use: Never used  Substance and Sexual Activity  . Alcohol use: Yes    Alcohol/week: 1.0 standard drink    Types: 1 Cans of beer per week    Comment: weekly  . Drug use: No  . Sexual  activity: Not on file  Other Topics Concern  . Not on file  Social History Narrative  . Not on file   Social Determinants of Health   Financial Resource Strain: Low Risk   . Difficulty of Paying Living Expenses: Not hard at all  Food Insecurity: No Food Insecurity  . Worried About Programme researcher, broadcasting/film/videounning Out of Food in the Last Year: Never true  . Ran Out of Food in the Last Year: Never true  Transportation Needs: No Transportation Needs  . Lack of Transportation (Medical): No  . Lack of Transportation (Non-Medical): No  Physical Activity: Insufficiently Active  . Days of Exercise per Week: 5 days  . Minutes of Exercise per Session: 20 min  Stress: No Stress Concern Present  . Feeling of Stress : Not at all  Social Connections: Socially Integrated  . Frequency of Communication with Friends and Family: More than three times a week  . Frequency of Social Gatherings with Friends and Family: More than three times a week  . Attends Religious Services: More than 4 times per year  . Active Member of Clubs or Organizations: Yes  . Attends BankerClub or Organization Meetings: 1 to 4 times per year  . Marital Status: Married    Family History: The patient's family history includes Hypertension in his mother; Kidney disease in his father. There is no history of Breast cancer, Celiac disease, Cirrhosis, Clotting disorder, Colitis, Colon cancer, Colon polyps, Crohn's disease, Cystic fibrosis, Diabetes, Esophageal cancer, Heart disease, Hemochromatosis, Inflammatory bowel disease, Irritable bowel syndrome, Liver  cancer, Liver disease, Ovarian cancer, Pancreatic cancer, Prostate cancer, Rectal cancer, Stomach cancer, Ulcerative colitis, Uterine cancer, or Wilson's disease.  ROS:   All other ROS reviewed and negative. Pertinent positives noted in the HPI.     EKGs/Labs/Other Studies Reviewed:   The following studies were personally reviewed by me today:  EKG:  EKG is ordered today.  The ekg ordered today demonstrates normal sinus rhythm heart rate 73, right bundle branch block, left anterior fascicular block, and was personally reviewed by me.   TTE 10/04/2014 - Left ventricle: The cavity size was normal. Wall thickness was  normal. Systolic function was normal. The estimated ejection  fraction was in the range of 55% to 60%. Wall motion was normal;  there were no regional wall motion abnormalities. Left  ventricular diastolic function parameters were normal.   Recent Labs: 10/01/2019: Magnesium 2.4 02/23/2020: ALT 28; BUN 33; Creatinine, Ser 0.93; Hemoglobin 13.5; Platelets 177.0; Potassium 4.4; Sodium 132; TSH 2.74   Recent Lipid Panel    Component Value Date/Time   CHOL 149 02/23/2020 1036   TRIG 55.0 02/23/2020 1036   HDL 77.40 02/23/2020 1036   CHOLHDL 2 02/23/2020 1036   VLDL 11.0 02/23/2020 1036   LDLCALC 61 02/23/2020 1036    Physical Exam:   VS:  BP 118/60   Pulse 73   Ht 5\' 8"  (1.727 m)   Wt 139 lb 3.2 oz (63.1 kg)   SpO2 100%   BMI 21.17 kg/m    Wt Readings from Last 3 Encounters:  03/03/20 139 lb 3.2 oz (63.1 kg)  02/23/20 141 lb 9.6 oz (64.2 kg)  02/15/20 143 lb (64.9 kg)    General: Well nourished, well developed, in no acute distress Head: Atraumatic, normal size  Eyes: PEERLA, EOMI  Neck: Supple, no JVD Endocrine: No thryomegaly Cardiac: Normal S1, S2; RRR; no murmurs, rubs, or gallops Lungs: Clear to auscultation bilaterally, no wheezing, rhonchi or rales  Abd: Soft,  nontender, no hepatomegaly  Ext: No edema, pulses 2+ Musculoskeletal: No deformities,  BUE and BLE strength normal and equal Skin: Warm and dry, no rashes   Neuro: Alert and oriented to person, place, time, and situation, CNII-XII grossly intact, no focal deficits  Psych: Normal mood and affect   ASSESSMENT:   David Maynard is a 70 y.o. male who presents for the following: 1. Coronary artery calcification seen on CT scan   2. Mixed hyperlipidemia   3. RBBB   4. LAFB (left anterior fascicular block)     PLAN:   1. Coronary artery calcification seen on CT scan 2. Mixed hyperlipidemia -Coronary calcification seen on recent CT chest.  Mild in my opinion.  I have recommended coronary calcium scoring for quantitation of this.  He has no symptoms of angina.  He is quite active without any major symptoms.  He likely will not need a stress test.  I have recommended he take an aspirin 81 mg a day.  He should continue on his Lipitor 40 mg daily.  Most recent LDL cholesterol is 61.  His cholesterol level is already at goal.  His goal LDL cholesterol is less than 70.  He has exceedingly high good cholesterol with an HDL of 77.  These are protective.  Despite his diabetes I see him is low risk.  I recommended to maintain a healthy exercise regimen.  He is already working on his diet.  He seems to be in good shape.  3. RBBB 4. LAFB (left anterior fascicular block) -EKG does demonstrate bifascicular block.  No symptoms from this.  I have recommended to repeat an echocardiogram to make sure nothing is changed.  He is short of breath and getting over Covid.  We also will evaluate this as well.  Overall I think his heart will be structurally normal.  We will see him yearly.   Disposition: Return in about 1 year (around 03/03/2021).  Medication Adjustments/Labs and Tests Ordered: Current medicines are reviewed at length with the patient today.  Concerns regarding medicines are outlined above.  Orders Placed This Encounter  Procedures  . CT CARDIAC SCORING (SELF PAY ONLY)  . EKG 12-Lead  .  ECHOCARDIOGRAM COMPLETE   No orders of the defined types were placed in this encounter.   Patient Instructions  Medication Instructions:  The current medical regimen is effective;  continue present plan and medications.  *If you need a refill on your cardiac medications before your next appointment, please call your pharmacy*  Testing/Procedures: CALCIUM SCORE  Echocardiogram - Your physician has requested that you have an echocardiogram. Echocardiography is a painless test that uses sound waves to create images of your heart. It provides your doctor with information about the size and shape of your heart and how well your heart's chambers and valves are working. This procedure takes approximately one hour. There are no restrictions for this procedure. This will be performed at our [facility] location - [address], [address].    Follow-Up: At [facility], you and your health needs are our priority.  As part of our continuing mission to provide you with exceptional heart care, we have created designated Provider Care Teams.  These Care Teams include your primary Cardiologist (physician) and Advanced Practice Providers (APPs -  Physician Assistants and Nurse Practitioners) who all work together to provide you with the care you need, when you need it.  We recommend signing up for the patient portal called "MyChart".  Sign up information is provided on this After Visit Summary.  MyChart is used to connect with patients for Virtual Visits (Telemedicine).  Patients are able to view lab/test results, encounter notes, upcoming appointments, etc.  Non-urgent messages can be sent to your provider as well.   To learn more about what you can do with MyChart, go to [email or web address].    Your next appointment:   12 month(s)  The format for your next appointment:   In Person  Provider:   Lennie Odor, MD         Signed, Lenna Gilford. Flora Lipps, MD [facility]  [address] [place], [place] [address] [phone]  03/03/2020 10:26 AM

## 2020-03-03 ENCOUNTER — Ambulatory Visit (INDEPENDENT_AMBULATORY_CARE_PROVIDER_SITE_OTHER): Payer: Medicare Other | Admitting: Cardiovascular Disease

## 2020-03-03 ENCOUNTER — Other Ambulatory Visit: Payer: Self-pay

## 2020-03-03 ENCOUNTER — Encounter: Payer: Self-pay | Admitting: Cardiovascular Disease

## 2020-03-03 VITALS — BP 118/60 | HR 73 | Ht 68.0 in | Wt 139.2 lb

## 2020-03-03 DIAGNOSIS — I444 Left anterior fascicular block: Secondary | ICD-10-CM | POA: Diagnosis not present

## 2020-03-03 DIAGNOSIS — E782 Mixed hyperlipidemia: Secondary | ICD-10-CM

## 2020-03-03 DIAGNOSIS — I4510 Unspecified right bundle-branch block: Secondary | ICD-10-CM

## 2020-03-03 DIAGNOSIS — I2510 Atherosclerotic heart disease of native coronary artery without angina pectoris: Secondary | ICD-10-CM

## 2020-03-03 NOTE — Patient Instructions (Signed)
Medication Instructions:  The current medical regimen is effective;  continue present plan and medications.  *If you need a refill on your cardiac medications before your next appointment, please call your pharmacy*  Testing/Procedures: CALCIUM SCORE  Echocardiogram - Your physician has requested that you have an echocardiogram. Echocardiography is a painless test that uses sound waves to create images of your heart. It provides your doctor with information about the size and shape of your heart and how well your heart's chambers and valves are working. This procedure takes approximately one hour. There are no restrictions for this procedure. This will be performed at our [facility] location - [address], [address].    Follow-Up: At [facility], you and your health needs are our priority.  As part of our continuing mission to provide you with exceptional heart care, we have created designated Provider Care Teams.  These Care Teams include your primary Cardiologist (physician) and Advanced Practice Providers (APPs -  Physician Assistants and Nurse Practitioners) who all work together to provide you with the care you need, when you need it.  We recommend signing up for the patient portal called "MyChart".  Sign up information is provided on this After Visit Summary.  MyChart is used to connect with patients for Virtual Visits (Telemedicine).  Patients are able to view lab/test results, encounter notes, upcoming appointments, etc.  Non-urgent messages can be sent to your provider as well.   To learn more about what you can do with MyChart, go to [email or web address].    Your next appointment:   12 month(s)  The format for your next appointment:   In Person  Provider:   Lennie Odor, MD

## 2020-03-04 ENCOUNTER — Other Ambulatory Visit (HOSPITAL_COMMUNITY): Payer: Medicare Other

## 2020-03-08 ENCOUNTER — Encounter: Payer: Self-pay | Admitting: Endocrinology

## 2020-03-09 ENCOUNTER — Encounter: Payer: Self-pay | Admitting: Endocrinology

## 2020-03-11 ENCOUNTER — Other Ambulatory Visit: Payer: Self-pay | Admitting: Endocrinology

## 2020-03-11 MED ORDER — INSULIN LISPRO (1 UNIT DIAL) 100 UNIT/ML (KWIKPEN): 4.0000 [IU] | PEN_INJECTOR | Freq: Every day | SUBCUTANEOUS | 11 refills | Status: DC

## 2020-03-12 ENCOUNTER — Ambulatory Visit
Admission: EM | Admit: 2020-03-12 | Discharge: 2020-03-12 | Disposition: A | Discharge status: Home / Self Care | Payer: Medicare Other | Attending: Emergency Medicine | Admitting: Emergency Medicine

## 2020-03-12 ENCOUNTER — Encounter: Payer: Self-pay | Admitting: Emergency Medicine

## 2020-03-12 ENCOUNTER — Other Ambulatory Visit: Payer: Self-pay

## 2020-03-12 DIAGNOSIS — R0781 Pleurodynia: Secondary | ICD-10-CM | POA: Diagnosis not present

## 2020-03-12 MED ORDER — CELECOXIB 200 MG PO CAPS: 200.0000 mg | ORAL_CAPSULE | Freq: Two times a day (BID) | ORAL | 0 refills | Status: DC

## 2020-03-12 MED ORDER — TIZANIDINE HCL 4 MG PO TABS: 2.0000 mg | ORAL_TABLET | Freq: Four times a day (QID) | ORAL | 0 refills | Status: DC | PRN

## 2020-03-12 NOTE — ED Provider Notes (Signed)
[facility]    CSN: [id number] Arrival date & time: 03/12/20  0817      History   Chief Complaint Chief Complaint  Patient presents with  . rib pain    HPI David Maynard is a 70 y.o. male history of CAD, DM type II, BPH presenting today for evaluation of flank pain. Reports moving boxes in the garage yesterday and felt a pulling sensation to his left lower rib cage. Denies direct trauma to area. Symptoms subsided, but last night while he rolled over in bed felt the pain again which has been persistent since. Poor sleep last night due to discomfort. Took Celebrex this morning which has helped some. He denies any difficulty breathing.  HPI  Past Medical History:  Diagnosis Date  . Anemia    history of anemia  . 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 Coronary artery disease   . Depressive disorder, not elsewhere classified    none  . Diabetes mellitus type II   . Hypercholesterolemia   . Hypogonadism male   . Primary localized osteoarthritis of left knee 12/11/2017  . Primary localized osteoarthritis of right knee 10/23/2017  . 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 Thyroid disease   . Unspecified hypothyroidism     Patient Active Problem List   Diagnosis Date Noted  . Umbilical hernia without obstruction and without gangrene 02/23/2020  . Gastroesophageal reflux disease 01/13/2019  . BPH  01/08/2018  . ED (erectile dysfunction) 01/08/2018  . OAB (overactive bladder) 01/08/2018  . History of bilateral knee replacement 12/11/2017  . Lumbar disc disease 09/22/2010  . Diabetes mellitus, type II (HCC)   . Hypogonadism male 06/19/2007  . Dyslipidemia associated with type 2 diabetes mellitus (HCC) 06/19/2007  . Hypothyroidism 03/05/2007    Past Surgical History:  Procedure Laterality Date  . ESOPHAGOGASTRODUODENOSCOPY  06/16/2003  . INGUINAL HERNIA REPAIR     left  . JOINT REPLACEMENT    . KNEE ARTHROSCOPY     Left  . KNEE ARTHROSCOPY W/ ACL  RECONSTRUCTION     left  . ROTATOR CUFF REPAIR     Right x2  . Stress Cardiolite  07/15/2000  . TOTAL KNEE ARTHROPLASTY Right 11/04/2017  . TOTAL KNEE ARTHROPLASTY Right 11/04/2017   Procedure: TOTAL KNEE ARTHROPLASTY;  Surgeon: Salvatore MarvelWainer, Robert, MD;  [facility];  Service: Orthopedics;  Laterality: Right;  . TOTAL KNEE ARTHROPLASTY Left 12/23/2017  . TOTAL KNEE ARTHROPLASTY Left 12/23/2017   Procedure: TOTAL KNEE ARTHROPLASTY;  Surgeon: Salvatore MarvelWainer, Robert, MD;  [facility];  Service: Orthopedics;  Laterality: Left;  Marland Kitchen. VASECTOMY  05/2002       Home Medications    Prior to Admission medications   Medication Sig Start Date End Date Taking? Authorizing Provider  celecoxib (CELEBREX) 200 MG capsule Take 1 capsule (200 mg total) by mouth 2 (two) times daily. 03/12/20  Yes Kierrah Kilbride C, PA-C  tiZANidine (ZANAFLEX) 4 MG tablet Take 0.5-1 tablets (2-4 mg total) by mouth every 6 (six) hours as needed for muscle spasms. 03/12/20  Yes Abdulkadir Emmanuel, [place] C, PA-C  ACCU-CHEK GUIDE test strip USE TO CHECK BLOOD SUGAR 6  TIMES DAILY 06/30/19   Romero BellingEllison, Sean, MD  amoxicillin (AMOXIL) 500 MG tablet TAKE FOUR TABLETS BY MOUTH 1 HOUR BEFORE DENTAL WORK 02/24/20   Ardith DarkParker, Caleb M, MD  ascorbic acid (VITAMIN C) 500 MG tablet Take 1 tablet (500 mg total) by mouth daily. 09/28/19   Almon HerculesGonfa, Taye T, MD  atorvastatin (LIPITOR) 40 MG tablet Take 1 tablet (40 mg total) by mouth daily. 02/26/20   Ardith Dark, MD  bromocriptine (PARLODEL) 2.5 MG tablet TAKE 1 TABLET BY MOUTH  DAILY 11/01/19   Romero Belling, MD  Calcium Carbonate (CALCIUM 600 PO) Take 1 tablet by mouth daily.    [provider]  Cinnamon 500 MG capsule Take 3,000 mg by mouth 2 (two) times daily.    [provider]  clomiPHENE (CLOMID) 50 MG tablet Take 0.5 tablets (25 mg total) by mouth daily. 09/08/19   Romero Belling, MD  colesevelam [facility]) 625 MG tablet TAKE 4 TABLETS BY MOUTH  DAILY 02/26/20   Romero Belling, MD  Continuous Blood Gluc  Sensor (DEXCOM G6 SENSOR) MISC 1 Device by Does not apply route See admin instructions. 1 every 10 days 02/15/20   Romero Belling, MD  docusate sodium (COLACE) 100 MG capsule Take 100 mg by mouth 2 (two) times daily.    [provider]  Ferrous Sulfate Dried 45 MG TBCR Take 1 tablet by mouth 2 (two) times daily.    [provider]  Glucosamine-Chondroit-Vit C-Mn (GLUCOSAMINE CHONDR 1500 COMPLX PO) Take 2 tablets by mouth daily.    [provider]  insulin lispro (HUMALOG KWIKPEN) 100 UNIT/ML KwikPen Inject 4 Units into the skin daily with supper. And pen needles 1/day 03/11/20   Romero Belling, MD  Insulin Pen Needle 31G X 5 MM MISC 1 Device by Does not apply route in the morning and at bedtime. 03/01/20   Romero Belling, MD  levothyroxine (SYNTHROID) 75 MCG tablet TAKE 1 TABLET BY MOUTH  DAILY 12/31/19   Romero Belling, MD  liraglutide (VICTOZA) 18 MG/3ML SOPN Inject 0.3 mLs (1.8 mg total) into the skin every morning. 09/08/19   Romero Belling, MD  metFORMIN (GLUCOPHAGE-XR) 500 MG 24 hr tablet Take 2 tablets (1,000 mg total) by mouth 2 (two) times daily. 11/11/19   Romero Belling, MD  mirabegron ER (MYRBETRIQ) 25 MG TB24 tablet Take 50 mg by mouth daily.    [provider]  Multiple Vitamin (MULTIVITAMIN) tablet Take 1 tablet by mouth daily.    [provider]  nateglinide (STARLIX) 120 MG tablet Take 1 tablet (120 mg total) by mouth 3 (three) times daily with meals. 10/26/19   Romero Belling, MD  omega-3 acid ethyl esters (LOVAZA) 1 g capsule TAKE 2 CAPSULES BY MOUTH  TWICE DAILY 12/31/19   Ardith Dark, MD  omeprazole (PRILOSEC) 40 MG capsule TAKE 1 CAPSULE BY MOUTH  DAILY 09/08/19   Ardith Dark, MD  repaglinide (PRANDIN) 2 MG tablet Take 2 tablets (4 mg total) by mouth 3 (three) times daily before meals. 10/26/19   Romero Belling, MD  tadalafil (ADCIRCA/CIALIS) 20 MG tablet Take 20 mg by mouth daily as needed for erectile dysfunction.    [provider]   Trospium Chloride 60 MG CP24  12/03/18   [provider]  Turmeric 500 MG CAPS Take 1,500 mg by mouth daily.    [provider]    Family History Family History  Problem Relation Age of Onset  . Hypertension Mother   . Kidney disease Father   . Breast cancer Neg Hx   . Celiac disease Neg Hx   . Cirrhosis Neg Hx   . Clotting disorder Neg Hx   . Colitis Neg Hx   . Colon cancer Neg Hx   . Colon polyps Neg Hx   . Crohn's disease Neg Hx   .  Cystic fibrosis Neg Hx   . Diabetes Neg Hx   . Esophageal cancer Neg Hx   . Heart disease Neg Hx   . Hemochromatosis Neg Hx   . Inflammatory bowel disease Neg Hx   . Irritable bowel syndrome Neg Hx   . Liver cancer Neg Hx   . Liver disease Neg Hx   . Ovarian cancer Neg Hx   . Pancreatic cancer Neg Hx   . Prostate cancer Neg Hx   . Rectal cancer Neg Hx   . Stomach cancer Neg Hx   . Ulcerative colitis Neg Hx   . Uterine cancer Neg Hx   . Wilson's disease Neg Hx     Social History Social History   Tobacco Use  . Smoking status: Never Smoker  . Smokeless tobacco: Never Used  Vaping Use  . Vaping Use: Never used  Substance Use Topics  . Alcohol use: Yes    Alcohol/week: 1.0 standard drink    Types: 1 Cans of beer per week    Comment: weekly  . Drug use: No     Allergies   Patient has no known allergies.   Review of Systems Review of Systems  Constitutional: Negative for fever.  HENT: Negative for sore throat.   Respiratory: Negative for shortness of breath.   Cardiovascular: Negative for chest pain.  Gastrointestinal: Negative for abdominal pain, nausea and vomiting.  Genitourinary: Positive for flank pain. Negative for difficulty urinating, dysuria, frequency, penile discharge, penile pain, penile swelling, scrotal swelling and testicular pain.  Skin: Negative for rash.  Neurological: Negative for dizziness, light-headedness and headaches.     Physical Exam Triage Vital Signs ED Triage Vitals  Enc  Vitals Group     BP      Pulse      Resp      Temp      Temp src      SpO2      Weight      Height      Head Circumference      Peak Flow      Pain Score      Pain Loc      Pain Edu?      Excl. in GC?    No data found.  Updated Vital Signs BP (!) 163/78 (BP Location: Left Arm)   Pulse 72   Temp 97.8 F (36.6 C) (Oral)   Resp 18   SpO2 97%   Visual Acuity Right Eye Distance:   Left Eye Distance:   Bilateral Distance:    Right Eye Near:   Left Eye Near:    Bilateral Near:     Physical Exam Vitals and nursing note reviewed.  Constitutional:      Appearance: He is well-developed and well-nourished.     Comments: No acute distress  HENT:     Head: Normocephalic and atraumatic.     Nose: Nose normal.  Eyes:     Conjunctiva/sclera: Conjunctivae normal.  Cardiovascular:     Rate and Rhythm: Normal rate and regular rhythm.  Pulmonary:     Effort: Pulmonary effort is normal. No respiratory distress.     Comments: Breathing comfortably at rest, CTABL, no wheezing, rales or other adventitious sounds auscultated Abdominal:     General: There is no distension.  Musculoskeletal:        General: Normal range of motion.     Cervical back: Neck supple.     Comments: Tenderness to palpation to left lower  rib cage and mid axillary line, does not extend posteriorly or anteriorly  Skin:    General: Skin is warm and dry.     Comments: No rash or bruising to area  Neurological:     Mental Status: He is alert and oriented to person, place, and time.  Psychiatric:        Mood and Affect: Mood and affect normal.      UC Treatments / Results  Labs (all labs ordered are listed, but only abnormal results are displayed) Labs Reviewed - No data to display  EKG   Radiology No results found.  Procedures Procedures (including critical care time)  Medications Ordered in UC Medications - No data to display  Initial Impression / Assessment and Plan / UC Course  I have  reviewed the triage vital signs and the nursing notes.  Pertinent labs & imaging results that were available during my care of the patient were reviewed by me and considered in my medical decision making (see chart for details).     Suspect likely muscle straining from lifting and moving, lower suspicion of rib fracture at this time, lungs clear. Recommending continued anti-inflammatories, a lot of muscle relaxer for bedtime to help with nighttime comfort. Ice and heat and monitor for gradual resolution.  Discussed strict return precautions. Patient verbalized understanding and is agreeable with plan.  Final Clinical Impressions(s) / UC Diagnoses   Final diagnoses:  Rib pain on left side     Discharge Instructions     May continue Celebrex twice daily as needed for pain Supplement tizanidine at home/bedtime, may cause drowsiness Alternate ice and heat to area Monitor for gradual resolution Follow-up if not improving or worsening    ED Prescriptions    Medication Sig Dispense Auth. Provider   celecoxib (CELEBREX) 200 MG capsule Take 1 capsule (200 mg total) by mouth 2 (two) times daily. 30 capsule Trinidy Masterson C, PA-C   tiZANidine (ZANAFLEX) 4 MG tablet Take 0.5-1 tablets (2-4 mg total) by mouth every 6 (six) hours as needed for muscle spasms. 30 tablet Joselito Fieldhouse, [place] C, PA-C     PDMP not reviewed this encounter.   Lew Dawes, [place] 03/12/20 [phone]

## 2020-03-12 NOTE — Discharge Instructions (Signed)
May continue Celebrex twice daily as needed for pain Supplement tizanidine at home/bedtime, may cause drowsiness Alternate ice and heat to area Monitor for gradual resolution Follow-up if not improving or worsening

## 2020-03-12 NOTE — ED Triage Notes (Signed)
Pt here for left sided rib area pain after stretching yesterday while picking up a box; pt sts painful to touch and when moving; pt sts he took a celebrex PTA

## 2020-03-14 ENCOUNTER — Telehealth: Payer: Self-pay

## 2020-03-14 ENCOUNTER — Other Ambulatory Visit (HOSPITAL_COMMUNITY): Payer: Medicare Other

## 2020-03-14 NOTE — Telephone Encounter (Signed)
error 

## 2020-03-17 ENCOUNTER — Encounter: Payer: Self-pay | Admitting: Endocrinology

## 2020-03-18 ENCOUNTER — Other Ambulatory Visit (HOSPITAL_COMMUNITY)
Admission: RE | Admit: 2020-03-18 | Discharge: 2020-03-18 | Disposition: A | Discharge status: Home / Self Care | Payer: Medicare Other | Source: Ambulatory Visit | Attending: Pulmonary Disease | Admitting: Pulmonary Disease

## 2020-03-18 DIAGNOSIS — Z20822 Contact with and (suspected) exposure to covid-19: Secondary | ICD-10-CM | POA: Diagnosis not present

## 2020-03-18 DIAGNOSIS — Z01812 Encounter for preprocedural laboratory examination: Secondary | ICD-10-CM | POA: Diagnosis present

## 2020-03-18 LAB — SARS CORONAVIRUS 2 (TAT 6-24 HRS): SARS Coronavirus 2: NEGATIVE

## 2020-03-21 ENCOUNTER — Ambulatory Visit (INDEPENDENT_AMBULATORY_CARE_PROVIDER_SITE_OTHER): Payer: Medicare Other | Admitting: Pulmonary Disease

## 2020-03-21 ENCOUNTER — Other Ambulatory Visit: Payer: Self-pay

## 2020-03-21 DIAGNOSIS — R0602 Shortness of breath: Secondary | ICD-10-CM | POA: Diagnosis not present

## 2020-03-21 LAB — PULMONARY FUNCTION TEST
DL/VA % pred: 118 %
DL/VA: 4.86 ml/min/mmHg/L
DLCO cor % pred: 103 %
DLCO cor: 25.46 ml/min/mmHg
DLCO unc % pred: 100 %
DLCO unc: 24.63 ml/min/mmHg
FEF 25-75 Post: 3.77 L/sec
FEF 25-75 Pre: 2.99 L/sec
FEF2575-%Change-Post: 26 %
FEF2575-%Pred-Post: 161 %
FEF2575-%Pred-Pre: 127 %
FEV1-%Change-Post: 5 %
FEV1-%Pred-Post: 95 %
FEV1-%Pred-Pre: 90 %
FEV1-Post: 2.9 L
FEV1-Pre: 2.74 L
FEV1FVC-%Change-Post: 3 %
FEV1FVC-%Pred-Pre: 111 %
FEV6-%Change-Post: 2 %
FEV6-%Pred-Post: 87 %
FEV6-%Pred-Pre: 85 %
FEV6-Post: 3.4 L
FEV6-Pre: 3.31 L
FEV6FVC-%Pred-Post: 106 %
FEV6FVC-%Pred-Pre: 106 %
FVC-%Change-Post: 2 %
FVC-%Pred-Post: 82 %
FVC-%Pred-Pre: 80 %
FVC-Post: 3.4 L
FVC-Pre: 3.31 L
Post FEV1/FVC ratio: 85 %
Post FEV6/FVC ratio: 100 %
Pre FEV1/FVC ratio: 83 %
Pre FEV6/FVC Ratio: 100 %
RV % pred: 91 %
RV: 2.12 L
TLC % pred: 88 %
TLC: 5.83 L

## 2020-03-21 NOTE — Progress Notes (Signed)
Full PFT performed today. °

## 2020-03-28 ENCOUNTER — Encounter: Payer: Self-pay | Admitting: Pulmonary Disease

## 2020-03-28 ENCOUNTER — Other Ambulatory Visit: Payer: Self-pay | Admitting: Endocrinology

## 2020-03-28 ENCOUNTER — Telehealth: Payer: Self-pay | Admitting: Endocrinology

## 2020-03-28 ENCOUNTER — Other Ambulatory Visit: Payer: Self-pay

## 2020-03-28 ENCOUNTER — Ambulatory Visit (INDEPENDENT_AMBULATORY_CARE_PROVIDER_SITE_OTHER): Payer: Medicare Other | Admitting: Pulmonary Disease

## 2020-03-28 VITALS — BP 122/60 | HR 75 | Temp 97.4°F | Ht 68.0 in | Wt 144.0 lb

## 2020-03-28 DIAGNOSIS — U099 Post covid-19 condition, unspecified: Secondary | ICD-10-CM | POA: Diagnosis not present

## 2020-03-28 DIAGNOSIS — E119 Type 2 diabetes mellitus without complications: Secondary | ICD-10-CM

## 2020-03-28 MED ORDER — INSULIN PEN NEEDLE 31G X 5 MM MISC: 1.0000 | Freq: Two times a day (BID) | 3 refills | Status: DC

## 2020-03-28 NOTE — Telephone Encounter (Signed)
Spoke with [facility] regarding pt's correct pen needles

## 2020-03-28 NOTE — Patient Instructions (Signed)
I am glad you are feeling better I have reviewed his CT scan which shows near complete resolution of the inflammation from COVID Your lung function test is normal  Continue to maintain an active lifestyle Follow-up in clinic as needed

## 2020-03-28 NOTE — Telephone Encounter (Signed)
[facility] called again. Callback# [phone] Sheran Lawless

## 2020-03-28 NOTE — Telephone Encounter (Signed)
David Maynard with [facility] called back to speak to David Maynard - ph# [phone]

## 2020-03-28 NOTE — Telephone Encounter (Signed)
Needles sent

## 2020-03-28 NOTE — Telephone Encounter (Signed)
[facility] called to get a verbal order or call in for for new prescription for pen needles for pt. They are confused at which needle size to order for pt because there is 2 on file.  Ph# [phone] reference # [id number]

## 2020-03-28 NOTE — Progress Notes (Signed)
Yeah I usually can do [facility] or anything hospital here\usually with        David Maynard    [id number]    08-17-1950  Primary Care Physician:Parker, Katina Degree, MD  Referring Physician: Ardith Dark, MD [address] [place],  [place] [address]  Chief complaint: Follow-up for post-COVID  HPI: 70 year old with history of diabetes, hyperlipidemia Hospitalized for COVID-19 in early September.  He got treated with monoclonal antibody but was sent and was admitted to the hospital.  Treated with remdesivir, steroids.  Discharged on supplemental oxygen and was weaned off it in a few weeks as an outpatient  He has made slow recovery.  Still has mild dyspnea on exertion but is able to take up tennis and golf again. 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 Exposures: No mold, hot tub, Jacuzzi.  No feather pillows or comforters Smoking history: Never smoker Travel history: No significant travel history Relevant family history: No family history of lung disease  Interval history: He is here for review of PFTs.  No complaints at present States that breathing is doing well   Outpatient Encounter Medications as of 03/28/2020  Medication Sig  . ACCU-CHEK GUIDE test strip USE TO CHECK BLOOD SUGAR 6  TIMES DAILY  . ascorbic acid (VITAMIN C) 500 MG tablet Take 1 tablet (500 mg total) by mouth daily.  Marland Kitchen atorvastatin (LIPITOR) 40 MG tablet Take 1 tablet (40 mg total) by mouth daily.  . bromocriptine (PARLODEL) 2.5 MG tablet TAKE 1 TABLET BY MOUTH  DAILY  . Calcium Carbonate (CALCIUM 600 PO) Take 1 tablet by mouth daily.  . celecoxib (CELEBREX) 200 MG capsule Take 1 capsule (200 mg total) by mouth 2 (two) times daily.  . Cinnamon 500 MG capsule Take 3,000 mg by mouth 2 (two) times daily.  . clomiPHENE (CLOMID) 50 MG  tablet Take 0.5 tablets (25 mg total) by mouth daily.  . colesevelam (WELCHOL) 625 MG tablet TAKE 4 TABLETS BY MOUTH  DAILY  . Continuous Blood Gluc Sensor (DEXCOM G6 SENSOR) MISC 1 Device by Does not apply route See admin instructions. 1 every 10 days  . docusate sodium (COLACE) 100 MG capsule Take 100 mg by mouth 2 (two) times daily.  . Ferrous Sulfate Dried 45 MG TBCR Take 1 tablet by mouth 2 (two) times daily.  . Glucosamine-Chondroit-Vit C-Mn (GLUCOSAMINE CHONDR 1500 COMPLX PO) Take 2 tablets by mouth daily.  . insulin lispro (HUMALOG KWIKPEN) 100 UNIT/ML KwikPen Inject 4 Units into the skin daily with supper. And pen needles 1/day  . Insulin Pen Needle 31G X 5 MM MISC 1 Device by Does not apply route in the morning and at bedtime.  Marland Kitchen levothyroxine (SYNTHROID) 75 MCG tablet TAKE 1 TABLET BY MOUTH  DAILY  . liraglutide (VICTOZA) 18 MG/3ML SOPN Inject 0.3 mLs (1.8 mg total) into the skin every morning.  . metFORMIN (GLUCOPHAGE-XR) 500 MG 24 hr tablet Take 2 tablets (1,000 mg total) by mouth 2 (two) times daily.  . mirabegron ER (MYRBETRIQ) 25 MG TB24 tablet Take 50 mg by mouth daily.  . Multiple Vitamin (MULTIVITAMIN) tablet Take 1 tablet by mouth daily.  . nateglinide (STARLIX) 120 MG tablet Take 1 tablet (120 mg total) by mouth 3 (three) times daily with meals.  Marland Kitchen omega-3 acid ethyl esters (LOVAZA) 1 g capsule TAKE 2  CAPSULES BY MOUTH  TWICE DAILY  . omeprazole (PRILOSEC) 40 MG capsule TAKE 1 CAPSULE BY MOUTH  DAILY  . repaglinide (PRANDIN) 2 MG tablet Take 2 tablets (4 mg total) by mouth 3 (three) times daily before meals.  . tadalafil (ADCIRCA/CIALIS) 20 MG tablet Take 20 mg by mouth daily as needed for erectile dysfunction.  Marland Kitchen tiZANidine (ZANAFLEX) 4 MG tablet Take 0.5-1 tablets (2-4 mg total) by mouth every 6 (six) hours as needed for muscle spasms.  . Trospium Chloride 60 MG CP24   . Turmeric 500 MG CAPS Take 1,500 mg by mouth daily.  Marland Kitchen amoxicillin (AMOXIL) 500 MG tablet TAKE FOUR  TABLETS BY MOUTH 1 HOUR BEFORE DENTAL WORK (Patient not taking: Reported on 03/28/2020)   No facility-administered encounter medications on file as of 03/28/2020.    Physical Exam: Blood pressure 122/60, pulse 75, temperature (!) 97.4 F (36.3 C), temperature source Temporal, height 5\' 8"  (1.727 m), weight 144 lb (65.3 kg), SpO2 98 %. Gen:      No acute distress HEENT:  EOMI, sclera anicteric Neck:     No masses; no thyromegaly Lungs:    Clear to auscultation bilaterally; normal respiratory effort CV:         Regular rate and rhythm; no murmurs Abd:      + bowel sounds; soft, non-tender; no palpable masses, no distension Ext:    No edema; adequate peripheral perfusion Skin:      Warm and dry; no rash Neuro: alert and oriented x 3 Psych: normal mood and affect  Data Reviewed: Imaging: CTA 09/27/2019-no PE, extensive bilateral groundglass airspace disease.  Chest x-ray 12/01/2019- coarsened interstitial markings, hyperinflation.   High res CT 02/24/2020-near complete resolution of previously seen groundglass opacities, mild residual subpleural interstitial opacity, coronary artery disease I have reviewed the images personally.  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  His PFTs are normal. Keep up exercise regimen with tennis, golf.  Follow-up in pulmonary clinic as needed  Plan/Recommendations: Follow-up as needed  03/23/2020 MD [facility] 03/28/2020, 11:35 AM  CC: 05/28/2020, MD

## 2020-03-30 ENCOUNTER — Other Ambulatory Visit: Payer: Self-pay

## 2020-03-30 ENCOUNTER — Ambulatory Visit (HOSPITAL_COMMUNITY): Payer: Medicare Other | Attending: Cardiology

## 2020-03-30 ENCOUNTER — Ambulatory Visit (INDEPENDENT_AMBULATORY_CARE_PROVIDER_SITE_OTHER)
Admission: RE | Admit: 2020-03-30 | Discharge: 2020-03-30 | Disposition: A | Discharge status: Home / Self Care | Payer: Self-pay | Source: Ambulatory Visit | Attending: Cardiovascular Disease | Admitting: Cardiovascular Disease

## 2020-03-30 DIAGNOSIS — I2510 Atherosclerotic heart disease of native coronary artery without angina pectoris: Secondary | ICD-10-CM

## 2020-03-30 DIAGNOSIS — I4510 Unspecified right bundle-branch block: Secondary | ICD-10-CM

## 2020-03-30 LAB — ECHOCARDIOGRAM COMPLETE
Area-P 1/2: 2.8 cm2
S' Lateral: 3.1 cm

## 2020-03-31 ENCOUNTER — Encounter: Payer: Self-pay | Admitting: Pulmonary Disease

## 2020-04-02 ENCOUNTER — Encounter: Payer: Self-pay | Admitting: Endocrinology

## 2020-05-13 ENCOUNTER — Encounter: Payer: Self-pay | Admitting: Endocrinology

## 2020-05-16 ENCOUNTER — Ambulatory Visit (INDEPENDENT_AMBULATORY_CARE_PROVIDER_SITE_OTHER): Payer: Medicare Other | Admitting: Endocrinology

## 2020-05-16 ENCOUNTER — Other Ambulatory Visit: Payer: Self-pay

## 2020-05-16 ENCOUNTER — Encounter: Payer: Self-pay | Admitting: Endocrinology

## 2020-05-16 VITALS — BP 120/60 | HR 79 | Ht 68.0 in | Wt 147.2 lb

## 2020-05-16 DIAGNOSIS — E119 Type 2 diabetes mellitus without complications: Secondary | ICD-10-CM

## 2020-05-16 LAB — POCT GLYCOSYLATED HEMOGLOBIN (HGB A1C): Hemoglobin A1C: 6.4 % — AB (ref 4.0–5.6)

## 2020-05-16 MED ORDER — INSULIN LISPRO (1 UNIT DIAL) 100 UNIT/ML (KWIKPEN): PEN_INJECTOR | SUBCUTANEOUS | 11 refills | Status: DC

## 2020-05-16 NOTE — Progress Notes (Signed)
Subjective:    Patient ID: David Maynard, male    DOB: 05-19-1950, 70 y.o.   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   Dx'ed: 2008 Complications: none.   Therapy: insulin since 2021, Victoza, and 3 oral meds.   DKA: never.  Severe hypoglycemia: never.  Pancreatitis: never.  Other: he has never taken insulin, except with steroids.  Interval history: pt states he feels well in general.  He takes Humalog, 3 times a day (just before each meal), 3-7-9 units.  I reviewed continuous glucose monitor data.  Glucose varies from 68-210.  It is in general highest at 1AM, and lowest at 4 PM.  He is off nateglinide and repaglinide.  Past Medical History:  Diagnosis Date  . Anemia    history of anemia  . 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 Coronary artery disease   . Depressive disorder, not elsewhere classified    none  . Diabetes mellitus type II   . Hypercholesterolemia   . Hypogonadism male   . Primary localized osteoarthritis of left knee 12/11/2017  . Primary localized osteoarthritis of right knee 10/23/2017  . 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 Thyroid disease   . Unspecified hypothyroidism     Past Surgical History:  Procedure Laterality Date  . ESOPHAGOGASTRODUODENOSCOPY  06/16/2003  . INGUINAL HERNIA REPAIR     left  . JOINT REPLACEMENT    . KNEE ARTHROSCOPY     Left  . KNEE ARTHROSCOPY W/ ACL RECONSTRUCTION     left  . ROTATOR CUFF REPAIR     Right x2  . Stress Cardiolite  07/15/2000  . TOTAL KNEE ARTHROPLASTY Right 11/04/2017  . TOTAL KNEE ARTHROPLASTY Right 11/04/2017   Procedure: TOTAL KNEE ARTHROPLASTY;  Surgeon: Salvatore Marvel, MD;  Location: [facility] OR;  Service: Orthopedics;  Laterality: Right;  . TOTAL KNEE ARTHROPLASTY Left 12/23/2017  . TOTAL KNEE ARTHROPLASTY Left 12/23/2017   Procedure: TOTAL KNEE ARTHROPLASTY;   Surgeon: Salvatore Marvel, MD;  Location: [facility] OR;  Service: Orthopedics;  Laterality: Left;  Marland Kitchen VASECTOMY  05/2002    Social History   Socioeconomic History  . Marital status: Married    Spouse name: Not on file  . Number of children: 2  . Years of education: Not on file  . Highest education level: Not on file  Occupational History  . Occupation: Retired Water engineer for [employer]  Tobacco Use  . Smoking status: Never Smoker  . Smokeless tobacco: Never Used  Vaping Use  . Vaping Use: Never used  Substance and Sexual Activity  . Alcohol use: Yes    Alcohol/week: 1.0 standard drink    Types: 1 Cans of beer per week    Comment: weekly  . Drug use: No  . Sexual activity: Not on file  Other Topics Concern  . Not on file  Social History Narrative  . Not on file   Social Determinants of Health   Financial Resource Strain: Low Risk   . Difficulty of Paying Living Expenses: Not hard at all  Food Insecurity: No Food Insecurity  . Worried About Programme researcher, broadcasting/film/video in the Last Year: Never true  . Ran Out of Food in the Last Year: Never true  Transportation Needs: No Transportation Needs  . Lack of Transportation (Medical): No  .  Lack of Transportation (Non-Medical): No  Physical Activity: Insufficiently Active  . Days of Exercise per Week: 5 days  . Minutes of Exercise per Session: 20 min  Stress: No Stress Concern Present  . Feeling of Stress : Not at all  Social Connections: Socially Integrated  . Frequency of Communication with Friends and Family: More than three times a week  . Frequency of Social Gatherings with Friends and Family: More than three times a week  . Attends Religious Services: More than 4 times per year  . Active Member of Clubs or Organizations: Yes  . Attends Banker Meetings: 1 to 4 times per year  . Marital Status: Married  Catering manager Violence: Not At Risk  . Fear of Current or Ex-Partner: No  . Emotionally Abused: No  . Physically  Abused: No  . Sexually Abused: No    Current Outpatient Medications on File Prior to Visit  Medication Sig Dispense Refill  . ACCU-CHEK GUIDE test strip USE TO CHECK BLOOD SUGAR 6  TIMES DAILY 600 strip 3  . amoxicillin (AMOXIL) 500 MG tablet TAKE FOUR TABLETS BY MOUTH 1 HOUR BEFORE DENTAL WORK 20 tablet 1  . ascorbic acid (VITAMIN C) 500 MG tablet Take 1 tablet (500 mg total) by mouth daily. 20 tablet 0  . atorvastatin (LIPITOR) 40 MG tablet Take 1 tablet (40 mg total) by mouth daily. 90 tablet 3  . Calcium Carbonate (CALCIUM 600 PO) Take 1 tablet by mouth daily.    . celecoxib (CELEBREX) 200 MG capsule Take 1 capsule (200 mg total) by mouth 2 (two) times daily. 30 capsule 0  . Cinnamon 500 MG capsule Take 3,000 mg by mouth 2 (two) times daily.    . clomiPHENE (CLOMID) 50 MG tablet Take 0.5 tablets (25 mg total) by mouth daily. 45 tablet 3  . colesevelam (WELCHOL) 625 MG tablet TAKE 4 TABLETS BY MOUTH  DAILY 360 tablet 3  . Continuous Blood Gluc Sensor (DEXCOM G6 SENSOR) MISC 1 Device by Does not apply route See admin instructions. 1 every 10 days 9 each 3  . docusate sodium (COLACE) 100 MG capsule Take 100 mg by mouth 2 (two) times daily.    . Ferrous Sulfate Dried 45 MG TBCR Take 1 tablet by mouth 2 (two) times daily.    . Glucosamine-Chondroit-Vit C-Mn (GLUCOSAMINE CHONDR 1500 COMPLX PO) Take 2 tablets by mouth daily.    . Insulin Pen Needle 31G X 5 MM MISC 1 Device by Does not apply route in the morning and at bedtime. 200 each 3  . levothyroxine (SYNTHROID) 75 MCG tablet TAKE 1 TABLET BY MOUTH  DAILY 90 tablet 3  . liraglutide (VICTOZA) 18 MG/3ML SOPN Inject 0.3 mLs (1.8 mg total) into the skin every morning. 27 mL 3  . metFORMIN (GLUCOPHAGE-XR) 500 MG 24 hr tablet Take 2 tablets (1,000 mg total) by mouth 2 (two) times daily. 20 tablet 0  . mirabegron ER (MYRBETRIQ) 25 MG TB24 tablet Take 50 mg by mouth daily.    . Multiple Vitamin (MULTIVITAMIN) tablet Take 1 tablet by mouth daily.     Marland Kitchen omega-3 acid ethyl esters (LOVAZA) 1 g capsule TAKE 2 CAPSULES BY MOUTH  TWICE DAILY 360 capsule 3  . omeprazole (PRILOSEC) 40 MG capsule TAKE 1 CAPSULE BY MOUTH  DAILY 90 capsule 3  . tadalafil (ADCIRCA/CIALIS) 20 MG tablet Take 20 mg by mouth daily as needed for erectile dysfunction.    Marland Kitchen tiZANidine (ZANAFLEX) 4 MG tablet Take  0.5-1 tablets (2-4 mg total) by mouth every 6 (six) hours as needed for muscle spasms. 30 tablet 0  . Trospium Chloride 60 MG CP24     . Turmeric 500 MG CAPS Take 1,500 mg by mouth daily.     No current facility-administered medications on file prior to visit.    No Known Allergies  Family History  Problem Relation Age of Onset  . Hypertension Mother   . Kidney disease Father   . Breast cancer Neg Hx   . Celiac disease Neg Hx   . Cirrhosis Neg Hx   . Clotting disorder Neg Hx   . Colitis Neg Hx   . Colon cancer Neg Hx   . Colon polyps Neg Hx   . Crohn's disease Neg Hx   . Cystic fibrosis Neg Hx   . Diabetes Neg Hx   . Esophageal cancer Neg Hx   . Heart disease Neg Hx   . Hemochromatosis Neg Hx   . Inflammatory bowel disease Neg Hx   . Irritable bowel syndrome Neg Hx   . Liver cancer Neg Hx   . Liver disease Neg Hx   . Ovarian cancer Neg Hx   . Pancreatic cancer Neg Hx   . Prostate cancer Neg Hx   . Rectal cancer Neg Hx   . Stomach cancer Neg Hx   . Ulcerative colitis Neg Hx   . Uterine cancer Neg Hx   . Wilson's disease Neg Hx     BP 120/60 (BP Location: Right Arm, Patient Position: Sitting, Cuff Size: Normal)   Pulse 79   Ht 5\' 8"  (1.727 m)   Wt 147 lb 3.2 oz (66.8 kg)   SpO2 98%   BMI 22.38 kg/m    Review of Systems     Objective:   Physical Exam VITAL SIGNS:  See vs page GENERAL: no distress Pulses: dorsalis pedis intact bilat.   MSK: no deformity of the feet CV: 1+ bilat leg edema, and bilat vv's.   Skin:  no ulcer on the feet.  normal color and temp on the feet.   Neuro: sensation is intact to touch on the feet.    Lab  Results  Component Value Date   HGBA1C 6.4 (A) 05/16/2020   Lab Results  Component Value Date   TSH 2.74 02/23/2020       Assessment & Plan:  Insulin-requiring type 2 DM: well-controlled Hypoglycemia, due to insulin: we'll reduce non-insulin rx, to see if this helps.    Patient Instructions  Please stop taking the bromocriptine, and continue the same other medications.   Please come back for a follow-up appointment in 3 months.

## 2020-05-16 NOTE — Patient Instructions (Signed)
Please stop taking the bromocriptine, and continue the same other medications.   Please come back for a follow-up appointment in 3 months.

## 2020-06-07 ENCOUNTER — Encounter: Payer: Self-pay | Admitting: Gastroenterology

## 2020-06-10 ENCOUNTER — Other Ambulatory Visit: Payer: Self-pay | Admitting: Endocrinology

## 2020-06-16 ENCOUNTER — Encounter: Payer: Self-pay | Admitting: Family Medicine

## 2020-06-16 ENCOUNTER — Telehealth (INDEPENDENT_AMBULATORY_CARE_PROVIDER_SITE_OTHER): Payer: Medicare Other | Admitting: Family Medicine

## 2020-06-16 VITALS — BP 127/78

## 2020-06-16 DIAGNOSIS — R0981 Nasal congestion: Secondary | ICD-10-CM | POA: Diagnosis not present

## 2020-06-16 DIAGNOSIS — R059 Cough, unspecified: Secondary | ICD-10-CM

## 2020-06-16 NOTE — Progress Notes (Signed)
Virtual Visit via Video Note  I connected with David Maynard  on 06/16/20 at 10:20 AM EDT by a video enabled telemedicine application and verified that I am speaking with the correct person using two identifiers.  Location patient: home, [place] Location provider:work or home office Persons participating in the virtual visit: patient, provider  I discussed the limitations of evaluation and management by telemedicine and the availability of in person appointments. The patient expressed understanding and agreed to proceed.   HPI:  Acute telemedicine visit for cough and congestion: -Onset:9-10 days ago - cleaned out attic the day before -Symptoms include: scratchy throat, nasal congestion, cough -now improving -he did a covid test the day the symptoms started which was negative -Denies: fevers, CP, NVD, SOB, inability to eat/drink/get out of bed -wife whom is a PA checked his pulse, BP and lungs and reported they were ok -no known sick contacts -Has tried: OTC daytime cold medication with tylenol and decongestant and nyquil, musinex -Pertinent past medical history: had covid last year, see below -Pertinent medication allergies: No Known Allergies -COVID-19 vaccine status: fully vaccinated and had booster  ROS: See pertinent positives and negatives per HPI.  Past Medical History:  Diagnosis Date  . Anemia    history of anemia  . 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 Coronary artery disease   . Depressive disorder, not elsewhere classified    none  . Diabetes mellitus type II   . Hypercholesterolemia   . Hypogonadism male   . Primary localized osteoarthritis of left knee 12/11/2017  . Primary localized osteoarthritis of right knee 10/23/2017  . 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 Thyroid disease   . Unspecified hypothyroidism     Past Surgical History:  Procedure Laterality Date  . ESOPHAGOGASTRODUODENOSCOPY  06/16/2003  . INGUINAL HERNIA REPAIR     left  . JOINT REPLACEMENT     . KNEE ARTHROSCOPY     Left  . KNEE ARTHROSCOPY W/ ACL RECONSTRUCTION     left  . ROTATOR CUFF REPAIR     Right x2  . Stress Cardiolite  07/15/2000  . TOTAL KNEE ARTHROPLASTY Right 11/04/2017  . TOTAL KNEE ARTHROPLASTY Right 11/04/2017   Procedure: TOTAL KNEE ARTHROPLASTY;  Surgeon: Salvatore Marvel, MD;  Location: [facility] OR;  Service: Orthopedics;  Laterality: Right;  . TOTAL KNEE ARTHROPLASTY Left 12/23/2017  . TOTAL KNEE ARTHROPLASTY Left 12/23/2017   Procedure: TOTAL KNEE ARTHROPLASTY;  Surgeon: Salvatore Marvel, MD;  Location: [facility] OR;  Service: Orthopedics;  Laterality: Left;  Marland Kitchen VASECTOMY  05/2002     Current Outpatient Medications:  .  ACCU-CHEK GUIDE test strip, USE TO CHECK BLOOD SUGAR 6  TIMES DAILY, Disp: 600 strip, Rfl: 3 .  amoxicillin (AMOXIL) 500 MG tablet, TAKE FOUR TABLETS BY MOUTH 1 HOUR BEFORE DENTAL WORK, Disp: 20 tablet, Rfl: 1 .  ascorbic acid (VITAMIN C) 500 MG tablet, Take 1 tablet (500 mg total) by mouth daily., Disp: 20 tablet, Rfl: 0 .  atorvastatin (LIPITOR) 40 MG tablet, Take 1 tablet (40 mg total) by mouth daily., Disp: 90 tablet, Rfl: 3 .  Calcium Carbonate (CALCIUM 600 PO), Take 1 tablet by mouth daily., Disp: , Rfl:  .  Cinnamon 500 MG capsule, Take 3,000 mg by mouth 2 (two) times daily., Disp: , Rfl:  .  clomiPHENE (CLOMID) 50 MG tablet, Take 0.5 tablets (25 mg total) by mouth daily., Disp: 45 tablet, Rfl: 3 .  colesevelam (WELCHOL) 625 MG tablet, TAKE 4 TABLETS BY  MOUTH  DAILY, Disp: 360 tablet, Rfl: 3 .  Continuous Blood Gluc Sensor (DEXCOM G6 SENSOR) MISC, 1 Device by Does not apply route See admin instructions. 1 every 10 days, Disp: 9 each, Rfl: 3 .  docusate sodium (COLACE) 100 MG capsule, Take 100 mg by mouth 2 (two) times daily., Disp: , Rfl:  .  Ferrous Sulfate Dried 45 MG TBCR, Take 1 tablet by mouth 2 (two) times daily., Disp: , Rfl:  .  Glucosamine-Chondroit-Vit C-Mn (GLUCOSAMINE CHONDR 1500 COMPLX PO), Take 2 tablets by mouth daily., Disp: , Rfl:  .   insulin lispro (HUMALOG KWIKPEN) 100 UNIT/ML KwikPen, 3 times a day (just before each meal), 3-7-9 units, and pen needles 3/day, Disp: 30 mL, Rfl: 11 .  levothyroxine (SYNTHROID) 75 MCG tablet, TAKE 1 TABLET BY MOUTH  DAILY, Disp: 90 tablet, Rfl: 3 .  liraglutide (VICTOZA) 18 MG/3ML SOPN, Inject 0.3 mLs (1.8 mg total) into the skin every morning., Disp: 27 mL, Rfl: 3 .  metFORMIN (GLUCOPHAGE-XR) 500 MG 24 hr tablet, Take 2 tablets (1,000 mg total) by mouth 2 (two) times daily., Disp: 20 tablet, Rfl: 0 .  Multiple Vitamin (MULTIVITAMIN) tablet, Take 1 tablet by mouth daily., Disp: , Rfl:  .  MYRBETRIQ 50 MG TB24 tablet, Take 50 mg by mouth daily., Disp: , Rfl:  .  omega-3 acid ethyl esters (LOVAZA) 1 g capsule, TAKE 2 CAPSULES BY MOUTH  TWICE DAILY, Disp: 360 capsule, Rfl: 3 .  omeprazole (PRILOSEC) 40 MG capsule, TAKE 1 CAPSULE BY MOUTH  DAILY, Disp: 90 capsule, Rfl: 3 .  tadalafil (ADCIRCA/CIALIS) 20 MG tablet, Take 20 mg by mouth daily as needed for erectile dysfunction., Disp: , Rfl:  .  Trospium Chloride 60 MG CP24, , Disp: , Rfl:  .  Turmeric 500 MG CAPS, Take 1,500 mg by mouth daily., Disp: , Rfl:  .  Insulin Pen Needle 31G X 5 MM MISC, 1 Device by Does not apply route in the morning and at bedtime., Disp: 200 each, Rfl: 3  EXAM:  VITALS per patient if applicable:  GENERAL: alert, oriented, appears well and in no acute distress  HEENT: atraumatic, conjunttiva clear, no obvious abnormalities on inspection of external nose and ears  NECK: normal movements of the head and neck  LUNGS: on inspection no signs of respiratory distress, breathing rate appears normal, no obvious gross SOB, gasping or wheezing  CV: no obvious cyanosis  MS: moves all visible extremities without noticeable abnormality  PSYCH/NEURO: pleasant and cooperative, no obvious depression or anxiety, speech and thought processing grossly intact  ASSESSMENT AND PLAN:  Discussed the following assessment and  plan:  Nasal congestion  Cough  -we discussed possible serious and likely etiologies, options for evaluation and workup, limitations of telemedicine visit vs in person visit, treatment, treatment risks and precautions. Pt prefers to treat via telemedicine empirically rather than in person at this moment. Query VURI vs other. Discussed possibility of false negative covid test. Since improving and no alarm systems opted for symptomatic care with musinex and other measures summarized in patient instructions. Scheduled follow up with PCP offered: he opted to follow up as needed. Advised to seek prompt in person care if worsening, new symptoms arise, or if is not improving with treatment. Discussed options for inperson care if PCP office not available.    I discussed the assessment and treatment plan with the patient. The patient was provided an opportunity to ask questions and all were answered. The patient agreed with  the plan and demonstrated an understanding of the instructions.     Lucretia Kern, DO

## 2020-06-16 NOTE — Patient Instructions (Signed)
  HOME CARE TIPS:   -can use tylenol  if needed for fevers, aches and pains per instructions  -can use nasal saline a few times per day if you have nasal congestion; sometimes  a short course of Afrin nasal spray for 3 days can help with symptoms as well  -stay hydrated, drink plenty of fluids and eat small healthy meals - avoid dairy  -can take 1000 IU ( ) Vit D3 and 100-500 mg of Vit C daily per instructions  -use the Musinex for cough as needed per instructions  -follow up with your doctor in 2-3 days unless improving and feeling better  It was nice to meet you today, and I really hope you are feeling better soon. I help [facility] out with telemedicine visits on Tuesdays and Thursdays and am available for visits on those days. If you have any concerns or questions following this visit please schedule a follow up visit with your Primary Care doctor or seek care at a local urgent care clinic to avoid delays in care.    Seek in person care or schedule a follow up video visit promptly if your symptoms worsen, new concerns arise or you are not improving with treatment. Call 911 and/or seek emergency care if your symptoms are severe or life threatening.

## 2020-07-12 ENCOUNTER — Encounter: Payer: Self-pay | Admitting: Endocrinology

## 2020-07-13 NOTE — Telephone Encounter (Signed)
Patient called re: Per Patient, Patient sent MyChart message on 07/12/20 re: Increasing dosage of  insulin lispro (HUMALOG KWIKPEN) 100 UNIT/ML KwikPen to 1 Pen per week.   Patient requests a new RX for 13 Pens (90 days=13 weeks) of  insulin lispro (HUMALOG KWIKPEN) 100 UNIT/ML KwikPen   with 3 refills be sent to: [facility].  Patient states if any questions or concerns, Patient requests to be called at ph# [phone] or send MyChart message to Patient.

## 2020-07-26 ENCOUNTER — Other Ambulatory Visit: Payer: Self-pay | Admitting: Family Medicine

## 2020-07-26 ENCOUNTER — Other Ambulatory Visit: Payer: Self-pay | Admitting: Endocrinology

## 2020-07-26 DIAGNOSIS — E119 Type 2 diabetes mellitus without complications: Secondary | ICD-10-CM

## 2020-07-29 ENCOUNTER — Encounter: Payer: Self-pay | Admitting: Endocrinology

## 2020-07-31 ENCOUNTER — Other Ambulatory Visit: Payer: Self-pay

## 2020-07-31 DIAGNOSIS — E119 Type 2 diabetes mellitus without complications: Secondary | ICD-10-CM

## 2020-07-31 MED ORDER — VICTOZA 18 MG/3ML ~~LOC~~ SOPN: 1.8000 mg | PEN_INJECTOR | SUBCUTANEOUS | 3 refills | Status: DC

## 2020-07-31 MED ORDER — METFORMIN HCL ER 500 MG PO TB24: 1000.0000 mg | ORAL_TABLET | Freq: Two times a day (BID) | ORAL | 0 refills | Status: DC

## 2020-07-31 MED ORDER — INSULIN PEN NEEDLE 31G X 5 MM MISC: 1.0000 | Freq: Two times a day (BID) | 3 refills | Status: AC

## 2020-08-01 ENCOUNTER — Telehealth: Payer: Self-pay

## 2020-08-01 ENCOUNTER — Other Ambulatory Visit: Payer: Self-pay | Admitting: Endocrinology

## 2020-08-01 DIAGNOSIS — E119 Type 2 diabetes mellitus without complications: Secondary | ICD-10-CM

## 2020-08-01 MED ORDER — PIOGLITAZONE HCL 45 MG PO TABS: 45.0000 mg | ORAL_TABLET | Freq: Every day | ORAL | 3 refills | Status: DC

## 2020-08-01 NOTE — Telephone Encounter (Signed)
Pt is requesting a refill on Actos but it is not active on pt's med list.  Please Advise

## 2020-08-02 ENCOUNTER — Encounter: Payer: Self-pay | Admitting: Endocrinology

## 2020-08-02 ENCOUNTER — Other Ambulatory Visit: Payer: Self-pay

## 2020-08-02 DIAGNOSIS — E119 Type 2 diabetes mellitus without complications: Secondary | ICD-10-CM

## 2020-08-02 MED ORDER — PIOGLITAZONE HCL 45 MG PO TABS: 45.0000 mg | ORAL_TABLET | Freq: Every day | ORAL | 3 refills | Status: DC

## 2020-08-02 MED ORDER — METFORMIN HCL ER 500 MG PO TB24: 1000.0000 mg | ORAL_TABLET | Freq: Two times a day (BID) | ORAL | 0 refills | Status: DC

## 2020-08-02 MED ORDER — VICTOZA 18 MG/3ML ~~LOC~~ SOPN: 1.8000 mg | PEN_INJECTOR | SUBCUTANEOUS | 3 refills | Status: DC

## 2020-08-03 ENCOUNTER — Encounter: Payer: Self-pay | Admitting: Gastroenterology

## 2020-08-12 ENCOUNTER — Encounter: Payer: Self-pay | Admitting: Endocrinology

## 2020-08-15 ENCOUNTER — Ambulatory Visit (INDEPENDENT_AMBULATORY_CARE_PROVIDER_SITE_OTHER): Payer: Medicare Other | Admitting: Endocrinology

## 2020-08-15 ENCOUNTER — Other Ambulatory Visit: Payer: Self-pay

## 2020-08-15 ENCOUNTER — Encounter: Payer: Self-pay | Admitting: Endocrinology

## 2020-08-15 VITALS — BP 144/64 | HR 85 | Ht 68.0 in | Wt 146.0 lb

## 2020-08-15 DIAGNOSIS — E119 Type 2 diabetes mellitus without complications: Secondary | ICD-10-CM

## 2020-08-15 LAB — POCT GLYCOSYLATED HEMOGLOBIN (HGB A1C): Hemoglobin A1C: 6.4 % — AB (ref 4.0–5.6)

## 2020-08-15 MED ORDER — INSULIN LISPRO (1 UNIT DIAL) 100 UNIT/ML (KWIKPEN)
PEN_INJECTOR | SUBCUTANEOUS | 11 refills | Status: DC
Start: 2020-08-15 — End: 2020-08-17

## 2020-08-15 MED ORDER — PIOGLITAZONE HCL 30 MG PO TABS: 30.0000 mg | ORAL_TABLET | Freq: Every day | ORAL | 3 refills | Status: DC

## 2020-08-15 NOTE — Progress Notes (Signed)
Subjective:    Patient ID: David Maynard, male    DOB: 01/28/1950, 70 y.o.   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   Dx'ed: 2008 Complications: none.   Therapy: insulin since 2021, Victoza, and 3 oral meds.   DKA: never.  Severe hypoglycemia: never.  Pancreatitis: never.  Other: he takes multiple daily injections.   Interval history: pt states he feels well in general.  He takes Humalog, 3 times a day (just before each meal), 4-7-8 units.  I reviewed continuous glucose monitor data.  Glucose varies from 74-160.  It is in general highest at 8AM and 10PM, but there is little trend throughout the day.  He has mild hypoglycemia 1-2 times per week.   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   Thyroid disease    Unspecified hypothyroidism     Past Surgical History:  Procedure Laterality Date   ESOPHAGOGASTRODUODENOSCOPY  06/16/2003   INGUINAL HERNIA REPAIR     left   JOINT REPLACEMENT     KNEE ARTHROSCOPY     Left   KNEE ARTHROSCOPY W/ ACL RECONSTRUCTION     left   ROTATOR CUFF REPAIR     Right x2   Stress Cardiolite  07/15/2000   TOTAL KNEE ARTHROPLASTY Right 11/04/2017   TOTAL KNEE ARTHROPLASTY Right 11/04/2017   Procedure: TOTAL KNEE ARTHROPLASTY;  Surgeon: Salvatore Marvel, MD;  Location: [facility] OR;  Service: Orthopedics;  Laterality: Right;   TOTAL KNEE ARTHROPLASTY Left 12/23/2017   TOTAL KNEE ARTHROPLASTY Left 12/23/2017   Procedure: TOTAL KNEE  ARTHROPLASTY;  Surgeon: Salvatore Marvel, MD;  Location: [facility] OR;  Service: Orthopedics;  Laterality: Left;   VASECTOMY  05/2002    Social History   Socioeconomic History   Marital status: Married    Spouse name: Not on file   Number of children: 2   Years of education: Not on file   Highest education level: Not on file  Occupational History   Occupation: Retired Water engineer for [employer]  Tobacco Use   Smoking status: Never   Smokeless tobacco: Never  Vaping Use   Vaping Use: Never used  Substance and Sexual Activity   Alcohol use: Yes    Alcohol/week: 1.0 standard drink    Types: 1 Cans of beer per week    Comment: weekly   Drug use: No   Sexual activity: Not on file  Other Topics Concern   Not on file  Social History Narrative   Not on file   Social Determinants of Health   Financial Resource Strain: Low Risk    Difficulty of Paying Living Expenses: Not hard at all  Food Insecurity: No Food Insecurity   Worried About Programme researcher, broadcasting/film/video in the Last Year: Never true   Ran Out of Food in the Last Year: Never true  Transportation Needs: No Transportation Needs   Lack of  Transportation (Medical): No   Lack of Transportation (Non-Medical): No  Physical Activity: Insufficiently Active   Days of Exercise per Week: 5 days   Minutes of Exercise per Session: 20 min  Stress: No Stress Concern Present   Feeling of Stress : Not at all  Social Connections: Socially Integrated   Frequency of Communication with Friends and Family: More than three times a week   Frequency of Social Gatherings with Friends and Family: More than three times a week   Attends Religious Services: More than 4 times per year   Active Member of [employer] or Organizations: Yes   Attends Banker Meetings: 1 to 4 times per year   Marital Status: Married  Catering manager Violence: Not At Risk   Fear of Current or Ex-Partner: No   Emotionally Abused: No   Physically Abused: No   Sexually  Abused: No    Current Outpatient Medications on File Prior to Visit  Medication Sig Dispense Refill   ACCU-CHEK GUIDE test strip USE TO CHECK BLOOD SUGAR 6  TIMES DAILY 600 strip 3   amoxicillin (AMOXIL) 500 MG tablet TAKE FOUR TABLETS BY MOUTH 1 HOUR BEFORE DENTAL WORK 20 tablet 1   ascorbic acid (VITAMIN C) 500 MG tablet Take 1 tablet (500 mg total) by mouth daily. 20 tablet 0   atorvastatin (LIPITOR) 40 MG tablet Take 1 tablet (40 mg total) by mouth daily. 90 tablet 3   Calcium Carbonate (CALCIUM 600 PO) Take 1 tablet by mouth daily.     Cinnamon 500 MG capsule Take 3,000 mg by mouth 2 (two) times daily.     clomiPHENE (CLOMID) 50 MG tablet TAKE ONE-HALF TABLET BY  MOUTH DAILY 45 tablet 3   colesevelam (WELCHOL) 625 MG tablet TAKE 4 TABLETS BY MOUTH  DAILY 360 tablet 3   Continuous Blood Gluc Sensor (DEXCOM G6 SENSOR) MISC 1 Device by Does not apply route See admin instructions. 1 every 10 days 9 each 3   docusate sodium (COLACE) 100 MG capsule Take 100 mg by mouth 2 (two) times daily.     Ferrous Sulfate Dried 45 MG TBCR Take 1 tablet by mouth 2 (two) times daily.     Glucosamine-Chondroit-Vit C-Mn (GLUCOSAMINE CHONDR 1500 COMPLX PO) Take 2 tablets by mouth daily.     Insulin Pen Needle 31G X 5 MM MISC 1 Device by Does not apply route in the morning and at bedtime. 200 each 3   levothyroxine (SYNTHROID) 75 MCG tablet TAKE 1 TABLET BY MOUTH  DAILY 90 tablet 3   liraglutide (VICTOZA) 18 MG/3ML SOPN Inject 1.8 mg into the skin every morning. 27 mL 3   metFORMIN (GLUCOPHAGE-XR) 500 MG 24 hr tablet Take 2 tablets (1,000 mg total) by mouth 2 (two) times daily. 20 tablet 0   Multiple Vitamin (MULTIVITAMIN) tablet Take 1 tablet by mouth daily.     MYRBETRIQ 50 MG TB24 tablet Take 50 mg by mouth daily.     omega-3 acid ethyl esters (LOVAZA) 1 g capsule TAKE 2 CAPSULES BY MOUTH  TWICE DAILY 360 capsule 3   omeprazole (PRILOSEC) 40 MG capsule TAKE 1 CAPSULE BY MOUTH  DAILY 90 capsule 3   tadalafil  (ADCIRCA/CIALIS) 20 MG tablet Take 20 mg by mouth daily as needed for erectile dysfunction.     Trospium Chloride 60 MG CP24      Turmeric 500 MG CAPS Take 1,500 mg by mouth daily.     No current facility-administered medications on file  prior to visit.    No Known Allergies  Family History  Problem Relation Age of Onset   Hypertension Mother    Kidney disease Father    Breast cancer Neg Hx    Celiac disease Neg Hx    Cirrhosis Neg Hx    Clotting disorder Neg Hx    Colitis Neg Hx    Colon cancer Neg Hx    Colon polyps Neg Hx    Crohn's disease Neg Hx    Cystic fibrosis Neg Hx    Diabetes Neg Hx    Esophageal cancer Neg Hx    Heart disease Neg Hx    Hemochromatosis Neg Hx    Inflammatory bowel disease Neg Hx    Irritable bowel syndrome Neg Hx    Liver cancer Neg Hx    Liver disease Neg Hx    Ovarian cancer Neg Hx    Pancreatic cancer Neg Hx    Prostate cancer Neg Hx    Rectal cancer Neg Hx    Stomach cancer Neg Hx    Ulcerative colitis Neg Hx    Uterine cancer Neg Hx    Wilson's disease Neg Hx     BP (!) 144/64 (BP Location: Right Arm, Patient Position: Sitting, Cuff Size: Normal)   Pulse 85   Ht 5\' 8"  (1.727 m)   Wt 146 lb (66.2 kg)   SpO2 96%   BMI 22.20 kg/m    Review of Systems     Objective:   Physical Exam Pulses: dorsalis pedis intact bilat.   MSK: no deformity of the feet CV: 1+ bilat leg edema, and bilat vv's.   Skin:  no ulcer on the feet.  normal color and temp on the feet.   Neuro: sensation is intact to touch on the feet.    Lab Results  Component Value Date   HGBA1C 6.4 (A) 08/15/2020      Assessment & Plan:  Insulin-requiring type 2 DM. Hypoglycemia, due to insulin: The pattern of his cbg's indicates he needs some adjustment in his therapy Edema; due to pioglitazone.   Patient Instructions  Your blood pressure is high today.  Please see your primary care provider soon, to have it rechecked.   Please continue the same insulin. I  have sent a prescription to your pharmacy, to reduce the pioglitazone. Please continue the same other medications.   Please come back for a follow-up appointment in 3 months.

## 2020-08-15 NOTE — Patient Instructions (Addendum)
Your blood pressure is high today.  Please see your primary care provider soon, to have it rechecked.   Please continue the same insulin. I have sent a prescription to your pharmacy, to reduce the pioglitazone. Please continue the same other medications.   Please come back for a follow-up appointment in 3 months.

## 2020-08-16 ENCOUNTER — Encounter: Payer: Self-pay | Admitting: Endocrinology

## 2020-08-17 ENCOUNTER — Other Ambulatory Visit: Payer: Self-pay

## 2020-08-17 DIAGNOSIS — E119 Type 2 diabetes mellitus without complications: Secondary | ICD-10-CM

## 2020-08-17 MED ORDER — INSULIN LISPRO (1 UNIT DIAL) 100 UNIT/ML (KWIKPEN): PEN_INJECTOR | SUBCUTANEOUS | 11 refills | Status: DC

## 2020-08-19 ENCOUNTER — Other Ambulatory Visit: Payer: Self-pay | Admitting: Endocrinology

## 2020-08-21 ENCOUNTER — Encounter: Payer: Self-pay | Admitting: Endocrinology

## 2020-08-22 NOTE — Telephone Encounter (Signed)
Patient requests to be called at ph# [phone]  re: RX for Humalog Stephanie Coup was originally sent to [facility] instead of [facility] (Please see MyChart message below). Patient spoke to [facility] on 08/21/20 who told Patient they have received the RX however, due to RX for the above medication being sent to [facility] first, [facility] is not able to fill the above RX until 10/19/20. [facility] cost of the above RX is $105.00 vs. [facility] cost is $70.00. Patient requests to be advised as to what to do re: the above message.

## 2020-08-23 ENCOUNTER — Other Ambulatory Visit: Payer: Self-pay

## 2020-08-23 DIAGNOSIS — E119 Type 2 diabetes mellitus without complications: Secondary | ICD-10-CM

## 2020-08-23 MED ORDER — INSULIN LISPRO (1 UNIT DIAL) 100 UNIT/ML (KWIKPEN): PEN_INJECTOR | SUBCUTANEOUS | 11 refills | Status: DC

## 2020-08-23 NOTE — Telephone Encounter (Signed)
I LVM for pt to let him know that I contacted [facility] to cancel the Humalog and the prescription was resent to [facility]. He may contact them to see if they will be able to fill

## 2020-08-25 NOTE — Telephone Encounter (Signed)
I have spoke with the pharmacy regarding there question with humalog.

## 2020-08-26 ENCOUNTER — Encounter: Payer: Self-pay | Admitting: Endocrinology

## 2020-08-30 ENCOUNTER — Other Ambulatory Visit: Payer: Self-pay | Admitting: Endocrinology

## 2020-08-30 DIAGNOSIS — E119 Type 2 diabetes mellitus without complications: Secondary | ICD-10-CM

## 2020-08-31 ENCOUNTER — Other Ambulatory Visit: Payer: Self-pay

## 2020-08-31 DIAGNOSIS — E119 Type 2 diabetes mellitus without complications: Secondary | ICD-10-CM

## 2020-08-31 MED ORDER — METFORMIN HCL ER 500 MG PO TB24: 1000.0000 mg | ORAL_TABLET | Freq: Two times a day (BID) | ORAL | 3 refills | Status: DC

## 2020-08-31 MED ORDER — PIOGLITAZONE HCL 30 MG PO TABS: 30.0000 mg | ORAL_TABLET | Freq: Every day | ORAL | 3 refills | Status: DC

## 2020-08-31 NOTE — Telephone Encounter (Signed)
Patient called to advise that the RX to [facility] is on hold waiting for correct RX to be sent.  Patient is requesting that the following be sent to [facility]  500 MG Metformin ER - 4 tablets per day 90 days worth with 3 refills Please send to [facility]   Patient states please advise when done -My Chart message OK

## 2020-09-07 ENCOUNTER — Encounter: Payer: Self-pay | Admitting: Endocrinology

## 2020-09-07 DIAGNOSIS — E119 Type 2 diabetes mellitus without complications: Secondary | ICD-10-CM

## 2020-09-08 ENCOUNTER — Ambulatory Visit: Payer: Medicare Other

## 2020-09-09 ENCOUNTER — Other Ambulatory Visit: Payer: Self-pay | Admitting: Endocrinology

## 2020-09-09 MED ORDER — LANTUS SOLOSTAR 100 UNIT/ML ~~LOC~~ SOPN: 5.0000 [IU] | PEN_INJECTOR | SUBCUTANEOUS | 99 refills | Status: DC

## 2020-09-12 ENCOUNTER — Ambulatory Visit (INDEPENDENT_AMBULATORY_CARE_PROVIDER_SITE_OTHER): Payer: Medicare Other

## 2020-09-12 ENCOUNTER — Other Ambulatory Visit: Payer: Self-pay

## 2020-09-12 VITALS — BP 110/62 | HR 80 | Temp 98.2°F | Wt 145.6 lb

## 2020-09-12 DIAGNOSIS — Z0000 Encounter for general adult medical examination without abnormal findings: Secondary | ICD-10-CM | POA: Diagnosis not present

## 2020-09-12 NOTE — Progress Notes (Signed)
Subjective:   David Maynard is a 70 y.o. male who presents for Medicare Annual/Subsequent preventive examination.  Review of Systems     Cardiac Risk Factors include: advanced age (>36men, >28 women);diabetes mellitus;dyslipidemia;male gender     Objective:    Today's Vitals   09/12/20 1111  BP: 110/62  Pulse: 80  Temp: 98.2 F (36.8 C)  SpO2: 95%  Weight: 145 lb 9.6 oz (66 kg)   Body mass index is 22.14 kg/m.  Advanced Directives 09/12/2020 09/28/2019 09/26/2019 09/03/2019 09/02/2018 12/24/2017 12/23/2017  Does Patient Have a Medical Advance Directive? Yes No No Yes Yes Yes -  Type of Diplomatic Services operational officer - - Healthcare Power of Attorney Living will;Healthcare Power of [employer] Power of [place];Living will -  Does patient want to make changes to medical advance directive? - - - - No - Patient declined No - Patient declined -  Copy of Healthcare Power of Attorney in Chart? Yes - validated most recent copy scanned in chart (See row information) - - Yes - validated most recent copy scanned in chart (See row information) Yes - validated most recent copy scanned in chart (See row information) - Yes - validated most recent copy scanned in chart (See row information)  Would patient like information on creating a medical advance directive? - - No - Patient declined - - - -    Current Medications (verified) Outpatient Encounter Medications as of 09/12/2020  Medication Sig   ACCU-CHEK GUIDE test strip USE TO CHECK BLOOD SUGAR 6  TIMES DAILY   ascorbic acid (VITAMIN C) 500 MG tablet Take 1 tablet (500 mg total) by mouth daily.   atorvastatin (LIPITOR) 40 MG tablet Take 1 tablet (40 mg total) by mouth daily.   Calcium Carbonate (CALCIUM 600 PO) Take 1 tablet by mouth daily.   Cinnamon 500 MG capsule Take 3,000 mg by mouth 2 (two) times daily.   clomiPHENE (CLOMID) 50 MG tablet TAKE ONE-HALF TABLET BY  MOUTH DAILY   colesevelam (WELCHOL) 625 MG tablet TAKE  4 TABLETS BY MOUTH  DAILY   Continuous Blood Gluc Sensor (DEXCOM G6 SENSOR) MISC 1 Device by Does not apply route See admin instructions. 1 every 10 days   docusate sodium (COLACE) 100 MG capsule Take 100 mg by mouth 2 (two) times daily.   Ferrous Sulfate Dried 45 MG TBCR Take 1 tablet by mouth 2 (two) times daily.   Glucosamine-Chondroit-Vit C-Mn (GLUCOSAMINE CHONDR 1500 COMPLX PO) Take 2 tablets by mouth daily.   insulin glargine (LANTUS SOLOSTAR) 100 UNIT/ML Solostar Pen Inject 5 Units into the skin every morning.   insulin lispro (HUMALOG KWIKPEN) 100 UNIT/ML KwikPen 3 times a day (just before each meal), 4-7-8 units, and pen needles 3/day   Insulin Pen Needle 31G X 5 MM MISC 1 Device by Does not apply route in the morning and at bedtime.   levothyroxine (SYNTHROID) 75 MCG tablet TAKE 1 TABLET BY MOUTH  DAILY   liraglutide (VICTOZA) 18 MG/3ML SOPN Inject 1.8 mg into the skin every morning.   metFORMIN (GLUCOPHAGE-XR) 500 MG 24 hr tablet Take 2 tablets (1,000 mg total) by mouth 2 (two) times daily.   Multiple Vitamin (MULTIVITAMIN) tablet Take 1 tablet by mouth daily.   MYRBETRIQ 50 MG TB24 tablet Take 50 mg by mouth daily.   omega-3 acid ethyl esters (LOVAZA) 1 g capsule TAKE 2 CAPSULES BY MOUTH  TWICE DAILY   omeprazole (PRILOSEC) 40 MG capsule TAKE 1 CAPSULE  BY MOUTH  DAILY   pioglitazone (ACTOS) 30 MG tablet Take 1 tablet (30 mg total) by mouth daily.   tadalafil (ADCIRCA/CIALIS) 20 MG tablet Take 20 mg by mouth daily as needed for erectile dysfunction.   Trospium Chloride 60 MG CP24    Turmeric 500 MG CAPS Take 1,500 mg by mouth daily.   amoxicillin (AMOXIL) 500 MG tablet TAKE FOUR TABLETS BY MOUTH 1 HOUR BEFORE DENTAL WORK (Patient not taking: Reported on 09/12/2020)   No facility-administered encounter medications on file as of 09/12/2020.    Allergies (verified) Patient has no known allergies.   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   Thyroid disease    Unspecified hypothyroidism    Past Surgical History:  Procedure Laterality Date   ESOPHAGOGASTRODUODENOSCOPY  06/16/2003   INGUINAL HERNIA REPAIR     left   JOINT REPLACEMENT     KNEE ARTHROSCOPY     Left   KNEE ARTHROSCOPY W/ ACL RECONSTRUCTION     left   ROTATOR CUFF REPAIR     Right x2   Stress Cardiolite  07/15/2000   TOTAL KNEE ARTHROPLASTY Right 11/04/2017   TOTAL KNEE ARTHROPLASTY Right 11/04/2017   Procedure: TOTAL KNEE ARTHROPLASTY;  Surgeon: Salvatore Marvel, MD;  Location: [facility] OR;  Service: Orthopedics;  Laterality: Right;   TOTAL KNEE ARTHROPLASTY Left 12/23/2017   TOTAL KNEE ARTHROPLASTY Left 12/23/2017   Procedure: TOTAL KNEE ARTHROPLASTY;  Surgeon: Salvatore Marvel, MD;  Location: [facility] OR;  Service: Orthopedics;  Laterality: Left;   VASECTOMY  05/2002   Family History  Problem Relation Age of Onset   Hypertension Mother    Kidney disease Father    Breast cancer Neg Hx    Celiac disease Neg Hx    Cirrhosis Neg Hx    Clotting disorder Neg Hx    Colitis Neg Hx    Colon cancer Neg Hx    Colon polyps Neg Hx    Crohn's disease Neg Hx    Cystic fibrosis Neg Hx    Diabetes Neg Hx    Esophageal cancer Neg Hx    Heart disease Neg Hx    Hemochromatosis Neg Hx    Inflammatory bowel disease Neg Hx    Irritable bowel syndrome Neg Hx    Liver cancer Neg Hx    Liver disease Neg Hx    Ovarian cancer Neg Hx    Pancreatic cancer Neg Hx    Prostate cancer Neg Hx    Rectal cancer Neg Hx    Stomach cancer Neg Hx    Ulcerative colitis Neg Hx    Uterine cancer Neg Hx    Wilson's disease Neg Hx    Social  History   Socioeconomic History   Marital status: Married    Spouse name: Not on file   Number of children: 2   Years of education: Not on file   Highest education level: Not on file  Occupational History   Occupation: Retired Water engineer for [employer]  Tobacco Use   Smoking status: Never  Smokeless tobacco: Never  Vaping Use   Vaping Use: Never used  Substance and Sexual Activity   Alcohol use: Yes    Alcohol/week: 1.0 standard drink    Types: 1 Cans of beer per week    Comment: weekly   Drug use: No   Sexual activity: Not on file  Other Topics Concern   Not on file  Social History Narrative   Not on file   Social Determinants of Health   Financial Resource Strain: Low Risk    Difficulty of Paying Living Expenses: Not hard at all  Food Insecurity: No Food Insecurity   Worried About Programme researcher, broadcasting/film/videounning Out of Food in the Last Year: Never true   Ran Out of Food in the Last Year: Never true  Transportation Needs: No Transportation Needs   Lack of Transportation (Medical): No   Lack of Transportation (Non-Medical): No  Physical Activity: Sufficiently Active   Days of Exercise per Week: 5 days   Minutes of Exercise per Session: 60 min  Stress: No Stress Concern Present   Feeling of Stress : Not at all  Social Connections: Socially Integrated   Frequency of Communication with Friends and Family: More than three times a week   Frequency of Social Gatherings with Friends and Family: More than three times a week   Attends Religious Services: More than 4 times per year   Active Member of [employer] or Organizations: Yes   Attends BankerClub or Organization Meetings: 1 to 4 times per year   Marital Status: Married    Tobacco Counseling Counseling given: Not Answered   Clinical Intake:  Pre-visit preparation completed: Yes  Pain : No/denies pain     BMI - recorded: 22.14 Nutritional Status: BMI of 19-24  Normal Nutritional Risks: None Diabetes: Yes CBG done?: Yes (137) CBG  resulted in Enter/ Edit results?: No Did pt. bring in CBG monitor from home?: No  How often do you need to have someone help you when you read instructions, pamphlets, or other written materials from your doctor or pharmacy?: 1 - Never  Diabetic?Nutrition Risk Assessment:  Has the patient had any N/V/D within the last 2 months?  No  Does the patient have any non-healing wounds?  No  Has the patient had any unintentional weight loss or weight gain?  No   Diabetes:  Is the patient diabetic?  Yes  If diabetic, was a CBG obtained today?  Yes  Did the patient bring in their glucometer from home?  No  How often do you monitor your CBG's? Daily.   Financial Strains and Diabetes Management:  Are you having any financial strains with the device, your supplies or your medication? No .  Does the patient want to be seen by Chronic Care Management for management of their diabetes?  No  Would the patient like to be referred to a Nutritionist or for Diabetic Management?  No   Diabetic Exams:  Diabetic Eye Exam: Completed 12/29/19 Diabetic Foot Exam: Completed 08/15/20   Interpreter Needed?: No  Information entered by :: Lanier Ensignina Josi Roediger, LPN   Activities of Daily Living In your present state of health, do you have any difficulty performing the following activities: 09/12/2020 09/28/2019  Hearing? Malvin JohnsY Y  Comment wears hearing aids -  Vision? N N  Difficulty concentrating or making decisions? N N  Walking or climbing stairs? N N  Dressing or bathing? N N  Doing errands, shopping? N N  Preparing Food and eating ? N -  Using  the Toilet? N -  In the past six months, have you accidently leaked urine? N -  Do you have problems with loss of bowel control? N -  Managing your Medications? N -  Managing your Finances? N -  Housekeeping or managing your Housekeeping? N -  Some recent data might be hidden    Patient Care Team: Ardith Dark, MD as PCP - General (Family Medicine) Barnett Abu,  MD as Attending Physician (Neurosurgery) Romero Belling, MD as Consulting Physician (Endocrinology) Salvatore Marvel, MD as Consulting Physician (Orthopedic Surgery) Clois Dupes, DMD as Consulting Physician (Dentistry) [facility] as Consulting Physician (Orthopedic Surgery) Hillery Aldo, NP as Nurse Practitioner (Nurse Practitioner) Nelson Chimes, MD as Consulting Physician (Ophthalmology)  Indicate any recent Medical Services you may have received from other than [facility] providers in the past year (date may be approximate).     Assessment:   This is a routine wellness examination for Melbeta.  Hearing/Vision screen Hearing Screening - Comments:: Pt wears hearing aids  Vision Screening - Comments:: Pt follows up with Dr Hazle Quant /Dr Shawna Orleans for annual eye exams   Dietary issues and exercise activities discussed:     Goals Addressed             This Visit's Progress    Patient Stated       Get stronger and keep A1C below 6        Depression Screen PHQ 2/9 Scores 09/12/2020 09/03/2019 09/02/2018 01/08/2018 01/24/2016  PHQ - 2 Score 0 0 0 0 0    Fall Risk Fall Risk  09/12/2020 09/03/2019 01/13/2019 09/02/2018 04/28/2018  Falls in the past year? 0 0 0 1 0  Number falls in past yr: 0 0 - 0 -  Injury with Fall? 0 0 - 1 -  Risk for fall due to : - - - Impaired balance/gait -  Follow up Falls prevention discussed Falls prevention discussed - Education provided -    FALL RISK PREVENTION PERTAINING TO THE HOME:  Any stairs in or around the home? Yes  If so, are there any without handrails? No  Home free of loose throw rugs in walkways, pet beds, electrical cords, etc? Yes  Adequate lighting in your home to reduce risk of falls? Yes   ASSISTIVE DEVICES UTILIZED TO PREVENT FALLS:  Life alert? No  Use of a cane, walker or w/c? No  Grab bars in the bathroom? No  Shower chair or bench in shower? No  Elevated toilet seat or a handicapped toilet? No    TIMED UP AND GO:  Was the test performed? No .    Cognitive Function:     6CIT Screen 09/12/2020 09/03/2019 09/02/2018  What Year? 0 points 0 points 0 points  What month? 0 points 0 points 0 points  What time? 0 points - 0 points  Count back from 20 0 points 0 points 0 points  Months in reverse 0 points 0 points 0 points  Repeat phrase 0 points 0 points 0 points  Total Score 0 - 0    Immunizations Immunization History  Administered Date(s) Administered   Fluad Quad(high Dose 65+) 12/08/2018, 10/26/2019   Influenza Split 12/10/2011   Influenza Whole 11/22/2009   Influenza, High Dose Seasonal PF 10/13/2015, 12/03/2016, 10/22/2017   Influenza-Unspecified 11/10/2013   Moderna Sars-Covid-2 Vaccination 01/14/2019, 02/12/2019   PFIZER(Purple Top)SARS-COV-2 Vaccination 12/28/2019   Pneumococcal Conjugate-13 11/28/2015   Pneumococcal Polysaccharide-23 03/29/2009, 12/03/2016   Tdap 11/09/2013  Zoster Recombinat (Shingrix) 05/28/2016, 09/03/2016   Zoster, Live 01/22/2010    TDAP status: Up to date  Flu Vaccine status: Due, Education has been provided regarding the importance of this vaccine. Advised may receive this vaccine at local pharmacy or Health Dept. Aware to provide a copy of the vaccination record if obtained from local pharmacy or Health Dept. Verbalized acceptance and understanding.  Pneumococcal vaccine status: Due, Education has been provided regarding the importance of this vaccine. Advised may receive this vaccine at local pharmacy or Health Dept. Aware to provide a copy of the vaccination record if obtained from local pharmacy or Health Dept. Verbalized acceptance and understanding.  Covid-19 vaccine status: Completed vaccines  Qualifies for Shingles Vaccine? Yes   Zostavax completed Yes   Shingrix Completed?: Yes  Screening Tests Health Maintenance  Topic Date Due   URINE MICROALBUMIN  04/15/2019   COLONOSCOPY (Pts 45-75yrs Insurance coverage will need to be  confirmed)  04/12/2020   COVID-19 Vaccine (4 - Booster for Moderna series) 04/27/2020   INFLUENZA VACCINE  08/22/2020   OPHTHALMOLOGY EXAM  12/28/2020   HEMOGLOBIN A1C  02/15/2021   FOOT EXAM  08/15/2021   TETANUS/TDAP  11/10/2023   Hepatitis C Screening  Completed   PNA vac Low Risk Adult  Completed   Zoster Vaccines- Shingrix  Completed   HPV VACCINES  Aged Out    Health Maintenance  Health Maintenance Due  Topic Date Due   URINE MICROALBUMIN  04/15/2019   COLONOSCOPY (Pts 45-21yrs Insurance coverage will need to be confirmed)  04/12/2020   COVID-19 Vaccine (4 - Booster for Moderna series) 04/27/2020   INFLUENZA VACCINE  08/22/2020    Colorectal cancer screening: 11/02/20 scheduled    Additional Screening:  Hepatitis C Screening:  Completed 11/28/15  Vision Screening: Recommended annual ophthalmology exams for early detection of glaucoma and other disorders of the eye. Is the patient up to date with their annual eye exam?  Yes  Who is the provider or what is the name of the office in which the patient attends annual eye exams? Dr Darl Pikes If pt is not established with a provider, would they like to be referred to a provider to establish care? No .   Dental Screening: Recommended annual dental exams for proper oral hygiene  Community Resource Referral / Chronic Care Management: CRR required this visit?  No   CCM required this visit?  No      Plan:     I have personally reviewed and noted the following in the patient's chart:   Medical and social history Use of alcohol, tobacco or illicit drugs  Current medications and supplements including opioid prescriptions. Patient is not currently taking opioid prescriptions. Functional ability and status Nutritional status Physical activity Advanced directives List of other physicians Hospitalizations, surgeries, and ER visits in previous 12 months Vitals Screenings to include cognitive, depression, and falls Referrals  and appointments  In addition, I have reviewed and discussed with patient certain preventive protocols, quality metrics, and best practice recommendations. A written personalized care plan for preventive services as well as general preventive health recommendations were provided to patient.     Marzella Schlein, LPN   [id number]   Nurse Notes: pt stated below knee on left leg he is feeling sensations no pain just a concern. Pt stated no appt at this time.

## 2020-09-12 NOTE — Patient Instructions (Signed)
David Maynard , Thank you for taking time to come for your Medicare Wellness Visit. I appreciate your ongoing commitment to your health goals. Please review the following plan we discussed and let me know if I can assist you in the future.   Screening recommendations/referrals: Colonoscopy: Scheduled 11/02/20 for colonoscopy  Recommended yearly ophthalmology/optometry visit for glaucoma screening and checkup Recommended yearly dental visit for hygiene and checkup  Vaccinations: Influenza vaccine: Due Pneumococcal vaccine: Completed  Tdap vaccine: Done 11/09/13 repeat every 10 years Due 11/10/23 Shingles vaccine: Completed 5/7, 09/03/16   Covid-19: Completed 01/14/19,02/12/19 & 12/28/19  Advanced directives: Copies in chart  Conditions/risks identified: get stronger and keep A1C below 6   Next appointment: Follow up in one year for your annual wellness visit.   Preventive Care 84 Years and Older, Male Preventive care refers to lifestyle choices and visits with your health care provider that can promote health and wellness. What does preventive care include? A yearly physical exam. This is also called an annual well check. Dental exams once or twice a year. Routine eye exams. Ask your health care provider how often you should have your eyes checked. Personal lifestyle choices, including: Daily care of your teeth and gums. Regular physical activity. Eating a healthy diet. Avoiding tobacco and drug use. Limiting alcohol use. Practicing safe sex. Taking low doses of aspirin every day. Taking vitamin and mineral supplements as recommended by your health care provider. What happens during an annual well check? The services and screenings done by your health care provider during your annual well check will depend on your age, overall health, lifestyle risk factors, and family history of disease. Counseling  Your health care provider may ask you questions about your: Alcohol use. Tobacco  use. Drug use. Emotional well-being. Home and relationship well-being. Sexual activity. Eating habits. History of falls. Memory and ability to understand (cognition). Work and work Astronomer. Screening  You may have the following tests or measurements: Height, weight, and BMI. Blood pressure. Lipid and cholesterol levels. These may be checked every 5 years, or more frequently if you are over 92 years old. Skin check. Lung cancer screening. You may have this screening every year starting at age 57 if you have a 30-pack-year history of smoking and currently smoke or have quit within the past 15 years. Fecal occult blood test (FOBT) of the stool. You may have this test every year starting at age 57. Flexible sigmoidoscopy or colonoscopy. You may have a sigmoidoscopy every 5 years or a colonoscopy every 10 years starting at age 49. Prostate cancer screening. Recommendations will vary depending on your family history and other risks. Hepatitis C blood test. Hepatitis B blood test. Sexually transmitted disease (STD) testing. Diabetes screening. This is done by checking your blood sugar (glucose) after you have not eaten for a while (fasting). You may have this done every 1-3 years. Abdominal aortic aneurysm (AAA) screening. You may need this if you are a current or former smoker. Osteoporosis. You may be screened starting at age 36 if you are at high risk. Talk with your health care provider about your test results, treatment options, and if necessary, the need for more tests. Vaccines  Your health care provider may recommend certain vaccines, such as: Influenza vaccine. This is recommended every year. Tetanus, diphtheria, and acellular pertussis (Tdap, Td) vaccine. You may need a Td booster every 10 years. Zoster vaccine. You may need this after age 36. Pneumococcal 13-valent conjugate (PCV13) vaccine. One dose is recommended  after age 14. Pneumococcal polysaccharide (PPSV23) vaccine.  One dose is recommended after age 33. Talk to your health care provider about which screenings and vaccines you need and how often you need them. This information is not intended to replace advice given to you by your health care provider. Make sure you discuss any questions you have with your health care provider. Document Released: 02/04/2015 Document Revised: 09/28/2015 Document Reviewed: 11/09/2014 Elsevier Interactive Patient Education  2017 [place] Prevention in the Home Falls can cause injuries. They can happen to people of all ages. There are many things you can do to make your home safe and to help prevent falls. What can I do on the outside of my home? Regularly fix the edges of walkways and driveways and fix any cracks. Remove anything that might make you trip as you walk through a door, such as a raised step or threshold. Trim any bushes or trees on the path to your home. Use bright outdoor lighting. Clear any walking paths of anything that might make someone trip, such as rocks or tools. Regularly check to see if handrails are loose or broken. Make sure that both sides of any steps have handrails. Any raised decks and porches should have guardrails on the edges. Have any leaves, snow, or ice cleared regularly. Use sand or salt on walking paths during winter. Clean up any spills in your garage right away. This includes oil or grease spills. What can I do in the bathroom? Use night lights. Install grab bars by the toilet and in the tub and shower. Do not use towel bars as grab bars. Use non-skid mats or decals in the tub or shower. If you need to sit down in the shower, use a plastic, non-slip stool. Keep the floor dry. Clean up any water that spills on the floor as soon as it happens. Remove soap buildup in the tub or shower regularly. Attach bath mats securely with double-sided non-slip rug tape. Do not have throw rugs and other things on the floor that can make  you trip. What can I do in the bedroom? Use night lights. Make sure that you have a light by your bed that is easy to reach. Do not use any sheets or blankets that are too big for your bed. They should not hang down onto the floor. Have a firm chair that has side arms. You can use this for support while you get dressed. Do not have throw rugs and other things on the floor that can make you trip. What can I do in the kitchen? Clean up any spills right away. Avoid walking on wet floors. Keep items that you use a lot in easy-to-reach places. If you need to reach something above you, use a strong step stool that has a grab bar. Keep electrical cords out of the way. Do not use floor polish or wax that makes floors slippery. If you must use wax, use non-skid floor wax. Do not have throw rugs and other things on the floor that can make you trip. What can I do with my stairs? Do not leave any items on the stairs. Make sure that there are handrails on both sides of the stairs and use them. Fix handrails that are broken or loose. Make sure that handrails are as long as the stairways. Check any carpeting to make sure that it is firmly attached to the stairs. Fix any carpet that is loose or worn. Avoid having throw  rugs at the top or bottom of the stairs. If you do have throw rugs, attach them to the floor with carpet tape. Make sure that you have a light switch at the top of the stairs and the bottom of the stairs. If you do not have them, ask someone to add them for you. What else can I do to help prevent falls? Wear shoes that: Do not have high heels. Have rubber bottoms. Are comfortable and fit you well. Are closed at the toe. Do not wear sandals. If you use a stepladder: Make sure that it is fully opened. Do not climb a closed stepladder. Make sure that both sides of the stepladder are locked into place. Ask someone to hold it for you, if possible. Clearly mark and make sure that you can  see: Any grab bars or handrails. First and last steps. Where the edge of each step is. Use tools that help you move around (mobility aids) if they are needed. These include: Canes. Walkers. Scooters. Crutches. Turn on the lights when you go into a dark area. Replace any light bulbs as soon as they burn out. Set up your furniture so you have a clear path. Avoid moving your furniture around. If any of your floors are uneven, fix them. If there are any pets around you, be aware of where they are. Review your medicines with your doctor. Some medicines can make you feel dizzy. This can increase your chance of falling. Ask your doctor what other things that you can do to help prevent falls. This information is not intended to replace advice given to you by your health care provider. Make sure you discuss any questions you have with your health care provider. Document Released: 11/04/2008 Document Revised: 06/16/2015 Document Reviewed: 02/12/2014 Elsevier Interactive Patient Education  2017 [employer].

## 2020-09-14 ENCOUNTER — Other Ambulatory Visit: Payer: Self-pay

## 2020-09-14 DIAGNOSIS — E119 Type 2 diabetes mellitus without complications: Secondary | ICD-10-CM

## 2020-09-14 MED ORDER — LANTUS SOLOSTAR 100 UNIT/ML ~~LOC~~ SOPN: 5.0000 [IU] | PEN_INJECTOR | SUBCUTANEOUS | 99 refills | Status: DC

## 2020-09-19 ENCOUNTER — Encounter: Payer: Self-pay | Admitting: Family Medicine

## 2020-10-18 ENCOUNTER — Encounter (INDEPENDENT_AMBULATORY_CARE_PROVIDER_SITE_OTHER): Payer: Medicare Other | Admitting: Endocrinology

## 2020-10-18 ENCOUNTER — Encounter: Payer: Self-pay | Admitting: Endocrinology

## 2020-10-18 DIAGNOSIS — E119 Type 2 diabetes mellitus without complications: Secondary | ICD-10-CM | POA: Diagnosis not present

## 2020-10-19 ENCOUNTER — Encounter: Payer: Self-pay | Admitting: Gastroenterology

## 2020-10-19 ENCOUNTER — Other Ambulatory Visit: Payer: Self-pay

## 2020-10-19 ENCOUNTER — Ambulatory Visit (AMBULATORY_SURGERY_CENTER): Payer: Medicare Other

## 2020-10-19 VITALS — Ht 68.0 in | Wt 140.0 lb

## 2020-10-19 DIAGNOSIS — Z1211 Encounter for screening for malignant neoplasm of colon: Secondary | ICD-10-CM

## 2020-10-19 MED ORDER — PEG-KCL-NACL-NASULF-NA ASC-C 100 G PO SOLR: 1.0000 | Freq: Once | ORAL | 0 refills | Status: AC

## 2020-10-19 NOTE — Progress Notes (Signed)

## 2020-10-21 NOTE — Telephone Encounter (Signed)
I reviewed continuous glucose monitor data.  70-200.  It is in general highest at HS.  It decreases 7AM-5PM.  There are slight increases at 7AM and 7PM.

## 2020-10-23 ENCOUNTER — Telehealth: Payer: Self-pay | Admitting: Endocrinology

## 2020-11-02 ENCOUNTER — Ambulatory Visit (AMBULATORY_SURGERY_CENTER): Payer: Medicare Other | Admitting: Gastroenterology

## 2020-11-02 ENCOUNTER — Encounter: Payer: Self-pay | Admitting: Gastroenterology

## 2020-11-02 ENCOUNTER — Other Ambulatory Visit: Payer: Self-pay

## 2020-11-02 VITALS — BP 99/66 | HR 68 | Temp 97.8°F | Resp 13 | Ht 68.0 in | Wt 140.0 lb

## 2020-11-02 DIAGNOSIS — Z1211 Encounter for screening for malignant neoplasm of colon: Secondary | ICD-10-CM

## 2020-11-02 DIAGNOSIS — Z538 Procedure and treatment not carried out for other reasons: Secondary | ICD-10-CM

## 2020-11-02 MED ORDER — SODIUM CHLORIDE 0.9 % IV SOLN: 500.0000 mL | INTRAVENOUS | Status: DC

## 2020-11-02 NOTE — Progress Notes (Signed)
History & Physical  Primary Care Physician:  Ardith Dark, MD Primary Gastroenterologist: Claudette Head, MD  CHIEF COMPLAINT:  CRC screening  HPI: David Maynard is a 70 y.o. male who is average risk for [facility] presents for colonoscopy.  No active gastrointestinal complaints.    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   Thyroid disease    Unspecified hypothyroidism     Past Surgical History:  Procedure Laterality Date   ESOPHAGOGASTRODUODENOSCOPY  06/16/2003   INGUINAL HERNIA REPAIR     left   JOINT REPLACEMENT     KNEE ARTHROSCOPY     Left   KNEE ARTHROSCOPY W/ ACL RECONSTRUCTION     left   ROTATOR CUFF REPAIR     Right x2   Stress Cardiolite  07/15/2000   TOTAL KNEE ARTHROPLASTY Right 11/04/2017   TOTAL KNEE ARTHROPLASTY Right 11/04/2017   Procedure: TOTAL KNEE ARTHROPLASTY;  Surgeon: Salvatore Marvel, MD;  Location: [facility] OR;  Service: Orthopedics;  Laterality: Right;   TOTAL KNEE ARTHROPLASTY Left 12/23/2017   TOTAL KNEE ARTHROPLASTY Left 12/23/2017   Procedure: TOTAL KNEE ARTHROPLASTY;  Surgeon: Salvatore Marvel, MD;  Location: [facility] OR;  Service: Orthopedics;  Laterality: Left;   VASECTOMY  05/2002    Prior to Admission medications   Medication Sig Start Date End Date Taking? Authorizing Provider  ACCU-CHEK GUIDE test strip USE TO CHECK BLOOD SUGAR 6  TIMES DAILY 06/12/20  Yes Romero Belling, MD  ascorbic acid (VITAMIN C) 500 MG tablet Take 1 tablet (500 mg total) by mouth daily. 09/28/19  Yes Almon Hercules, MD  atorvastatin (LIPITOR) 40 MG tablet Take 1 tablet (40 mg total) by mouth daily.  02/26/20  Yes Ardith Dark, MD  Calcium Carbonate (CALCIUM 600 PO) Take 1 tablet by mouth daily.   Yes [provider]  Cinnamon 500 MG capsule Take 3,000 mg by mouth 2 (two) times daily.   Yes [provider]  clomiPHENE (CLOMID) 50 MG tablet TAKE ONE-HALF TABLET BY  MOUTH DAILY 08/01/20  Yes Romero Belling, MD  colesevelam [facility]) 625 MG tablet TAKE 4 TABLETS BY MOUTH  DAILY 02/26/20  Yes Romero Belling, MD  Continuous Blood Gluc Sensor (DEXCOM G6 SENSOR) MISC 1 Device by Does not apply route See admin instructions. 1 every 10 days 02/15/20  Yes Romero Belling, MD  docusate sodium (COLACE) 100 MG capsule Take 100 mg by mouth 2 (two) times daily.   Yes [provider]  Ferrous Sulfate Dried 45 MG TBCR Take 1 tablet by mouth 2 (two) times daily.   Yes [provider]  insulin glargine (LANTUS SOLOSTAR) 100 UNIT/ML Solostar Pen Inject 5 Units into the skin every morning. 09/14/20  Yes Romero Belling, MD  insulin lispro (HUMALOG KWIKPEN) 100 UNIT/ML KwikPen 3 times a day (just before each meal), 4-7-8 units, and pen needles 3/day 08/23/20  Yes Romero Belling, MD  Insulin Pen Needle 31G X 5 MM MISC 1 Device by Does not apply route in the morning and at bedtime. 07/31/20  Yes Romero Belling, MD  levothyroxine (SYNTHROID) 75 MCG tablet TAKE 1 TABLET BY MOUTH  DAILY 12/31/19  Yes Romero Belling, MD  liraglutide (VICTOZA) 18 MG/3ML SOPN Inject 1.8 mg into the skin every morning. 08/02/20  Yes Romero Belling, MD  metFORMIN (GLUCOPHAGE-XR) 500 MG 24 hr tablet Take 2 tablets (1,000 mg total) by mouth 2 (two) times daily. 08/31/20  Yes Romero Belling, MD  Multiple Vitamin (MULTIVITAMIN) tablet Take 1 tablet by mouth daily.   Yes [provider]  MYRBETRIQ 50 MG TB24 tablet Take 50 mg by mouth daily. 03/31/20  Yes [provider]  omega-3 acid ethyl esters (LOVAZA) 1 g capsule TAKE 2 CAPSULES BY MOUTH  TWICE DAILY 12/31/19  Yes Ardith Dark, MD  omeprazole (PRILOSEC) 40 MG  capsule TAKE 1 CAPSULE BY MOUTH  DAILY 07/27/20  Yes Ardith Dark, MD  pioglitazone (ACTOS) 30 MG tablet Take 1 tablet (30 mg total) by mouth daily. 08/31/20  Yes Romero Belling, MD  Trospium Chloride 60 MG CP24  12/03/18  Yes [provider]  Turmeric 500 MG CAPS Take 1,500 mg by mouth daily.   Yes [provider]  amoxicillin (AMOXIL) 500 MG tablet TAKE FOUR TABLETS BY MOUTH 1 HOUR BEFORE DENTAL WORK Patient not taking: No sig reported 02/24/20   Ardith Dark, MD  Glucosamine-Chondroit-Vit C-Mn (GLUCOSAMINE CHONDR 1500 COMPLX PO) Take 2 tablets by mouth daily.    [provider]  tadalafil (ADCIRCA/CIALIS) 20 MG tablet Take 20 mg by mouth daily as needed for erectile dysfunction. Patient not taking: No sig reported    [provider]    Current Outpatient Medications  Medication Sig Dispense Refill   ACCU-CHEK GUIDE test strip USE TO CHECK BLOOD SUGAR 6  TIMES DAILY 600 strip 3   ascorbic acid (VITAMIN C) 500 MG tablet Take 1 tablet (500 mg total) by mouth daily. 20 tablet 0   atorvastatin (LIPITOR) 40 MG tablet Take 1 tablet (40 mg total) by mouth daily. 90 tablet 3   Calcium Carbonate (CALCIUM 600 PO) Take 1 tablet by mouth daily.     Cinnamon 500 MG capsule Take 3,000 mg by mouth 2 (two) times daily.     clomiPHENE (CLOMID) 50 MG tablet TAKE ONE-HALF TABLET BY  MOUTH DAILY 45 tablet 3   colesevelam (WELCHOL) 625 MG tablet TAKE 4 TABLETS BY MOUTH  DAILY 360 tablet 3   Continuous Blood Gluc Sensor (DEXCOM G6 SENSOR) MISC 1 Device by Does not apply route See admin instructions. 1 every 10 days 9 each 3   docusate sodium (COLACE) 100 MG capsule Take 100 mg by mouth 2 (two) times daily.     Ferrous Sulfate Dried 45 MG TBCR Take 1 tablet by mouth 2 (two) times daily.     insulin glargine (LANTUS SOLOSTAR) 100 UNIT/ML Solostar Pen Inject 5 Units into the skin every morning. 15 mL PRN   insulin lispro (HUMALOG KWIKPEN) 100 UNIT/ML KwikPen 3 times a day (just  before each meal), 4-7-8 units, and pen needles 3/day 30 mL 11   Insulin Pen Needle 31G X 5 MM MISC 1 Device by Does not apply route in the morning and at bedtime. 200 each 3   levothyroxine (SYNTHROID) 75 MCG tablet TAKE 1 TABLET BY MOUTH  DAILY 90 tablet 3   liraglutide (VICTOZA) 18 MG/3ML SOPN Inject 1.8 mg into the skin every morning. 27 mL 3   metFORMIN (GLUCOPHAGE-XR) 500 MG 24 hr tablet Take 2 tablets (1,000 mg total) by mouth 2 (  two) times daily. 360 tablet 3   Multiple Vitamin (MULTIVITAMIN) tablet Take 1 tablet by mouth daily.     MYRBETRIQ 50 MG TB24 tablet Take 50 mg by mouth daily.     omega-3 acid ethyl esters (LOVAZA) 1 g capsule TAKE 2 CAPSULES BY MOUTH  TWICE DAILY 360 capsule 3   omeprazole (PRILOSEC) 40 MG capsule TAKE 1 CAPSULE BY MOUTH  DAILY 90 capsule 3   pioglitazone (ACTOS) 30 MG tablet Take 1 tablet (30 mg total) by mouth daily. 90 tablet 3   Trospium Chloride 60 MG CP24      Turmeric 500 MG CAPS Take 1,500 mg by mouth daily.     amoxicillin (AMOXIL) 500 MG tablet TAKE FOUR TABLETS BY MOUTH 1 HOUR BEFORE DENTAL WORK (Patient not taking: No sig reported) 20 tablet 1   Glucosamine-Chondroit-Vit C-Mn (GLUCOSAMINE CHONDR 1500 COMPLX PO) Take 2 tablets by mouth daily.     tadalafil (ADCIRCA/CIALIS) 20 MG tablet Take 20 mg by mouth daily as needed for erectile dysfunction. (Patient not taking: No sig reported)     Current Facility-Administered Medications  Medication Dose Route Frequency Provider Last Rate Last Admin   0.9 %  sodium chloride infusion  500 mL Intravenous Continuous Meryl Dare, MD        Allergies as of 11/02/2020   (No Known Allergies)    Family History  Problem Relation Age of Onset   Hypertension Mother    Kidney disease Father    Breast cancer Neg Hx    Celiac disease Neg Hx    Cirrhosis Neg Hx    Clotting disorder Neg Hx    Colitis Neg Hx    Colon cancer Neg Hx    Colon polyps Neg Hx    Crohn's disease Neg Hx    Cystic fibrosis Neg Hx     Diabetes Neg Hx    Esophageal cancer Neg Hx    Heart disease Neg Hx    Hemochromatosis Neg Hx    Inflammatory bowel disease Neg Hx    Irritable bowel syndrome Neg Hx    Liver cancer Neg Hx    Liver disease Neg Hx    Ovarian cancer Neg Hx    Pancreatic cancer Neg Hx    Prostate cancer Neg Hx    Rectal cancer Neg Hx    Stomach cancer Neg Hx    Ulcerative colitis Neg Hx    Uterine cancer Neg Hx    Wilson's disease Neg Hx     Social History   Socioeconomic History   Marital status: Married    Spouse name: Not on file   Number of children: 2   Years of education: Not on file   Highest education level: Not on file  Occupational History   Occupation: Retired Water engineer for [employer]  Tobacco Use   Smoking status: Never   Smokeless tobacco: Never  Vaping Use   Vaping Use: Never used  Substance and Sexual Activity   Alcohol use: Yes    Alcohol/week: 1.0 standard drink    Types: 1 Cans of beer per week    Comment: weekly   Drug use: No   Sexual activity: Not on file  Other Topics Concern   Not on file  Social History Narrative   Not on file   Social Determinants of Health   Financial Resource Strain: Low Risk    Difficulty of Paying Living Expenses: Not hard at all  Food Insecurity: No Food  Insecurity   Worried About Programme researcher, broadcasting/film/video in the Last Year: Never true   Ran Out of Food in the Last Year: Never true  Transportation Needs: No Transportation Needs   Lack of Transportation (Medical): No   Lack of Transportation (Non-Medical): No  Physical Activity: Sufficiently Active   Days of Exercise per Week: 5 days   Minutes of Exercise per Session: 60 min  Stress: No Stress Concern Present   Feeling of Stress : Not at all  Social Connections: Socially Integrated   Frequency of Communication with Friends and Family: More than three times a week   Frequency of Social Gatherings with Friends and Family: More than three times a week   Attends Religious  Services: More than 4 times per year   Active Member of [employer] or Organizations: Yes   Attends Banker Meetings: 1 to 4 times per year   Marital Status: Married  Catering manager Violence: Not At Risk   Fear of Current or Ex-Partner: No   Emotionally Abused: No   Physically Abused: No   Sexually Abused: No    Review of Systems:  All systems reviewed an negative except where noted in HPI.  Gen: Denies any fever, chills, sweats, anorexia, fatigue, weakness, malaise, weight loss, and sleep disorder CV: Denies chest pain, angina, palpitations, syncope, orthopnea, PND, peripheral edema, and claudication. Resp: Denies dyspnea at rest, dyspnea with exercise, cough, sputum, wheezing, coughing up blood, and pleurisy. GI: Denies vomiting blood, jaundice, and fecal incontinence.   Denies dysphagia or odynophagia. GU : Denies urinary burning, blood in urine, urinary frequency, urinary hesitancy, nocturnal urination, and urinary incontinence. MS: Denies joint pain, limitation of movement, and swelling, stiffness, low back pain, extremity pain. Denies muscle weakness, cramps, atrophy.  Derm: Denies rash, itching, dry skin, hives, moles, warts, or unhealing ulcers.  Psych: Denies depression, anxiety, memory loss, suicidal ideation, hallucinations, paranoia, and confusion. Heme: Denies bruising, bleeding, and enlarged lymph nodes. Neuro:  Denies any headaches, dizziness, paresthesias. Endo:  Denies any problems with DM, thyroid, adrenal function.   Physical Exam: General:  Alert, well-developed, in NAD Head:  Normocephalic and atraumatic. Eyes:  Sclera clear, no icterus.   Conjunctiva pink. Ears:  Normal auditory acuity. Mouth:  No deformity or lesions.  Neck:  Supple; no masses . Lungs:  Clear throughout to auscultation.   No wheezes, crackles, or rhonchi. No acute distress. Heart:  Regular rate and rhythm; no murmurs. Abdomen:  Soft, nondistended, nontender. No masses,  hepatomegaly. No obvious masses.  Normal bowel .    Rectal:  Deferred   Msk:  Symmetrical without gross deformities.. Pulses:  Normal pulses noted. Extremities:  Without edema. Neurologic:  Alert and  oriented x4;  grossly normal neurologically. Skin:  Intact without significant lesions or rashes. Cervical Nodes:  No significant cervical adenopathy. Psych:  Alert and cooperative. Normal mood and affect.   Impression / Plan:   CRC screening, average risk, for colonoscopy.  No active gastrointestinal complaints.    This patient is appropriate for endoscopic procedures in the ambulatory setting.    Venita Lick. Russella Dar  11/02/2020, 9:36 AM See Loretha Stapler, [facility], to contact our on call provider

## 2020-11-02 NOTE — Progress Notes (Signed)
Report given to PACU, vss 

## 2020-11-02 NOTE — Op Note (Addendum)
[facility] Patient Name: David Maynard Procedure Date: 11/02/2020 9:37 AM MRN: [id number] Endoscopist: Meryl Dare , MD Age: 70 Referring MD:  Date of Birth: 13-Oct-1950 Gender: Male Account #: [id number] Procedure:                Colonoscopy Indications:              Screening for colorectal malignant neoplasm Medicines:                Monitored Anesthesia Care Procedure:                Pre-Anesthesia Assessment:                           - Prior to the procedure, a History and Physical                            was performed, and patient medications and                            allergies were reviewed. The patient's tolerance of                            previous anesthesia was also reviewed. The risks                            and benefits of the procedure and the sedation                            options and risks were discussed with the patient.                            All questions were answered, and informed consent                            was obtained. Prior Anticoagulants: The patient has                            taken no previous anticoagulant or antiplatelet                            agents. ASA Grade Assessment: III - A patient with                            severe systemic disease. After reviewing the risks                            and benefits, the patient was deemed in                            satisfactory condition to undergo the procedure.                           After obtaining informed consent, the colonoscope  was passed under direct vision. Throughout the                            procedure, the patient's blood pressure, pulse, and                            oxygen saturations were monitored continuously. The                            0441 PCF-H190TL Slim SB Colonoscope was introduced                            through the anus with the intention of advancing to                            the cecum.  The scope was advanced to the transverse                            colon before the procedure was aborted due to the                            poor prep. Medications were not given. The rectum                            was photographed. The quality of the bowel                            preparation was inadequate despite extensive                            lavage, suction during insertion. The colonoscopy                            was somewhat difficult due to inadequate bowel                            prep, a redundant colon, significant looping and a                            tortuous colon. The patient tolerated the procedure                            well. Scope In: 9:40:02 AM Scope Out: 10:07:16 AM Scope Withdrawal Time: 0 hours 9 minutes 16 seconds  Total Procedure Duration: 0 hours 27 minutes 14 seconds  Findings:                 The perianal and digital rectal examinations were                            normal.                           Internal hemorrhoids were found during  retroflexion. The hemorrhoids were small and Grade                            I (internal hemorrhoids that do not prolapse).                           A large amount of stool was found in the rectum, in                            the sigmoid colon, in the descending colon and in                            the transverse colon, precluding visualization.                            Lavage of the area was performed using a large                            amount of tap water, resulting in incomplete                            clearance with continued poor visualization.                           The exam was otherwise without abnormality on                            direct and retroflexion views. Complications:            No immediate complications. Estimated blood loss:                            None. Estimated Blood Loss:     Estimated blood loss: none. Impression:                - Preparation of the colon was inadequate.                           - Internal hemorrhoids.                           - Stool in the rectum, in the sigmoid colon, in the                            descending colon and in the transverse colon.                           - The examination was otherwise normal on direct                            and retroflexion views.                           - No specimens collected. Recommendation:           - Repeat colonoscopy  at next available appointment                            (within 3 months) because the bowel preparation was                            suboptimal with an extended bowel prep.                           - Patient has a contact number available for                            emergencies. The signs and symptoms of potential                            delayed complications were discussed with the                            patient. Return to normal activities tomorrow.                            Written discharge instructions were provided to the                            patient.                           - Resume previous diet.                           - Continue present medications. Meryl Dare, MD 11/02/2020 10:12:13 AM This report has been signed electronically.

## 2020-11-02 NOTE — Progress Notes (Signed)
I have reviewed the patient's medical history in detail and updated the computerized patient record.

## 2020-11-02 NOTE — Patient Instructions (Signed)
Your colonoscopy was not completed today due to an inadequate clean out.  Your new colonoscopy has been scheduled.  Review upcoming appointment schedule in this handout.   YOU HAD AN ENDOSCOPIC PROCEDURE TODAY AT THE [facility]:   Refer to the procedure report that was given to you for any specific questions about what was found during the examination.  If the procedure report does not answer your questions, please call your gastroenterologist to clarify.  If you requested that your care partner not be given the details of your procedure findings, then the procedure report has been included in a sealed envelope for you to review at your convenience later.  YOU SHOULD EXPECT: Some feelings of bloating in the abdomen. Passage of more gas than usual.  Walking can help get rid of the air that was put into your GI tract during the procedure and reduce the bloating. If you had a lower endoscopy (such as a colonoscopy or flexible sigmoidoscopy) you may notice spotting of blood in your stool or on the toilet paper. If you underwent a bowel prep for your procedure, you may not have a normal bowel movement for a few days.  Please Note:  You might notice some irritation and congestion in your nose or some drainage.  This is from the oxygen used during your procedure.  There is no need for concern and it should clear up in a day or so.  SYMPTOMS TO REPORT IMMEDIATELY:  Following lower endoscopy (colonoscopy or flexible sigmoidoscopy):  Excessive amounts of blood in the stool  Significant tenderness or worsening of abdominal pains  Swelling of the abdomen that is new, acute  Fever of 100F or higher  For urgent or emergent issues, a gastroenterologist can be reached at any hour by calling [phone]. Do not use MyChart messaging for urgent concerns.    DIET:  We do recommend a small meal at first, but then you may proceed to your regular diet.  Drink plenty of fluids but you should avoid  alcoholic beverages for 24 hours.  ACTIVITY:  You should plan to take it easy for the rest of today and you should NOT DRIVE or use heavy machinery until tomorrow (because of the sedation medicines used during the test).    FOLLOW UP: Our staff will call the number listed on your records 48-72 hours following your procedure to check on you and address any questions or concerns that you may have regarding the information given to you following your procedure. If we do not reach you, we will leave a message.  We will attempt to reach you two times.  During this call, we will ask if you have developed any symptoms of COVID 19. If you develop any symptoms (ie: fever, flu-like symptoms, shortness of breath, cough etc.) before then, please call [phone].  If you test positive for Covid 19 in the 2 weeks post procedure, please call and report this information to [place].    If any biopsies were taken you will be contacted by phone or by letter within the next 1-3 weeks.  Please call us at [phone] if you have not heard about the biopsies in 3 weeks.    SIGNATURES/CONFIDENTIALITY: You and/or your care partner have signed paperwork which will be entered into your electronic medical record.  These signatures attest to the fact that that the information above on your After Visit Summary has been reviewed and is understood.  Full responsibility of the confidentiality of  this discharge information lies with you and/or your care-partner.

## 2020-11-02 NOTE — Progress Notes (Signed)
Data will not transfer from monitor, vs being posted manually.   

## 2020-11-04 ENCOUNTER — Telehealth: Payer: Self-pay | Admitting: *Deleted

## 2020-11-04 NOTE — Telephone Encounter (Signed)
  Follow up Call-  Call back number 11/02/2020  Post procedure Call Back phone  # [phone]  Permission to leave phone message Yes  Some recent data might be hidden     Patient questions:  Do you have a fever, pain , or abdominal swelling? No. Pain Score  0 *  Have you tolerated food without any problems? Yes.    Have you been able to return to your normal activities? Yes.    Do you have any questions about your discharge instructions: Diet   No. Medications  No. Follow up visit  No.  Do you have questions or concerns about your Care? No.  Actions: * If pain score is 4 or above: No action needed, pain <4.

## 2020-11-05 ENCOUNTER — Other Ambulatory Visit: Payer: Self-pay | Admitting: Endocrinology

## 2020-11-05 ENCOUNTER — Other Ambulatory Visit: Payer: Self-pay | Admitting: Family Medicine

## 2020-11-11 ENCOUNTER — Other Ambulatory Visit: Payer: Self-pay | Admitting: Endocrinology

## 2020-11-11 ENCOUNTER — Encounter: Payer: Self-pay | Admitting: Endocrinology

## 2020-11-11 DIAGNOSIS — E119 Type 2 diabetes mellitus without complications: Secondary | ICD-10-CM

## 2020-11-11 MED ORDER — INSULIN LISPRO (1 UNIT DIAL) 100 UNIT/ML (KWIKPEN): PEN_INJECTOR | SUBCUTANEOUS | 3 refills | Status: DC

## 2020-11-14 ENCOUNTER — Other Ambulatory Visit: Payer: Self-pay

## 2020-11-14 DIAGNOSIS — E119 Type 2 diabetes mellitus without complications: Secondary | ICD-10-CM

## 2020-11-14 MED ORDER — INSULIN LISPRO (1 UNIT DIAL) 100 UNIT/ML (KWIKPEN)
PEN_INJECTOR | SUBCUTANEOUS | 3 refills | Status: DC
Start: 2020-11-14 — End: 2021-02-14

## 2020-11-21 ENCOUNTER — Encounter: Payer: Self-pay | Admitting: Endocrinology

## 2020-11-21 ENCOUNTER — Ambulatory Visit (INDEPENDENT_AMBULATORY_CARE_PROVIDER_SITE_OTHER): Payer: Medicare Other | Admitting: Endocrinology

## 2020-11-21 ENCOUNTER — Other Ambulatory Visit: Payer: Self-pay

## 2020-11-21 VITALS — BP 138/60 | HR 94 | Ht 68.0 in | Wt 147.2 lb

## 2020-11-21 DIAGNOSIS — Z23 Encounter for immunization: Secondary | ICD-10-CM | POA: Diagnosis not present

## 2020-11-21 DIAGNOSIS — E119 Type 2 diabetes mellitus without complications: Secondary | ICD-10-CM

## 2020-11-21 LAB — POCT GLYCOSYLATED HEMOGLOBIN (HGB A1C): Hemoglobin A1C: 6.3 % — AB (ref 4.0–5.6)

## 2020-11-21 NOTE — Patient Instructions (Addendum)
Please continue the same insulin, and the same other medications.   Please come back for a follow-up appointment in 3 months.

## 2020-11-21 NOTE — Progress Notes (Signed)
Subjective:    Patient ID: David Maynard, male    DOB: Mar 02, 1950, 70 y.o.   MRN: [id number]  HPI Pt returns for f/u of diabetes mellitus:   DM type: 1 (due to lean body habitus and FHx of type 1).   Dx'ed: 2008 Complications: none.   Therapy: insulin since 2021, Victoza, and 3 oral meds.   DKA: never.  Severe hypoglycemia: never.  Pancreatitis: never.  Other: he takes multiple daily injections.   Interval history: pt states he feels well in general.  I reviewed continuous glucose monitor data.  Glucose varies from 70-160.  It is in general highest at 11PM, but there is little trend throughout the day.  He has hypoglycemia approx twice per week.   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   Thyroid disease    Unspecified hypothyroidism     Past Surgical History:  Procedure Laterality Date   ESOPHAGOGASTRODUODENOSCOPY  06/16/2003   INGUINAL HERNIA REPAIR     left   JOINT REPLACEMENT     KNEE ARTHROSCOPY     Left   KNEE ARTHROSCOPY W/ ACL RECONSTRUCTION     left   ROTATOR CUFF REPAIR     Right x2   Stress Cardiolite  07/15/2000   TOTAL KNEE ARTHROPLASTY Right 11/04/2017   TOTAL KNEE ARTHROPLASTY Right 11/04/2017   Procedure: TOTAL KNEE ARTHROPLASTY;  Surgeon: Salvatore MarvelWainer, Robert, MD;  Location: [facility] OR;  Service: Orthopedics;  Laterality: Right;   TOTAL KNEE ARTHROPLASTY Left 12/23/2017   TOTAL KNEE ARTHROPLASTY Left 12/23/2017   Procedure: TOTAL KNEE ARTHROPLASTY;  Surgeon: Salvatore MarvelWainer, Robert, MD;  [facility];  Service: Orthopedics;  Laterality: Left;   VASECTOMY  05/2002    Social History    Socioeconomic History   Marital status: Married    Spouse name: Not on file   Number of children: 2   Years of education: Not on file   Highest education level: Not on file  Occupational History   Occupation: Retired Water engineer- manager for [employer]  Tobacco Use   Smoking status: Never   Smokeless tobacco: Never  Vaping Use   Vaping Use: Never used  Substance and Sexual Activity   Alcohol use: Yes    Alcohol/week: 1.0 standard drink    Types: 1 Cans of beer per week    Comment: weekly   Drug use: No   Sexual activity: Not on file  Other Topics Concern   Not on file  Social History Narrative   Not on file   Social Determinants of Health   Financial Resource Strain: Low Risk    Difficulty of Paying Living Expenses: Not hard at all  Food Insecurity: No Food Insecurity   Worried About Programme researcher, broadcasting/film/videounning Out of Food in the Last Year: Never true   Ran Out of Food in the Last Year: Never true  Transportation Needs: No Transportation Needs   Lack of Transportation (Medical): No   Lack of Transportation (Non-Medical): No  Physical Activity: Sufficiently Active   Days of Exercise per Week: 5 days  Minutes of Exercise per Session: 60 min  Stress: No Stress Concern Present   Feeling of Stress : Not at all  Social Connections: Socially Integrated   Frequency of Communication with Friends and Family: More than three times a week   Frequency of Social Gatherings with Friends and Family: More than three times a week   Attends Religious Services: More than 4 times per year   Active Member of [employer] or Organizations: Yes   Attends Archivist Meetings: 1 to 4 times per year   Marital Status: Married  Human resources officer Violence: Not At Risk   Fear of Current or Ex-Partner: No   Emotionally Abused: No   Physically Abused: No   Sexually Abused: No    Current Outpatient Medications on File Prior to Visit  Medication Sig Dispense Refill   ACCU-CHEK GUIDE test strip USE TO CHECK BLOOD  SUGAR 6  TIMES DAILY 600 strip 3   amoxicillin (AMOXIL) 500 MG tablet TAKE FOUR TABLETS BY MOUTH 1 HOUR BEFORE DENTAL WORK 20 tablet 1   ascorbic acid (VITAMIN C) 500 MG tablet Take 1 tablet (500 mg total) by mouth daily. 20 tablet 0   atorvastatin (LIPITOR) 40 MG tablet TAKE 1 TABLET BY MOUTH  DAILY 90 tablet 3   Calcium Carbonate (CALCIUM 600 PO) Take 1 tablet by mouth daily.     Cinnamon 500 MG capsule Take 3,000 mg by mouth 2 (two) times daily.     clomiPHENE (CLOMID) 50 MG tablet TAKE ONE-HALF TABLET BY  MOUTH DAILY 45 tablet 3   colesevelam (WELCHOL) 625 MG tablet TAKE 4 TABLETS BY MOUTH  DAILY 360 tablet 3   Continuous Blood Gluc Sensor (DEXCOM G6 SENSOR) MISC 1 Device by Does not apply route See admin instructions. 1 every 10 days 9 each 3   docusate sodium (COLACE) 100 MG capsule Take 100 mg by mouth 2 (two) times daily.     Ferrous Sulfate Dried 45 MG TBCR Take 1 tablet by mouth 2 (two) times daily.     Glucosamine-Chondroit-Vit C-Mn (GLUCOSAMINE CHONDR 1500 COMPLX PO) Take 2 tablets by mouth daily.     insulin glargine (LANTUS SOLOSTAR) 100 UNIT/ML Solostar Pen Inject 5 Units into the skin every morning. 15 mL PRN   insulin lispro (HUMALOG KWIKPEN) 100 UNIT/ML KwikPen 3 times a day (just before each meal), 07-29-09 units, and pen needles 3/day 30 mL 3   Insulin Pen Needle 31G X 5 MM MISC 1 Device by Does not apply route in the morning and at bedtime. 200 each 3   levothyroxine (SYNTHROID) 75 MCG tablet TAKE 1 TABLET BY MOUTH  DAILY 90 tablet 3   liraglutide (VICTOZA) 18 MG/3ML SOPN Inject 1.8 mg into the skin every morning. 27 mL 3   metFORMIN (GLUCOPHAGE-XR) 500 MG 24 hr tablet Take 2 tablets (1,000 mg total) by mouth 2 (two) times daily. 360 tablet 3   Multiple Vitamin (MULTIVITAMIN) tablet Take 1 tablet by mouth daily.     MYRBETRIQ 50 MG TB24 tablet Take 50 mg by mouth daily.     omega-3 acid ethyl esters (LOVAZA) 1 g capsule TAKE 2 CAPSULES BY MOUTH  TWICE DAILY 360 capsule 3    omeprazole (PRILOSEC) 40 MG capsule TAKE 1 CAPSULE BY MOUTH  DAILY 90 capsule 3   pioglitazone (ACTOS) 30 MG tablet Take 1 tablet (30 mg total) by mouth daily. 90 tablet 3   tadalafil (ADCIRCA/CIALIS) 20 MG tablet Take 20 mg by mouth daily as  needed for erectile dysfunction.     Trospium Chloride 60 MG CP24      Turmeric 500 MG CAPS Take 1,500 mg by mouth daily.     No current facility-administered medications on file prior to visit.    No Known Allergies  Family History  Problem Relation Age of Onset   Hypertension Mother    Kidney disease Father    Breast cancer Neg Hx    Celiac disease Neg Hx    Cirrhosis Neg Hx    Clotting disorder Neg Hx    Colitis Neg Hx    Colon cancer Neg Hx    Colon polyps Neg Hx    Crohn's disease Neg Hx    Cystic fibrosis Neg Hx    Diabetes Neg Hx    Esophageal cancer Neg Hx    Heart disease Neg Hx    Hemochromatosis Neg Hx    Inflammatory bowel disease Neg Hx    Irritable bowel syndrome Neg Hx    Liver cancer Neg Hx    Liver disease Neg Hx    Ovarian cancer Neg Hx    Pancreatic cancer Neg Hx    Prostate cancer Neg Hx    Rectal cancer Neg Hx    Stomach cancer Neg Hx    Ulcerative colitis Neg Hx    Uterine cancer Neg Hx    Wilson's disease Neg Hx     BP 138/60 (BP Location: Right Arm, Patient Position: Sitting, Cuff Size: Normal)   Pulse 94   Ht 5\' 8"  (1.727 m)   Wt 147 lb 3.2 oz (66.8 kg)   SpO2 95%   BMI 22.38 kg/m    Review of Systems     Objective:   Physical Exam    Lab Results  Component Value Date   HGBA1C 6.3 (A) 11/21/2020      Assessment & Plan:  Type 1 DM: well-controlled Hypoglycemia, due to insulin: this limits aggressiveness of glycemic control  Patient Instructions  Please continue the same insulin, and the same other medications.   Please come back for a follow-up appointment in 3 months.

## 2020-12-12 ENCOUNTER — Encounter: Payer: Self-pay | Admitting: Family Medicine

## 2020-12-12 ENCOUNTER — Telehealth: Payer: Self-pay

## 2020-12-12 NOTE — Telephone Encounter (Signed)
Attempted to reach patient - left message on VM for patient to call back to the office to reschedule PV that was scheduled at 9:00 in person today; If patient fails to call back to the office by end of business today - a no show letter will be sent to the patient and the PV and procedure appts will be cancelled;

## 2020-12-12 NOTE — Telephone Encounter (Signed)
Patient failed to call back to the office; PV and procedure appts cancelled and no show letter sent to patient;

## 2020-12-12 NOTE — Telephone Encounter (Signed)
See note

## 2020-12-12 NOTE — Telephone Encounter (Signed)
Patient notified

## 2020-12-21 ENCOUNTER — Encounter: Payer: Self-pay | Admitting: Gastroenterology

## 2021-01-02 ENCOUNTER — Encounter: Payer: Self-pay | Admitting: Family Medicine

## 2021-01-02 NOTE — Telephone Encounter (Signed)
See note

## 2021-01-03 ENCOUNTER — Encounter: Payer: Medicare Other | Admitting: Gastroenterology

## 2021-01-03 ENCOUNTER — Other Ambulatory Visit: Payer: Self-pay | Admitting: *Deleted

## 2021-01-03 NOTE — Telephone Encounter (Signed)
Pt called to check the status of referral. Please Advise. Pt stated that he already has his appt next week. Pt is very upset that the referral has not be placed yet. I informed pt that sometimes it will take time for the referral to be sent and processed. Pt became more upset that the referral needs to be sent now. Please Advise.

## 2021-01-04 ENCOUNTER — Other Ambulatory Visit: Payer: Self-pay | Admitting: *Deleted

## 2021-01-04 ENCOUNTER — Encounter: Payer: Self-pay | Admitting: Family Medicine

## 2021-01-04 ENCOUNTER — Encounter: Payer: Self-pay | Admitting: Endocrinology

## 2021-01-04 DIAGNOSIS — H9190 Unspecified hearing loss, unspecified ear: Secondary | ICD-10-CM

## 2021-01-04 NOTE — Telephone Encounter (Signed)
Referral placed.

## 2021-01-18 ENCOUNTER — Telehealth: Payer: Self-pay | Admitting: *Deleted

## 2021-01-18 NOTE — Telephone Encounter (Signed)
David Maynard 09/27/1950 [id number]  Dear Dr. Everardo All,   Claudette Head, MD has scheduled the above patient for a colonoscopy at 9:00am on 02/28/21.  Our records show that he/she is on insulin therapy via an insulin pump.  Our colonoscopy prep protocol requires that:  the patient must be on a clear liquid diet the entire day prior to the procedure date as well as the morning of the procedure  the patient must be NPO for 3 hours prior to the procedure   the patient must consume a PEG 3350 solution to prepare for the procedure.  Please advise [place] of any adjustments that need to be made to the patients insulin pump therapy prior to the above procedure date.    Please route this completed form to me.  If you have any question, please call me at [phone].  Thank you for your help with this matter.  Sincerely,   Jillene Bucks, CMA

## 2021-01-18 NOTE — Telephone Encounter (Signed)
This pt has an order for an insulin pump BUT appears he has not started it yet. He is waiting on training. He has a PV 1-16 with a colon with Dr Russella Dar 02-28-2021. We will need insulin pump instructions prior to PV as I'm seeing he will get training after he returned from a cruise the end of December  He sees Dr Everardo All for this.  Please obtain pump instructions  Thanks, Elizebeth Brooking

## 2021-01-19 NOTE — Telephone Encounter (Signed)
Romero Belling, MD  Maura Crandall, CMA Caller: Unspecified Burgess Estelle,  3:38 PM) No change in Lantus.  Day prior: Take 1/2 rx'ed humalog  Day of: Skip humalog

## 2021-01-26 ENCOUNTER — Other Ambulatory Visit: Payer: Self-pay | Admitting: Endocrinology

## 2021-01-30 ENCOUNTER — Other Ambulatory Visit: Payer: Self-pay | Admitting: Endocrinology

## 2021-01-30 ENCOUNTER — Encounter: Payer: Self-pay | Admitting: Endocrinology

## 2021-01-30 MED ORDER — INSULIN LISPRO 100 UNIT/ML IJ SOLN: INTRAMUSCULAR | 3 refills | Status: DC

## 2021-01-31 ENCOUNTER — Encounter: Payer: Medicare Other | Attending: Endocrinology | Admitting: Nutrition

## 2021-01-31 DIAGNOSIS — E119 Type 2 diabetes mellitus without complications: Secondary | ICD-10-CM | POA: Insufficient documentation

## 2021-01-31 DIAGNOSIS — Z794 Long term (current) use of insulin: Secondary | ICD-10-CM | POA: Insufficient documentation

## 2021-02-02 ENCOUNTER — Telehealth: Payer: Self-pay | Admitting: Nutrition

## 2021-02-02 NOTE — Telephone Encounter (Signed)
Patient reported no difficulty giving boluses for lunch and supper today.  Says blood sugar was 136 acS tonight.  He had no questions for me at this time.  We reviewed what to do in the morning when he pump alarms that his temp basal rate has ended, and he reported that he had no questions.David Maynard

## 2021-02-02 NOTE — Progress Notes (Signed)
Pt. Was trained on the OmniPod 5 pump.  Settings were put in per Dr. George Hugh orders.  Basal rate: MN: 0.2, ISF: 100, I/C:1 (patient says he prefers to put in units of insulin because using this ratio, his blood sugars stay high.)  He was told to discuss this with Dr. Everardo All has his visit on Friday.  He agreed to do this. Target 110 with correction over 110, and timing is 4 hours.  He was shown how to bolus, and he re demonstrated this correctly.  He forgot and took his  Lantus insulin his morning, so his pump was put in a 100% basal reduction mode until 5AM tomorrow morning.  Steps were written for patient on what to do when this ends.  He reported good understanding of this.We discussed IOB, what this means and he reported good understanding of this.  We reviewed all steps on the checlist, and he signed the checklist as understanding all topics.  He was told to come back next week and we can review all topics again for better clarification, and he agreed to do this.  He had no final quesitons.

## 2021-02-02 NOTE — Patient Instructions (Signed)
Read over pump start booklet and pump manual Call 800 help line if questions or problems with pump Call office if blood sugars remain over 250, or less than 70

## 2021-02-02 NOTE — Telephone Encounter (Signed)
Patient reports that he has not started the automated mode, because he is not sure of this, and will be playing golf this afternoon.  He has also switched the pump to deliver carbs in stead of insulin, and set the carb ratio to 10, but said this was too much insulin for his meals and he needed to treat a low.  He was reminded that Dr. Everardo All wants that setting to be 50. He says this would not deliver as much insulin as he needs, and he is experimenting with the right number.  He now has it set on 12.  He was reminded that this pump is more efficient in delivering insulin and that he will be using at least 20% less insulin per day.  He did not want to change this setting at this time.   He was encouraged to see me next week to finish with the pump training, and he said he will call me next week to schedule the appointment.  He will be seeing Dr. Everardo All tomorrow afternoon.   He had no final questions for me at this time.

## 2021-02-03 ENCOUNTER — Other Ambulatory Visit: Payer: Self-pay

## 2021-02-03 ENCOUNTER — Ambulatory Visit (INDEPENDENT_AMBULATORY_CARE_PROVIDER_SITE_OTHER): Payer: Medicare Other | Admitting: Endocrinology

## 2021-02-03 ENCOUNTER — Encounter: Payer: Self-pay | Admitting: Endocrinology

## 2021-02-03 VITALS — BP 148/74 | HR 87 | Ht 68.0 in | Wt 150.0 lb

## 2021-02-03 DIAGNOSIS — E119 Type 2 diabetes mellitus without complications: Secondary | ICD-10-CM

## 2021-02-03 LAB — POCT GLYCOSYLATED HEMOGLOBIN (HGB A1C): Hemoglobin A1C: 6.2 % — AB (ref 4.0–5.6)

## 2021-02-03 NOTE — Patient Instructions (Addendum)
Please take these pump settings: basal rate of 0.2 units/hr. bolus of 1 unit/14 grams carbohydrate.   correction bolus (which some people call "sensitivity," or "insulin sensitivity ratio," or just "isr") of 1 unit for each 100 by which your glucose exceeds 100.   Please come back for a follow-up appointment in 1 month, as scheduled.

## 2021-02-03 NOTE — Progress Notes (Signed)
Subjective:    Patient ID: David Maynard, male    DOB: [birth year], 71 y.o.   MRN: [id number]  HPI Pt returns for f/u of diabetes mellitus:   DM type: 1 (due to lean body habitus and FHx of type 1).   Dx'ed: [id number] Complications: none.   Therapy: insulin since 2021, Victoza, and 3 oral meds.   DKA: never.  Severe hypoglycemia: never.  Pancreatitis: never.  Other: he takes multiple daily injections.   Interval history: pt states he feels well in general.  I reviewed continuous glucose monitor data.  Glucose varies from 50-210.  It is in general highest at 11AM, but there is little trend throughout the day.  He has hypoglycemia approx BID.    Basal rate: 0.2, ISF: 50, I/C: approx 1 unit/12 g CHO.   He averages a total of 15 units/d (83% bolus).  He takes 4 boluses per day.   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;  Service: Orthopedics;  Laterality: Right;   TOTAL KNEE ARTHROPLASTY Left 12/23/2017   TOTAL KNEE ARTHROPLASTY Left 12/23/2017   Procedure: TOTAL KNEE ARTHROPLASTY;  Surgeon:  Elsie Saas, MD;  Location: [place];  Service: Orthopedics;  Laterality: Left;   VASECTOMY  05/2002    Social History   Socioeconomic History   Marital status: Married    Spouse name: Not on file   Number of children: 2   Years of education: Not on file   Highest education level: Not on file  Occupational History   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  Tobacco Use   Smoking status: Never   Smokeless tobacco: Never  Vaping Use   Vaping Use: Never used  Substance and Sexual Activity   Alcohol use: Yes    Alcohol/week: 1.0 standard drink    Types: 1 Cans of beer per week    Comment: weekly   Drug use: No   Sexual activity: Not on file  Other Topics Concern   Not on file  Social History Narrative   Not on file   Social Determinants of Health   Financial Resource Strain: Low Risk    Difficulty of Paying Living Expenses: Not hard at all  Food Insecurity: No Food Insecurity   Worried About Charity fundraiser in the Last Year: Never true   [place] in the Last Year: Never true  Transportation Needs: No Transportation  Needs   Lack of Transportation (Medical): No   Lack of Transportation (Non-Medical): No  Physical Activity: Sufficiently Active   Days of Exercise per Week: 5 days   Minutes of Exercise per Session: 60 min  Stress: No Stress Concern Present   Feeling of Stress : Not at all  Social Connections: Socially Integrated   Frequency of Communication with Friends and Family: More than three times a week   Frequency of Social Gatherings with Friends and Family: More than three times a week   Attends Religious Services: More than 4 times per year   Active Member of [employer] or Organizations: Yes   Attends Archivist Meetings: 1 to 4 times per year   Marital Status: Married  Human resources officer Violence: Not At Risk   Fear of Current or Ex-Partner: No   Emotionally Abused: No   Physically Abused: No   Sexually Abused: No    Current  Outpatient Medications on File Prior to Visit  Medication Sig Dispense Refill   ACCU-CHEK GUIDE test strip USE TO CHECK BLOOD SUGAR 6  TIMES DAILY 600 strip 3   amoxicillin (AMOXIL) 500 MG tablet TAKE FOUR TABLETS BY MOUTH 1 HOUR BEFORE DENTAL WORK 20 tablet 1   ascorbic acid (VITAMIN C) 500 MG tablet Take 1 tablet (500 mg total) by mouth daily. 20 tablet 0   atorvastatin (LIPITOR) 40 MG tablet TAKE 1 TABLET BY MOUTH  DAILY 90 tablet 3   Calcium Carbonate (CALCIUM 600 PO) Take 1 tablet by mouth daily.     Cinnamon 500 MG capsule Take 3,000 mg by mouth 2 (two) times daily.     clomiPHENE (CLOMID) 50 MG tablet TAKE ONE-HALF TABLET BY  MOUTH DAILY 45 tablet 3   colesevelam (WELCHOL) 625 MG tablet TAKE 4 TABLETS BY MOUTH  DAILY 360 tablet 3   Continuous Blood Gluc Sensor (DEXCOM G6 SENSOR) MISC 1 Device by Does not apply route See admin instructions. 1 every 10 days 9 each 3   docusate sodium (COLACE) 100 MG capsule Take 100 mg by mouth 2 (two) times daily.     Ferrous Sulfate Dried 45 MG TBCR Take 1 tablet by mouth 2 (two) times daily.     Glucosamine-Chondroit-Vit C-Mn (GLUCOSAMINE CHONDR 1500 COMPLX PO) Take 2 tablets by mouth daily.     insulin glargine (LANTUS SOLOSTAR) 100 UNIT/ML Solostar Pen Inject 5 Units into the skin every morning. 15 mL PRN   insulin lispro (HUMALOG KWIKPEN) 100 UNIT/ML KwikPen 3 times a day (just before each meal), 07-29-09 units, and pen needles 3/day 30 mL 3   insulin lispro (HUMALOG) 100 UNIT/ML injection For use in pump, total of 40 units per day. 40 mL 3   Insulin Pen Needle 31G X 5 MM MISC 1 Device by Does not apply route in the morning and at bedtime. 200 each 3   levothyroxine (SYNTHROID) 75 MCG tablet TAKE 1 TABLET BY MOUTH  DAILY 90 tablet 3   liraglutide (VICTOZA) 18 MG/3ML SOPN Inject 1.8 mg into the skin every morning. 27 mL 3   metFORMIN (GLUCOPHAGE-XR) 500 MG 24 hr tablet Take 2 tablets (1,000 mg total) by mouth 2 (two) times daily. 360 tablet 3   Multiple  Vitamin (MULTIVITAMIN) tablet Take 1 tablet by mouth daily.     MYRBETRIQ 50 MG TB24 tablet Take 50 mg by mouth daily.     omega-3 acid ethyl esters (LOVAZA) 1 g capsule TAKE 2 CAPSULES BY MOUTH  TWICE DAILY 360  capsule 3   omeprazole (PRILOSEC) 40 MG capsule TAKE 1 CAPSULE BY MOUTH  DAILY 90 capsule 3   pioglitazone (ACTOS) 30 MG tablet Take 1 tablet (30 mg total) by mouth daily. 90 tablet 3   tadalafil (ADCIRCA/CIALIS) 20 MG tablet Take 20 mg by mouth daily as needed for erectile dysfunction.     Trospium Chloride 60 MG CP24      Turmeric 500 MG CAPS Take 1,500 mg by mouth daily.     No current facility-administered medications on file prior to visit.    No Known Allergies  Family History  Problem Relation Age of Onset   Hypertension Mother    Kidney disease Father    Breast cancer Neg Hx    Celiac disease Neg Hx    Cirrhosis Neg Hx    Clotting disorder Neg Hx    Colitis Neg Hx    Colon cancer Neg Hx    Colon polyps Neg Hx    Crohn's disease Neg Hx    Cystic fibrosis Neg Hx    Diabetes Neg Hx    Esophageal cancer Neg Hx    Heart disease Neg Hx    Hemochromatosis Neg Hx    Inflammatory bowel disease Neg Hx    Irritable bowel syndrome Neg Hx    Liver cancer Neg Hx    Liver disease Neg Hx    Ovarian cancer Neg Hx    Pancreatic cancer Neg Hx    Prostate cancer Neg Hx    Rectal cancer Neg Hx    Stomach cancer Neg Hx    Ulcerative colitis Neg Hx    Uterine cancer Neg Hx    Wilson's disease Neg Hx     BP (!) 148/74    Pulse 87    Ht 5\' 8"  (1.727 m)    Wt 150 lb (68 kg)    SpO2 98%    BMI 22.81 kg/m    Review of Systems     Objective:   Physical Exam  Lab Results  Component Value Date   HGBA1C 6.2 (A) 02/03/2021       Assessment & Plan:  Type 1 DM. Hypoglycemia, due to insulin: this limits aggressiveness of glycemic control.   Patient Instructions  Please take these pump settings: basal rate of 0.2 units/hr. bolus of 1 unit/14 grams carbohydrate.    correction bolus (which some people call "sensitivity," or "insulin sensitivity ratio," or just "isr") of 1 unit for each 100 by which your glucose exceeds 100.   Please come back for a follow-up appointment in 1 month, as scheduled.

## 2021-02-06 ENCOUNTER — Other Ambulatory Visit: Payer: Self-pay

## 2021-02-06 ENCOUNTER — Telehealth: Payer: Self-pay | Admitting: *Deleted

## 2021-02-06 ENCOUNTER — Ambulatory Visit (AMBULATORY_SURGERY_CENTER): Payer: Medicare Other | Admitting: *Deleted

## 2021-02-06 VITALS — Ht 68.0 in | Wt 140.0 lb

## 2021-02-06 DIAGNOSIS — Z1211 Encounter for screening for malignant neoplasm of colon: Secondary | ICD-10-CM

## 2021-02-06 MED ORDER — PEG 3350-KCL-NA BICARB-NACL 420 G PO SOLR
4000.0000 mL | Freq: Once | ORAL | 0 refills | Status: AC
Start: 2021-02-06 — End: 2021-02-06

## 2021-02-06 NOTE — Telephone Encounter (Signed)
David Maynard [birth year] [id number]   Dear Dr. Everardo All,    Dr. Russella Dar has scheduled the above individual for a(n) colonoscopy on 02/28/21.  Our records show that this patient is on insulin therapy via an insulin pump.  Our colonoscopy prep protocol requires that:   the patient must be on a clear liquid diet the entire day prior to the procedure date as well as the morning of the procedure  the patient must be NPO for 3 to 4 hours prior to the procedure   the patient must consume a PEG 3350 solution to prepare for the procedure.  Please advise [place] of any adjustments that need to be made to the patients insulin pump therapy prior to the above procedure date.      If you have any questions, please call me at [phone].  Thank you for your help with this matter.  Sincerely,  A. Hooks, CMA

## 2021-02-06 NOTE — Progress Notes (Signed)
No egg or soy allergy known to patient  No issues known to pt with past sedation with any surgeries or procedures Patient denies ever being told they had issues or difficulty with intubation  No FH of Malignant Hyperthermia Pt is not on diet pills Pt is not on  home 02  Pt is not on blood thinners  Pt states issues with constipation  often- depends on water intake- last colon 11-02-2020 poor prep- 2 day prep per Dr stark  No A fib or A flutter  Pt is fully vaccinated  for Covid   NO PA's for preps discussed with pt In PV today  Discussed with pt there will be an out-of-pocket cost for prep and that varies from $0 to 70 +  dollars - pt verbalized understanding   Due to the COVID-19 pandemic we are asking patients to follow certain guidelines in PV and the [facility]   Pt aware of COVID protocols and [facility] guidelines   PV completed over the phone. Pt verified name, DOB, address and insurance during PV today.  Pt mailed instruction packet with copy of consent form to read and not return, and instructions.  Pt encouraged to call with questions or issues.  If pt has My chart, procedure instructions sent via My Chart

## 2021-02-06 NOTE — Telephone Encounter (Signed)
Pt now has an insulin pump- we need instructions for his colon 02-28-2021 for his pump- pt states pump was started last week- Dr Everardo All manages pump-   Thanks, Hilda Lias PV   Pv done today 02-06-2021

## 2021-02-07 NOTE — Telephone Encounter (Signed)
Informed patient of Dr. George Hugh recommendations. Patient verbalized understanding.

## 2021-02-08 ENCOUNTER — Encounter: Payer: Medicare Other | Admitting: Nutrition

## 2021-02-08 DIAGNOSIS — Z794 Long term (current) use of insulin: Secondary | ICD-10-CM

## 2021-02-08 DIAGNOSIS — E119 Type 2 diabetes mellitus without complications: Secondary | ICD-10-CM | POA: Diagnosis present

## 2021-02-12 ENCOUNTER — Encounter: Payer: Self-pay | Admitting: Endocrinology

## 2021-02-14 ENCOUNTER — Other Ambulatory Visit: Payer: Self-pay | Admitting: Endocrinology

## 2021-02-14 MED ORDER — INSULIN LISPRO 100 UNIT/ML IJ SOLN: INTRAMUSCULAR | 3 refills | Status: DC

## 2021-02-14 NOTE — Patient Instructions (Signed)
Read pump manuel

## 2021-02-14 NOTE — Progress Notes (Signed)
Patient reports no difficulty changing out pods, wearing pods or giving boluses.  He is putting in blood sugar readings with all boluses.  Questions were answered about how and why his basal rate is not working at times despite his blood sugars above the target.   Reviewed all topics on the pump checklist and he signed this with no final questions. He was encouraged to read the manual and to call if questions

## 2021-02-22 ENCOUNTER — Encounter: Payer: Self-pay | Admitting: Gastroenterology

## 2021-02-27 ENCOUNTER — Encounter: Payer: Self-pay | Admitting: Certified Registered Nurse Anesthetist

## 2021-02-27 ENCOUNTER — Telehealth: Payer: Self-pay | Admitting: Gastroenterology

## 2021-02-27 ENCOUNTER — Encounter: Payer: Medicare Other | Admitting: Family Medicine

## 2021-02-27 NOTE — Telephone Encounter (Signed)
Pt called in with questions regarding his insulin pump. Pt was advised based on Dr. George Hugh recommendations that he should continue the same basal rate & skip meal boluses when not on a regular diet. Pt verbalized understanding, no further questions.

## 2021-02-27 NOTE — Telephone Encounter (Signed)
Inbound call from patient with concerns about insulin pump ahead of procedure and questions about bowel movements

## 2021-02-28 ENCOUNTER — Other Ambulatory Visit: Payer: Self-pay

## 2021-02-28 ENCOUNTER — Encounter: Payer: Self-pay | Admitting: Endocrinology

## 2021-02-28 ENCOUNTER — Encounter: Payer: Self-pay | Admitting: Gastroenterology

## 2021-02-28 ENCOUNTER — Ambulatory Visit (AMBULATORY_SURGERY_CENTER): Payer: Medicare Other | Admitting: Gastroenterology

## 2021-02-28 VITALS — BP 136/73 | HR 65 | Temp 98.4°F | Resp 23 | Ht 68.0 in | Wt 140.0 lb

## 2021-02-28 DIAGNOSIS — Z1211 Encounter for screening for malignant neoplasm of colon: Secondary | ICD-10-CM

## 2021-02-28 DIAGNOSIS — K648 Other hemorrhoids: Secondary | ICD-10-CM

## 2021-02-28 DIAGNOSIS — Z538 Procedure and treatment not carried out for other reasons: Secondary | ICD-10-CM

## 2021-02-28 DIAGNOSIS — K640 First degree hemorrhoids: Secondary | ICD-10-CM

## 2021-02-28 HISTORY — PX: COLONOSCOPY WITH PROPOFOL: SHX5780

## 2021-02-28 MED ORDER — SODIUM CHLORIDE 0.9 % IV SOLN: 500.0000 mL | Freq: Once | INTRAVENOUS | Status: DC

## 2021-02-28 NOTE — Op Note (Signed)
[facility] Patient Name: David Maynard Procedure Date: 02/28/2021 8:51 AM MRN: [id number] Endoscopist: Ladene Artist , MD Age: 71 Referring MD:  Date of Birth: 07-Jan-1951 Gender: Male Account #: [id number] Procedure:                Colonoscopy Indications:              Screening for colorectal malignant neoplasm Medicines:                Monitored Anesthesia Care Procedure:                Pre-Anesthesia Assessment:                           - Prior to the procedure, a History and Physical                            was performed, and patient medications and                            allergies were reviewed. The patient's tolerance of                            previous anesthesia was also reviewed. The risks                            and benefits of the procedure and the sedation                            options and risks were discussed with the patient.                            All questions were answered, and informed consent                            was obtained. Prior Anticoagulants: The patient has                            taken no previous anticoagulant or antiplatelet                            agents. ASA Grade Assessment: III - A patient with                            severe systemic disease. After reviewing the risks                            and benefits, the patient was deemed in                            satisfactory condition to undergo the procedure.                           After obtaining informed consent, the colonoscope  was passed under direct vision. Throughout the                            procedure, the patient's blood pressure, pulse, and                            oxygen saturations were monitored continuously. The                            Olympus [id number]) Colonoscope was                            introduced through the anus with the intention of                            advancing to the  cecum. The scope was advanced to                            the hepatic flexure before the procedure was                            aborted. Medications were not given. The rectum was                            photographed. The quality of the bowel preparation                            was inadequate after copious lavage and suction.                            The colonoscopy was technically difficult and                            complex due to inadequate bowel prep, poor                            endoscopic visualization, a redundant colon,                            significant looping and a tortuous colon. The                            patient tolerated the procedure well. Scope In: 8:59:36 AM Scope Out: 9:30:51 AM Scope Withdrawal Time: 0 hours 10 minutes 2 seconds  Total Procedure Duration: 0 hours 31 minutes 15 seconds  Findings:                 The perianal and digital rectal examinations were                            normal.                           A large amount of semi-liquid stool was found in  the rectum, in the sigmoid colon, in the descending                            colon, at the splenic flexure, in the transverse                            colon and at the hepatic flexure, interfering with                            visualization.                           Internal hemorrhoids were found during                            retroflexion. The hemorrhoids were small and Grade                            I (internal hemorrhoids that do not prolapse). Complications:            No immediate complications. Estimated blood loss:                            None. Estimated Blood Loss:     Estimated blood loss: none. Impression:               - Preparation of the colon was inadequate.                           - Stool in the rectum, in the sigmoid colon, in the                            descending colon, at the splenic flexure, in the                             transverse colon and at the hepatic flexure.                           - Internal hemorrhoids.                           - No specimens collected. Recommendation:           - Patient has a contact number available for                            emergencies. The signs and symptoms of potential                            delayed complications were discussed with the                            patient. Return to normal activities tomorrow.                            Written discharge instructions  were provided to the                            patient.                           - Resume previous diet.                           - Continue present medications.                           - Miralax twice daily long term.                           - Perform an air contrast barium enema at                            appointment to be scheduled in 2-4 weeks after a                            more extensive bowel prep. Ladene Artist, MD 02/28/2021 9:36:48 AM This report has been signed electronically.

## 2021-02-28 NOTE — Progress Notes (Signed)
See 11/24/2021 H&P, no changes 

## 2021-02-28 NOTE — Patient Instructions (Signed)
Thank you for allowing is to care for you today. Please use Miralax, 1 capful dissolved in water or other beverage twice per day.  If after one week your bowels are not moving daily or every other day, contact Dr Ardell Isaacs office. Our office will also contact you to schedule a contrast barium enema. Resume other diet and medications today. Return to normal daily activities tomorrow, 03/01/21.      OU HAD AN ENDOSCOPIC PROCEDURE TODAY AT THE [facility]:   Refer to the procedure report that was given to you for any specific questions about what was found during the examination.  If the procedure report does not answer your questions, please call your gastroenterologist to clarify.  If you requested that your care partner not be given the details of your procedure findings, then the procedure report has been included in a sealed envelope for you to review at your convenience later.  YOU SHOULD EXPECT: Some feelings of bloating in the abdomen. Passage of more gas than usual.  Walking can help get rid of the air that was put into your GI tract during the procedure and reduce the bloating. If you had a lower endoscopy (such as a colonoscopy or flexible sigmoidoscopy) you may notice spotting of blood in your stool or on the toilet paper. If you underwent a bowel prep for your procedure, you may not have a normal bowel movement for a few days.  Please Note:  You might notice some irritation and congestion in your nose or some drainage.  This is from the oxygen used during your procedure.  There is no need for concern and it should clear up in a day or so.  SYMPTOMS TO REPORT IMMEDIATELY:  Following lower endoscopy (colonoscopy or flexible sigmoidoscopy):  Excessive amounts of blood in the stool  Significant tenderness or worsening of abdominal pains  Swelling of the abdomen that is new, acute  Fever of 100F or higher   For urgent or emergent issues, a gastroenterologist can be reached at  any hour by calling [phone]. Do not use MyChart messaging for urgent concerns.    DIET:  We do recommend a small meal at first, but then you may proceed to your regular diet.  Drink plenty of fluids but you should avoid alcoholic beverages for 24 hours.  ACTIVITY:  You should plan to take it easy for the rest of today and you should NOT DRIVE or use heavy machinery until tomorrow (because of the sedation medicines used during the test).    FOLLOW UP: Our staff will call the number listed on your records 48-72 hours following your procedure to check on you and address any questions or concerns that you may have regarding the information given to you following your procedure. If we do not reach you, we will leave a message.  We will attempt to reach you two times.  During this call, we will ask if you have developed any symptoms of COVID 19. If you develop any symptoms (ie: fever, flu-like symptoms, shortness of breath, cough etc.) before then, please call [phone].  If you test positive for Covid 19 in the 2 weeks post procedure, please call and report this information to [place].    If any biopsies were taken you will be contacted by phone or by letter within the next 1-3 weeks.  Please call us at [phone] if you have not heard about the biopsies in 3 weeks.    SIGNATURES/CONFIDENTIALITY: You and/or  your care partner have signed paperwork which will be entered into your electronic medical record.  These signatures attest to the fact that that the information above on your After Visit Summary has been reviewed and is understood.  Full responsibility of the confidentiality of this discharge information lies with you and/or your care-partner.

## 2021-02-28 NOTE — Progress Notes (Signed)
Pt's states no medical or surgical changes since previsit or office visit.  ° °Vitals CW °

## 2021-02-28 NOTE — Progress Notes (Signed)
Report given to PACU, vss 

## 2021-03-01 ENCOUNTER — Other Ambulatory Visit: Payer: Self-pay

## 2021-03-01 ENCOUNTER — Telehealth: Payer: Self-pay | Admitting: Nutrition

## 2021-03-01 ENCOUNTER — Telehealth: Payer: Self-pay

## 2021-03-01 DIAGNOSIS — E119 Type 2 diabetes mellitus without complications: Secondary | ICD-10-CM

## 2021-03-01 DIAGNOSIS — Z1211 Encounter for screening for malignant neoplasm of colon: Secondary | ICD-10-CM

## 2021-03-01 MED ORDER — ACCU-CHEK GUIDE ME W/DEVICE KIT: PACK | 0 refills | Status: DC

## 2021-03-01 NOTE — Telephone Encounter (Signed)
Patient reported a very high blood sugar reading (215),after lunch. He had put on a new pod that morning.  He called customer care, and they recommended he put the pod and replace it. He did this and said the cannula never entered his skin.   Discussed high blood sugar protocol again and best sites to use when inserting the pod, as well as the need for site rotation. He reported  that he used a syringe and withdrew the insulin from the pod to save it.  I told this that this was not recommended.  That the insulin may be contaminated or diluted.   He had no final questions.

## 2021-03-01 NOTE — Telephone Encounter (Signed)
Pt needs to be set up for an air contrast barium enema in 2-4 weeks, schedulers have been notified.

## 2021-03-02 ENCOUNTER — Telehealth: Payer: Self-pay | Admitting: *Deleted

## 2021-03-02 ENCOUNTER — Other Ambulatory Visit: Payer: Self-pay | Admitting: Endocrinology

## 2021-03-02 MED ORDER — INSULIN LISPRO 100 UNIT/ML IJ SOLN: INTRAMUSCULAR | 3 refills | Status: DC

## 2021-03-02 NOTE — Telephone Encounter (Signed)
°  Follow up Call-  Call back number 02/28/2021 11/02/2020  Post procedure Call Back phone  # [phone]  Permission to leave phone message Yes Yes  Some recent data might be hidden     Patient questions:  Do you have a fever, pain , or abdominal swelling? No. Pain Score  0 *  Have you tolerated food without any problems? Yes.    Have you been able to return to your normal activities? Yes.    Do you have any questions about your discharge instructions: Diet   No. Medications  No. Follow up visit  No.  Do you have questions or concerns about your Care? No.  Actions: * If pain score is 4 or above: No action needed, pain <4.

## 2021-03-03 NOTE — Telephone Encounter (Signed)
Patient has been scheduled for air contrast barium enema at [facility] on 03/21/21 at 9:30 am. Patient is aware that instructions have been mailed to home address, and MyChart.

## 2021-03-06 ENCOUNTER — Encounter: Payer: Self-pay | Admitting: Endocrinology

## 2021-03-06 ENCOUNTER — Ambulatory Visit (INDEPENDENT_AMBULATORY_CARE_PROVIDER_SITE_OTHER): Payer: Medicare Other | Admitting: Endocrinology

## 2021-03-06 ENCOUNTER — Other Ambulatory Visit: Payer: Self-pay

## 2021-03-06 VITALS — BP 128/74 | HR 76 | Ht 68.0 in | Wt 153.0 lb

## 2021-03-06 DIAGNOSIS — E119 Type 2 diabetes mellitus without complications: Secondary | ICD-10-CM

## 2021-03-06 NOTE — Patient Instructions (Addendum)
Please take these pump settings:   basal rate of 0.2 units/hr.   bolus of 1 unit/8 grams carbohydrate.   correction bolus (which some people call "sensitivity," or "insulin sensitivity ratio," or just "isr") of 1 unit for each 50 by which your glucose exceeds 100.   Please come back for a follow-up appointment in May.

## 2021-03-06 NOTE — Progress Notes (Signed)
Subjective:    Patient ID: David Maynard, male    DOB: December 15, 1950, 71 y.o.   MRN: [id number]  HPI Pt returns for f/u of diabetes mellitus:   DM type: 1 (due to lean body habitus and FHx of type 1).   Dx'ed: 5852 Complications: none.   Therapy: insulin since 2021, Victoza, and 3 oral meds.   DKA: never.  Severe hypoglycemia: never.  Pancreatitis: never.  Other: he takes pump rx since 2023 Interval history: pt states he feels well in general.  I reviewed continuous glucose monitor data.  Glucose varies from 45-230.  It varies more later in the day, but there is little trend throughout the day.   He takes these settings: basal rate of 0.2 units/hr.   bolus of 1 unit/8 grams carbohydrate.   correction bolus (which some people call "sensitivity," or "insulin sensitivity ratio," or just "isr") of 1 unit for each 50 by which your glucose exceeds 110.   He averages a total of 26 units/d (63% bolus).  He takes 3.5 boluses per day.   He has mild hypoglycemia approx 2/week.  This usually happens at HS.  He seldom takes correction bolus.   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   and left   Thyroid disease    Unspecified hypothyroidism     Past Surgical History:  Procedure Laterality Date   COLONOSCOPY     COLONOSCOPY WITH PROPOFOL  02/28/2021   ESOPHAGOGASTRODUODENOSCOPY  06/16/2003   INGUINAL HERNIA REPAIR     left   JOINT REPLACEMENT     KNEE ARTHROSCOPY     Left   KNEE ARTHROSCOPY  W/ ACL RECONSTRUCTION     left   ROTATOR CUFF REPAIR     Right x2   Stress Cardiolite  07/15/2000   TOTAL KNEE ARTHROPLASTY Right 11/04/2017   TOTAL KNEE ARTHROPLASTY Right 11/04/2017   Procedure: TOTAL KNEE ARTHROPLASTY;  Surgeon: Elsie Saas, MD;  Location: [facility];  Service: Orthopedics;  Laterality: Right;   TOTAL KNEE ARTHROPLASTY Left 12/23/2017   TOTAL KNEE ARTHROPLASTY Left 12/23/2017   Procedure: TOTAL KNEE ARTHROPLASTY;  Surgeon: Elsie Saas, MD;  Location: [facility];  Service: Orthopedics;  Laterality: Left;   VASECTOMY  05/2002    Social History   Socioeconomic History   Marital status: Married    Spouse name: Not on file   Number of children: 2   Years of education: Not on file   Highest education level: Not on file  Occupational History   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  Tobacco Use   Smoking status: Never   Smokeless tobacco: Never  Vaping Use   Vaping Use: Never used  Substance and Sexual Activity   Alcohol use: Yes    Alcohol/week: 1.0 standard drink    Types: 1 Cans of beer per week    Comment: weekly   Drug use: No  Sexual activity: Not on file  Other Topics Concern   Not on file  Social History Narrative   Not on file   Social Determinants of Health   Financial Resource Strain: Low Risk    Difficulty of Paying Living Expenses: Not hard at all  Food Insecurity: No Food Insecurity   Worried About Running Out of Food in the Last Year: Never true   [place] in the Last Year: Never true  Transportation Needs: No Transportation Needs   Lack of Transportation (Medical): No   Lack of Transportation (Non-Medical): No  Physical Activity: Sufficiently Active   Days of Exercise per Week: 5 days   Minutes of Exercise per Session: 60 min  Stress: No Stress Concern Present   Feeling of Stress : Not at all  Social Connections: Socially Integrated   Frequency of Communication with Friends and Family: More than three times a week    Frequency of Social Gatherings with Friends and Family: More than three times a week   Attends Religious Services: More than 4 times per year   Active Member of [employer] or Organizations: Yes   Attends Archivist Meetings: 1 to 4 times per year   Marital Status: Married  Human resources officer Violence: Not At Risk   Fear of Current or Ex-Partner: No   Emotionally Abused: No   Physically Abused: No   Sexually Abused: No    Current Outpatient Medications on File Prior to Visit  Medication Sig Dispense Refill   ACCU-CHEK GUIDE test strip USE TO CHECK BLOOD SUGAR 6  TIMES DAILY 600 strip 3   amoxicillin (AMOXIL) 500 MG tablet TAKE FOUR TABLETS BY MOUTH 1 HOUR BEFORE DENTAL WORK 20 tablet 1   ascorbic acid (VITAMIN C) 500 MG tablet Take 1 tablet (500 mg total) by mouth daily. 20 tablet 0   atorvastatin (LIPITOR) 40 MG tablet TAKE 1 TABLET BY MOUTH  DAILY 90 tablet 3   Blood Glucose Monitoring Suppl (ACCU-CHEK GUIDE ME) w/Device KIT USE AS DIRECTED 1 kit 0   Calcium Carbonate (CALCIUM 600 PO) Take 1 tablet by mouth daily.     Cinnamon 500 MG capsule Take 3,000 mg by mouth 2 (two) times daily.     clomiPHENE (CLOMID) 50 MG tablet TAKE ONE-HALF TABLET BY  MOUTH DAILY (Patient taking differently: every other day.) 45 tablet 3   colesevelam (WELCHOL) 625 MG tablet TAKE 4 TABLETS BY MOUTH  DAILY (Patient taking differently: 1,250 mg 2 (two) times daily with a meal. 2 tablets twice a day) 360 tablet 3   Continuous Blood Gluc Sensor (DEXCOM G6 SENSOR) MISC 1 Device by Does not apply route See admin instructions. 1 every 10 days 9 each 3   docusate sodium (COLACE) 100 MG capsule Take 100 mg by mouth 2 (two) times daily. Takes daily     Ferrous Sulfate Dried 45 MG TBCR Take 1 tablet by mouth 2 (two) times daily.     Glucosamine-Chondroit-Vit C-Mn (GLUCOSAMINE CHONDR 1500 COMPLX PO) Take 2 tablets by mouth daily.     insulin lispro (HUMALOG) 100 UNIT/ML injection For use in pump, total of 50 units per  day. 50 mL 3   Insulin Pen Needle 31G X 5 MM MISC 1 Device by Does not apply route in the morning and at bedtime. 200 each 3   levothyroxine (SYNTHROID) 75 MCG tablet TAKE 1 TABLET BY MOUTH  DAILY 90 tablet 3   liraglutide (VICTOZA) 18 MG/3ML SOPN Inject 1.8 mg  into the skin every morning. 27 mL 3   metFORMIN (GLUCOPHAGE-XR) 500 MG 24 hr tablet Take 2 tablets (1,000 mg total) by mouth 2 (two) times daily. 360 tablet 3   Multiple Vitamin (MULTIVITAMIN) tablet Take 1 tablet by mouth daily.     MYRBETRIQ 50 MG TB24 tablet Take 50 mg by mouth daily.     omega-3 acid ethyl esters (LOVAZA) 1 g capsule TAKE 2 CAPSULES BY MOUTH  TWICE DAILY 360 capsule 3   pioglitazone (ACTOS) 30 MG tablet Take 1 tablet (30 mg total) by mouth daily. 90 tablet 3   tadalafil (ADCIRCA/CIALIS) 20 MG tablet Take 20 mg by mouth daily as needed for erectile dysfunction.     Trospium Chloride 60 MG CP24      Turmeric 500 MG CAPS Take 1,500 mg by mouth daily. Occ an extra dose if plays golf or tennis - usually 1,  2 x a day     VITAMIN D, CHOLECALCIFEROL, PO Take by mouth.     vitamin E 200 UNIT capsule Take 200 Units by mouth daily.     No current facility-administered medications on file prior to visit.    No Known Allergies  Family History  Problem Relation Age of Onset   Hypertension Mother    Kidney disease Father    Breast cancer Neg Hx    Celiac disease Neg Hx    Cirrhosis Neg Hx    Clotting disorder Neg Hx    Colitis Neg Hx    Colon cancer Neg Hx    Colon polyps Neg Hx    Crohn's disease Neg Hx    Cystic fibrosis Neg Hx    Diabetes Neg Hx    Esophageal cancer Neg Hx    Heart disease Neg Hx    Hemochromatosis Neg Hx    Inflammatory bowel disease Neg Hx    Irritable bowel syndrome Neg Hx    Liver cancer Neg Hx    Liver disease Neg Hx    Ovarian cancer Neg Hx    Pancreatic cancer Neg Hx    Prostate cancer Neg Hx    Rectal cancer Neg Hx    Stomach cancer Neg Hx    Ulcerative colitis Neg Hx     Uterine cancer Neg Hx    Wilson's disease Neg Hx     BP 128/74 (BP Location: Left Arm, Patient Position: Sitting, Cuff Size: Normal)    Pulse 76    Ht _0  (1.727 m)    Wt 153 lb (69.4 kg)    SpO2 97%    BMI 23.26 kg/m    Review of Systems     Objective:   Physical Exam       Assessment & Plan:  Type 1 DM: overcontrolled.   Hypoglycemia, due to insulin.  We discussed.  He declines to reduce insulin now.    Patient Instructions  Please take these pump settings:   basal rate of 0.2 units/hr.   bolus of 1 unit/8 grams carbohydrate.   correction bolus (which some people call "sensitivity," or "insulin sensitivity ratio," or just "isr") of 1 unit for each 50 by which your glucose exceeds 100.   Please come back for a follow-up appointment in May.

## 2021-03-13 ENCOUNTER — Ambulatory Visit (INDEPENDENT_AMBULATORY_CARE_PROVIDER_SITE_OTHER): Payer: Medicare Other | Admitting: Family Medicine

## 2021-03-13 ENCOUNTER — Encounter: Payer: Self-pay | Admitting: Family Medicine

## 2021-03-13 ENCOUNTER — Other Ambulatory Visit: Payer: Self-pay

## 2021-03-13 VITALS — BP 130/85 | HR 89 | Temp 98.2°F | Ht 68.0 in | Wt 151.2 lb

## 2021-03-13 DIAGNOSIS — E785 Hyperlipidemia, unspecified: Secondary | ICD-10-CM

## 2021-03-13 DIAGNOSIS — E039 Hypothyroidism, unspecified: Secondary | ICD-10-CM

## 2021-03-13 DIAGNOSIS — Z125 Encounter for screening for malignant neoplasm of prostate: Secondary | ICD-10-CM | POA: Diagnosis not present

## 2021-03-13 DIAGNOSIS — Z0001 Encounter for general adult medical examination with abnormal findings: Secondary | ICD-10-CM

## 2021-03-13 DIAGNOSIS — E119 Type 2 diabetes mellitus without complications: Secondary | ICD-10-CM

## 2021-03-13 DIAGNOSIS — E1169 Type 2 diabetes mellitus with other specified complication: Secondary | ICD-10-CM | POA: Diagnosis not present

## 2021-03-13 DIAGNOSIS — G5603 Carpal tunnel syndrome, bilateral upper limbs: Secondary | ICD-10-CM

## 2021-03-13 DIAGNOSIS — Z79899 Other long term (current) drug therapy: Secondary | ICD-10-CM | POA: Diagnosis not present

## 2021-03-13 DIAGNOSIS — K219 Gastro-esophageal reflux disease without esophagitis: Secondary | ICD-10-CM

## 2021-03-13 DIAGNOSIS — N529 Male erectile dysfunction, unspecified: Secondary | ICD-10-CM | POA: Diagnosis not present

## 2021-03-13 DIAGNOSIS — N401 Enlarged prostate with lower urinary tract symptoms: Secondary | ICD-10-CM

## 2021-03-13 DIAGNOSIS — R202 Paresthesia of skin: Secondary | ICD-10-CM

## 2021-03-13 LAB — CBC
HCT: 36.9 % — ABNORMAL LOW (ref 39.0–52.0)
Hemoglobin: 12.5 g/dL — ABNORMAL LOW (ref 13.0–17.0)
MCHC: 33.9 g/dL (ref 30.0–36.0)
MCV: 88.9 fl (ref 78.0–100.0)
Platelets: 183 10*3/uL (ref 150.0–400.0)
RBC: 4.15 Mil/uL — ABNORMAL LOW (ref 4.22–5.81)
RDW: 14.4 % (ref 11.5–15.5)
WBC: 6.1 10*3/uL (ref 4.0–10.5)

## 2021-03-13 LAB — LIPID PANEL
Cholesterol: 131 mg/dL (ref 0–200)
HDL: 85.1 mg/dL (ref >39.00)
LDL Cholesterol: 35 mg/dL (ref 0–99)
NonHDL: 45.43
Total CHOL/HDL Ratio: 2
Triglycerides: 50 mg/dL (ref 0.0–149.0)
VLDL: 10 mg/dL (ref 0.0–40.0)

## 2021-03-13 LAB — MICROALBUMIN / CREATININE URINE RATIO
Creatinine,U: 26.3 mg/dL
Microalb Creat Ratio: 8.9 mg/g (ref 0.0–30.0)
Microalb, Ur: 2.3 mg/dL — ABNORMAL HIGH (ref 0.0–1.9)

## 2021-03-13 LAB — COMPREHENSIVE METABOLIC PANEL
ALT: 20 U/L (ref 0–53)
AST: 29 U/L (ref 0–37)
Albumin: 4.2 g/dL (ref 3.5–5.2)
Alkaline Phosphatase: 52 U/L (ref 39–117)
BUN: 23 mg/dL (ref 6–23)
CO2: 31 mEq/L (ref 19–32)
Calcium: 9.4 mg/dL (ref 8.4–10.5)
Chloride: 99 mEq/L (ref 96–112)
Creatinine, Ser: 0.96 mg/dL (ref 0.40–1.50)
GFR: 79.99 mL/min (ref >60.00)
Glucose, Bld: 103 mg/dL — ABNORMAL HIGH (ref 70–99)
Potassium: 4 mEq/L (ref 3.5–5.1)
Sodium: 134 mEq/L — ABNORMAL LOW (ref 135–145)
Total Bilirubin: 0.5 mg/dL (ref 0.2–1.2)
Total Protein: 6.3 g/dL (ref 6.0–8.3)

## 2021-03-13 LAB — PSA: PSA: 3.26 ng/mL (ref 0.10–4.00)

## 2021-03-13 LAB — TSH: TSH: 5.04 u[IU]/mL (ref 0.35–5.50)

## 2021-03-13 LAB — VITAMIN D 25 HYDROXY (VIT D DEFICIENCY, FRACTURES): VITD: 53.94 ng/mL (ref 30.00–100.00)

## 2021-03-13 LAB — VITAMIN B12: Vitamin B-12: 161 pg/mL — ABNORMAL LOW (ref 211–911)

## 2021-03-13 NOTE — Patient Instructions (Addendum)
It was very nice to see you today!  I think you have carpal tunnel syndrome.  Please try using the cock up wrist ups.  We will check blood work and a urine sample today.  I will see back in year for your next annual checkup.  Come back sooner if needed.  Take care, Dr Jimmey Ralph  PLEASE NOTE:  If you had any lab tests please let us know if you have not heard back within a few days. You may see your results on mychart before we have a chance to review them but we will give you a call once they are reviewed by [place]. If we ordered any referrals today, please let us know if you have not heard from their office within the next week.   Please try these tips to maintain a healthy lifestyle:  Eat at least 3 REAL meals and 1-2 snacks per day.  Aim for no more than 5 hours between eating.  If you eat breakfast, please do so within one hour of getting up.   Each meal should contain half fruits/vegetables, one quarter protein, and one quarter carbs (no bigger than a computer mouse)  Cut down on sweet beverages. This includes juice, soda, and sweet tea.   Drink at least 1 glass of water with each meal and aim for at least 8 glasses per day  Exercise at least 150 minutes every week.    Preventive Care 74 Years and Older, Male Preventive care refers to lifestyle choices and visits with your health care provider that can promote health and wellness. Preventive care visits are also called wellness exams. What can I expect for my preventive care visit? Counseling During your preventive care visit, your health care provider may ask about your: Medical history, including: Past medical problems. Family medical history. History of falls. Current health, including: Emotional well-being. Home life and relationship well-being. Sexual activity. Memory and ability to understand (cognition). Lifestyle, including: Alcohol, nicotine or tobacco, and drug use. Access to firearms. Diet, exercise, and sleep  habits. Work and work Astronomer. Sunscreen use. Safety issues such as seatbelt and bike helmet use. Physical exam Your health care provider will check your: Height and weight. These may be used to calculate your BMI (body mass index). BMI is a measurement that tells if you are at a healthy weight. Waist circumference. This measures the distance around your waistline. This measurement also tells if you are at a healthy weight and may help predict your risk of certain diseases, such as type 2 diabetes and high blood pressure. Heart rate and blood pressure. Body temperature. Skin for abnormal spots. What immunizations do I need? Vaccines are usually given at various ages, according to a schedule. Your health care provider will recommend vaccines for you based on your age, medical history, and lifestyle or other factors, such as travel or where you work. What tests do I need? Screening Your health care provider may recommend screening tests for certain conditions. This may include: Lipid and cholesterol levels. Diabetes screening. This is done by checking your blood sugar (glucose) after you have not eaten for a while (fasting). Hepatitis C test. Hepatitis B test. HIV (human immunodeficiency virus) test. STI (sexually transmitted infection) testing, if you are at risk. Lung cancer screening. Colorectal cancer screening. Prostate cancer screening. Abdominal aortic aneurysm (AAA) screening. You may need this if you are a current or former smoker. Talk with your health care provider about your test results, treatment options, and if necessary,  the need for more tests. Follow these instructions at home: Eating and drinking  Eat a diet that includes fresh fruits and vegetables, whole grains, lean protein, and low-fat dairy products. Limit your intake of foods with high amounts of sugar, saturated fats, and salt. Take vitamin and mineral supplements as recommended by your health care  provider. Do not drink alcohol if your health care provider tells you not to drink. If you drink alcohol: Limit how much you have to 0-2 drinks a day. Know how much alcohol is in your drink. In the [place] one drink equals one 12 oz bottle of beer (355 mL), one 5 oz glass of wine (148 mL), or one 1 oz glass of hard liquor (44 mL). Lifestyle Brush your teeth every morning and night with fluoride toothpaste. Floss one time each day. Exercise for at least 30 minutes 5 or more days each week. Do not use any products that contain nicotine or tobacco. These products include cigarettes, chewing tobacco, and vaping devices, such as e-cigarettes. If you need help quitting, ask your health care provider. Do not use drugs. If you are sexually active, practice safe sex. Use a condom or other form of protection to prevent STIs. Take aspirin only as told by your health care provider. Make sure that you understand how much to take and what form to take. Work with your health care provider to find out whether it is safe and beneficial for you to take aspirin daily. Ask your health care provider if you need to take a cholesterol-lowering medicine (statin). Find healthy ways to manage stress, such as: Meditation, yoga, or listening to music. Journaling. Talking to a trusted person. Spending time with friends and family. Safety Always wear your seat belt while driving or riding in a vehicle. Do not drive: If you have been drinking alcohol. Do not ride with someone who has been drinking. When you are tired or distracted. While texting. If you have been using any mind-altering substances or drugs. Wear a helmet and other protective equipment during sports activities. If you have firearms in your house, make sure you follow all gun safety procedures. Minimize exposure to UV radiation to reduce your risk of skin cancer. What's next? Visit your health care provider once a year for an annual wellness visit. Ask  your health care provider how often you should have your eyes and teeth checked. Stay up to date on all vaccines. This information is not intended to replace advice given to you by your health care provider. Make sure you discuss any questions you have with your health care provider. Document Revised: 07/06/2020 Document Reviewed: 07/06/2020 Elsevier Patient Education  2022 [employer].

## 2021-03-13 NOTE — Progress Notes (Signed)
Chief Complaint:  David Maynard is a 71 y.o. male who presents today for his annual comprehensive physical exam.    Assessment/Plan:  New/Acute Problems: Carpal Tunnel Syndrome No red flags.  Positive Tinel's sign.  He will try splinting for a few weeks.  He already has an established orthopedist.  If this continues to be an issue he will discuss with him.  Chronic Problems Addressed Today: Dyslipidemia associated with type 2 diabetes mellitus (HCC) Check lipids. Continue atorvastatin 40mg  daily.   Hypothyroidism Follows with endocrinology. Check TSH. On synthroid 26mcg daily.   Gastroesophageal reflux disease Continue omeprazole as needed.   ED (erectile dysfunction) Continue management per urology.   Diabetes mellitus, type II ([place]) Continue management per endocrinology.   BPH  Follows with urology.    Preventative Healthcare: Check labs.  Up-to-date on vaccines.  Patient Counseling(The following topics were reviewed and/or handout was given):  -Nutrition: Stressed importance of moderation in sodium/caffeine intake, saturated fat and cholesterol, caloric balance, sufficient intake of fresh fruits, vegetables, and fiber.  -Stressed the importance of regular exercise.   -Substance Abuse: Discussed cessation/primary prevention of tobacco, alcohol, or other drug use; driving or other dangerous activities under the influence; availability of treatment for abuse.   -Injury prevention: Discussed safety belts, safety helmets, smoke detector, smoking near bedding or upholstery.   -Sexuality: Discussed sexually transmitted diseases, partner selection, use of condoms, avoidance of unintended pregnancy and contraceptive alternatives.   -Dental health: Discussed importance of regular tooth brushing, flossing, and dental visits.  -Health maintenance and immunizations reviewed. Please refer to Health maintenance section.  Return to care in 1 year for next preventative visit.      Subjective:  HPI:  He has no acute complaints today.   He has noticed some numbness and tingling in his left arm. Occurs mostly while sleeping. Gets better after he works up. Sometimes gets worse when driving.   Lifestyle Diet: Balanced. Plenty of fruits and vegetables.  Exercise: Likes playing tennis and golf routinely.   Depression screen [facility] 2/9 09/12/2020  Decreased Interest 0  Down, Depressed, Hopeless 0  PHQ - 2 Score 0  Some recent data might be hidden    Health Maintenance Due  Topic Date Due   URINE MICROALBUMIN  04/15/2019   COVID-19 Vaccine (4 - Booster for Moderna series) 02/22/2020   OPHTHALMOLOGY EXAM  12/28/2020     ROS: Per HPI, otherwise a complete review of systems was negative.   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   and left   Thyroid disease    Unspecified hypothyroidism    Patient Active Problem List   Diagnosis Date Noted   Umbilical hernia without obstruction and without gangrene 02/23/2020   Gastroesophageal reflux disease 01/13/2019   BPH  01/08/2018   ED (erectile dysfunction) 01/08/2018   OAB (overactive bladder) 01/08/2018  History of bilateral knee replacement 12/11/2017   Lumbar disc disease 09/22/2010   Diabetes mellitus, type II ([place])    Hypogonadism male 06/19/2007   Dyslipidemia associated with type 2 diabetes mellitus ([place]) 06/19/2007   Hypothyroidism 03/05/2007    Past Surgical History:  Procedure Laterality Date   COLONOSCOPY     COLONOSCOPY WITH PROPOFOL  02/28/2021   ESOPHAGOGASTRODUODENOSCOPY  06/16/2003   INGUINAL HERNIA REPAIR     left   JOINT REPLACEMENT     KNEE ARTHROSCOPY     Left   KNEE ARTHROSCOPY W/ ACL RECONSTRUCTION     left   ROTATOR CUFF REPAIR     Right x2   Stress Cardiolite  07/15/2000   TOTAL KNEE ARTHROPLASTY Right 11/04/2017   TOTAL KNEE ARTHROPLASTY Right 11/04/2017   Procedure: TOTAL KNEE ARTHROPLASTY;  Surgeon: Elsie Saas, MD;  Location: [facility];  Service: Orthopedics;  Laterality: Right;   TOTAL KNEE ARTHROPLASTY Left 12/23/2017   TOTAL KNEE ARTHROPLASTY Left 12/23/2017   Procedure: TOTAL KNEE ARTHROPLASTY;  Surgeon: Elsie Saas, MD;  Location: [facility];  Service: Orthopedics;  Laterality: Left;   VASECTOMY  05/2002    Family History  Problem Relation Age of Onset   Hypertension Mother    Kidney disease Father    Breast cancer Neg Hx    Celiac disease Neg Hx    Cirrhosis Neg Hx    Clotting disorder Neg Hx    Colitis Neg Hx    Colon cancer Neg Hx    Colon polyps Neg Hx    Crohn's disease Neg Hx    Cystic fibrosis Neg Hx    Diabetes Neg Hx    Esophageal cancer Neg Hx    Heart disease Neg Hx    Hemochromatosis Neg Hx    Inflammatory bowel disease Neg Hx    Irritable bowel syndrome Neg Hx    Liver cancer Neg Hx    Liver disease Neg Hx    Ovarian cancer Neg Hx    Pancreatic cancer Neg Hx    Prostate cancer Neg Hx    Rectal cancer Neg Hx    Stomach cancer Neg Hx    Ulcerative colitis Neg Hx    Uterine cancer Neg Hx    Wilson's disease Neg Hx     Medications- reviewed and updated Current Outpatient Medications  Medication Sig Dispense Refill   ACCU-CHEK GUIDE test strip USE TO CHECK BLOOD SUGAR 6  TIMES DAILY 600 strip 3   amoxicillin (AMOXIL) 500 MG tablet TAKE FOUR TABLETS BY MOUTH 1 HOUR BEFORE DENTAL WORK 20 tablet 1   ascorbic acid (VITAMIN C) 500 MG tablet Take 1 tablet (500 mg total)  by mouth daily. 20 tablet 0   aspirin EC 81 MG tablet Take 81 mg by mouth daily. Swallow whole.     atorvastatin (LIPITOR) 40 MG tablet TAKE 1 TABLET BY MOUTH  DAILY 90 tablet 3   Blood Glucose Monitoring Suppl (ACCU-CHEK GUIDE ME) w/Device KIT USE AS DIRECTED 1 kit 0   Calcium Carbonate (CALCIUM 600 PO) Take 1 tablet by mouth daily.     Cinnamon 500 MG capsule Take 3,000 mg by mouth 2 (two) times daily.     clomiPHENE (CLOMID) 50 MG tablet TAKE ONE-HALF TABLET BY  MOUTH DAILY (Patient taking differently: every other day.) 45 tablet 3   colesevelam (WELCHOL) 625 MG tablet TAKE 4 TABLETS BY MOUTH  DAILY (Patient taking differently: 1,250 mg 2 (two) times daily with a meal. 2 tablets  twice a day) 360 tablet 3   Continuous Blood Gluc Sensor (DEXCOM G6 SENSOR) MISC 1 Device by Does not apply route See admin instructions. 1 every 10 days 9 each 3   docusate sodium (COLACE) 100 MG capsule Take 100 mg by mouth 2 (two) times daily. Takes daily     Glucosamine-Chondroit-Vit C-Mn (GLUCOSAMINE CHONDR 1500 COMPLX PO) Take 2 tablets by mouth daily.     insulin lispro (HUMALOG) 100 UNIT/ML injection For use in pump, total of 50 units per day. 50 mL 3   Insulin Pen Needle 31G X 5 MM MISC 1 Device by Does not apply route in the morning and at bedtime. 200 each 3   levothyroxine (SYNTHROID) 75 MCG tablet TAKE 1 TABLET BY MOUTH  DAILY 90 tablet 3   liraglutide (VICTOZA) 18 MG/3ML SOPN Inject 1.8 mg into the skin every morning. 27 mL 3   metFORMIN (GLUCOPHAGE-XR) 500 MG 24 hr tablet Take 2 tablets (1,000 mg total) by mouth 2 (two) times daily. 360 tablet 3   Multiple Vitamin (MULTIVITAMIN) tablet Take 1 tablet by mouth daily.     MYRBETRIQ 50 MG TB24 tablet Take 50 mg by mouth daily.     omega-3 acid ethyl esters (LOVAZA) 1 g capsule TAKE 2 CAPSULES BY MOUTH  TWICE DAILY 360 capsule 3   pioglitazone (ACTOS) 30 MG tablet Take 1 tablet (30 mg total) by mouth daily. 90 tablet 3   tadalafil (ADCIRCA/CIALIS) 20 MG  tablet Take 20 mg by mouth daily as needed for erectile dysfunction.     Trospium Chloride 60 MG CP24      Turmeric 500 MG CAPS Take 1,500 mg by mouth daily. Occ an extra dose if plays golf or tennis - usually 1,  2 x a day     VITAMIN D, CHOLECALCIFEROL, PO Take by mouth.     vitamin E 200 UNIT capsule Take 200 Units by mouth daily.     Ferrous Sulfate Dried 45 MG TBCR Take 1 tablet by mouth 2 (two) times daily. (Patient not taking: Reported on 03/13/2021)     No current facility-administered medications for this visit.    Allergies-reviewed and updated No Known Allergies  Social History   Socioeconomic History   Marital status: Married    Spouse name: Not on file   Number of children: 2   Years of education: Not on file   Highest education level: Not on file  Occupational History   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  Tobacco Use   Smoking status: Never   Smokeless tobacco: Never  Vaping Use   Vaping Use: Never used  Substance and Sexual Activity   Alcohol use: Yes    Alcohol/week: 1.0 standard drink    Types: 1 Cans of beer per week    Comment: weekly   Drug use: No   Sexual activity: Not on file  Other Topics Concern   Not on file  Social History Narrative   Not on file   Social Determinants of Health   Financial Resource Strain: Low Risk    Difficulty of Paying Living Expenses: Not hard at all  Food Insecurity: No Food Insecurity   Worried About Charity fundraiser in the Last Year: Never true   [place] in the Last Year: Never true  Transportation Needs: No Transportation Needs   Lack of Transportation (Medical): No   Lack of Transportation (Non-Medical): No  Physical Activity: Sufficiently Active  Days of Exercise per Week: 5 days   Minutes of Exercise per Session: 60 min  Stress: No Stress Concern Present   Feeling of Stress : Not at all  Social Connections: Socially Integrated   Frequency of Communication with Friends and Family:  More than three times a week   Frequency of Social Gatherings with Friends and Family: More than three times a week   Attends Religious Services: More than 4 times per year   Active Member of [employer] or Organizations: Yes   Attends Archivist Meetings: 1 to 4 times per year   Marital Status: Married        Objective:  Physical Exam: BP 130/85    Pulse 89    Temp 98.2 F (36.8 C) (Temporal)    Ht $R'5\' 8"'XM$  (1.727 m)    Wt 151 lb 3.2 oz (68.6 kg)    SpO2 96%    BMI 22.99 kg/m   Body mass index is 22.99 kg/m. Wt Readings from Last 3 Encounters:  03/13/21 151 lb 3.2 oz (68.6 kg)  03/06/21 153 lb (69.4 kg)  02/28/21 140 lb (63.5 kg)   Gen: NAD, resting comfortably HEENT: TMs normal bilaterally. OP clear. No thyromegaly noted.  CV: RRR with no murmurs appreciated Pulm: NWOB, CTAB with no crackles, wheezes, or rhonchi GI: Normal bowel sounds present. Soft, Nontender, Nondistended. MSK: no edema, cyanosis, or clubbing noted - Wrist: Positive Tinel sign at bilateral wrist.  Left greater than right.  Neurovascular intact distally. Skin: warm, dry Neuro: CN2-12 grossly intact. Strength 5/5 in upper and lower extremities. Reflexes symmetric and intact bilaterally.  Psych: Normal affect and thought content     Cheyeanne Roadcap M. Jerline Pain, MD 03/13/2021 1:54 PM

## 2021-03-13 NOTE — Assessment & Plan Note (Signed)
Follows with endocrinology. Check TSH. On synthroid daily.

## 2021-03-13 NOTE — Assessment & Plan Note (Signed)
Continue management per urology. 

## 2021-03-13 NOTE — Assessment & Plan Note (Signed)
Continue management per endocrinology. 

## 2021-03-13 NOTE — Assessment & Plan Note (Addendum)
Check lipids.  Continue atorvastatin 40 mg daily. 

## 2021-03-13 NOTE — Assessment & Plan Note (Signed)
Continue omeprazole as needed. 

## 2021-03-13 NOTE — Assessment & Plan Note (Signed)
Follows with urology

## 2021-03-15 NOTE — Progress Notes (Signed)
Please inform patient of the following:  His B12 is low but all of his other labs are NORMAL. Recommend he start B12 injections here and we can recheck in 3-6 months. We can recheck everything else in a year.  David Maynard. Jerline Pain, MD 03/15/2021 8:03 AM

## 2021-03-16 ENCOUNTER — Other Ambulatory Visit: Payer: Self-pay

## 2021-03-16 ENCOUNTER — Telehealth: Payer: Self-pay | Admitting: Gastroenterology

## 2021-03-16 NOTE — Telephone Encounter (Signed)
Dr. Russella Dar, patient is scheduled for air contrast barium enema on 2/28, and the prep calls for magnesium citrate which is currently on back order. Per radiology, patient will need Golytely ordered. Please advise if okay to proceed.

## 2021-03-16 NOTE — Telephone Encounter (Signed)
OK please order our standand split dose Golytely prep.

## 2021-03-16 NOTE — Telephone Encounter (Signed)
Inbound call from patients wife stated that she went to the pharmacy to get prep medication for patients  procedure on 2/28 and they did not have 10 oz.  Magnesium Citrate Bottle. Stated that they do not make that anymore. Seeking advice what the alternative is for that. Please advise.

## 2021-03-17 ENCOUNTER — Other Ambulatory Visit: Payer: Self-pay

## 2021-03-17 MED ORDER — PEG 3350-KCL-NA BICARB-NACL 420 G PO SOLR: 4000.0000 mL | Freq: Once | ORAL | 0 refills | Status: AC

## 2021-03-17 NOTE — Telephone Encounter (Signed)
Patient returned call & is aware of of his new instructions that are being sent through MyChart and to pick up Golytely at pharmacy. He was advised to follow the same insulin instructions as previously from his colon prep recently.

## 2021-03-21 ENCOUNTER — Other Ambulatory Visit: Payer: Self-pay

## 2021-03-21 ENCOUNTER — Ambulatory Visit (HOSPITAL_COMMUNITY)
Admission: RE | Admit: 2021-03-21 | Discharge: 2021-03-21 | Disposition: A | Discharge status: Home / Self Care | Payer: Medicare Other | Source: Ambulatory Visit | Attending: Gastroenterology | Admitting: Gastroenterology

## 2021-03-21 DIAGNOSIS — Z1211 Encounter for screening for malignant neoplasm of colon: Secondary | ICD-10-CM | POA: Insufficient documentation

## 2021-03-22 ENCOUNTER — Encounter: Payer: Self-pay | Admitting: Nutrition

## 2021-03-22 ENCOUNTER — Ambulatory Visit (INDEPENDENT_AMBULATORY_CARE_PROVIDER_SITE_OTHER): Payer: Medicare Other

## 2021-03-22 ENCOUNTER — Telehealth: Payer: Self-pay

## 2021-03-22 DIAGNOSIS — E538 Deficiency of other specified B group vitamins: Secondary | ICD-10-CM | POA: Diagnosis not present

## 2021-03-22 MED ORDER — CYANOCOBALAMIN 1000 MCG/ML IJ SOLN
1000.0000 ug | Freq: Once | INTRAMUSCULAR | Status: AC
  Administered 2021-03-22: 1000 ug via INTRAMUSCULAR

## 2021-03-22 NOTE — Telephone Encounter (Signed)
Please advise 

## 2021-03-22 NOTE — Progress Notes (Signed)
Pt came in office and received b12 today with no problem. ?

## 2021-03-22 NOTE — Telephone Encounter (Signed)
Pt came into office today to receive his b12 injections. He would like to start doing these at home, states he spoke with Dr. Jinger Neighbors nurse last time to have this setup but never got a call back. Please f/u with pt regarding this. ?

## 2021-03-23 ENCOUNTER — Other Ambulatory Visit: Payer: Self-pay

## 2021-03-23 ENCOUNTER — Other Ambulatory Visit: Payer: Self-pay | Admitting: Family Medicine

## 2021-03-23 MED ORDER — CYANOCOBALAMIN 1000 MCG/ML IJ SOLN: 1000.0000 ug | INTRAMUSCULAR | 2 refills | Status: DC

## 2021-03-23 NOTE — Telephone Encounter (Signed)
Sent in Rx for B-12 injections earlier, please advise ?

## 2021-03-23 NOTE — Telephone Encounter (Signed)
Yes he can do these at home. Please send in rx for b12 injections once monthly. ? ?Katina Degree. Jimmey Ralph, MD ?03/23/2021 7:57 AM  ? ?

## 2021-03-23 NOTE — Telephone Encounter (Signed)
Pt verified DOB, advised sending in Rx to pharmacy. Pt verbalized understanding ?

## 2021-04-03 ENCOUNTER — Encounter: Payer: Self-pay | Admitting: Family Medicine

## 2021-04-03 NOTE — Telephone Encounter (Signed)
See attachments provided for Housecall appt recommendations and advise ?

## 2021-04-04 ENCOUNTER — Ambulatory Visit: Payer: Medicare Other | Admitting: Gastroenterology

## 2021-04-04 NOTE — Telephone Encounter (Signed)
He is already currently on aspirin and statin and we just recently saw him. Unless he is having new issues then we do not need to do anything else at this time. ? ?Katina Degree. Jimmey Ralph, MD ?04/04/2021 12:57 PM  ? ?

## 2021-04-05 ENCOUNTER — Encounter: Payer: Self-pay | Admitting: Family Medicine

## 2021-04-06 ENCOUNTER — Other Ambulatory Visit: Payer: Self-pay

## 2021-04-06 MED ORDER — CYANOCOBALAMIN 1000 MCG/ML IJ SOLN: 1000.0000 ug | INTRAMUSCULAR | 0 refills | Status: DC

## 2021-04-17 IMAGING — CT CT CARDIAC CORONARY ARTERY CALCIUM SCORE
3 series · 14 of 20 positions shown, 15 images · non-contrast
Comparison: None.
COMPARISON: None.

Addendum:
EXAM:
OVER-READ INTERPRETATION  CT CHEST

The following report is an over-read performed by radiologist Dr.
Kringsly Vo Van [REDACTED] on 03/30/2020. This
over-read does not include interpretation of cardiac or coronary
anatomy or pathology. The coronary calcium score interpretation by
the cardiologist is attached.
CLINICAL DATA: Risk stratification
Coronary Calcium Score
TECHNIQUE: The patient was scanned on a Siemens Force scanner. Axial
non-contrast 3 mm slices were carried out through the heart. The
data set was analyzed on a dedicated work station and scored using
the Agatson method.

[Series 2: casc 3.0 bv41 2 bestdiast 73 % · axial · 0.38mm/px · z∈[-70,+20]mm · 4 of 51 slices shown, 5 images]
[im 11/51  vessel]
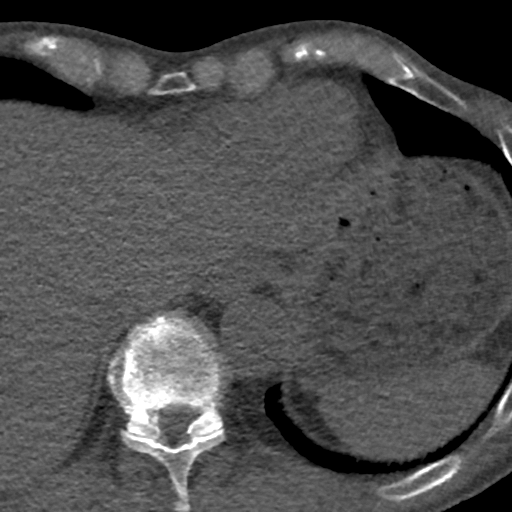
[im 11/51  lung]
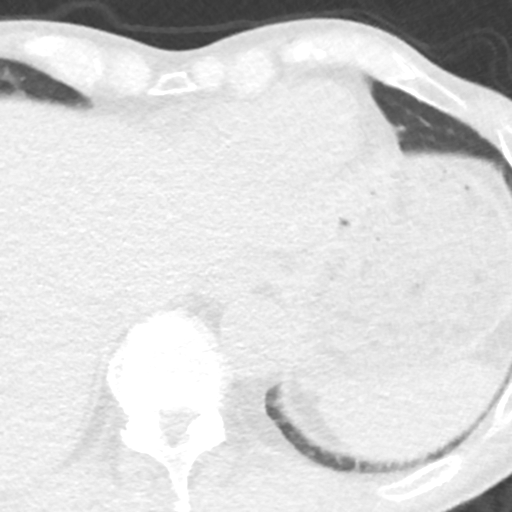
[im 21/51  vessel]
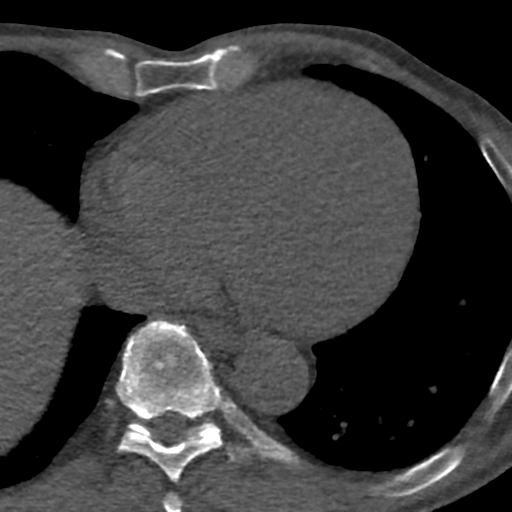
[im 31/51  vessel]
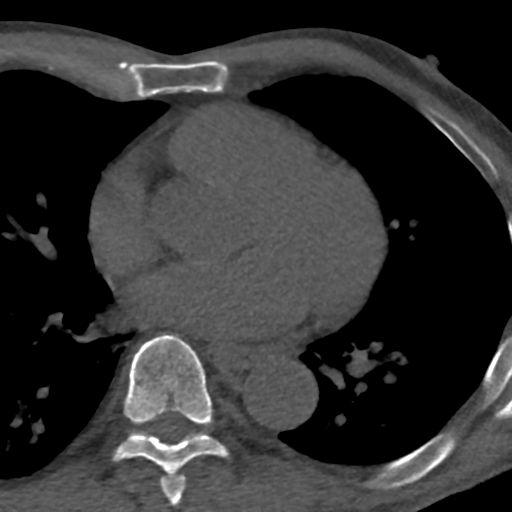
[im 41/51  vessel]
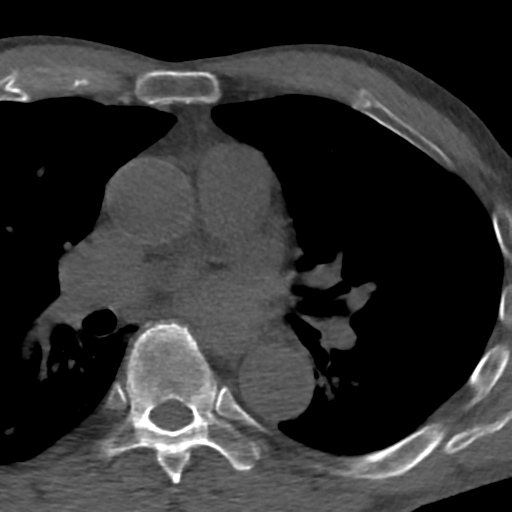

[Series 3: lung 74 % · axial · 0.63mm/px · z∈[-76,+23]mm · 5 of 51 slices shown]
[im 9/51  lung]
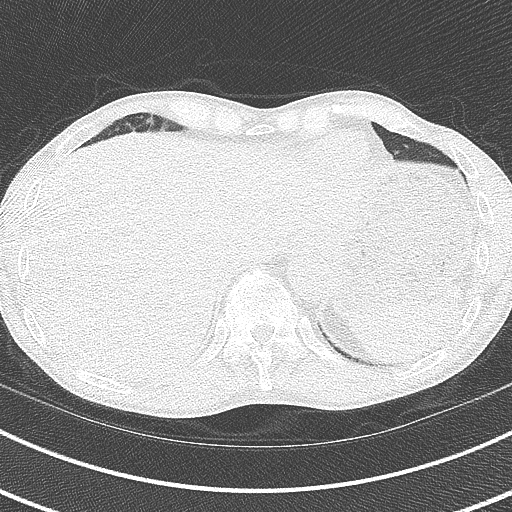
[im 17/51  lung]
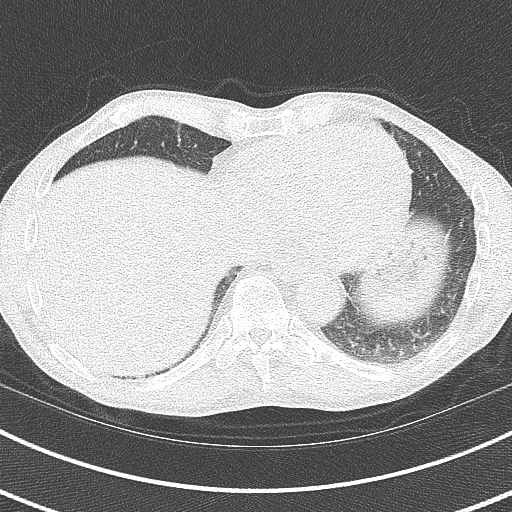
[im 26/51  lung]
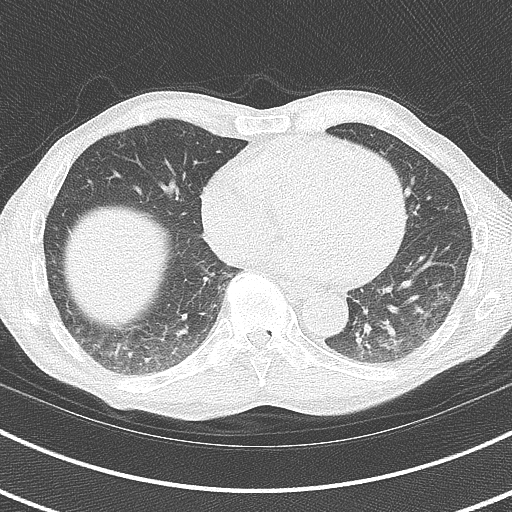
[im 34/51  lung]
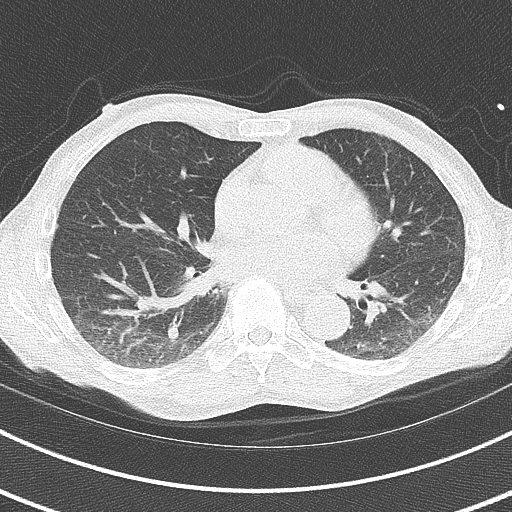
[im 42/51  lung]
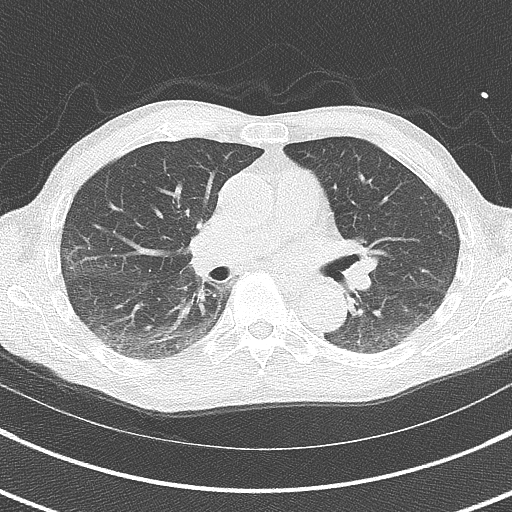

[Series 4: lung st 74 % · axial · 0.63mm/px · z∈[-76,+23]mm · 5 of 51 slices shown]
[im 9/51  lung]
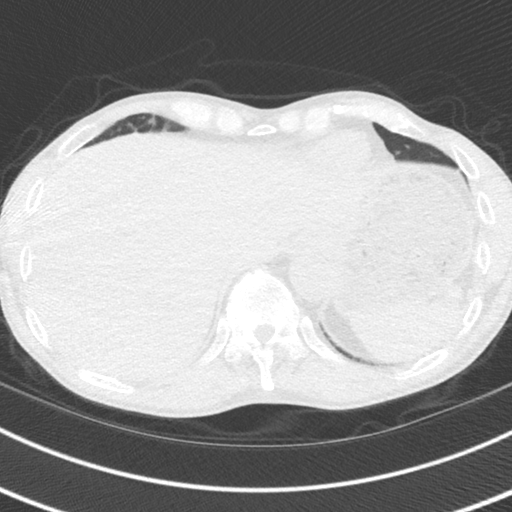
[im 17/51  lung]
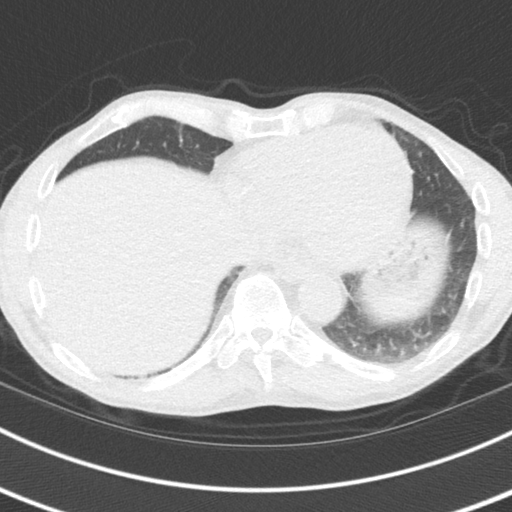
[im 26/51  lung]
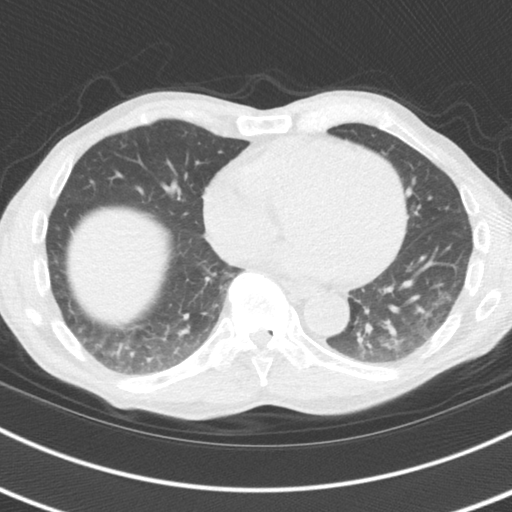
[im 34/51  lung]
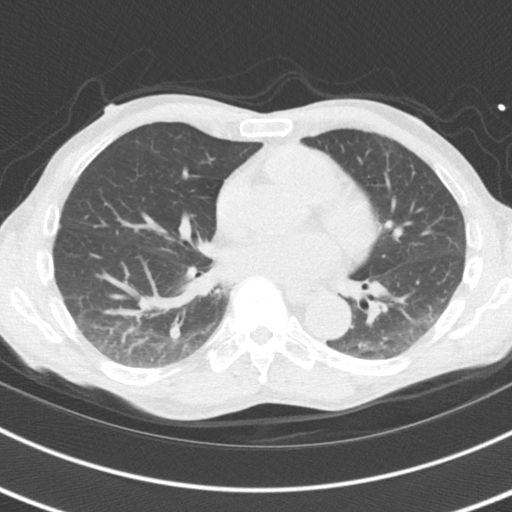
[im 42/51  lung]
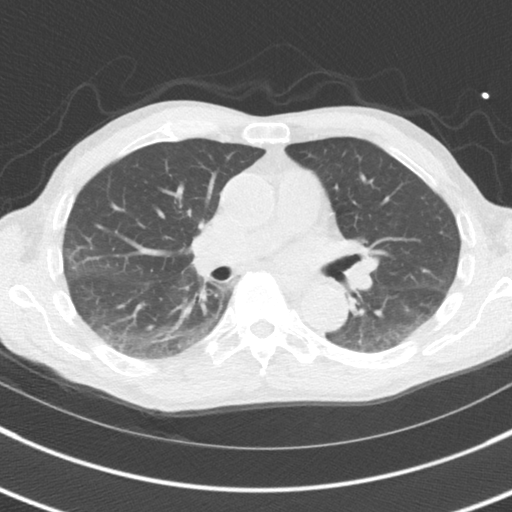

[14 of 20 positions shown; findings below may reference images not displayed]

FINDINGS: Aortic atherosclerosis. Mild subsegmental atelectasis in the lower
lobes of the lungs bilaterally. Within the visualized portions of
the thorax there are no suspicious appearing pulmonary nodules or
masses, there is no acute consolidative airspace disease, no pleural
effusions, no pneumothorax and no lymphadenopathy. Visualized
portions of the upper abdomen are unremarkable. There are no
aggressive appearing lytic or blastic lesions noted in the
visualized portions of the skeleton.
IMPRESSION: 1.  Aortic Atherosclerosis (628A4-370.0).
FINDINGS: Non-cardiac: See separate report from [REDACTED].

Ascending Aorta: Normal size, normal calcifications.

Pericardium: Normal.

Coronary arteries: Normal origin.
IMPRESSION: Coronary calcium score of 258. This was 81 percentile for age and
sex matched control.

*** End of Addendum ***
EXAM:
OVER-READ INTERPRETATION  CT CHEST

The following report is an over-read performed by radiologist Dr.
Kringsly Vo Van [REDACTED] on 03/30/2020. This
over-read does not include interpretation of cardiac or coronary
anatomy or pathology. The coronary calcium score interpretation by
the cardiologist is attached.
FINDINGS: Aortic atherosclerosis. Mild subsegmental atelectasis in the lower
lobes of the lungs bilaterally. Within the visualized portions of
the thorax there are no suspicious appearing pulmonary nodules or
masses, there is no acute consolidative airspace disease, no pleural
effusions, no pneumothorax and no lymphadenopathy. Visualized
portions of the upper abdomen are unremarkable. There are no
aggressive appearing lytic or blastic lesions noted in the
visualized portions of the skeleton.
IMPRESSION: 1.  Aortic Atherosclerosis (628A4-370.0).

## 2021-05-08 ENCOUNTER — Other Ambulatory Visit: Payer: Self-pay | Admitting: Endocrinology

## 2021-06-04 ENCOUNTER — Other Ambulatory Visit: Payer: Self-pay | Admitting: Endocrinology

## 2021-06-04 DIAGNOSIS — E119 Type 2 diabetes mellitus without complications: Secondary | ICD-10-CM

## 2021-06-05 ENCOUNTER — Ambulatory Visit: Payer: Medicare Other | Admitting: Endocrinology

## 2021-06-09 ENCOUNTER — Other Ambulatory Visit: Payer: Self-pay | Admitting: Family Medicine

## 2021-06-09 ENCOUNTER — Other Ambulatory Visit: Payer: Self-pay | Admitting: Endocrinology

## 2021-06-09 ENCOUNTER — Ambulatory Visit (INDEPENDENT_AMBULATORY_CARE_PROVIDER_SITE_OTHER): Payer: Medicare Other | Admitting: Internal Medicine

## 2021-06-09 ENCOUNTER — Encounter: Payer: Self-pay | Admitting: Internal Medicine

## 2021-06-09 VITALS — BP 140/72 | HR 82 | Ht 68.0 in | Wt 153.4 lb

## 2021-06-09 DIAGNOSIS — E039 Hypothyroidism, unspecified: Secondary | ICD-10-CM | POA: Diagnosis not present

## 2021-06-09 DIAGNOSIS — E1169 Type 2 diabetes mellitus with other specified complication: Secondary | ICD-10-CM | POA: Diagnosis not present

## 2021-06-09 DIAGNOSIS — E1159 Type 2 diabetes mellitus with other circulatory complications: Secondary | ICD-10-CM | POA: Diagnosis not present

## 2021-06-09 DIAGNOSIS — E1165 Type 2 diabetes mellitus with hyperglycemia: Secondary | ICD-10-CM

## 2021-06-09 DIAGNOSIS — E119 Type 2 diabetes mellitus without complications: Secondary | ICD-10-CM

## 2021-06-09 DIAGNOSIS — E785 Hyperlipidemia, unspecified: Secondary | ICD-10-CM

## 2021-06-09 LAB — POCT GLYCOSYLATED HEMOGLOBIN (HGB A1C): Hemoglobin A1C: 5.7 % — AB (ref 4.0–5.6)

## 2021-06-09 MED ORDER — GLUCAGON 3 MG/DOSE NA POWD: 3.0000 mg | Freq: Once | NASAL | 11 refills | Status: DC | PRN

## 2021-06-09 MED ORDER — PIOGLITAZONE HCL 15 MG PO TABS: 15.0000 mg | ORAL_TABLET | Freq: Every day | ORAL | 3 refills | Status: DC

## 2021-06-09 NOTE — Progress Notes (Signed)
Patient ID: David Maynard, male   DOB: 1950/06/20, 71 y.o.   MRN: [id number]  HPI: David Maynard is a 71 y.o.-year-old male, returning for follow-up for DM2, diagnosed in 2008, on insulin since 2021, controlled, with complications (CAD).  He previously saw Dr. Loanne Drilling, last visit 3 months ago.  Reviewed HbA1c levels: Lab Results  Component Value Date   HGBA1C 6.2 (A) 02/03/2021   HGBA1C 6.3 (A) 11/21/2020   HGBA1C 6.4 (A) 08/15/2020   Insulin pump:  -Omnipod 5  CGM: -Dexcom G6  Insulin: -Humalog  Supplies: -Dexcom from [employer] -Omnipod from [employer]  Pump settings: - basal rates: 12 am: 0.2 units/h - ICR: 1: 8 >> 7 - target: 110 - ISF: 1: 50 >> 40 - Active insulin time: - extended bolusing: not using - changes infusion site: q3 days TDD from basal insulin: 8.6 units TDD from bolus insulin: 22.7 units Total daily dose: 31-50 units /day  He is also on: - Actos 30 mg in the evening - Victoza 1.8 mg in a.m. - Metformin ER 1000 mg 2x a day  Meter: Accu-Chek guide  Pt checks his sugars more than 4 times a day:  Lowest sugar was 54; he has hypoglycemia awareness at 60.  He does not have a glucagon kit at home.  His wife is an Therapist, sports and NP. Highest sugar was 200s.  - no CKD, last BUN/creatinine:  Lab Results  Component Value Date   BUN 23 03/13/2021   BUN 33 (H) 02/23/2020   CREATININE 0.96 03/13/2021   CREATININE 0.93 02/23/2020   - + HL; last set of lipids: Lab Results  Component Value Date   CHOL 131 03/13/2021   HDL 85.10 03/13/2021   LDLCALC 35 03/13/2021   TRIG 50.0 03/13/2021   CHOLHDL 2 03/13/2021  On Lipitor 40 mg daily, Welchol 650 x 2 tablets twice a day, omega-3 fatty acids 2000 mg twice a day.  - last eye exam was in 2022. No DR. Cataract sx 2021.  His vision improved significantly afterwards.  - no numbness and tingling in his feet.  Last foot exam August 15, 2020.  On ASA 81.  Last TSH: Lab Results  Component Value Date   TSH 5.04  03/13/2021   He is on Levothyroxine 75 mcg daily.  He has family history of type 1 diabetes - son.  He also has a history of ADD, B12 deficiency, BPH, GERD, hypogonadism, osteoarthritis, depression, anemia..  ROS: + see HPI No increased urination, blurry vision, nausea, chest pain.  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   and left   Thyroid disease    Unspecified hypothyroidism    Past Surgical History:  Procedure Laterality Date   COLONOSCOPY     COLONOSCOPY WITH PROPOFOL  02/28/2021   ESOPHAGOGASTRODUODENOSCOPY  06/16/2003   INGUINAL HERNIA REPAIR     left   JOINT REPLACEMENT     KNEE  ARTHROSCOPY     Left   KNEE ARTHROSCOPY W/ ACL RECONSTRUCTION     left   ROTATOR CUFF REPAIR     Right x2   Stress Cardiolite  07/15/2000   TOTAL KNEE ARTHROPLASTY Right 11/04/2017   TOTAL KNEE ARTHROPLASTY Right 11/04/2017   Procedure: TOTAL KNEE ARTHROPLASTY;  Surgeon: Elsie Saas, MD;  Location: [facility];  Service: Orthopedics;  Laterality: Right;   TOTAL KNEE ARTHROPLASTY Left 12/23/2017   TOTAL KNEE ARTHROPLASTY Left 12/23/2017   Procedure: TOTAL KNEE ARTHROPLASTY;  Surgeon: Elsie Saas, MD;  Location: [place];  Service: Orthopedics;  Laterality: Left;   VASECTOMY  05/2002   Social History   Socioeconomic History   Marital status: Married    Spouse name: Not on file   Number of children: 2   Years of education: Not on file   Highest education level: Not on file  Occupational  History   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  Tobacco Use   Smoking status: Never   Smokeless tobacco: Never  Vaping Use   Vaping Use: Never used  Substance and Sexual Activity   Alcohol use: Yes    Alcohol/week: 1.0 standard drink    Types: 1 Cans of beer per week    Comment: weekly   Drug use: No   Sexual activity: Not on file  Other Topics Concern   Not on file  Social History Narrative   Not on file   Social Determinants of Health   Financial Resource Strain: Low Risk    Difficulty of Paying Living Expenses: Not hard at all  Food Insecurity: No Food Insecurity   Worried About Charity fundraiser in the Last Year: Never true   [place] in the Last Year: Never true  Transportation Needs: No Transportation Needs   Lack of Transportation (Medical): No   Lack of Transportation (Non-Medical): No  Physical Activity: Sufficiently Active   Days of Exercise per Week: 5 days   Minutes of Exercise per Session: 60 min  Stress: No Stress Concern Present   Feeling of Stress : Not at all  Social Connections: Socially Integrated   Frequency of Communication with Friends and Family: More than three times a week   Frequency of Social Gatherings with Friends and Family: More than three times a week   Attends Religious Services: More than 4 times per year   Active Member of [employer] or Organizations: Yes   Attends Archivist Meetings: 1 to 4 times per year   Marital Status: Married  Human resources officer Violence: Not At Risk   Fear of Current or Ex-Partner: No   Emotionally Abused: No   Physically Abused: No   Sexually Abused: No   Current Outpatient Medications on File Prior to Visit  Medication Sig Dispense Refill   amoxicillin (AMOXIL) 500 MG tablet TAKE FOUR TABLETS BY MOUTH 1 HOUR BEFORE DENTAL WORK 20 tablet 1   ascorbic acid (VITAMIN C) 500 MG tablet Take 1 tablet (500 mg total) by mouth daily. 20 tablet 0   aspirin EC 81 MG tablet Take 81 mg by mouth  daily. Swallow whole.     atorvastatin (LIPITOR) 40 MG tablet TAKE 1 TABLET BY MOUTH  DAILY 90 tablet 3   Blood Glucose Monitoring Suppl (ACCU-CHEK GUIDE ME) w/Device KIT USE AS DIRECTED 1 kit 0   Calcium Carbonate (CALCIUM 600 PO) Take 1 tablet by mouth daily.     Cinnamon 500 MG capsule Take  3,000 mg by mouth 2 (two) times daily.     clomiPHENE (CLOMID) 50 MG tablet TAKE ONE-HALF TABLET BY  MOUTH DAILY (Patient taking differently: every other day.) 45 tablet 3   colesevelam (WELCHOL) 625 MG tablet TAKE 4 TABLETS BY MOUTH  DAILY (Patient taking differently: 1,250 mg 2 (two) times daily with a meal. 2 tablets twice a day) 360 tablet 3   Continuous Blood Gluc Sensor (DEXCOM G6 SENSOR) MISC 1 Device by Does not apply route See admin instructions. 1 every 10 days 9 each 3   cyanocobalamin (DODEX) 1000 MCG/ML injection Inject 1 mL (1,000 mcg total) into the muscle every 30 (thirty) days. 3 mL 0   docusate sodium (COLACE) 100 MG capsule Take 100 mg by mouth 2 (two) times daily. Takes daily     Glucosamine-Chondroit-Vit C-Mn (GLUCOSAMINE CHONDR 1500 COMPLX PO) Take 2 tablets by mouth daily.     insulin lispro (HUMALOG) 100 UNIT/ML injection For use in pump, total of 50 units per day. 50 mL 3   Insulin Pen Needle 31G X 5 MM MISC 1 Device by Does not apply route in the morning and at bedtime. 200 each 3   levothyroxine (SYNTHROID) 75 MCG tablet TAKE 1 TABLET BY MOUTH  DAILY 90 tablet 3   metFORMIN (GLUCOPHAGE-XR) 500 MG 24 hr tablet TAKE 2 TABLETS BY MOUTH  TWICE DAILY 360 tablet 0   Multiple Vitamin (MULTIVITAMIN) tablet Take 1 tablet by mouth daily.     MYRBETRIQ 50 MG TB24 tablet Take 50 mg by mouth daily.     omega-3 acid ethyl esters (LOVAZA) 1 g capsule TAKE 2 CAPSULES BY MOUTH  TWICE DAILY 360 capsule 3   tadalafil (ADCIRCA/CIALIS) 20 MG tablet Take 20 mg by mouth daily as needed for erectile dysfunction.     Trospium Chloride 60 MG CP24      Turmeric 500 MG CAPS Take 1,500 mg by mouth daily. Occ  an extra dose if plays golf or tennis - usually 1,  2 x a day     VICTOZA 18 MG/3ML SOPN INJECT SUBCUTANEOUSLY 1.8  MG IN THE MORNING 27 mL 3   VITAMIN D, CHOLECALCIFEROL, PO Take by mouth.     vitamin E 200 UNIT capsule Take 200 Units by mouth daily.     No current facility-administered medications on file prior to visit.   No Known Allergies Family History  Problem Relation Age of Onset   Hypertension Mother    Kidney disease Father    Breast cancer Neg Hx    Celiac disease Neg Hx    Cirrhosis Neg Hx    Clotting disorder Neg Hx    Colitis Neg Hx    Colon cancer Neg Hx    Colon polyps Neg Hx    Crohn's disease Neg Hx    Cystic fibrosis Neg Hx    Diabetes Neg Hx    Esophageal cancer Neg Hx    Heart disease Neg Hx    Hemochromatosis Neg Hx    Inflammatory bowel disease Neg Hx    Irritable bowel syndrome Neg Hx    Liver cancer Neg Hx    Liver disease Neg Hx    Ovarian cancer Neg Hx    Pancreatic cancer Neg Hx    Prostate cancer Neg Hx    Rectal cancer Neg Hx    Stomach cancer Neg Hx    Ulcerative colitis Neg Hx    Uterine cancer Neg Hx    Wilson's disease Neg  Hx    PE: BP 140/72 (BP Location: Left Arm, Patient Position: Sitting, Cuff Size: Normal)   Pulse 82   Ht $R'5\' 8"'IK$  (1.727 m)   Wt 153 lb 6.4 oz (69.6 kg)   SpO2 98%   BMI 23.32 kg/m  Wt Readings from Last 3 Encounters:  06/09/21 153 lb 6.4 oz (69.6 kg)  03/13/21 151 lb 3.2 oz (68.6 kg)  03/06/21 153 lb (69.4 kg)   Constitutional: normal weight, in NAD Eyes: PERRLA, EOMI, no exophthalmos ENT: moist mucous membranes, no thyromegaly, no cervical lymphadenopathy Cardiovascular: RRR, No MRG Respiratory: CTA B Musculoskeletal: no deformities, strength intact in all 4 Skin: moist, warm, no rashes Neurological: no tremor with outstretched hands, DTR normal in all 4  ASSESSMENT: 1. DM2, controlled, with complications - CAD  2. HL  PLAN:  1. Patient with long-standing, very well controlled DM1.  Latest HbA1c  was 6.2% in 01/2021. CGM interpretation: -At today's visit, we reviewed his CGM downloads: It appears that 98% of values are in target range (goal >70%), while 1% are higher than 180 (goal <25%), and 1% are lower than 70 (goal <4%).  The calculated average blood sugar is 110.  The projected HbA1c for the next 3 months (GMI) is 5.9%. -Reviewing the CGM trends, sugars appear to be extremely well controlled without hyper or hypoglycemia with almost all values in the target range.  He had very low basal rates, and apparently underwent some insulin from boluses during the day, but this appears to work well for him for now.  However, if he is not in the hybrid closed-loop, the basal rates may  be insufficient.  We did discuss about the algorithm starts the basal rates at the recommended levels and then adjust them as needed, however, in the manual mode, he will stay at 0.2 units an hour, which may not be sufficient.  He is on other diabetic medications: Metformin, Actos, and Victoza, which can also help with blood sugar control, so for now, I did not suggest to increase the basal rates.  We can try this at next visit.  For now, I did suggest to try to reduce the dose dose, after discussion about possible side effects.  Regarding boluses, he is doing an excellent job entering carbs and starting the boluses 15 minutes before the meals.  His insulin to carb ratios appear to be adequate.  Would not change this. -At this visit, we did discuss about the use of glucagon and, since he is on an insulin pump, I did suggest to try to obtain this in case he cannot correct hypoglycemia and still he is not alert.  His wife is an Therapist, sports and is following his Dexcom and calls him in case of low blood sugars. 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  His sugars are decreasing slightly during tennis, but not enough to start the pump or to change to the exercise mode.  He tried to use the exercise mode but did  not like the higher target.  He also plays golf. I advised him to: Patient Instructions  Please use the following pump settings: - basal rates: 12 am: 0.2 units/h - ICR: 1:7 - target: 110 - ISF: 1:40 - Active insulin time: 4h  Please continue: - Metformin ER 1000 mg 2x a day - Victoza 1.8 mg in a.m.  Please reduce: - Actos 15 mg daily  Please return in 6 months.  - Continue check  at least 4 times a day, rotating checks - given foot care handout  - given instructions for hypoglycemia management "15-15 rule"  - advised for yearly eye exams-he is up-to-date - sent glucagon kit Rx to pharmacy - no signs of other autoimmune disorders - Return to clinic in 6 months (he prefers to come every 3 months to find out his HbA1c)  2. HL - Reviewed latest lipid panel: Fractions at goal: Lab Results  Component Value Date   CHOL 131 03/13/2021   HDL 85.10 03/13/2021   LDLCALC 35 03/13/2021   TRIG 50.0 03/13/2021   CHOLHDL 2 03/13/2021  - Continues Lipitor 40 mg daily, WelChol 1300 mg twice a day, omega-3 fatty acids 2000 mg twice a day without side effects.   3.  Hypothyroidism - latest thyroid labs reviewed with pt. >> normal: Lab Results  Component Value Date   TSH 5.04 03/13/2021  - he continues on LT4 75 mcg daily - pt feels good on this dose. - We will repeat his TFTs at next visit.  - Total time spent for the visit: 40 min, in precharting, obtaining medical information from the chart, reviewing Dr. Cordelia Pen last note, his  previous labs, evaluations, and treatments, reviewing his symptoms, counseling him about his conditions, in particular his diabetes (please see the discussed topics above), and developing a plan to further investigate and treat them.  Philemon Kingdom, MD PhD [facility] Endocrinology

## 2021-06-09 NOTE — Patient Instructions (Addendum)
Please use the following pump settings: - basal rates: 12 am: 0.2 units/h - ICR: 1:7 - target: 110 - ISF: 1:40 - Active insulin time: 4h  Please continue: - Metformin ER 1000 mg 2x a day - Victoza 1.8 mg in a.m.  Please reduce: - Actos 15 mg daily  Please return in 6 months.  Basic Rules for Patients with Type I Diabetes Mellitus  The American Diabetes Association (ADA) recommended targets: - fasting sugar 80-130 - after meal sugar <180 - HbA1C <7%  Engage in ?150 min moderate exercise per week  Make sure you have ?8h of sleep every night as this helps both blood sugars and your weight.  Always keep a sugar log (not only record in your meter) and bring it to all appointments with [place].  If you are on a pump, know how to access the settings and to modify the parameters.   Remember, you can always call the number on the back of the pump for emergencies related to the pump.  "15-15 rule" for hypoglycemia: if sugars are low, take 15 g of carbs** ("fast sugar" - e.g. 4 glucose tablets, 4 oz orange juice), wait 15 min, then check sugars again. If still <80, repeat. Continue  until your sugars >80, then eat a normal meal.   Teach family members and coworkers to inject glucagon. Have a glucagon set at home and one at work. They should call 911 after using the set.  If you are on a pump, set "insulin on board" or "active insulin time" time for 5 hours (if your sugars tend to be higher, can use 4 hours).   If you are on a pump, use the "dual wave bolus" setting for high fat foods (e.g. pizza). Start with a setting of 50%-50% (50% instant bolus and 50% prolonged bolus over 3h, for e.g.).    If you are on a pump, make sure the basal daily insulin dose is approximately equal (not larger) to the daily insulin you get from boluses, otherwise you are at risk for hypoglycemia.  Check sugar before driving. If <100, correct, and only start driving if sugars rise ?710. Check sugar every hour  when on a long drive.  Check sugar before exercising. If <100, correct, and only start exercising if sugars rise ?100. Check sugar every hour when on a long exercise routine and 1h after you finished exercising.

## 2021-06-25 ENCOUNTER — Encounter (INDEPENDENT_AMBULATORY_CARE_PROVIDER_SITE_OTHER): Payer: Medicare Other | Admitting: Internal Medicine

## 2021-06-25 DIAGNOSIS — E1159 Type 2 diabetes mellitus with other circulatory complications: Secondary | ICD-10-CM

## 2021-06-27 ENCOUNTER — Other Ambulatory Visit: Payer: Self-pay | Admitting: Internal Medicine

## 2021-06-27 MED ORDER — OZEMPIC (0.25 OR 0.5 MG/DOSE) 2 MG/1.5ML ~~LOC~~ SOPN: 0.5000 mg | PEN_INJECTOR | SUBCUTANEOUS | 5 refills | Status: DC

## 2021-06-27 NOTE — Telephone Encounter (Signed)

## 2021-07-02 ENCOUNTER — Encounter: Payer: Self-pay | Admitting: Internal Medicine

## 2021-07-13 ENCOUNTER — Encounter: Payer: Self-pay | Admitting: Family

## 2021-07-13 ENCOUNTER — Ambulatory Visit (INDEPENDENT_AMBULATORY_CARE_PROVIDER_SITE_OTHER): Payer: Medicare Other | Admitting: Family

## 2021-07-13 VITALS — BP 119/64 | HR 75 | Temp 97.9°F | Ht 68.0 in | Wt 149.2 lb

## 2021-07-13 DIAGNOSIS — J019 Acute sinusitis, unspecified: Secondary | ICD-10-CM

## 2021-07-13 MED ORDER — AMOXICILLIN-POT CLAVULANATE 875-125 MG PO TABS: 1.0000 | ORAL_TABLET | Freq: Two times a day (BID) | ORAL | 0 refills | Status: DC

## 2021-07-13 NOTE — Progress Notes (Signed)
Subjective:     Patient ID: David Maynard, male    DOB: [birth year], 71 y.o.   MRN: [id number]  Chief Complaint  Patient presents with   Sinus Problem    Pt c/o nasal congestion/ nasal drip , cough and body aches. Present for 2 weeks. Has tried cold and flu which did help a little.    HPI: Sinusitis: Patient complains of achiness, congestion, nasal congestion, post nasal drip, productive cough with  yellow and green colored sputum, and sore throat, with no fever, chills, night sweats or weight loss. Onset of symptoms was 2 weeks ago, gradually worsening since that time. He is drinking moderate amounts of fluids.  Past history is significant for no history of pneumonia or bronchitis. Patient is non-smoker. Has been taking generic dayquil with mild relief.   Assessment & Plan:   Problem List Items Addressed This Visit   None Visit Diagnoses     Acute non-recurrent sinusitis, unspecified location    -  Primary sending Augmentin, advised on use & SE, ok to take Probiotic, OTC to prevent possible diarrhea, continue OTC sinus meds, Tylenol prn.    Relevant Medications   amoxicillin-clavulanate (AUGMENTIN) 875-125 MG tablet      Outpatient Medications Prior to Visit  Medication Sig Dispense Refill   ACCU-CHEK GUIDE test strip USE TO CHECK BLOOD SUGAR 6  TIMES DAILY 600 strip 3   ascorbic acid (VITAMIN C) 500 MG tablet Take 1 tablet (500 mg total) by mouth daily. 20 tablet 0   aspirin EC 81 MG tablet Take 81 mg by mouth daily. Swallow whole.     atorvastatin (LIPITOR) 40 MG tablet TAKE 1 TABLET BY MOUTH  DAILY 90 tablet 3   Blood Glucose Monitoring Suppl (ACCU-CHEK GUIDE ME) w/Device KIT USE AS DIRECTED 1 kit 0   Calcium Carbonate (CALCIUM 600 PO) Take 1 tablet by mouth daily.     Cinnamon 500 MG capsule Take 3,000 mg by mouth 2 (two) times daily.     clomiPHENE (CLOMID) 50 MG tablet TAKE ONE-HALF TABLET BY  MOUTH DAILY (Patient taking differently: every other day.) 45 tablet 3    colesevelam (WELCHOL) 625 MG tablet TAKE 4 TABLETS BY MOUTH  DAILY (Patient taking differently: 1,250 mg 2 (two) times daily with a meal. 2 tablets twice a day) 360 tablet 3   Continuous Blood Gluc Sensor (DEXCOM G6 SENSOR) MISC 1 Device by Does not apply route See admin instructions. 1 every 10 days 9 each 3   cyanocobalamin (DODEX) 1000 MCG/ML injection Inject 1 mL (1,000 mcg total) into the muscle every 30 (thirty) days. 3 mL 0   docusate sodium (COLACE) 100 MG capsule Take 100 mg by mouth 2 (two) times daily. Takes daily     Glucagon 3 MG/DOSE POWD Place 3 mg into the nose once as needed for up to 1 dose. 1 each 11   Glucosamine-Chondroit-Vit C-Mn (GLUCOSAMINE CHONDR 1500 COMPLX PO) Take 2 tablets by mouth daily.     insulin lispro (HUMALOG) 100 UNIT/ML injection For use in pump, total of 50 units per day. 50 mL 3   Insulin Pen Needle 31G X 5 MM MISC 1 Device by Does not apply route in the morning and at bedtime. 200 each 3   levothyroxine (SYNTHROID) 75 MCG tablet TAKE 1 TABLET BY MOUTH  DAILY 90 tablet 3   metFORMIN (GLUCOPHAGE-XR) 500 MG 24 hr tablet TAKE 2 TABLETS BY MOUTH  TWICE DAILY 360 tablet 0   Multiple  Vitamin (MULTIVITAMIN) tablet Take 1 tablet by mouth daily.     MYRBETRIQ 50 MG TB24 tablet Take 50 mg by mouth daily.     omega-3 acid ethyl esters (LOVAZA) 1 g capsule TAKE 2 CAPSULES BY MOUTH  TWICE DAILY 360 capsule 3   pioglitazone (ACTOS) 15 MG tablet Take 1 tablet (15 mg total) by mouth daily. 90 tablet 3   Semaglutide,0.25 or 0.5MG/DOS, (OZEMPIC, 0.25 OR 0.5 MG/DOSE,) 2 MG/1.5ML SOPN Inject 0.5 mg into the skin once a week. 4.5 mL 5   tadalafil (ADCIRCA/CIALIS) 20 MG tablet Take 20 mg by mouth daily as needed for erectile dysfunction.     Trospium Chloride 60 MG CP24      Turmeric 500 MG CAPS Take 1,500 mg by mouth daily. Occ an extra dose if plays golf or tennis - usually 1,  2 x a day     VITAMIN D, CHOLECALCIFEROL, PO Take by mouth.     vitamin E 200 UNIT capsule Take 200  Units by mouth daily.     amoxicillin (AMOXIL) 500 MG tablet TAKE FOUR TABLETS BY MOUTH 1 HOUR BEFORE DENTAL WORK 20 tablet 1   No facility-administered medications prior to visit.    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   and left   Thyroid disease    Unspecified hypothyroidism     Past Surgical History:  Procedure Laterality Date   COLONOSCOPY     COLONOSCOPY WITH PROPOFOL  02/28/2021   ESOPHAGOGASTRODUODENOSCOPY  06/16/2003   INGUINAL HERNIA REPAIR     left   JOINT REPLACEMENT     KNEE ARTHROSCOPY     Left   KNEE ARTHROSCOPY W/ ACL RECONSTRUCTION     left   ROTATOR CUFF REPAIR     Right x2   Stress Cardiolite  07/15/2000   TOTAL KNEE ARTHROPLASTY Right 11/04/2017   TOTAL KNEE ARTHROPLASTY Right 11/04/2017   Procedure: TOTAL KNEE ARTHROPLASTY;  Surgeon: Elsie Saas, MD;  Location: [place];  Service: Orthopedics;  Laterality: Right;   TOTAL KNEE ARTHROPLASTY Left 12/23/2017   TOTAL KNEE ARTHROPLASTY Left 12/23/2017   Procedure: TOTAL KNEE ARTHROPLASTY;  Surgeon: Elsie Saas, MD;  Location: [place];  Service: Orthopedics;  Laterality: Left;   VASECTOMY  05/2002    No Known Allergies     Objective:    Physical Exam Vitals and nursing note reviewed.  Constitutional:      General: He is not in acute distress.    Appearance: Normal appearance.  HENT:     Head: Normocephalic.     Right Ear: Tympanic membrane and ear canal normal.     Left Ear: Tympanic membrane and  ear canal normal.     Nose:     Right Sinus: Frontal sinus tenderness (mild) present.     Left Sinus: Frontal sinus tenderness (mild) present.  Cardiovascular:     Rate and Rhythm: Normal rate and regular  rhythm.  Pulmonary:     Effort: Pulmonary effort is normal.     Breath sounds: Examination of the right-lower field reveals wheezing. Examination of the left-lower field reveals wheezing. Wheezing (mild) present.  Musculoskeletal:        General: Normal range of motion.     Cervical back: Normal range of motion.  Skin:    General: Skin is warm and dry.  Neurological:     Mental Status: He is alert and oriented to person, place, and time.  Psychiatric:        Mood and Affect: Mood normal.    BP 119/64 (BP Location: Left Arm, Patient Position: Sitting, Cuff Size: Large)   Pulse 75   Temp 97.9 F (36.6 C) (Temporal)   Ht 5' 8" (1.727 m)   Wt 149 lb 4 oz (67.7 kg)   SpO2 97%   BMI 22.69 kg/m  Wt Readings from Last 3 Encounters:  07/13/21 149 lb 4 oz (67.7 kg)  06/09/21 153 lb 6.4 oz (69.6 kg)  03/13/21 151 lb 3.2 oz (68.6 kg)       Meds ordered this encounter  Medications   DISCONTD: amoxicillin-clavulanate (AUGMENTIN) 875-125 MG tablet    Sig: Take 1 tablet by mouth 2 (two) times daily.    Dispense:  20 tablet    Refill:  0    Order Specific Question:   Supervising Provider    Answer:   ANDY, CAMILLE L [[id number]]   amoxicillin-clavulanate (AUGMENTIN) 875-125 MG tablet    Sig: Take 1 tablet by mouth 2 (two) times daily.    Dispense:  14 tablet    Refill:  0    IGNORE previous RX just sent, thanks    Order Specific Question:   Supervising Provider    Answer:   ANDY, CAMILLE L [[id number]]    Jeanie Sewer, NP

## 2021-07-13 NOTE — Patient Instructions (Signed)
It was very nice to see you today!   I have sent Augmentin for you to take twice a day for your infection. Be sure to eat something first prior to taking.  Can also cause diarrhea, start a probiotic daily, over the counter, to help prevent this, or ok to take Imodium AD, over the counter per package directions, if needed. OK to continue your over the counter sinus medications or Tylenol as needed.   PLEASE NOTE:  If you had any lab tests please let us know if you have not heard back within a few days. You may see your results on MyChart before we have a chance to review them but we will give you a call once they are reviewed by [place]. If we ordered any referrals today, please let us know if you have not heard from their office within the next week.

## 2021-08-07 ENCOUNTER — Other Ambulatory Visit: Payer: Self-pay | Admitting: Internal Medicine

## 2021-08-07 ENCOUNTER — Other Ambulatory Visit: Payer: Self-pay | Admitting: Family Medicine

## 2021-08-07 DIAGNOSIS — E119 Type 2 diabetes mellitus without complications: Secondary | ICD-10-CM

## 2021-08-09 ENCOUNTER — Encounter: Payer: Self-pay | Admitting: Internal Medicine

## 2021-08-11 ENCOUNTER — Encounter: Payer: Self-pay | Admitting: Family Medicine

## 2021-08-14 ENCOUNTER — Other Ambulatory Visit: Payer: Self-pay | Admitting: *Deleted

## 2021-08-15 ENCOUNTER — Other Ambulatory Visit: Payer: Self-pay | Admitting: *Deleted

## 2021-08-15 MED ORDER — "LUER LOCK SAFETY SYRINGES 25G X 1"" 3 ML MISC": 0 refills | Status: DC

## 2021-08-18 ENCOUNTER — Encounter: Payer: Self-pay | Admitting: Internal Medicine

## 2021-08-21 ENCOUNTER — Other Ambulatory Visit: Payer: Self-pay | Admitting: Internal Medicine

## 2021-08-21 DIAGNOSIS — E1165 Type 2 diabetes mellitus with hyperglycemia: Secondary | ICD-10-CM

## 2021-08-21 NOTE — Progress Notes (Signed)
Referral diab  

## 2021-08-23 ENCOUNTER — Other Ambulatory Visit: Payer: Self-pay | Admitting: Internal Medicine

## 2021-08-23 ENCOUNTER — Encounter: Payer: Medicare Other | Attending: Internal Medicine | Admitting: Nutrition

## 2021-08-23 DIAGNOSIS — E1159 Type 2 diabetes mellitus with other circulatory complications: Secondary | ICD-10-CM | POA: Diagnosis present

## 2021-08-23 DIAGNOSIS — E119 Type 2 diabetes mellitus without complications: Secondary | ICD-10-CM

## 2021-08-23 DIAGNOSIS — E1165 Type 2 diabetes mellitus with hyperglycemia: Secondary | ICD-10-CM | POA: Diagnosis present

## 2021-08-23 NOTE — Progress Notes (Signed)
Patient reports that the sent him a new PDM and he needs help with putting in the new settings.  Also reports that blood sugars are still rising from 5AM to 8 AM despite moving his medication per Dr.Gherghe's orders.   Settings were transferred from his Old PDM and basal rate was increased by 0.05u/hr. from 5AM to 7AM.  Basal rate: 0.3u/hr, 5AM: 0.35u/hr, 7AM: 0.3u/hr.  I/C: 7,  ISF:  40,  target 110 with correction over 110.  Timing 4 hours.   He filled a pod and attached it to his lowere left abdomen.  There is much scare tissue here, and he was advised to move this Pod to his arm or on his left side.  Different sites were shown to him to use, but he wished to continue to use this area.  He had no final questions.

## 2021-08-23 NOTE — Patient Instructions (Signed)
Change pod placement sites q 3 days

## 2021-09-07 ENCOUNTER — Encounter: Payer: Self-pay | Admitting: Internal Medicine

## 2021-09-07 DIAGNOSIS — E119 Type 2 diabetes mellitus without complications: Secondary | ICD-10-CM

## 2021-09-08 ENCOUNTER — Other Ambulatory Visit: Payer: Self-pay | Admitting: Family Medicine

## 2021-09-08 MED ORDER — ACCU-CHEK GUIDE ME W/DEVICE KIT: PACK | 0 refills | Status: DC

## 2021-09-11 ENCOUNTER — Other Ambulatory Visit: Payer: Self-pay

## 2021-09-11 ENCOUNTER — Ambulatory Visit (INDEPENDENT_AMBULATORY_CARE_PROVIDER_SITE_OTHER): Payer: Medicare Other | Admitting: Internal Medicine

## 2021-09-11 ENCOUNTER — Encounter: Payer: Self-pay | Admitting: Internal Medicine

## 2021-09-11 VITALS — BP 120/64 | HR 81 | Ht 68.0 in | Wt 144.4 lb

## 2021-09-11 DIAGNOSIS — E119 Type 2 diabetes mellitus without complications: Secondary | ICD-10-CM

## 2021-09-11 DIAGNOSIS — E785 Hyperlipidemia, unspecified: Secondary | ICD-10-CM

## 2021-09-11 DIAGNOSIS — E1169 Type 2 diabetes mellitus with other specified complication: Secondary | ICD-10-CM

## 2021-09-11 DIAGNOSIS — E039 Hypothyroidism, unspecified: Secondary | ICD-10-CM

## 2021-09-11 LAB — POCT GLYCOSYLATED HEMOGLOBIN (HGB A1C): Hemoglobin A1C: 5.7 % — AB (ref 4.0–5.6)

## 2021-09-11 MED ORDER — LEVOTHYROXINE SODIUM 75 MCG PO TABS: 75.0000 ug | ORAL_TABLET | Freq: Every day | ORAL | 3 refills | Status: DC

## 2021-09-11 NOTE — Addendum Note (Signed)
Addended by: Kenyon Ana on: 09/11/2021 02:32 PM   Modules accepted: Orders

## 2021-09-11 NOTE — Progress Notes (Signed)
Patient ID: David Maynard, male   DOB: December 02, 1950, 71 y.o.   MRN: [id number]  HPI: David Maynard is a 71 y.o.-year-old male, returning for follow-up for DM2, diagnosed in 2008, on insulin since 2021, controlled, with complications (CAD).  He previously saw Dr. Loanne Drilling, last visit 3 months ago.  Reviewed HbA1c levels: Lab Results  Component Value Date   HGBA1C 5.7 (A) 06/09/2021   HGBA1C 6.2 (A) 02/03/2021   HGBA1C 6.3 (A) 11/21/2020   Insulin pump:  -Omnipod 5  CGM: -Dexcom G6  Insulin: -Humalog  Supplies: -Dexcom from [employer] -Omnipod from [employer]  Pump settings: - basal rates: 12 am: 0.2 units/h - ICR: 1: 8 >> 7 - target: 110 - ISF: 1: 50 >> 40 - Active insulin time: - extended bolusing: not using - changes infusion site: q3 days TDD from basal insulin: 8.6 >> 10.3 units (28%) TDD from bolus insulin: 22.7 >> 26.4 units (72%) Total daily dose: 37-50 units /day  He is also on: - Actos 30 >> 15 mg in the evening - Victoza 1.8 mg in a.m. - Metformin ER 1000 mg 2x a day  Meter: Accu-Chek guide  Pt checks his sugars more than 4 times a day:  Previously:  Lowest sugar was 54; he has hypoglycemia awareness at 60.  He does not have a glucagon kit at home.  His wife is an Therapist, sports and NP. Highest sugar was 200s >> 206.  - no CKD, last BUN/creatinine:  Lab Results  Component Value Date   BUN 23 03/13/2021   BUN 33 (H) 02/23/2020   CREATININE 0.96 03/13/2021   CREATININE 0.93 02/23/2020   - + HL; last set of lipids: Lab Results  Component Value Date   CHOL 131 03/13/2021   HDL 85.10 03/13/2021   LDLCALC 35 03/13/2021   TRIG 50.0 03/13/2021   CHOLHDL 2 03/13/2021  On Lipitor 40 mg daily, Welchol 650 x 2 tablets twice a day, omega-3 fatty acids 2000 mg twice a day.  - last eye exam was in 11/2020. No DR. Cataract sx 2021.  His vision improved significantly afterwards.  - no numbness and tingling in his feet.  Last foot exam August 15, 2020.  On ASA 81.  Last  TSH: Lab Results  Component Value Date   TSH 5.04 03/13/2021   He is on Levothyroxine 75 mcg daily.  He has family history of type 1 diabetes - son.  He also has a history of ADD, B12 deficiency, BPH, GERD, hypogonadism, osteoarthritis, depression, anemia..  ROS: + see HPI No increased urination, blurry vision, nausea, chest pain.  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   and left   Thyroid disease    Unspecified hypothyroidism    Past Surgical History:  Procedure Laterality Date   COLONOSCOPY     COLONOSCOPY WITH PROPOFOL  02/28/2021   ESOPHAGOGASTRODUODENOSCOPY  06/16/2003   INGUINAL HERNIA REPAIR  left   JOINT REPLACEMENT     KNEE ARTHROSCOPY     Left   KNEE ARTHROSCOPY W/ ACL RECONSTRUCTION     left   ROTATOR CUFF REPAIR     Right x2   Stress Cardiolite  07/15/2000   TOTAL KNEE ARTHROPLASTY Right 11/04/2017   TOTAL KNEE ARTHROPLASTY Right 11/04/2017   Procedure: TOTAL KNEE ARTHROPLASTY;  Surgeon: Elsie Saas, MD;  Location: [facility];  Service: Orthopedics;  Laterality: Right;   TOTAL KNEE ARTHROPLASTY Left 12/23/2017   TOTAL KNEE ARTHROPLASTY Left 12/23/2017   Procedure: TOTAL KNEE ARTHROPLASTY;  Surgeon: Elsie Saas, MD;  Location: [facility];  Service: Orthopedics;  Laterality: Left;   VASECTOMY  05/2002   Social History   Socioeconomic History   Marital status: Married    Spouse name: Not on file   Number of children: 2   Years of education: Not on file    Highest education level: Not on file  Occupational History   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  Tobacco Use   Smoking status: Never   Smokeless tobacco: Never  Vaping Use   Vaping Use: Never used  Substance and Sexual Activity   Alcohol use: Yes    Alcohol/week: 1.0 standard drink of alcohol    Types: 1 Cans of beer per week    Comment: weekly   Drug use: No   Sexual activity: Not on file  Other Topics Concern   Not on file  Social History Narrative   Not on file   Social Determinants of Health   Financial Resource Strain: Low Risk  (09/12/2020)   Overall Financial Resource Strain (CARDIA)    Difficulty of Paying Living Expenses: Not hard at all  Food Insecurity: No Food Insecurity (09/12/2020)   Hunger Vital Sign    Worried About Running Out of Food in the Last Year: Never true    [place] in the Last Year: Never true  Transportation Needs: No Transportation Needs (09/12/2020)   PRAPARE - Hydrologist (Medical): No    Lack of Transportation (Non-Medical): No  Physical Activity: Sufficiently Active (09/12/2020)   Exercise Vital Sign    Days of Exercise per Week: 5 days    Minutes of Exercise per Session: 60 min  Stress: No Stress Concern Present (09/12/2020)   [place]    Feeling of Stress : Not at all  Social Connections: [place] (09/12/2020)   Social Connection and Isolation Panel [NHANES]    Frequency of Communication with Friends and Family: More than three times a week    Frequency of Social Gatherings with Friends and Family: More than three times a week    Attends Religious Services: More than 4 times per year    Active Member of [employer] or Organizations: Yes    Attends Archivist Meetings: 1 to 4 times per year    Marital Status: Married  Human resources officer Violence: Not At Risk (09/12/2020)   Humiliation, Afraid, Rape, and Kick  questionnaire    Fear of Current or Ex-Partner: No    Emotionally Abused: No    Physically Abused: No    Sexually Abused: No   Current Outpatient Medications on File Prior to Visit  Medication Sig Dispense Refill   ACCU-CHEK GUIDE test strip USE TO CHECK BLOOD SUGAR 6  TIMES DAILY 600 strip 3   amoxicillin-clavulanate (AUGMENTIN) 875-125 MG tablet Take 1 tablet  by mouth 2 (two) times daily. 14 tablet 0   ascorbic acid (VITAMIN C) 500 MG tablet Take 1 tablet (500 mg total) by mouth daily. 20 tablet 0   aspirin EC 81 MG tablet Take 81 mg by mouth daily. Swallow whole.     atorvastatin (LIPITOR) 40 MG tablet TAKE 1 TABLET BY MOUTH  DAILY 90 tablet 3   Blood Glucose Monitoring Suppl (ACCU-CHEK GUIDE ME) w/Device KIT USE AS DIRECTED 1 kit 0   Calcium Carbonate (CALCIUM 600 PO) Take 1 tablet by mouth daily.     Cinnamon 500 MG capsule Take 3,000 mg by mouth 2 (two) times daily.     clomiPHENE (CLOMID) 50 MG tablet TAKE ONE-HALF TABLET BY  MOUTH DAILY (Patient taking differently: every other day.) 45 tablet 3   colesevelam (WELCHOL) 625 MG tablet TAKE 4 TABLETS BY MOUTH  DAILY (Patient taking differently: 1,250 mg 2 (two) times daily with a meal. 2 tablets twice a day) 360 tablet 3   Continuous Blood Gluc Sensor (DEXCOM G6 SENSOR) MISC 1 Device by Does not apply route See admin instructions. 1 every 10 days 9 each 3   cyanocobalamin (DODEX) 1000 MCG/ML injection Inject 1 mL (1,000 mcg total) into the muscle every 30 (thirty) days. 3 mL 0   docusate sodium (COLACE) 100 MG capsule Take 100 mg by mouth 2 (two) times daily. Takes daily     Glucagon 3 MG/DOSE POWD Place 3 mg into the nose once as needed for up to 1 dose. 1 each 11   Glucosamine-Chondroit-Vit C-Mn (GLUCOSAMINE CHONDR 1500 COMPLX PO) Take 2 tablets by mouth daily.     insulin lispro (HUMALOG) 100 UNIT/ML injection For use in pump, total of 50 units per day. 50 mL 3   Insulin Pen Needle 31G X 5 MM MISC 1 Device by Does not apply route in  the morning and at bedtime. 200 each 3   levothyroxine (SYNTHROID) 75 MCG tablet TAKE 1 TABLET BY MOUTH  DAILY 90 tablet 3   metFORMIN (GLUCOPHAGE-XR) 500 MG 24 hr tablet TAKE 2 TABLETS BY MOUTH TWICE  DAILY 360 tablet 1   Multiple Vitamin (MULTIVITAMIN) tablet Take 1 tablet by mouth daily.     MYRBETRIQ 50 MG TB24 tablet Take 50 mg by mouth daily.     omega-3 acid ethyl esters (LOVAZA) 1 g capsule TAKE 2 CAPSULES BY MOUTH  TWICE DAILY 360 capsule 3   pioglitazone (ACTOS) 15 MG tablet Take 1 tablet (15 mg total) by mouth daily. 90 tablet 3   Semaglutide,0.25 or 0.5MG/DOS, (OZEMPIC, 0.25 OR 0.5 MG/DOSE,) 2 MG/1.5ML SOPN Inject 0.5 mg into the skin once a week. 4.5 mL 5   SYRINGE-NEEDLE, DISP, 3 ML (LUER LOCK SAFETY SYRINGES) 25G X 1" 3 ML MISC Use monthly for B12INJ 3 each 0   tadalafil (ADCIRCA/CIALIS) 20 MG tablet Take 20 mg by mouth daily as needed for erectile dysfunction.     Trospium Chloride 60 MG CP24      Turmeric 500 MG CAPS Take 1,500 mg by mouth daily. Occ an extra dose if plays golf or tennis - usually 1,  2 x a day     VITAMIN D, CHOLECALCIFEROL, PO Take by mouth.     vitamin E 200 UNIT capsule Take 200 Units by mouth daily.     No current facility-administered medications on file prior to visit.   No Known Allergies Family History  Problem Relation Age of Onset   Hypertension Mother  Kidney disease Father    Breast cancer Neg Hx    Celiac disease Neg Hx    Cirrhosis Neg Hx    Clotting disorder Neg Hx    Colitis Neg Hx    Colon cancer Neg Hx    Colon polyps Neg Hx    Crohn's disease Neg Hx    Cystic fibrosis Neg Hx    Diabetes Neg Hx    Esophageal cancer Neg Hx    Heart disease Neg Hx    Hemochromatosis Neg Hx    Inflammatory bowel disease Neg Hx    Irritable bowel syndrome Neg Hx    Liver cancer Neg Hx    Liver disease Neg Hx    Ovarian cancer Neg Hx    Pancreatic cancer Neg Hx    Prostate cancer Neg Hx    Rectal cancer Neg Hx    Stomach cancer Neg Hx     Ulcerative colitis Neg Hx    Uterine cancer Neg Hx    Wilson's disease Neg Hx    PE: BP 120/64 (BP Location: Right Arm, Patient Position: Sitting, Cuff Size: Normal)   Pulse 81   Ht 5' 8" (1.727 m)   Wt 144 lb 6.4 oz (65.5 kg)   SpO2 98%   BMI 21.96 kg/m  Wt Readings from Last 3 Encounters:  09/11/21 144 lb 6.4 oz (65.5 kg)  07/13/21 149 lb 4 oz (67.7 kg)  06/09/21 153 lb 6.4 oz (69.6 kg)   Constitutional: normal weight, in NAD Eyes: EOMI, no exophthalmos ENT: moist mucous membranes, no thyromegaly, no cervical lymphadenopathy Cardiovascular: RRR, No MRG Respiratory: CTA B Musculoskeletal: no deformities Skin: moist, warm, no rashes Neurological: no tremor with outstretched hands  ASSESSMENT: 1. DM2, controlled, with complications - CAD  2. HL  PLAN:  1. Patient with longstanding, very well-controlled type 1 diabetes.  Latest HbA1c was 5.7% at last visit.  At that time, I advised him to decrease Actos dose and we did not change his pump settings.  His sugars appears to be in the target range, without hyper or hypoglycemia, with almost all values in the target range.  He had a very low basal rates, and we discussed that the basal rate may become insufficient if he is out of the hybrid closed-loop system.  However, he was on other medications: Metformin, Actos, and Victoza, which most likely helped with blood sugars control so at last visit we did not increase his basals.  At last visit I also recommended a glucagon kit.  His wife is an Therapist, sports and is following his Dexcom and calls him in case of low blood sugars.  He will doing an excellent job entering carbs and starting the boluses 15 minutes before his meals.   - He is an avid Firefighter.  His sugars were decreasing slightly during tennis but not enough to stop the pump or change to exercise mode.  He tried this before but did not like the higher target.  He also plays golf. CGM interpretation: -At today's visit, we reviewed his  CGM downloads: It appears that 97% of values are in target range (goal >70%), while 1% percent are higher than 180 (goal <25%), and 2% are lower than 70 (goal <4%).  The calculated average blood sugar is 111.  The projected HbA1c for the next 3 months (GMI) is 6.0%. -Reviewing the CGM trends, sugars are excellent throughout the day.  He had some higher blood sugars after dinner and he mentions that  this happened in the last week as he was out of town.  Otherwise, this is exemplary control, and I did not suggest a change in regimen.  He did have his settings adjusted since last visit, using a slightly higher rate between 5 AM and 7 AM, but he stays 100% in the auto mode, so not relying on the basal rates -Today's visit, I did advise him to try to stop Actos completely.  Since last visit, he lost 9 pounds, probably due to reducing Actos dose. I advised him to: Patient Instructions  Please use the following pump settings: - basal rates: 12 am: 0.3 units/h 5 am: 0.35 7 am: 0.3 - ICR: 1:7 - target: 110 - ISF: 1:40 - Active insulin time: 4h  Please continue: - Metformin ER 2000 mg daily - Victoza 1.8 mg in a.m.  Please stop Actos.  Please return in 4-6 months.  - we checked his HbA1c: 5.7% open stable, excellent - advised to check sugars at different times of the day - 4x a day, rotating check times - advised for yearly eye exams >> he is UTD - we did not have time to check his foot exam at this visit - return to clinic in 4-6 months - he prefers to come every 3 months to find out his HbA1c >> I advised him that he can come to have this done and then I can see him back in 6 months -he agrees with this plan  2. HL -Reviewed latest lipid panel: Fractions at goal: Lab Results  Component Value Date   CHOL 131 03/13/2021   HDL 85.10 03/13/2021   LDLCALC 35 03/13/2021   TRIG 50.0 03/13/2021   CHOLHDL 2 03/13/2021  -He continues on Lipitor 40 mg daily, WelChol 1300 mg twice a day, omega-3  fatty acids 2000 mg twice a day without side effects  3.  Hypothyroidism - latest thyroid labs reviewed with pt. >> normal: Lab Results  Component Value Date   TSH 5.04 03/13/2021  - he continues on LT4 75 mcg daily - pt feels good on this dose. - we discussed about taking the thyroid hormone every day, with water, >30 minutes before breakfast, separated by >4 hours from acid reflux medications, calcium, iron, multivitamins. Pt. is taking it correctly. - will check thyroid tests at next visit  Philemon Kingdom, MD PhD [facility] Endocrinology

## 2021-09-11 NOTE — Patient Instructions (Addendum)
Please use the following pump settings: - basal rates: 12 am: 0.3 units/h 5 am: 0.35 7 am: 0.3 - ICR: 1:7 - target: 110 - ISF: 1:40 - Active insulin time: 4h  Please continue: - Metformin ER 2000 mg daily - Victoza 1.8 mg in a.m.  Please stop Actos.  Please return in 4-6 months.

## 2021-09-14 ENCOUNTER — Telehealth: Payer: Self-pay | Admitting: Family Medicine

## 2021-09-14 NOTE — Telephone Encounter (Signed)
Spoke with patient to r/s his appt on Labor Day he req a CB in 09/2021

## 2021-09-21 ENCOUNTER — Encounter: Payer: Self-pay | Admitting: Internal Medicine

## 2021-09-21 DIAGNOSIS — E039 Hypothyroidism, unspecified: Secondary | ICD-10-CM

## 2021-09-21 MED ORDER — LEVOTHYROXINE SODIUM 75 MCG PO TABS: 75.0000 ug | ORAL_TABLET | Freq: Every day | ORAL | 3 refills | Status: DC

## 2021-09-21 NOTE — Telephone Encounter (Signed)
Pt called to advise he has d/c Actos and sugars have been really high.

## 2021-09-25 ENCOUNTER — Ambulatory Visit: Payer: Medicare Other

## 2021-10-06 ENCOUNTER — Ambulatory Visit: Payer: Medicare Other

## 2021-10-09 ENCOUNTER — Encounter: Payer: Self-pay | Admitting: Family Medicine

## 2021-10-09 ENCOUNTER — Ambulatory Visit (INDEPENDENT_AMBULATORY_CARE_PROVIDER_SITE_OTHER): Payer: Medicare Other

## 2021-10-09 VITALS — BP 122/70 | HR 82 | Temp 97.7°F | Wt 146.4 lb

## 2021-10-09 DIAGNOSIS — Z0000 Encounter for general adult medical examination without abnormal findings: Secondary | ICD-10-CM | POA: Diagnosis not present

## 2021-10-09 DIAGNOSIS — Z23 Encounter for immunization: Secondary | ICD-10-CM

## 2021-10-09 NOTE — Progress Notes (Cosign Needed Addendum)
Subjective:   David Maynard is a 71 y.o. male who presents for Medicare Annual/Subsequent preventive examination.  Review of Systems     Cardiac Risk Factors include: advanced age (>34men, >20 women);dyslipidemia;male gender;diabetes mellitus     Objective:    Today's Vitals   10/09/21 1453  BP: 122/70  Pulse: 82  Temp: 97.7 F (36.5 C)  SpO2: 97%  Weight: 146 lb 6.4 oz (66.4 kg)   Body mass index is 22.26 kg/m.     10/09/2021    3:02 PM 09/12/2020   11:20 AM 09/28/2019    6:00 PM 09/26/2019    9:40 PM 09/03/2019   10:58 AM 09/02/2018   11:17 AM 12/24/2017    8:38 AM  Advanced Directives  Does Patient Have a Medical Advance Directive? Yes Yes No No Yes Yes Yes  Type of Paramedic of [place];Living will Healthcare Power of [place];Living will  Does patient want to make changes to medical advance directive? No - Patient declined     No - Patient declined No - Patient declined  Copy of [place] in Chart? Yes - validated most recent copy scanned in chart (See row information) Yes - validated most recent copy scanned in chart (See row information)   Yes - validated most recent copy scanned in chart (See row information) Yes - validated most recent copy scanned in chart (See row information)   Would patient like information on creating a medical advance directive?    No - Patient declined       Current Medications (verified) Outpatient Encounter Medications as of 10/09/2021  Medication Sig   ACCU-CHEK GUIDE test strip USE TO CHECK BLOOD SUGAR 6  TIMES DAILY   ascorbic acid (VITAMIN C) 500 MG tablet Take 1 tablet (500 mg total) by mouth daily.   aspirin EC 81 MG tablet Take 81 mg by mouth daily. Swallow whole.   atorvastatin (LIPITOR) 40 MG tablet TAKE 1 TABLET BY MOUTH  DAILY   Blood Glucose Monitoring Suppl (ACCU-CHEK GUIDE ME) w/Device  KIT USE AS DIRECTED   Calcium Carbonate (CALCIUM 600 PO) Take 1 tablet by mouth daily.   Cinnamon 500 MG capsule Take 3,000 mg by mouth 2 (two) times daily.   clomiPHENE (CLOMID) 50 MG tablet TAKE ONE-HALF TABLET BY  MOUTH DAILY (Patient taking differently: every other day.)   colesevelam (WELCHOL) 625 MG tablet TAKE 4 TABLETS BY MOUTH  DAILY (Patient taking differently: 1,250 mg 2 (two) times daily with a meal. 2 tablets twice a day)   Continuous Blood Gluc Sensor (DEXCOM G6 SENSOR) MISC 1 Device by Does not apply route See admin instructions. 1 every 10 days   cyanocobalamin (DODEX) 1000 MCG/ML injection Inject 1 mL (1,000 mcg total) into the muscle every 30 (thirty) days.   docusate sodium (COLACE) 100 MG capsule Take 100 mg by mouth 2 (two) times daily. Takes daily   Glucagon 3 MG/DOSE POWD Place 3 mg into the nose once as needed for up to 1 dose.   Glucosamine-Chondroit-Vit C-Mn (GLUCOSAMINE CHONDR 1500 COMPLX PO) Take 2 tablets by mouth daily.   insulin lispro (HUMALOG) 100 UNIT/ML injection For use in pump, total of 50 units per day.   Insulin Pen Needle 31G X 5 MM MISC 1 Device by Does not apply route in the morning and at bedtime.   levothyroxine (SYNTHROID) 75 MCG tablet Take  1 tablet (75 mcg total) by mouth daily.   metFORMIN (GLUCOPHAGE-XR) 500 MG 24 hr tablet TAKE 2 TABLETS BY MOUTH TWICE  DAILY   Multiple Vitamin (MULTIVITAMIN) tablet Take 1 tablet by mouth daily.   MYRBETRIQ 50 MG TB24 tablet Take 50 mg by mouth daily.   omega-3 acid ethyl esters (LOVAZA) 1 g capsule TAKE 2 CAPSULES BY MOUTH  TWICE DAILY   Semaglutide,0.25 or 0.5MG /DOS, (OZEMPIC, 0.25 OR 0.5 MG/DOSE,) 2 MG/1.5ML SOPN Inject 0.5 mg into the skin once a week.   SYRINGE-NEEDLE, DISP, 3 ML (LUER LOCK SAFETY SYRINGES) 25G X 1" 3 ML MISC Use monthly for B12INJ   tadalafil (ADCIRCA/CIALIS) 20 MG tablet Take 20 mg by mouth daily as needed for erectile dysfunction.   Trospium Chloride 60 MG CP24    Turmeric 500 MG CAPS  Take 1,500 mg by mouth daily. Occ an extra dose if plays golf or tennis - usually 1,  2 x a day   VITAMIN D, CHOLECALCIFEROL, PO Take by mouth.   vitamin E 200 UNIT capsule Take 200 Units by mouth daily.   amoxicillin-clavulanate (AUGMENTIN) 875-125 MG tablet Take 1 tablet by mouth 2 (two) times daily. (Patient not taking: Reported on 10/09/2021)   No facility-administered encounter medications on file as of 10/09/2021.    Allergies (verified) Patient has no known allergies.   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   and left   Thyroid disease    Unspecified hypothyroidism    Past Surgical History:  Procedure Laterality Date   COLONOSCOPY     COLONOSCOPY WITH PROPOFOL  02/28/2021   ESOPHAGOGASTRODUODENOSCOPY  06/16/2003   INGUINAL HERNIA REPAIR     left   JOINT REPLACEMENT     KNEE ARTHROSCOPY     Left   KNEE ARTHROSCOPY W/ ACL RECONSTRUCTION     left   ROTATOR CUFF REPAIR     Right x2   Stress Cardiolite  07/15/2000   TOTAL KNEE ARTHROPLASTY Right 11/04/2017   TOTAL KNEE ARTHROPLASTY Right 11/04/2017   Procedure: TOTAL KNEE ARTHROPLASTY;  Surgeon: Salvatore Marvel, MD;  Location: [facility] OR;  Service: Orthopedics;  Laterality: Right;   TOTAL KNEE ARTHROPLASTY Left 12/23/2017   TOTAL KNEE ARTHROPLASTY Left 12/23/2017   Procedure: TOTAL KNEE ARTHROPLASTY;  Surgeon: Salvatore Marvel, MD;  Location[facility] OR;  Service: Orthopedics;  Laterality: Left;   VASECTOMY  05/2002   Family  History  Problem Relation Age of Onset   Hypertension Mother    Kidney disease Father    Breast cancer Neg Hx    Celiac disease Neg Hx    Cirrhosis Neg Hx    Clotting disorder Neg Hx    Colitis Neg Hx    Colon cancer Neg Hx    Colon polyps Neg Hx    Crohn's disease Neg Hx    Cystic fibrosis Neg Hx    Diabetes Neg Hx  Esophageal cancer Neg Hx    Heart disease Neg Hx    Hemochromatosis Neg Hx    Inflammatory bowel disease Neg Hx    Irritable bowel syndrome Neg Hx    Liver cancer Neg Hx    Liver disease Neg Hx    Ovarian cancer Neg Hx    Pancreatic cancer Neg Hx    Prostate cancer Neg Hx    Rectal cancer Neg Hx    Stomach cancer Neg Hx    Ulcerative colitis Neg Hx    Uterine cancer Neg Hx    Wilson's disease Neg Hx    Social History   Socioeconomic History   Marital status: Married    Spouse name: Not on file   Number of children: 2   Years of education: Not on file   Highest education level: Not on file  Occupational History   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  Tobacco Use   Smoking status: Never   Smokeless tobacco: Never  Vaping Use   Vaping Use: Never used  Substance and Sexual Activity   Alcohol use: Yes    Alcohol/week: 1.0 standard drink of alcohol    Types: 1 Cans of beer per week    Comment: weekly   Drug use: No   Sexual activity: Not on file  Other Topics Concern   Not on file  Social History Narrative   Not on file   Social Determinants of Health   Financial Resource Strain: Low Risk  (10/09/2021)   Overall Financial Resource Strain (CARDIA)    Difficulty of Paying Living Expenses: Not hard at all  Food Insecurity: No Food Insecurity (10/09/2021)   Hunger Vital Sign    Worried About Running Out of Food in the Last Year: Never true    Ran Out of Food in the Last Year: Never true  Transportation Needs: No Transportation Needs (10/09/2021)   PRAPARE - Hydrologist (Medical): No    Lack of  Transportation (Non-Medical): No  Physical Activity: Sufficiently Active (10/09/2021)   Exercise Vital Sign    Days of Exercise per Week: 5 days    Minutes of Exercise per Session: 120 min  Stress: No Stress Concern Present (10/09/2021)   [place]    Feeling of Stress : Not at all  Social Connections: [place] (10/09/2021)   Social Connection and Isolation Panel [NHANES]    Frequency of Communication with Friends and Family: More than three times a week    Frequency of Social Gatherings with Friends and Family: More than three times a week    Attends Religious Services: More than 4 times per year    Active Member of [employer] or Organizations: Yes    Attends Archivist Meetings: 1 to 4 times per year    Marital Status: Married    Tobacco Counseling Counseling given: Not Answered   Clinical Intake:  Pre-visit preparation completed: Yes  Pain : No/denies pain     BMI - recorded: 22.26 Nutritional Status: BMI of 19-24  Normal Nutritional Risks: None Diabetes: Yes CBG done?: Yes (100 per pt) CBG resulted in Enter/ Edit results?: No Did pt. bring in CBG monitor from home?: No  How often do you need to have someone help you when you read instructions, pamphlets, or other written materials from your doctor or pharmacy?: 1 - Never  Diabetic?Nutrition Risk Assessment:  Has the patient had any  N/V/D within the last 2 months?  No  Does the patient have any non-healing wounds?  No  Has the patient had any unintentional weight loss or weight gain?  No   Diabetes:  Is the patient diabetic?  Yes  If diabetic, was a CBG obtained today?  Yes  Did the patient bring in their glucometer from home?  No  How often do you monitor your CBG's? Daily .   Financial Strains and Diabetes Management:  Are you having any financial strains with the device, your supplies or your medication? No .  Does the patient  want to be seen by Chronic Care Management for management of their diabetes?  No  Would the patient like to be referred to a Nutritionist or for Diabetic Management?  No   Diabetic Exams:  Diabetic Eye Exam: Overdue for diabetic eye exam. Pt has been advised about the importance in completing this exam. Patient advised to call and schedule an eye exam. Diabetic Foot Exam: Overdue, Pt has been advised about the importance in completing this exam. Pt is scheduled for diabetic foot exam on next appt .   Interpreter Needed?: No  Information entered by :: Charlott Rakes, LPN   Activities of Daily Living    10/09/2021    3:03 PM  In your present state of health, do you have any difficulty performing the following activities:  Hearing? 0  Vision? 0  Difficulty concentrating or making decisions? 0  Walking or climbing stairs? 0  Dressing or bathing? 0  Doing errands, shopping? 0  Preparing Food and eating ? N  Using the Toilet? N  In the past six months, have you accidently leaked urine? Y  Comment on medication  Do you have problems with loss of bowel control? N  Managing your Medications? N  Managing your Finances? N  Housekeeping or managing your Housekeeping? N    Patient Care Team: Vivi Barrack, MD as PCP - General (Family Medicine) Kristeen Miss, MD as Attending Physician (Neurosurgery) Renato Shin, MD (Inactive) as Consulting Physician (Endocrinology) Elsie Saas, MD as Consulting Physician (Orthopedic Surgery) Midge Minium, DMD as Consulting Physician (Dentistry) [facility] [place] as Consulting Physician (Orthopedic Surgery) Karen Kays, NP as Nurse Practitioner (Nurse Practitioner) Calvert Cantor, MD as Consulting Physician (Ophthalmology)  Indicate any recent [place] you may have received from other than [facility] providers in the past year (date may be approximate).     Assessment:   This is a routine wellness examination for  [place].  Hearing/Vision screen Hearing Screening - Comments:: Wears a hearing aid  Vision Screening - Comments:: Pt follows up with Dr Sandre Kitty with dr [facility] office   Dietary issues and exercise activities discussed: Current Exercise Habits: Home exercise routine, Type of exercise: walking;Other - see comments (golf and tennis), Time (Minutes): > 60, Frequency (Times/Week): 5, Weekly Exercise (Minutes/Week): 0   Goals Addressed             This Visit's Progress    Patient Stated       Keep A1C 5.7 or lower       Depression Screen    10/09/2021    3:00 PM 09/12/2020   11:20 AM 09/03/2019   11:09 AM 09/02/2018   11:17 AM 01/08/2018   10:02 AM 01/24/2016   11:37 AM  PHQ 2/9 Scores  PHQ - 2 Score 0 0 0 0 0 0    Fall Risk    10/09/2021  3:02 PM 09/12/2020   11:22 AM 09/03/2019   11:13 AM 01/13/2019    9:33 AM 09/02/2018   11:17 AM  Fall Risk   Falls in the past year? 0 0 0 0 1  Number falls in past yr: 0 0 0  0  Injury with Fall? 0 0 0  1  Risk for fall due to : No Fall Risks    Impaired balance/gait  Follow up Falls prevention discussed Falls prevention discussed Falls prevention discussed  Education provided    FALL RISK PREVENTION PERTAINING TO THE HOME:  Any stairs in or around the home? Yes  If so, are there any without handrails? No  Home free of loose throw rugs in walkways, pet beds, electrical cords, etc? Yes  Adequate lighting in your home to reduce risk of falls? Yes   ASSISTIVE DEVICES UTILIZED TO PREVENT FALLS:  Life alert? No  Use of a cane, walker or w/c? No  Grab bars in the bathroom? No  Shower chair or bench in shower? No  Elevated toilet seat or a handicapped toilet? No   TIMED UP AND GO:  Was the test performed? Yes .  Length of time to ambulate 10 feet: 10 sec.   Gait steady and fast without use of assistive device  Cognitive Function:        10/09/2021    3:04 PM 09/12/2020   11:32 AM 09/03/2019   11:16 AM 09/02/2018   11:20 AM   6CIT Screen  What Year? 0 points 0 points 0 points 0 points  What month? 0 points 0 points 0 points 0 points  What time? 0 points 0 points  0 points  Count back from 20 0 points 0 points 0 points 0 points  Months in reverse 0 points 0 points 0 points 0 points  Repeat phrase 0 points 0 points 0 points 0 points  Total Score 0 points 0 points  0 points    Immunizations Immunization History  Administered Date(s) Administered   Fluad Quad(high Dose 65+) 12/08/2018, 10/26/2019, 11/21/2020   Influenza Split 12/10/2011   Influenza Whole 11/22/2009   Influenza, High Dose Seasonal PF 10/13/2015, 12/03/2016, 10/22/2017   Influenza-Unspecified 11/10/2013   Moderna Sars-Covid-2 Vaccination 01/14/2019, 02/12/2019   PFIZER(Purple Top)SARS-COV-2 Vaccination 12/28/2019   Pneumococcal Conjugate-13 11/28/2015   Pneumococcal Polysaccharide-23 03/29/2009, 12/03/2016   Tdap 11/09/2013   Zoster Recombinat (Shingrix) 05/28/2016, 09/03/2016   Zoster, Live 01/22/2010    TDAP status: Up to date  Flu Vaccine status: Up to date  Pneumococcal vaccine status: Up to date  Covid-19 vaccine status: Completed vaccines  Qualifies for Shingles Vaccine? Yes   Zostavax completed Yes   Shingrix Completed?: Yes  Screening Tests Health Maintenance  Topic Date Due   COVID-19 Vaccine (4 - Mixed Product risk series) 02/22/2020   OPHTHALMOLOGY EXAM  12/28/2020   FOOT EXAM  08/15/2021   INFLUENZA VACCINE  08/22/2021   Diabetic kidney evaluation - GFR measurement  03/13/2022   Diabetic kidney evaluation - Urine ACR  03/13/2022   HEMOGLOBIN A1C  03/14/2022   TETANUS/TDAP  11/10/2023   COLONOSCOPY (Pts 45-42yrs Insurance coverage will need to be confirmed)  03/01/2031   Pneumonia Vaccine 14+ Years old  Completed   Hepatitis C Screening  Completed   Zoster Vaccines- Shingrix  Completed   HPV VACCINES  Aged Out    Health Maintenance  Health Maintenance Due  Topic Date Due   COVID-19 Vaccine (4 - Mixed  Product risk series) 02/22/2020  OPHTHALMOLOGY EXAM  12/28/2020   FOOT EXAM  08/15/2021   INFLUENZA VACCINE  08/22/2021    Colorectal cancer screening: Type of screening: Colonoscopy. Completed 02/28/21. Repeat every 10 years   Additional Screening:  Hepatitis C Screening:  Completed 11/28/15  Vision Screening: Recommended annual ophthalmology exams for early detection of glaucoma and other disorders of the eye. Is the patient up to date with their annual eye exam?  No  Who is the provider or what is the name of the office in which the patient attends annual eye exams? Dr Sandre Kitty  If pt is not established with a provider, would they like to be referred to a provider to establish care? No .   Dental Screening: Recommended annual dental exams for proper oral hygiene  Community Resource Referral / Chronic Care Management: CRR required this visit?  No   CCM required this visit?  No      Plan:     I have personally reviewed and noted the following in the patient's chart:   Medical and social history Use of alcohol, tobacco or illicit drugs  Current medications and supplements including opioid prescriptions. Patient is not currently taking opioid prescriptions. Functional ability and status Nutritional status Physical activity Advanced directives List of other physicians Hospitalizations, surgeries, and ER visits in previous 12 months Vitals Screenings to include cognitive, depression, and falls Referrals and appointments  In addition, I have reviewed and discussed with patient certain preventive protocols, quality metrics, and best practice recommendations. A written personalized care plan for preventive services as well as general preventive health recommendations were provided to patient.     Willette Brace, LPN   [id number]   Nurse Notes: none

## 2021-10-09 NOTE — Patient Instructions (Addendum)
David Maynard , Thank you for taking time to come for your Medicare Wellness Visit. I appreciate your ongoing commitment to your health goals. Please review the following plan we discussed and let me know if I can assist you in the future.   Screening recommendations/referrals: Colonoscopy: done 02/28/21 repeat every 10 years  Recommended yearly ophthalmology/optometry visit for glaucoma screening and checkup Recommended yearly dental visit for hygiene and checkup  Vaccinations: Influenza vaccine: done 10/09/21 repeat every year  Pneumococcal vaccine: Up to date Tdap vaccine: done 11/09/13 repeat every 10 years  Shingles vaccine: completed 5/7, 09/03/16    Covid-19: completed 01/14/19, 1/21, 12/28/19  Advanced directives: copies in chart   Conditions/risks identified: keep A1C below 5/.7  Next appointment: Follow up in one year for your annual wellness visit.   Preventive Care 26 Years and Older, Male Preventive care refers to lifestyle choices and visits with your health care provider that can promote health and wellness. What does preventive care include? A yearly physical exam. This is also called an annual well check. Dental exams once or twice a year. Routine eye exams. Ask your health care provider how often you should have your eyes checked. Personal lifestyle choices, including: Daily care of your teeth and gums. Regular physical activity. Eating a healthy diet. Avoiding tobacco and drug use. Limiting alcohol use. Practicing safe sex. Taking low doses of aspirin every day. Taking vitamin and mineral supplements as recommended by your health care provider. What happens during an annual well check? The services and screenings done by your health care provider during your annual well check will depend on your age, overall health, lifestyle risk factors, and family history of disease. Counseling  Your health care provider may ask you questions about your: Alcohol use. Tobacco  use. Drug use. Emotional well-being. Home and relationship well-being. Sexual activity. Eating habits. History of falls. Memory and ability to understand (cognition). Work and work Statistician. Screening  You may have the following tests or measurements: Height, weight, and BMI. Blood pressure. Lipid and cholesterol levels. These may be checked every 5 years, or more frequently if you are over 22 years old. Skin check. Lung cancer screening. You may have this screening every year starting at age 60 if you have a 30-pack-year history of smoking and currently smoke or have quit within the past 15 years. Fecal occult blood test (FOBT) of the stool. You may have this test every year starting at age 50. Flexible sigmoidoscopy or colonoscopy. You may have a sigmoidoscopy every 5 years or a colonoscopy every 10 years starting at age 70. Prostate cancer screening. Recommendations will vary depending on your family history and other risks. Hepatitis C blood test. Hepatitis B blood test. Sexually transmitted disease (STD) testing. Diabetes screening. This is done by checking your blood sugar (glucose) after you have not eaten for a while (fasting). You may have this done every 1-3 years. Abdominal aortic aneurysm (AAA) screening. You may need this if you are a current or former smoker. Osteoporosis. You may be screened starting at age 34 if you are at high risk. Talk with your health care provider about your test results, treatment options, and if necessary, the need for more tests. Vaccines  Your health care provider may recommend certain vaccines, such as: Influenza vaccine. This is recommended every year. Tetanus, diphtheria, and acellular pertussis (Tdap, Td) vaccine. You may need a Td booster every 10 years. Zoster vaccine. You may need this after age 22. Pneumococcal 13-valent conjugate (PCV13)  vaccine. One dose is recommended after age 57. Pneumococcal polysaccharide (PPSV23) vaccine.  One dose is recommended after age 37. Talk to your health care provider about which screenings and vaccines you need and how often you need them. This information is not intended to replace advice given to you by your health care provider. Make sure you discuss any questions you have with your health care provider. Document Released: 02/04/2015 Document Revised: 09/28/2015 Document Reviewed: 11/09/2014 Elsevier Interactive Patient Education  2017 [place] Prevention in the Home Falls can cause injuries. They can happen to people of all ages. There are many things you can do to make your home safe and to help prevent falls. What can I do on the outside of my home? Regularly fix the edges of walkways and driveways and fix any cracks. Remove anything that might make you trip as you walk through a door, such as a raised step or threshold. Trim any bushes or trees on the path to your home. Use bright outdoor lighting. Clear any walking paths of anything that might make someone trip, such as rocks or tools. Regularly check to see if handrails are loose or broken. Make sure that both sides of any steps have handrails. Any raised decks and porches should have guardrails on the edges. Have any leaves, snow, or ice cleared regularly. Use sand or salt on walking paths during winter. Clean up any spills in your garage right away. This includes oil or grease spills. What can I do in the bathroom? Use night lights. Install grab bars by the toilet and in the tub and shower. Do not use towel bars as grab bars. Use non-skid mats or decals in the tub or shower. If you need to sit down in the shower, use a plastic, non-slip stool. Keep the floor dry. Clean up any water that spills on the floor as soon as it happens. Remove soap buildup in the tub or shower regularly. Attach bath mats securely with double-sided non-slip rug tape. Do not have throw rugs and other things on the floor that can make  you trip. What can I do in the bedroom? Use night lights. Make sure that you have a light by your bed that is easy to reach. Do not use any sheets or blankets that are too big for your bed. They should not hang down onto the floor. Have a firm chair that has side arms. You can use this for support while you get dressed. Do not have throw rugs and other things on the floor that can make you trip. What can I do in the kitchen? Clean up any spills right away. Avoid walking on wet floors. Keep items that you use a lot in easy-to-reach places. If you need to reach something above you, use a strong step stool that has a grab bar. Keep electrical cords out of the way. Do not use floor polish or wax that makes floors slippery. If you must use wax, use non-skid floor wax. Do not have throw rugs and other things on the floor that can make you trip. What can I do with my stairs? Do not leave any items on the stairs. Make sure that there are handrails on both sides of the stairs and use them. Fix handrails that are broken or loose. Make sure that handrails are as long as the stairways. Check any carpeting to make sure that it is firmly attached to the stairs. Fix any carpet that is loose  or worn. Avoid having throw rugs at the top or bottom of the stairs. If you do have throw rugs, attach them to the floor with carpet tape. Make sure that you have a light switch at the top of the stairs and the bottom of the stairs. If you do not have them, ask someone to add them for you. What else can I do to help prevent falls? Wear shoes that: Do not have high heels. Have rubber bottoms. Are comfortable and fit you well. Are closed at the toe. Do not wear sandals. If you use a stepladder: Make sure that it is fully opened. Do not climb a closed stepladder. Make sure that both sides of the stepladder are locked into place. Ask someone to hold it for you, if possible. Clearly mark and make sure that you can  see: Any grab bars or handrails. First and last steps. Where the edge of each step is. Use tools that help you move around (mobility aids) if they are needed. These include: Canes. Walkers. Scooters. Crutches. Turn on the lights when you go into a dark area. Replace any light bulbs as soon as they burn out. Set up your furniture so you have a clear path. Avoid moving your furniture around. If any of your floors are uneven, fix them. If there are any pets around you, be aware of where they are. Review your medicines with your doctor. Some medicines can make you feel dizzy. This can increase your chance of falling. Ask your doctor what other things that you can do to help prevent falls. This information is not intended to replace advice given to you by your health care provider. Make sure you discuss any questions you have with your health care provider. Document Released: 11/04/2008 Document Revised: 06/16/2015 Document Reviewed: 02/12/2014 Elsevier Interactive Patient Education  2017 [employer].

## 2021-10-11 NOTE — Telephone Encounter (Signed)
Yes it is ok to take the oral supplements but the injections will help bring his levels back to normal faster. We should recheck his B12 levels in 3-6 months.  David Maynard. Jerline Pain, MD 10/11/2021 7:30 AM

## 2021-10-12 NOTE — Telephone Encounter (Signed)
Yes it is ok to take both.  Algis Greenhouse. Jerline Pain, MD 10/12/2021 11:19 AM

## 2021-10-27 ENCOUNTER — Encounter: Payer: Self-pay | Admitting: Internal Medicine

## 2021-10-27 ENCOUNTER — Other Ambulatory Visit: Payer: Self-pay

## 2021-10-27 DIAGNOSIS — E039 Hypothyroidism, unspecified: Secondary | ICD-10-CM

## 2021-10-27 MED ORDER — COLESEVELAM HCL 625 MG PO TABS: 2500.0000 mg | ORAL_TABLET | Freq: Every day | ORAL | 3 refills | Status: DC

## 2021-11-03 ENCOUNTER — Other Ambulatory Visit: Payer: Self-pay | Admitting: Family Medicine

## 2021-11-03 ENCOUNTER — Encounter: Payer: Self-pay | Admitting: Internal Medicine

## 2021-11-06 ENCOUNTER — Encounter: Payer: Self-pay | Admitting: Internal Medicine

## 2021-11-06 DIAGNOSIS — E119 Type 2 diabetes mellitus without complications: Secondary | ICD-10-CM

## 2021-11-07 MED ORDER — ACCU-CHEK GUIDE ME W/DEVICE KIT: PACK | 0 refills | Status: DC

## 2021-11-14 ENCOUNTER — Encounter: Payer: Self-pay | Admitting: Internal Medicine

## 2021-11-24 ENCOUNTER — Other Ambulatory Visit: Payer: Self-pay

## 2021-11-24 DIAGNOSIS — E119 Type 2 diabetes mellitus without complications: Secondary | ICD-10-CM

## 2021-11-24 MED ORDER — INSULIN LISPRO 100 UNIT/ML IJ SOLN: INTRAMUSCULAR | 3 refills | Status: DC

## 2021-11-27 LAB — HM DIABETES EYE EXAM

## 2021-11-28 ENCOUNTER — Other Ambulatory Visit: Payer: Self-pay | Admitting: *Deleted

## 2021-12-13 ENCOUNTER — Other Ambulatory Visit: Payer: Self-pay | Admitting: Internal Medicine

## 2021-12-14 ENCOUNTER — Encounter: Payer: Self-pay | Admitting: Internal Medicine

## 2021-12-21 ENCOUNTER — Encounter: Payer: Self-pay | Admitting: Family Medicine

## 2021-12-22 NOTE — Telephone Encounter (Signed)
Patient informed needs OV for evaluation and RX.  Patient requests to be contacted asap-[place] re: Patient wants to know if this is Dr. Lavone Neri ruling or [place]'s.

## 2021-12-22 NOTE — Telephone Encounter (Signed)
Patient need OV for evaluation and prescription

## 2021-12-22 NOTE — Telephone Encounter (Signed)
Patient states: - He needs a antibiotic sent in for him since he is set to go on his cruise on Sunday.   I informed patient that a quicker option for his situation would be to go to the UC since PCP office and sister offices are full but he declined. States this is not an option. States he has been sent an antibiotic without a visit before. Please advise.

## 2022-01-01 ENCOUNTER — Encounter: Payer: Self-pay | Admitting: Internal Medicine

## 2022-01-02 ENCOUNTER — Ambulatory Visit (INDEPENDENT_AMBULATORY_CARE_PROVIDER_SITE_OTHER): Payer: Medicare Other

## 2022-01-02 DIAGNOSIS — E119 Type 2 diabetes mellitus without complications: Secondary | ICD-10-CM

## 2022-01-02 LAB — POCT GLYCOSYLATED HEMOGLOBIN (HGB A1C): Hemoglobin A1C: 6 % — AB (ref 4.0–5.6)

## 2022-01-02 NOTE — Telephone Encounter (Signed)
Patient is scheduled for nurse visit for A1C today 01/02/2022.

## 2022-01-02 NOTE — Progress Notes (Signed)
Patient seen today for A1C check. Results have been entered and patient informed of next follow up date and time.

## 2022-01-09 ENCOUNTER — Other Ambulatory Visit: Payer: Self-pay | Admitting: Urology

## 2022-01-24 ENCOUNTER — Telehealth: Payer: Self-pay | Admitting: *Deleted

## 2022-01-24 NOTE — Telephone Encounter (Signed)
Please schedule surgical clearance appt Thanks

## 2022-01-25 ENCOUNTER — Other Ambulatory Visit: Payer: Self-pay | Admitting: *Deleted

## 2022-01-25 MED ORDER — CYANOCOBALAMIN 1000 MCG/ML IJ SOLN: 1000.0000 ug | INTRAMUSCULAR | 0 refills | Status: DC

## 2022-01-29 ENCOUNTER — Ambulatory Visit (INDEPENDENT_AMBULATORY_CARE_PROVIDER_SITE_OTHER): Payer: Medicare Other | Admitting: Family Medicine

## 2022-01-29 ENCOUNTER — Encounter: Payer: Self-pay | Admitting: Family Medicine

## 2022-01-29 VITALS — BP 127/76 | HR 71 | Temp 97.7°F | Ht 68.0 in | Wt 143.0 lb

## 2022-01-29 DIAGNOSIS — E119 Type 2 diabetes mellitus without complications: Secondary | ICD-10-CM | POA: Diagnosis not present

## 2022-01-29 DIAGNOSIS — N529 Male erectile dysfunction, unspecified: Secondary | ICD-10-CM

## 2022-01-29 DIAGNOSIS — E039 Hypothyroidism, unspecified: Secondary | ICD-10-CM

## 2022-01-29 NOTE — Assessment & Plan Note (Signed)
Follows with endocrinology.  Last TSH at goal. °

## 2022-01-29 NOTE — Assessment & Plan Note (Signed)
Sugars have been low.  Continue management per endocrinology.  Is on Ozempic 0.5 mg weekly, metformin 500 mg twice daily, and insulin pump.

## 2022-01-29 NOTE — Patient Instructions (Signed)
It was very nice to see you today!  We will send a clearance form to your surgeon's office.  Is okay for you to stop your aspirin 5 days before your procedure.  We will see you back when you are due for your physical.  Take care, Dr Jerline Pain  PLEASE NOTE:  If you had any lab tests, please let us know if you have not heard back within a few days. You may see your results on mychart before we have a chance to review them but we will give you a call once they are reviewed by [place].   If we ordered any referrals today, please let us know if you have not heard from their office within the next week.   If you had any urgent prescriptions sent in today, please check with the pharmacy within an hour of our visit to make sure the prescription was transmitted appropriately.   Please try these tips to maintain a healthy lifestyle:  Eat at least 3 REAL meals and 1-2 snacks per day.  Aim for no more than 5 hours between eating.  If you eat breakfast, please do so within one hour of getting up.   Each meal should contain half fruits/vegetables, one quarter protein, and one quarter carbs (no bigger than a computer mouse)  Cut down on sweet beverages. This includes juice, soda, and sweet tea.   Drink at least 1 glass of water with each meal and aim for at least 8 glasses per day  Exercise at least 150 minutes every week.

## 2022-01-29 NOTE — Progress Notes (Signed)
David Maynard is a 72 y.o. male who presents today for an office visit.  Assessment/Plan:  New/Acute Problems: Encounter for Surgical Clearance Patient overall low risk for complication from upcoming surgery.  Yes Functional status.  It is okay for him to hold his aspirin for 5 days prior to procedure.  Will fax this note to his surgeon's office.   Chronic Problems Addressed Today: ED (erectile dysfunction) Continue management per urology.  Will have upcoming penile implant surgery.  T2DM (type 2 diabetes mellitus) (HCC) Sugars have been low.  Continue management per endocrinology.  Is on Ozempic 0.5 mg weekly, metformin 500 mg twice daily, and insulin pump.  Hypothyroidism Follows with endocrinology.  Last TSH at goal.     Subjective:  HPI:  Patient here for surgical clearance. He will be getting penile implant next month via urology.  Still has ongoing issues with erectile dysfunction.  Has tried medical management without much improvement.  He has excellent functional status.  Plays golf.  Tennis routinely.  No disproportional shortness of breath.  No chest pain.  He has been compliant with his medications.  Sugars have been at goal.  Been following with endocrinology for this.  He has been as well.  We will on him open.  He has been managing his blood sugar and blood pressure well.  ROS: Per HPI, otherwise a complete review of systems was negative.   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   and left   Thyroid disease    Unspecified hypothyroidism    Patient Active Problem List   Diagnosis Date Noted   Umbilical hernia without obstruction and without gangrene 02/23/2020   Gastroesophageal reflux disease 01/13/2019   BPH  01/08/2018   ED (erectile dysfunction) 01/08/2018   OAB (overactive bladder) 01/08/2018   History of bilateral knee replacement 12/11/2017   Lumbar disc disease 09/22/2010   T2DM (type 2 diabetes mellitus) (HCC)    Hypogonadism male 06/19/2007   Dyslipidemia associated with type 2 diabetes mellitus (HCC) 06/19/2007   Hypothyroidism 03/05/2007   Past Surgical History:  Procedure Laterality Date   COLONOSCOPY     COLONOSCOPY WITH PROPOFOL  02/28/2021   ESOPHAGOGASTRODUODENOSCOPY  06/16/2003   INGUINAL HERNIA REPAIR     left   JOINT REPLACEMENT     KNEE ARTHROSCOPY     Left   KNEE ARTHROSCOPY W/ ACL RECONSTRUCTION     left   ROTATOR CUFF REPAIR     Right x2   Stress Cardiolite  07/15/2000   TOTAL KNEE ARTHROPLASTY Right 11/04/2017   TOTAL KNEE ARTHROPLASTY Right 11/04/2017   Procedure: TOTAL KNEE ARTHROPLASTY;  Surgeon: Salvatore Marvel, MD;  Location: [facility] OR;  Service: Orthopedics;  Laterality: Right;   TOTAL KNEE ARTHROPLASTY Left 12/23/2017   TOTAL KNEE ARTHROPLASTY Left 12/23/2017   Procedure: TOTAL KNEE ARTHROPLASTY;  Surgeon: Thurston Hole,  Molly Maduro, MD;  Location: [facility] OR;  Service: Orthopedics;  Laterality: Left;   VASECTOMY  05/2002    Family History  Problem Relation Age of Onset   Hypertension Mother    Kidney disease Father    Breast cancer Neg Hx    Celiac disease Neg Hx    Cirrhosis Neg Hx    Clotting disorder Neg Hx    Colitis Neg Hx    Colon cancer Neg Hx    Colon polyps Neg Hx    Crohn's disease Neg Hx    Cystic fibrosis Neg Hx    Diabetes Neg Hx    Esophageal cancer Neg Hx    Heart disease Neg Hx    Hemochromatosis Neg Hx    Inflammatory  bowel disease Neg Hx    Irritable bowel syndrome Neg Hx    Liver cancer Neg Hx    Liver disease Neg Hx    Ovarian cancer Neg Hx    Pancreatic cancer Neg Hx    Prostate cancer Neg Hx    Rectal cancer Neg Hx    Stomach cancer Neg Hx    Ulcerative colitis Neg Hx    Uterine cancer Neg Hx    Wilson's disease Neg Hx     Medications- reviewed and updated Current Outpatient Medications  Medication Sig Dispense Refill   ACCU-CHEK GUIDE test strip USE TO CHECK BLOOD SUGAR 6  TIMES DAILY 600 strip 3   ascorbic acid (VITAMIN C) 500 MG tablet Take 1 tablet (500 mg total) by mouth daily. 20 tablet 0   aspirin EC 81 MG tablet Take 81 mg by mouth daily. Swallow whole.     atorvastatin (LIPITOR) 40 MG tablet TAKE 1 TABLET BY MOUTH  DAILY 90 tablet 3   Blood Glucose Monitoring Suppl (ACCU-CHEK GUIDE ME) w/Device KIT USE AS DIRECTED 1 kit 0   Calcium Carbonate (CALCIUM 600 PO) Take 1 tablet by mouth daily.     Cinnamon 500 MG capsule Take 3,000 mg by mouth 2 (two) times daily.     clomiPHENE (CLOMID) 50 MG tablet TAKE ONE-HALF TABLET BY  MOUTH DAILY (Patient taking differently: every other day.) 45 tablet 3   colesevelam (WELCHOL) 625 MG tablet Take 4 tablets (2,500 mg total) by mouth daily. 360 tablet 3   Continuous Blood Gluc Sensor (DEXCOM G6 SENSOR) MISC 1 Device by Does not apply route See admin instructions. 1 every 10 days 9 each 3   cyanocobalamin (DODEX) 1000 MCG/ML injection Inject 1 mL (1,000 mcg total) into the muscle every 30 (thirty) days. 3 mL 0   docusate sodium (COLACE) 100 MG capsule Take 100 mg by mouth 2 (two) times daily. Takes daily     Glucagon 3 MG/DOSE POWD Place 3 mg into the nose once as needed for up to 1 dose. 1 each 11   Glucosamine-Chondroit-Vit C-Mn (GLUCOSAMINE CHONDR 1500 COMPLX PO) Take 2 tablets by mouth daily.     Insulin Disposable Pump (OMNIPOD 5 G6 POD, GEN 5,) MISC Change pod every 72 hours. 10 each 10   insulin lispro (HUMALOG) 100 UNIT/ML injection For use in  pump, total of 50 units per day. 50 mL 3   Insulin Pen Needle 31G X 5 MM MISC 1 Device by Does not apply route in the morning and at bedtime. 200 each 3   levothyroxine (SYNTHROID) 75 MCG tablet Take 1 tablet (75 mcg total) by mouth daily. 90 tablet 3   metFORMIN (GLUCOPHAGE-XR) 500 MG 24 hr tablet TAKE  2 TABLETS BY MOUTH TWICE  DAILY 360 tablet 1   Multiple Vitamin (MULTIVITAMIN) tablet Take 1 tablet by mouth daily.     MYRBETRIQ 50 MG TB24 tablet Take 50 mg by mouth daily.     omega-3 acid ethyl esters (LOVAZA) 1 g capsule TAKE 2 CAPSULES BY MOUTH  TWICE DAILY 360 capsule 3   Semaglutide,0.25 or 0.5MG /DOS, (OZEMPIC, 0.25 OR 0.5 MG/DOSE,) 2 MG/1.5ML SOPN Inject 0.5 mg into the skin once a week. 4.5 mL 5   SYRINGE-NEEDLE, DISP, 3 ML (B-D 3CC LUER-LOK SYR 25GX1") 25G X 1" 3 ML MISC USE AS DIRECTED MONTHLY FOR B-12 INJECTIONS 3 each 0   tadalafil (ADCIRCA/CIALIS) 20 MG tablet Take 20 mg by mouth daily as needed for erectile dysfunction.     Trospium Chloride 60 MG CP24      Turmeric 500 MG CAPS Take 1,500 mg by mouth daily. Occ an extra dose if plays golf or tennis - usually 1,  2 x a day     VITAMIN D, CHOLECALCIFEROL, PO Take by mouth.     vitamin E 200 UNIT capsule Take 200 Units by mouth daily.     No current facility-administered medications for this visit.    Allergies-reviewed and updated No Known Allergies  Social History   Socioeconomic History   Marital status: Married    Spouse name: Not on file   Number of children: 2   Years of education: Not on file   Highest education level: Not on file  Occupational History   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  Tobacco Use   Smoking status: Never   Smokeless tobacco: Never  Vaping Use   Vaping Use: Never used  Substance and Sexual Activity   Alcohol use: Yes    Alcohol/week: 1.0 standard drink of alcohol    Types: 1 Cans of beer per week    Comment: weekly   Drug use: No   Sexual activity: Not on file  Other  Topics Concern   Not on file  Social History Narrative   Not on file   Social Determinants of Health   Financial Resource Strain: Low Risk  (10/09/2021)   Overall Financial Resource Strain (CARDIA)    Difficulty of Paying Living Expenses: Not hard at all  Food Insecurity: No Food Insecurity (10/09/2021)   Hunger Vital Sign    Worried About Running Out of Food in the Last Year: Never true    Ran Out of Food in the Last Year: Never true  Transportation Needs: No Transportation Needs (10/09/2021)   PRAPARE - Hydrologist (Medical): No    Lack of Transportation (Non-Medical): No  Physical Activity: Sufficiently Active (10/09/2021)   Exercise Vital Sign    Days of Exercise per Week: 5 days    Minutes of Exercise per Session: 120 min  Stress: No Stress Concern Present (10/09/2021)   [place]    Feeling of Stress : Not at all  Social Connections: [place] (10/09/2021)   Social Connection and Isolation Panel [NHANES]    Frequency of Communication with Friends and Family: More than three times a week    Frequency of Social Gatherings with Friends and Family: More than three times a week    Attends Religious Services: More than 4 times per year    Active Member of [employer] or Organizations: Yes    Attends Archivist Meetings: 1 to 4  times per year    Marital Status: Married           Objective:  Physical Exam: BP 127/76   Pulse 71   Temp 97.7 F (36.5 C) (Temporal)   Ht 5\' 8"  (1.727 m)   Wt 143 lb (64.9 kg)   SpO2 97%   BMI 21.74 kg/m   Gen: No acute distress, resting comfortably CV: Regular rate and rhythm with no murmurs appreciated Pulm: Normal work of breathing, clear to auscultation bilaterally with no crackles, wheezes, or rhonchi Neuro: Grossly normal, moves all extremities Psych: Normal affect and thought content      Moreen Piggott M. , MD 01/29/2022 2:59 PM

## 2022-01-29 NOTE — Assessment & Plan Note (Signed)
Continue management per urology.  Will have upcoming penile implant surgery.

## 2022-01-30 ENCOUNTER — Other Ambulatory Visit: Payer: Self-pay | Admitting: Family Medicine

## 2022-01-31 ENCOUNTER — Other Ambulatory Visit: Payer: Self-pay | Admitting: Family Medicine

## 2022-02-02 ENCOUNTER — Encounter: Payer: Self-pay | Admitting: Internal Medicine

## 2022-02-02 DIAGNOSIS — E119 Type 2 diabetes mellitus without complications: Secondary | ICD-10-CM

## 2022-02-05 MED ORDER — INSULIN LISPRO 100 UNIT/ML IJ SOLN: INTRAMUSCULAR | 3 refills | Status: DC

## 2022-02-15 ENCOUNTER — Other Ambulatory Visit: Payer: Self-pay

## 2022-02-15 DIAGNOSIS — E119 Type 2 diabetes mellitus without complications: Secondary | ICD-10-CM

## 2022-02-15 MED ORDER — METFORMIN HCL ER 500 MG PO TB24: 1000.0000 mg | ORAL_TABLET | Freq: Two times a day (BID) | ORAL | 1 refills | Status: DC

## 2022-02-20 NOTE — Patient Instructions (Addendum)
SURGICAL WAITING ROOM VISITATION  Patients having surgery or a procedure may have no more than 2 support people in the waiting area - these visitors may rotate.    Children under the age of 63 must have an adult with them who is not the patient.  Due to an increase in RSV and influenza rates and associated hospitalizations, children ages 34 and under may not visit patients in [place].  If the patient needs to stay at the hospital during part of their recovery, the visitor guidelines for inpatient rooms apply. Pre-op nurse will coordinate an appropriate time for 1 support person to accompany patient in pre-op.  This support person may not rotate.    Please refer to the [facility] website for the visitor guidelines for Inpatients (after your surgery is over and you are in a regular room).       Your procedure is scheduled on: 02-27-22   Report to [facility] Main Entrance    Report to admitting at       [place]  AM   Call this number if you have problems the morning of surgery [phone]   Do not eat food  or drink liquids :After Midnight.           If you have questions, please contact your surgeon's office.   FOLLOW ANY ADDITIONAL PRE OP INSTRUCTIONS YOU RECEIVED FROM YOUR SURGEON'S OFFICE!!!     Oral Hygiene is also important to reduce your risk of infection.                                    Remember - BRUSH YOUR TEETH THE MORNING OF SURGERY WITH YOUR REGULAR TOOTHPASTE  DENTURES WILL BE REMOVED PRIOR TO SURGERY PLEASE DO NOT APPLY "Poly grip" OR ADHESIVES!!!   Do NOT smoke after Midnight   Take these medicines the morning of surgery with A SIP OF WATER: Trospiumchloride, myrbetriq, levothyroxine, colosevelam, atorvastatin   DO NOT TAKE ANY ORAL DIABETIC MEDICATIONS DAY OF YOUR SURGERY  How to Manage Your Diabetes Before and After Surgery  Why is it important to control my blood sugar before and after surgery? Improving blood sugar levels before  and after surgery helps healing and can limit problems. A way of improving blood sugar control is eating a healthy diet by:  Eating less sugar and carbohydrates  Increasing activity/exercise  Talking with your doctor about reaching your blood sugar goals High blood sugars (greater than 180 mg/dL) can raise your risk of infections and slow your recovery, so you will need to focus on controlling your diabetes during the weeks before surgery. Make sure that the doctor who takes care of your diabetes knows about your planned surgery including the date and location.  How do I manage my blood sugar before surgery? Check your blood sugar at least 4 times a day, starting 2 days before surgery, to make sure that the level is not too high or low. Check your blood sugar the morning of your surgery when you wake up and every 2 hours until you get to the Short Stay unit. If your blood sugar is less than 70 mg/dL, you will need to treat for low blood sugar: Do not take insulin. Treat a low blood sugar (less than 70 mg/dL) with  cup of clear juice (cranberry or apple), 4 glucose tablets, OR glucose gel. Recheck blood sugar in 15 minutes after  treatment (to make sure it is greater than 70 mg/dL). If your blood sugar is not greater than 70 mg/dL on recheck, call [phone] for further instructions. Report your blood sugar to the short stay nurse when you get to Short Stay.  If you are admitted to the hospital after surgery: Your blood sugar will be checked by the staff and you will probably be given insulin after surgery (instead of oral diabetes medicines) to make sure you have good blood sugar levels. The goal for blood sugar control after surgery is 80-180 mg/dL.   WHAT DO I DO ABOUT MY DIABETES MEDICATION?  Do not take oral diabetes medicines (pills) the morning of surgery.    DO NOT TAKE THE FOLLOWING 7 DAYS PRIOR TO SURGERY: Ozempic, Wegovy, Rybelsus (Semaglutide), Byetta (exenatide), Bydureon  (exenatide ER), Victoza, Saxenda (liraglutide), or Trulicity (dulaglutide) Mounjaro (Tirzepatide) Adlyxin (Lixisenatide), Polyethylene Glycol Loxenatide.  If your CBG is greater than 220 mg/dL, you may take  of your sliding scale  (correction) dose of insulin.    For patients with insulin pumps: Contact your diabetes doctor for specific instructions before surgery. Decrease basal rates by 20% at midnight the night before your surgery. Note that if your surgery is planned to be longer than 2 hours, your insulin pump will be removed and intravenous (IV) insulin will be started and managed by the nurses and the anesthesiologist. You will be able to restart your insulin pump once you are awake and able to manage it.  Make sure to bring insulin pump supplies to the hospital with you in case the  site needs to be changed.  Patient Signature:  Date:   Nurse Signature:  Date:   Reviewed and Endorsed by [facility] Patient Education Committee, August 2015   Bring CPAP mask and tubing day of surgery.                              You may not have any metal on your body including hair pins, jewelry, and body piercing             Do not wear , lotions, powders, perfumes/cologne, or deodorant  .               Men may shave face and neck.   Do not bring valuables to the hospital. [place].   Contacts, glasses, dentures or bridgework may not be worn into surgery.   Bring small overnight bag day of surgery.   DO NOT [place]. PHARMACY WILL DISPENSE MEDICATIONS LISTED ON YOUR MEDICATION LIST TO YOU DURING YOUR ADMISSION [place]!    Patients discharged on the day of surgery will not be allowed to drive home.  Someone NEEDS to stay with you for the first 24 hours after anesthesia.   Special Instructions: Bring a copy of your healthcare power of attorney and living will documents the day of surgery if  you haven't scanned them before.              Please read over the following fact sheets you were given: [place] [phone]   If you received a COVID test during your pre-op visit  it is requested that you wear a mask when out in public, stay away  from anyone that may not be feeling well and notify your surgeon if you develop symptoms. If you test positive for Covid or have been in contact with anyone that has tested positive in the last 10 days please notify you surgeon.    [place] - Preparing for Surgery Before surgery, you can play an important role.  Because skin is not sterile, your skin needs to be as free of germs as possible.  You can reduce the number of germs on your skin by washing with CHG (chlorahexidine gluconate) soap before surgery.  CHG is an antiseptic cleaner which kills germs and bonds with the skin to continue killing germs even after washing. Please DO NOT use if you have an allergy to CHG or antibacterial soaps.  If your skin becomes reddened/irritated stop using the CHG and inform your nurse when you arrive at Short Stay. Do not shave (including legs and underarms) for at least 48 hours prior to the first CHG shower.  You may shave your face/neck. Please follow these instructions carefully:  1.  Shower with CHG Soap the night before surgery and the  morning of Surgery.  2.  If you choose to wash your hair, wash your hair first as usual with your  normal  shampoo.  3.  After you shampoo, rinse your hair and body thoroughly to remove the  shampoo.                           4.  Use CHG as you would any other liquid soap.  You can apply chg directly  to the skin and wash                       Gently with a scrungie or clean washcloth.  5.  Apply the CHG Soap to your body ONLY FROM THE NECK DOWN.   Do not use on face/ open                           Wound or open sores. Avoid contact with eyes, ears mouth and genitals  (private parts).                       Wash face,  Genitals (private parts) with your normal soap.             6.  Wash thoroughly, paying special attention to the area where your surgery  will be performed.  7.  Thoroughly rinse your body with warm water from the neck down.  8.  DO NOT shower/wash with your normal soap after using and rinsing off  the CHG Soap.                9.  Pat yourself dry with a clean towel.            10.  Wear clean pajamas.            11.  Place clean sheets on your bed the night of your first shower and do not  sleep with pets. Day of Surgery : Do not apply any lotions/deodorants the morning of surgery.  Please wear clean clothes to the hospital/surgery center.  FAILURE TO FOLLOW THESE INSTRUCTIONS MAY RESULT IN THE CANCELLATION OF YOUR SURGERY PATIENT SIGNATURE_________________________________  NURSE SIGNATURE__________________________________  ________________________________________________________________________

## 2022-02-21 ENCOUNTER — Encounter: Payer: Self-pay | Admitting: Internal Medicine

## 2022-02-21 ENCOUNTER — Encounter (HOSPITAL_COMMUNITY): Payer: Self-pay

## 2022-02-21 ENCOUNTER — Encounter (HOSPITAL_COMMUNITY)
Admission: RE | Admit: 2022-02-21 | Discharge: 2022-02-21 | Disposition: A | Discharge status: Home / Self Care | Payer: Medicare Other | Source: Ambulatory Visit | Attending: Urology | Admitting: Urology

## 2022-02-21 ENCOUNTER — Other Ambulatory Visit: Payer: Self-pay

## 2022-02-21 ENCOUNTER — Telehealth: Payer: Self-pay | Admitting: *Deleted

## 2022-02-21 VITALS — BP 135/77 | HR 75 | Temp 97.6°F | Ht 68.0 in | Wt 143.0 lb

## 2022-02-21 DIAGNOSIS — E119 Type 2 diabetes mellitus without complications: Secondary | ICD-10-CM | POA: Insufficient documentation

## 2022-02-21 DIAGNOSIS — Z01818 Encounter for other preprocedural examination: Secondary | ICD-10-CM | POA: Diagnosis not present

## 2022-02-21 DIAGNOSIS — I4510 Unspecified right bundle-branch block: Secondary | ICD-10-CM | POA: Diagnosis not present

## 2022-02-21 DIAGNOSIS — Z79899 Other long term (current) drug therapy: Secondary | ICD-10-CM | POA: Diagnosis not present

## 2022-02-21 LAB — BASIC METABOLIC PANEL
Anion gap: 12 (ref 5–15)
BUN: 20 mg/dL (ref 8–23)
CO2: 25 mmol/L (ref 22–32)
Calcium: 9 mg/dL (ref 8.9–10.3)
Chloride: 95 mmol/L — ABNORMAL LOW (ref 98–111)
Creatinine, Ser: 0.88 mg/dL (ref 0.61–1.24)
GFR, Estimated: >60 mL/min (ref >60)
Glucose, Bld: 120 mg/dL — ABNORMAL HIGH (ref 70–99)
Potassium: 3.8 mmol/L (ref 3.5–5.1)
Sodium: 132 mmol/L — ABNORMAL LOW (ref 135–145)

## 2022-02-21 LAB — CBC
HCT: 39.1 % (ref 39.0–52.0)
Hemoglobin: 12.7 g/dL — ABNORMAL LOW (ref 13.0–17.0)
MCH: 28.9 pg (ref 26.0–34.0)
MCHC: 32.5 g/dL (ref 30.0–36.0)
MCV: 89.1 fL (ref 80.0–100.0)
Platelets: 213 10*3/uL (ref 150–400)
RBC: 4.39 MIL/uL (ref 4.22–5.81)
RDW: 14.2 % (ref 11.5–15.5)
WBC: 8.8 10*3/uL (ref 4.0–10.5)
nRBC: 0 % (ref 0.0–0.2)

## 2022-02-21 LAB — GLUCOSE, CAPILLARY: Glucose-Capillary: 140 mg/dL — ABNORMAL HIGH (ref 70–99)

## 2022-02-21 NOTE — Progress Notes (Signed)
For Short Stay: [place] appointment date:  Bowel Prep reminder:   For Anesthesia: PCP - Dr. Dimas Chyle. LOV: 01/29/22: Clearance: EPIC. Cardiologist - Dr. Lake Bells O'Neal:LOV: 03/03/20 Endocrinologist: Philemon Kingdom. LOV: 09/11/21 Chest x-ray -  EKG -  Stress Test -  ECHO - 2022 Cardiac Cath -  Pacemaker/ICD device last checked: Pacemaker orders received: Device Rep notified:  Spinal Cord Stimulator:  Sleep Study -  CPAP -   Fasting Blood Sugar - 100's Checks Blood Sugar : continuous CBG monitor Date and result of last Hgb A1c-6.0: 01/02/22  Last dose of GLP1 agonist- Ozempic: Last dose : 02/19/22 GLP1 instructions:   Last dose of SGLT-2 inhibitors-  SGLT-2 instructions:   Blood Thinner Instructions: To hold 5 days before surgery Aspirin Instructions: Last Dose:  Activity level: Can go up a flight of stairs and activities of daily living without stopping and without chest pain and/or shortness of breath   Able to exercise without chest pain and/or shortness of breath     Anesthesia review: Hx: CAD,DIA.  Patient denies shortness of breath, fever, cough and chest pain at PAT appointment   Patient verbalized understanding of instructions that were given to them at the PAT appointment. Patient was also instructed that they will need to review over the PAT instructions again at home before surgery.

## 2022-02-21 NOTE — Progress Notes (Signed)
Diabetes coordinator has been notified about this pt.All the information was provided over the phone to the RN coordinator.Pt. was advised to communicate with endocrinologist to consult about his pump setting the night before and during surgery.

## 2022-02-22 ENCOUNTER — Encounter (HOSPITAL_COMMUNITY): Admission: RE | Admit: 2022-02-22 | Payer: Medicare Other | Source: Ambulatory Visit

## 2022-02-22 LAB — URINE CULTURE: Culture: NO GROWTH

## 2022-02-22 NOTE — Telephone Encounter (Signed)
Please see the MyChart message reply(ies) for my assessment and plan.    This patient gave consent for this Medical Advice Message and is aware that it may result in a bill to [employer], as well as the possibility of receiving a bill for a co-payment or deductible. They are an established patient, but are not seeking medical advice exclusively about a problem treated during an in person or video visit in the last seven days. I did not recommend an in person or video visit within seven days of my reply.    I spent a total of 8 minutes cumulative time within 7 days through [employer].  Philemon Kingdom, MD

## 2022-02-23 ENCOUNTER — Encounter: Payer: Self-pay | Admitting: Internal Medicine

## 2022-02-25 ENCOUNTER — Other Ambulatory Visit: Payer: Self-pay

## 2022-02-25 DIAGNOSIS — E119 Type 2 diabetes mellitus without complications: Secondary | ICD-10-CM

## 2022-02-25 MED ORDER — METFORMIN HCL ER 500 MG PO TB24: 1000.0000 mg | ORAL_TABLET | Freq: Two times a day (BID) | ORAL | 1 refills | Status: DC

## 2022-02-26 ENCOUNTER — Ambulatory Visit (INDEPENDENT_AMBULATORY_CARE_PROVIDER_SITE_OTHER): Payer: Medicare Other | Admitting: Internal Medicine

## 2022-02-26 ENCOUNTER — Encounter: Payer: Self-pay | Admitting: Internal Medicine

## 2022-02-26 VITALS — BP 120/72 | HR 86 | Ht 68.0 in | Wt 141.6 lb

## 2022-02-26 DIAGNOSIS — E785 Hyperlipidemia, unspecified: Secondary | ICD-10-CM | POA: Diagnosis not present

## 2022-02-26 DIAGNOSIS — E1169 Type 2 diabetes mellitus with other specified complication: Secondary | ICD-10-CM | POA: Diagnosis not present

## 2022-02-26 DIAGNOSIS — E039 Hypothyroidism, unspecified: Secondary | ICD-10-CM

## 2022-02-26 DIAGNOSIS — E119 Type 2 diabetes mellitus without complications: Secondary | ICD-10-CM

## 2022-02-26 LAB — POCT GLYCOSYLATED HEMOGLOBIN (HGB A1C): Hemoglobin A1C: 6 % — AB (ref 4.0–5.6)

## 2022-02-26 NOTE — Progress Notes (Signed)
Patient ID: David Maynard, male   DOB: 1950-10-19, 72 y.o.   MRN: [id number]  HPI: [name] is a 72 y.o.-year-old male, returning for follow-up for DM2, diagnosed in 2008, on insulin since 2021, controlled, with complications (CAD).  He previously saw Dr. Everardo All, last visit 6 months ago.  Interim history: No increased urination, blurry vision, nausea, chest pain. He is preparing for surgery tomorrow (penile implant). He just returned from [place].  Reviewed HbA1c levels: Lab Results  Component Value Date   HGBA1C 6.0 (A) 01/02/2022   HGBA1C 5.7 (A) 09/11/2021   HGBA1C 5.7 (A) 06/09/2021   Insulin pump:  -Omnipod 5  CGM: -Dexcom G6  Insulin: -Humalog  Supplies: -Dexcom from [employer] -Omnipod from [employer]  Pump settings: - basal rates: 12 am: 0.3 units/h  5 am: 0.45 7 am: 0.3 - ICR: 1: 8 >> 7 >> 6 - target: 110 - ISF: 1: 50 >> 40 - Active insulin time: 4h - extended bolusing: not using - changes infusion site: q3 days TDD from basal insulin: 8.6 >> 10.3 units (28%) >> 35% TDD from bolus insulin: 22.7 >> 26.4 units (72%) >> 65% Total daily dose: 31-50 units /day  He is also on: -  >> stopped 08/2021 - Victoza 1.8 mg in a.m. >> Ozempic 0.5 mg weekly - Metformin ER 1000 mg 2x a day >> 2000 mg with dinner  Meter: Accu-Chek guide  Pt checks his sugars more than 4 times a day:   Previously:  Previously:  Lowest sugar was 54 >> 50s; he has hypoglycemia awareness at 60.  He does not have a glucagon kit at home.  His wife is an Charity fundraiser and NP. Highest sugar was 200s >> 206 >> 254.  - no CKD, last BUN/creatinine:  Lab Results  Component Value Date   BUN 20 02/21/2022   BUN 23 03/13/2021   CREATININE 0.88 02/21/2022   CREATININE 0.96 03/13/2021   - + HL; last set of lipids: Lab Results  Component Value Date   CHOL 131 03/13/2021   HDL 85.10 03/13/2021   LDLCALC 35 03/13/2021   TRIG 50.0 03/13/2021   CHOLHDL 2 03/13/2021  On Lipitor 40 mg daily, Welchol  650 x 2 tablets twice a day, omega-3 fatty acids 2000 mg twice a day.  - last eye exam was 11/27/2021. No DR. Cataract sx 2021.  His vision improved significantly afterwards.  - no numbness and tingling in his feet.  Last foot exam August 15, 2020.  On ASA 81.  Last TSH: Lab Results  Component Value Date   TSH 5.04 03/13/2021   He is on Levothyroxine 75 mcg daily.  He has family history of type 1 diabetes - son.  He also has a history of ADD, B12 deficiency, BPH, GERD, hypogonadism, osteoarthritis, depression, anemia..  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   and left  Thyroid disease    Unspecified hypothyroidism    Past Surgical History:  Procedure Laterality Date   COLONOSCOPY     COLONOSCOPY WITH PROPOFOL  02/28/2021   ESOPHAGOGASTRODUODENOSCOPY  06/16/2003   EYE SURGERY     INGUINAL HERNIA REPAIR     left   JOINT REPLACEMENT     KNEE ARTHROSCOPY     Left   KNEE ARTHROSCOPY W/ ACL RECONSTRUCTION     left   ROTATOR CUFF REPAIR     Right x2   Stress Cardiolite  07/15/2000   TOTAL KNEE ARTHROPLASTY Right 11/04/2017   TOTAL KNEE ARTHROPLASTY Right 11/04/2017   Procedure: TOTAL KNEE ARTHROPLASTY;  Surgeon: Elsie Saas, MD;  Location: [facility];  Service: Orthopedics;  Laterality: Right;   TOTAL KNEE ARTHROPLASTY Left 12/23/2017   TOTAL KNEE ARTHROPLASTY Left 12/23/2017   Procedure: TOTAL KNEE ARTHROPLASTY;  Surgeon: Elsie Saas, MD;  Location: [facility];  Service: Orthopedics;  Laterality: Left;   VASECTOMY  05/2002   Social History   Socioeconomic History   Marital status: Married    Spouse name: Not on file   Number of children: 2   Years of education: Not on file   Highest education level: Not on file  Occupational History   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  Tobacco Use   Smoking status: Never   Smokeless tobacco: Never  Vaping Use   Vaping Use: Never used  Substance and Sexual Activity   Alcohol use: Yes    Alcohol/week: 1.0 standard drink of alcohol    Types: 1 Cans of beer per week    Comment: weekly   Drug use: No   Sexual activity: Not on file  Other Topics Concern   Not on file  Social History Narrative   Not on file   Social Determinants of Health   Financial Resource Strain: Low Risk  (10/09/2021)   Overall Financial Resource Strain (CARDIA)    Difficulty of Paying Living Expenses: Not hard at all  Food Insecurity: No Food Insecurity (10/09/2021)   Hunger Vital Sign    Worried About Running Out of Food in the Last Year: Never true    Ran Out of Food in the Last Year: Never true  Transportation Needs: No Transportation Needs (10/09/2021)   PRAPARE - Hydrologist (Medical): No    Lack of Transportation (Non-Medical): No  Physical Activity: Sufficiently Active (10/09/2021)   Exercise Vital Sign    Days of Exercise per Week: 5 days    Minutes of Exercise per Session: 120 min  Stress: No Stress Concern Present (10/09/2021)   [place]    Feeling of Stress : Not at all  Social Connections: [place] (10/09/2021)   Social Connection and Isolation Panel [NHANES]    Frequency of Communication with Friends and Family: More than three times a week    Frequency of Social Gatherings with Friends and Family: More than three times a week    Attends Religious Services: More than 4 times per year     Active Member of [employer] or Organizations: Yes    Attends Archivist Meetings: 1 to 4 times per year    Marital Status: Married  Human resources officer Violence: Not At Risk (10/09/2021)   Humiliation, Afraid, Rape, and Kick questionnaire    Fear of Current or Ex-Partner: No    Emotionally Abused: No    Physically Abused: No  Sexually Abused: No   Current Outpatient Medications on File Prior to Visit  Medication Sig Dispense Refill   ACCU-CHEK GUIDE test strip USE TO CHECK BLOOD SUGAR 6  TIMES DAILY 600 strip 3   ascorbic acid (VITAMIN C) 500 MG tablet Take 1 tablet (500 mg total) by mouth daily. 20 tablet 0   aspirin EC 81 MG tablet Take 81 mg by mouth daily. Swallow whole.     atorvastatin (LIPITOR) 40 MG tablet TAKE 1 TABLET BY MOUTH  DAILY 90 tablet 3   B-D 3CC LUER-LOK SYR 25GX1" 25G X 1" 3 ML MISC USE AS DIRECTED MONTHLY FOR B-12 INJECTIONS 3 each 0   Blood Glucose Monitoring Suppl (ACCU-CHEK GUIDE ME) w/Device KIT USE AS DIRECTED 1 kit 0   Calcium Carbonate (CALCIUM 600 PO) Take 1 tablet by mouth daily.     Cinnamon 500 MG capsule Take 3,000 mg by mouth 2 (two) times daily.     clomiPHENE (CLOMID) 50 MG tablet TAKE ONE-HALF TABLET BY  MOUTH DAILY (Patient not taking: Reported on 02/20/2022) 45 tablet 3   colesevelam (WELCHOL) 625 MG tablet Take 4 tablets (2,500 mg total) by mouth daily. (Patient taking differently: Take 1,250 mg by mouth 2 (two) times daily with a meal.) 360 tablet 3   Continuous Blood Gluc Sensor (DEXCOM G6 SENSOR) MISC 1 Device by Does not apply route See admin instructions. 1 every 10 days 9 each 3   cyanocobalamin (DODEX) 1000 MCG/ML injection Inject 1 mL (1,000 mcg total) into the muscle every 30 (thirty) days. 3 mL 0   docusate sodium (COLACE) 100 MG capsule Take 100 mg by mouth 2 (two) times daily. Takes daily     Glucagon 3 MG/DOSE POWD Place 3 mg into the nose once as needed for up to 1 dose. 1 each 11   Glucosamine-Chondroit-Vit C-Mn (GLUCOSAMINE CHONDR  1500 COMPLX PO) Take 2 tablets by mouth daily.     Insulin Disposable Pump (OMNIPOD 5 G6 POD, GEN 5,) MISC Change pod every 72 hours. 10 each 10   insulin lispro (HUMALOG) 100 UNIT/ML injection For use in pump, total of 50 units per day. 50 mL 3   Insulin Pen Needle 31G X 5 MM MISC 1 Device by Does not apply route in the morning and at bedtime. 200 each 3   levothyroxine (SYNTHROID) 75 MCG tablet Take 1 tablet (75 mcg total) by mouth daily. 90 tablet 3   metFORMIN (GLUCOPHAGE-XR) 500 MG 24 hr tablet Take 2 tablets (1,000 mg total) by mouth 2 (two) times daily. 360 tablet 1   Multiple Vitamin (MULTIVITAMIN) tablet Take 1 tablet by mouth daily.     MYRBETRIQ 50 MG TB24 tablet Take 50 mg by mouth daily.     omega-3 acid ethyl esters (LOVAZA) 1 g capsule TAKE 2 CAPSULES BY MOUTH  TWICE DAILY 360 capsule 3   OZEMPIC, 0.25 OR 0.5 MG/DOSE, 2 MG/3ML SOPN Inject 2 mg into the skin once a week.     Semaglutide,0.25 or 0.5MG /DOS, (OZEMPIC, 0.25 OR 0.5 MG/DOSE,) 2 MG/1.5ML SOPN Inject 0.5 mg into the skin once a week. (Patient not taking: Reported on 02/20/2022) 4.5 mL 5   tadalafil (ADCIRCA/CIALIS) 20 MG tablet Take 20 mg by mouth daily as needed for erectile dysfunction.     Trospium Chloride 60 MG CP24 Take 60 mg by mouth daily.     Turmeric 500 MG CAPS Take 500-1,000 mg by mouth daily. May take an extra dose if plays golf or  tennis     VITAMIN D, CHOLECALCIFEROL, PO Take 1 tablet by mouth daily.     vitamin E 200 UNIT capsule Take 200 Units by mouth daily.     No current facility-administered medications on file prior to visit.   No Known Allergies Family History  Problem Relation Age of Onset   Hypertension Mother    Kidney disease Father    Breast cancer Neg Hx    Celiac disease Neg Hx    Cirrhosis Neg Hx    Clotting disorder Neg Hx    Colitis Neg Hx    Colon cancer Neg Hx    Colon polyps Neg Hx    Crohn's disease Neg Hx    Cystic fibrosis Neg Hx    Diabetes Neg Hx    Esophageal cancer Neg  Hx    Heart disease Neg Hx    Hemochromatosis Neg Hx    Inflammatory bowel disease Neg Hx    Irritable bowel syndrome Neg Hx    Liver cancer Neg Hx    Liver disease Neg Hx    Ovarian cancer Neg Hx    Pancreatic cancer Neg Hx    Prostate cancer Neg Hx    Rectal cancer Neg Hx    Stomach cancer Neg Hx    Ulcerative colitis Neg Hx    Uterine cancer Neg Hx    Wilson's disease Neg Hx    PE: BP 120/72 (BP Location: Left Arm, Patient Position: Sitting, Cuff Size: Normal)   Pulse 86   Ht 5\' 8"  (1.727 m)   Wt 141 lb 9.6 oz (64.2 kg)   SpO2 97%   BMI 21.53 kg/m  Wt Readings from Last 3 Encounters:  02/26/22 141 lb 9.6 oz (64.2 kg)  02/21/22 143 lb (64.9 kg)  01/29/22 143 lb (64.9 kg)   Constitutional: normal weight, in NAD Eyes: EOMI, no exophthalmos ENT:  no thyromegaly, no cervical lymphadenopathy Cardiovascular: RRR, No MRG Respiratory: CTA B Musculoskeletal: no deformities Skin: no rashes Neurological: no tremor with outstretched hands Diabetic Foot Exam - Simple   Simple Foot Form Diabetic Foot exam was performed with the following findings: Yes 02/26/2022  2:15 PM  Visual Inspection No deformities, no ulcerations, no other skin breakdown bilaterally: Yes Sensation Testing Intact to touch and monofilament testing bilaterally: Yes Pulse Check Posterior Tibialis and Dorsalis pulse intact bilaterally: Yes Comments Thick halluceal nails     ASSESSMENT: 1. DM2, controlled, with complications - CAD  2. HL  PLAN:  1. Patient with longstanding, extremely well-controlled type 1 diabetes.  Latest HbA1c was slightly higher, but still excellent, at 6.0%.  At last visit, we stopped Actos and continued metformin and Victoza along with his insulin.  He uses an OmniPod 5 insulin pump integrated with the Dexcom CGM.  We did not change his pump settings at last visit as the sugars were excellent throughout the day.  He had occasional higher blood sugars after dinner and a week prior  to our last visit as he was out of town.  Otherwise,  - He is an avid Firefighter.  His sugars were decreasing slightly during tennis previously but not enough to stop the pump or change to exercise mode.  He tried this before but did not like the higher target.  He also plays golf. CGM interpretation: -At today's visit, we reviewed his CGM downloads: It appears that 96% of values are in target range (goal >70%), while 3% are higher than 180 (goal <25%), and 1%  are lower than 70 (goal <4%).  The calculated average blood sugar is 120.  The projected HbA1c for the next 3 months (GMI) is 6.2%. -Reviewing the CGM trends, sugars appear to be well-controlled, fluctuating within the target range, despite the fact that he was in vacation in [place] during this period.  For now, I did not suggest any changes in his bolus regimen, however, he mentions that he occasionally drops his sugars during the night.  We discussed about reducing the basal rates during the night but if he still has low blood sugars, I advised him to increase his target from 110-120 during the night, and if the lows persist, to relax his insulin to carb ratio with dinner. -We also discussed about changes in his regimen for his surgery tomorrow.  I advised him not to take metformin tonight and to reduce the basal rate overnight. I advised him to: Patient Instructions  Please use the following pump settings: - basal rates: 12 am: 0.3 units/h >> 0.25 units/h 5 am: 0.45 7 am: 0.3 - ICR: 1:6 - target: 110 - ISF: 1:40 - Active insulin time: 4h  Please continue: - Metformin ER 2000 mg daily - Ozempic 0.5 mg weekly  Do not take the metformin tonight. For tonight, increase the target to 130. After the surgery, change the target back to 110, but be aware you may need to increase it to 120 if the sugars are still dropping overnight. If the sugars remain low overnight, need to relax the insulin to carb ratio for dinner to 1:7.  Please  return in ~6 months  - we checked his HbA1c: 6.0% (stable, excellent) - advised to check sugars at different times of the day - 4x a day, rotating check times - advised for yearly eye exams >> he is UTD - return to clinic in 6 months - he prefers to come every 3 months to find out his HbA1c >> I advised him that he can come to have this done and then I can see him back in 6 months -he agrees with this plan  2. HL -Reviewed latest lipid panel: Fractions at goal: Lab Results  Component Value Date   CHOL 131 03/13/2021   HDL 85.10 03/13/2021   LDLCALC 35 03/13/2021   TRIG 50.0 03/13/2021   CHOLHDL 2 03/13/2021  -He continues on Lipitor 40 mg daily, WelChol 1300 mg twice a day, omega-3 fatty acids 2000 mg twice a day-no side effects -She is due for another lipid panel has appointment with PCP coming up later this month. -   3.  Hypothyroidism - latest thyroid labs reviewed with pt. >> normal: Lab Results  Component Value Date   TSH 5.04 03/13/2021  - he continues on LT4 75 mcg daily - pt feels good on this dose. - we discussed about taking the thyroid hormone every day, with water, >30 minutes before breakfast, separated by >4 hours from acid reflux medications, calcium, iron, multivitamins. Pt. is taking it correctly. - will check his TSH at next visit with PCP  Philemon Kingdom, MD PhD [facility] Endocrinology

## 2022-02-26 NOTE — Patient Instructions (Addendum)
Please use the following pump settings: - basal rates: 12 am: 0.3 units/h >> 0.25 units/h 5 am: 0.45 7 am: 0.3 - ICR: 1:6 - target: 110 - ISF: 1:40 - Active insulin time: 4h  Please continue: - Metformin ER 2000 mg daily - Ozempic 0.5 mg weekly  Do not take the metformin tonight. For tonight, increase the target to 130. After the surgery, change the target back to 110, but be aware you may need to increase it to 120 if the sugars are still dropping overnight. If the sugars remain low overnight, need to relax the insulin to carb ratio for dinner to 1:7.  Please return in ~6 months

## 2022-02-27 ENCOUNTER — Other Ambulatory Visit: Payer: Self-pay

## 2022-02-27 ENCOUNTER — Encounter (HOSPITAL_COMMUNITY): Payer: Self-pay | Admitting: Urology

## 2022-02-27 ENCOUNTER — Ambulatory Visit (HOSPITAL_COMMUNITY): Payer: Medicare Other | Admitting: Physician Assistant

## 2022-02-27 ENCOUNTER — Ambulatory Visit (HOSPITAL_BASED_OUTPATIENT_CLINIC_OR_DEPARTMENT_OTHER): Payer: Medicare Other | Admitting: Anesthesiology

## 2022-02-27 ENCOUNTER — Encounter (HOSPITAL_COMMUNITY)
Admission: RE | Disposition: A | Discharge status: Home / Self Care | Payer: Self-pay | Source: Home / Self Care | Attending: Urology

## 2022-02-27 ENCOUNTER — Observation Stay (HOSPITAL_COMMUNITY)
Admission: RE | Admit: 2022-02-27 | Discharge: 2022-02-28 | Disposition: A | Discharge status: Home / Self Care | Payer: Medicare Other | Attending: Urology | Admitting: Urology

## 2022-02-27 DIAGNOSIS — Z79899 Other long term (current) drug therapy: Secondary | ICD-10-CM | POA: Diagnosis not present

## 2022-02-27 DIAGNOSIS — Z960 Presence of urogenital implants: Secondary | ICD-10-CM

## 2022-02-27 DIAGNOSIS — Z794 Long term (current) use of insulin: Secondary | ICD-10-CM | POA: Insufficient documentation

## 2022-02-27 DIAGNOSIS — N529 Male erectile dysfunction, unspecified: Secondary | ICD-10-CM | POA: Diagnosis present

## 2022-02-27 DIAGNOSIS — E039 Hypothyroidism, unspecified: Secondary | ICD-10-CM

## 2022-02-27 DIAGNOSIS — Z7984 Long term (current) use of oral hypoglycemic drugs: Secondary | ICD-10-CM

## 2022-02-27 DIAGNOSIS — E119 Type 2 diabetes mellitus without complications: Secondary | ICD-10-CM

## 2022-02-27 DIAGNOSIS — N528 Other male erectile dysfunction: Secondary | ICD-10-CM

## 2022-02-27 DIAGNOSIS — I2510 Atherosclerotic heart disease of native coronary artery without angina pectoris: Secondary | ICD-10-CM | POA: Insufficient documentation

## 2022-02-27 DIAGNOSIS — Z96653 Presence of artificial knee joint, bilateral: Secondary | ICD-10-CM | POA: Diagnosis not present

## 2022-02-27 HISTORY — PX: PENILE PROSTHESIS IMPLANT: SHX240

## 2022-02-27 LAB — GLUCOSE, CAPILLARY
Glucose-Capillary: 140 mg/dL — ABNORMAL HIGH (ref 70–99)
Glucose-Capillary: 117 mg/dL — ABNORMAL HIGH (ref 70–99)
Glucose-Capillary: 181 mg/dL — ABNORMAL HIGH (ref 70–99)
Glucose-Capillary: 214 mg/dL — ABNORMAL HIGH (ref 70–99)

## 2022-02-27 SURGERY — INSERTION, PENILE PROSTHESIS, INFLATABLE
Anesthesia: General

## 2022-02-27 MED ORDER — ONDANSETRON HCL 4 MG/2ML IJ SOLN
INTRAMUSCULAR | Status: AC
  Filled 2022-02-27: qty 2

## 2022-02-27 MED ORDER — DEXAMETHASONE SODIUM PHOSPHATE 10 MG/ML IJ SOLN
INTRAMUSCULAR | Status: DC | PRN
  Administered 2022-02-27: 4 mg via INTRAVENOUS

## 2022-02-27 MED ORDER — PROPOFOL 10 MG/ML IV BOLUS
INTRAVENOUS | Status: DC | PRN
  Administered 2022-02-27: 150 mg via INTRAVENOUS

## 2022-02-27 MED ORDER — ACETAMINOPHEN 500 MG PO TABS
1000.0000 mg | ORAL_TABLET | Freq: Four times a day (QID) | ORAL | Status: DC
  Administered 2022-02-27 – 2022-02-28 (×3): 1000 mg via ORAL
  Filled 2022-02-27 (×3): qty 2

## 2022-02-27 MED ORDER — ONDANSETRON HCL 4 MG/2ML IJ SOLN: 4.0000 mg | Freq: Four times a day (QID) | INTRAMUSCULAR | Status: DC | PRN

## 2022-02-27 MED ORDER — FLUCONAZOLE IN SODIUM CHLORIDE 400-0.9 MG/200ML-% IV SOLN
400.0000 mg | INTRAVENOUS | Status: DC
  Administered 2022-02-27: 400 mg via INTRAVENOUS
  Filled 2022-02-27: qty 200

## 2022-02-27 MED ORDER — PHENYLEPHRINE 80 MCG/ML (10ML) SYRINGE FOR IV PUSH (FOR BLOOD PRESSURE SUPPORT)
PREFILLED_SYRINGE | INTRAVENOUS | Status: AC
  Filled 2022-02-27: qty 10

## 2022-02-27 MED ORDER — INSULIN ASPART 100 UNIT/ML IJ SOLN
0.0000 [IU] | Freq: Three times a day (TID) | INTRAMUSCULAR | Status: DC
  Administered 2022-02-27: 3 [IU] via SUBCUTANEOUS
  Administered 2022-02-28: 2 [IU] via SUBCUTANEOUS

## 2022-02-27 MED ORDER — SODIUM CHLORIDE 0.9 % IR SOLN
Status: DC | PRN
  Administered 2022-02-27: 1000 mL

## 2022-02-27 MED ORDER — FENTANYL CITRATE PF 50 MCG/ML IJ SOSY
PREFILLED_SYRINGE | INTRAMUSCULAR | Status: AC
  Filled 2022-02-27: qty 1

## 2022-02-27 MED ORDER — ATORVASTATIN CALCIUM 40 MG PO TABS
40.0000 mg | ORAL_TABLET | Freq: Every day | ORAL | Status: DC
  Administered 2022-02-27 – 2022-02-28 (×2): 40 mg via ORAL
  Filled 2022-02-27 (×2): qty 1

## 2022-02-27 MED ORDER — GENTAMICIN SULFATE 40 MG/ML IJ SOLN
120.0000 mg | INTRAVENOUS | Status: AC
  Administered 2022-02-27: 120 mg via INTRAVENOUS
  Filled 2022-02-27: qty 3

## 2022-02-27 MED ORDER — IRRISEPT - 450ML BOTTLE WITH 0.05% CHG IN STERILE WATER, USP 99.95% OPTIME
TOPICAL | Status: DC | PRN
  Administered 2022-02-27: 1350 mL via TOPICAL

## 2022-02-27 MED ORDER — DOCUSATE SODIUM 100 MG PO CAPS
100.0000 mg | ORAL_CAPSULE | Freq: Two times a day (BID) | ORAL | Status: DC
  Administered 2022-02-27 – 2022-02-28 (×2): 100 mg via ORAL
  Filled 2022-02-27 (×3): qty 1

## 2022-02-27 MED ORDER — GENTAMICIN IN SALINE 1.2-0.9 MG/ML-% IV SOLN
120.0000 mg | INTRAVENOUS | Status: DC
  Filled 2022-02-27: qty 100

## 2022-02-27 MED ORDER — HYDROMORPHONE HCL 1 MG/ML IJ SOLN: 0.5000 mg | INTRAMUSCULAR | Status: DC | PRN

## 2022-02-27 MED ORDER — LIDOCAINE HCL (CARDIAC) PF 100 MG/5ML IV SOSY
PREFILLED_SYRINGE | INTRAVENOUS | Status: DC | PRN
  Administered 2022-02-27: 50 mg via INTRAVENOUS

## 2022-02-27 MED ORDER — GENTAMICIN IN SALINE 1.2-0.9 MG/ML-% IV SOLN
120.0000 mg | INTRAVENOUS | Status: DC
  Filled 2022-02-27 (×2): qty 100

## 2022-02-27 MED ORDER — ORAL CARE MOUTH RINSE: 15.0000 mL | Freq: Once | OROMUCOSAL | Status: AC

## 2022-02-27 MED ORDER — GENTAMICIN IN SALINE 1.2-0.9 MG/ML-% IV SOLN: 120.0000 mg | INTRAVENOUS | Status: DC

## 2022-02-27 MED ORDER — MUPIROCIN 2 % EX OINT
TOPICAL_OINTMENT | Freq: Once | CUTANEOUS | Status: AC
  Filled 2022-02-27: qty 22

## 2022-02-27 MED ORDER — ROCURONIUM BROMIDE 100 MG/10ML IV SOLN
INTRAVENOUS | Status: DC | PRN
  Administered 2022-02-27: 10 mg via INTRAVENOUS
  Administered 2022-02-27: 60 mg via INTRAVENOUS

## 2022-02-27 MED ORDER — ONDANSETRON HCL 4 MG/2ML IJ SOLN
4.0000 mg | INTRAMUSCULAR | Status: DC | PRN
  Administered 2022-02-27: 4 mg via INTRAVENOUS
  Filled 2022-02-27: qty 2

## 2022-02-27 MED ORDER — MIDAZOLAM HCL 5 MG/5ML IJ SOLN
INTRAMUSCULAR | Status: DC | PRN
  Administered 2022-02-27 (×2): 1 mg via INTRAVENOUS

## 2022-02-27 MED ORDER — FENTANYL CITRATE PF 50 MCG/ML IJ SOSY
25.0000 ug | PREFILLED_SYRINGE | INTRAMUSCULAR | Status: DC | PRN
  Administered 2022-02-27 (×2): 25 ug via INTRAVENOUS

## 2022-02-27 MED ORDER — OXYCODONE HCL 5 MG PO TABS: 5.0000 mg | ORAL_TABLET | ORAL | Status: DC | PRN

## 2022-02-27 MED ORDER — MIDAZOLAM HCL 2 MG/2ML IJ SOLN
INTRAMUSCULAR | Status: AC
  Filled 2022-02-27: qty 2

## 2022-02-27 MED ORDER — OXYCODONE HCL 5 MG PO TABS: 5.0000 mg | ORAL_TABLET | Freq: Once | ORAL | Status: DC | PRN

## 2022-02-27 MED ORDER — STERILE WATER FOR IRRIGATION IR SOLN
Status: DC | PRN
  Administered 2022-02-27: 500 mL

## 2022-02-27 MED ORDER — FENTANYL CITRATE (PF) 250 MCG/5ML IJ SOLN
INTRAMUSCULAR | Status: AC
  Filled 2022-02-27: qty 5

## 2022-02-27 MED ORDER — PROPOFOL 10 MG/ML IV BOLUS
INTRAVENOUS | Status: AC
  Filled 2022-02-27: qty 20

## 2022-02-27 MED ORDER — PHENYLEPHRINE HCL (PRESSORS) 10 MG/ML IV SOLN
INTRAVENOUS | Status: DC | PRN
  Administered 2022-02-27: 40 ug via INTRAVENOUS
  Administered 2022-02-27: 80 ug via INTRAVENOUS
  Administered 2022-02-27: 40 ug via INTRAVENOUS
  Administered 2022-02-27: 80 ug via INTRAVENOUS
  Administered 2022-02-27: 100 ug via INTRAVENOUS

## 2022-02-27 MED ORDER — CELECOXIB 200 MG PO CAPS
200.0000 mg | ORAL_CAPSULE | Freq: Two times a day (BID) | ORAL | Status: DC
  Administered 2022-02-27 – 2022-02-28 (×2): 200 mg via ORAL
  Filled 2022-02-27 (×3): qty 1

## 2022-02-27 MED ORDER — VANCOMYCIN HCL IN DEXTROSE 1-5 GM/200ML-% IV SOLN
1000.0000 mg | Freq: Once | INTRAVENOUS | Status: AC
  Administered 2022-02-27: 1000 mg via INTRAVENOUS
  Filled 2022-02-27: qty 200

## 2022-02-27 MED ORDER — SODIUM CHLORIDE 0.45 % IV SOLN: INTRAVENOUS | Status: DC

## 2022-02-27 MED ORDER — ONDANSETRON HCL 4 MG/2ML IJ SOLN
INTRAMUSCULAR | Status: DC | PRN
  Administered 2022-02-27: 4 mg via INTRAVENOUS

## 2022-02-27 MED ORDER — EPHEDRINE 5 MG/ML INJ
INTRAVENOUS | Status: AC
  Filled 2022-02-27: qty 5

## 2022-02-27 MED ORDER — CHLORHEXIDINE GLUCONATE 0.12 % MT SOLN
15.0000 mL | Freq: Once | OROMUCOSAL | Status: AC
  Administered 2022-02-27: 15 mL via OROMUCOSAL

## 2022-02-27 MED ORDER — FENTANYL CITRATE (PF) 100 MCG/2ML IJ SOLN
INTRAMUSCULAR | Status: DC | PRN
  Administered 2022-02-27: 25 ug via INTRAVENOUS
  Administered 2022-02-27: 50 ug via INTRAVENOUS
  Administered 2022-02-27: 25 ug via INTRAVENOUS
  Administered 2022-02-27 (×2): 50 ug via INTRAVENOUS

## 2022-02-27 MED ORDER — CHLORHEXIDINE GLUCONATE 4 % EX LIQD: Freq: Once | CUTANEOUS | Status: DC

## 2022-02-27 MED ORDER — LIDOCAINE HCL (PF) 1 % IJ SOLN
INTRAMUSCULAR | Status: AC
  Filled 2022-02-27: qty 30

## 2022-02-27 MED ORDER — OXYCODONE HCL 5 MG/5ML PO SOLN: 5.0000 mg | Freq: Once | ORAL | Status: DC | PRN

## 2022-02-27 MED ORDER — LIDOCAINE HCL 1 % IJ SOLN
INTRAMUSCULAR | Status: DC | PRN
  Administered 2022-02-27: 5 mL

## 2022-02-27 MED ORDER — INSULIN ASPART 100 UNIT/ML IJ SOLN
0.0000 [IU] | Freq: Every day | INTRAMUSCULAR | Status: DC
  Administered 2022-02-27: 2 [IU] via SUBCUTANEOUS

## 2022-02-27 MED ORDER — VANCOMYCIN HCL IN DEXTROSE 1-5 GM/200ML-% IV SOLN
1000.0000 mg | INTRAVENOUS | Status: AC
  Administered 2022-02-27 (×2): 1000 mg via INTRAVENOUS
  Filled 2022-02-27: qty 200

## 2022-02-27 MED ORDER — LACTATED RINGERS IV SOLN: INTRAVENOUS | Status: DC

## 2022-02-27 MED ORDER — SUGAMMADEX SODIUM 200 MG/2ML IV SOLN
INTRAVENOUS | Status: DC | PRN
  Administered 2022-02-27: 140 mg via INTRAVENOUS

## 2022-02-27 MED ORDER — BUPIVACAINE HCL (PF) 0.5 % IJ SOLN
INTRAMUSCULAR | Status: AC
  Filled 2022-02-27: qty 30

## 2022-02-27 MED ORDER — BUPIVACAINE HCL 0.5 % IJ SOLN
INTRAMUSCULAR | Status: DC | PRN
  Administered 2022-02-27: 5 mL

## 2022-02-27 SURGICAL SUPPLY — 53 items
ADH SKN CLS APL DERMABOND .7 (GAUZE/BANDAGES/DRESSINGS) ×1
APL PRP STRL LF DISP 70% ISPRP (MISCELLANEOUS) ×3
BAG DECANTER FOR FLEXI CONT (MISCELLANEOUS) ×1 IMPLANT
BAG URINE DRAIN 2000ML AR STRL (UROLOGICAL SUPPLIES) ×1 IMPLANT
BNDG GAUZE DERMACEA FLUFF 4 (GAUZE/BANDAGES/DRESSINGS) ×1 IMPLANT
BRIEF MESH DISP LRG (UNDERPADS AND DIAPERS) IMPLANT
CATH COUDE 5CC RIBBED (CATHETERS) ×1 IMPLANT
CATH RIBBED COUDE 5CC (CATHETERS) ×1
CHLORAPREP W/TINT 26 (MISCELLANEOUS) ×2 IMPLANT
COVER MAYO STAND STRL (DRAPES) ×1 IMPLANT
COVER SURGICAL LIGHT HANDLE (MISCELLANEOUS) ×1 IMPLANT
DERMABOND ADVANCED .7 DNX12 (GAUZE/BANDAGES/DRESSINGS) ×1 IMPLANT
DRAIN CHANNEL 10F 3/8 F FF (DRAIN) IMPLANT
DRAPE INCISE IOBAN 66X45 STRL (DRAPES) ×1 IMPLANT
DRAPE LAPAROTOMY T 98X78 PEDS (DRAPES) ×1 IMPLANT
DRSG TEGADERM 4X4.75 (GAUZE/BANDAGES/DRESSINGS) IMPLANT
ELECT REM PT RETURN 15FT ADLT (MISCELLANEOUS) ×1 IMPLANT
EVACUATOR SILICONE 100CC (DRAIN) ×1 IMPLANT
GLOVE BIO SURGEON STRL SZ7 (GLOVE) ×1 IMPLANT
GLOVE BIOGEL PI IND STRL 7.0 (GLOVE) IMPLANT
GLOVE BIOGEL PI IND STRL 7.5 (GLOVE) ×1 IMPLANT
GOWN STRL REUS W/ TWL LRG LVL3 (GOWN DISPOSABLE) ×1 IMPLANT
GOWN STRL REUS W/ TWL XL LVL3 (GOWN DISPOSABLE) IMPLANT
GOWN STRL REUS W/TWL LRG LVL3 (GOWN DISPOSABLE) ×2
GOWN STRL REUS W/TWL XL LVL3 (GOWN DISPOSABLE) ×2
HOLDER FOLEY CATH W/STRAP (MISCELLANEOUS) ×1 IMPLANT
IMPL RTE SNAPCONE CX 1.0 IMPLANT
IMPLANT RTE SNAPCONE CX 1.0 ×1 IMPLANT
JET LAVAGE IRRISEPT WOUND (IRRIGATION / IRRIGATOR) ×3
KIT ACCESSORY AMS 700 PUMP (Urological Implant) IMPLANT
KIT BASIN OR (CUSTOM PROCEDURE TRAY) ×1 IMPLANT
KIT TURNOVER KIT A (KITS) ×1 IMPLANT
LAVAGE JET IRRISEPT WOUND (IRRIGATION / IRRIGATOR) IMPLANT
NEEDLE HYPO 22GX1.5 SAFETY (NEEDLE) ×1 IMPLANT
NS IRRIG 1000ML POUR BTL (IV SOLUTION) ×1 IMPLANT
PACK GENERAL/GYN (CUSTOM PROCEDURE TRAY) ×1 IMPLANT
PLUG CATH AND CAP STER (CATHETERS) ×1 IMPLANT
PUMP PENILE AMS 700CX MS 12X21 (Erectile Restoration) IMPLANT
RESERVOIR FLAT IZ 100ML (Miscellaneous) IMPLANT
RETRACTOR DEEP SCROTAL PENILE (MISCELLANEOUS) IMPLANT
SET COLLECT BLD 21X.75 12 PB G (NEEDLE) IMPLANT
SUPPORT SCROTAL LG STRP (MISCELLANEOUS) ×1 IMPLANT
SURGILUBE 2OZ TUBE FLIPTOP (MISCELLANEOUS) IMPLANT
SUT ETHILON 3 0 PS 1 (SUTURE) IMPLANT
SUT MNCRL AB 4-0 PS2 18 (SUTURE) ×1 IMPLANT
SUT VIC AB 2-0 UR6 27 (SUTURE) ×4 IMPLANT
SUT VIC AB 3-0 SH 27 (SUTURE) ×2
SUT VIC AB 3-0 SH 27X BRD (SUTURE) ×2 IMPLANT
SYR 10ML LL (SYRINGE) ×2 IMPLANT
SYR 50ML LL SCALE MARK (SYRINGE) ×3 IMPLANT
SYR CONTROL 10ML LL (SYRINGE) IMPLANT
TOWEL OR 17X26 10 PK STRL BLUE (TOWEL DISPOSABLE) ×2 IMPLANT
WATER STERILE IRR 500ML POUR (IV SOLUTION) ×1 IMPLANT

## 2022-02-27 NOTE — Anesthesia Procedure Notes (Signed)
Procedure Name: Intubation Date/Time: 02/27/2022 10:18 AM  Performed by: Garrel Ridgel, CRNAPre-anesthesia Checklist: Patient identified, Emergency Drugs available, Suction available and Patient being monitored Patient Re-evaluated:Patient Re-evaluated prior to induction Oxygen Delivery Method: Circle system utilized Preoxygenation: Pre-oxygenation with 100% oxygen Induction Type: IV induction Ventilation: Mask ventilation without difficulty Laryngoscope Size: Mac and 4 Grade View: Grade I Tube type: Oral Tube size: 7.5 mm Number of attempts: 1 Airway Equipment and Method: Stylet Placement Confirmation: ETT inserted through vocal cords under direct vision, positive ETCO2 and breath sounds checked- equal and bilateral Secured at: 23 cm Tube secured with: Tape Dental Injury: Teeth and Oropharynx as per pre-operative assessment

## 2022-02-27 NOTE — Discharge Instructions (Signed)
Penile prosthesis postoperative instructions  Wound:  In most cases your incision will have absorbable sutures that will dissolve within the first 10-20 days. Some will fall out even earlier. Expect some redness as the sutures dissolved but this should occur only around the sutures. If there is generalized redness, especially with increasing pain or swelling, let us know. The scrotum and penis will very likely get "black and blue" as the blood in the tissues spread. Sometimes the whole scrotum will turn colors. The black and blue is followed by a yellow and brown color. In time, all the discoloration will go away. In some cases some firm swelling in the area of the testicle and pump may persist for up to 4-6 weeks after the surgery and is considered normal in most cases.  Drain:   You may be discharged home with a drain in place. If so, you will be taught how to empty it and should keep track of the output. Additionally, you should call the office to arrange for an appointment to have it removed after a few days.   Diet:  You may return to your normal diet within 24 hours following your surgery. You may note some mild nausea and possibly vomiting the first 6-8 hours following surgery. This is usually due to the side effects of anesthesia, and will disappear quite soon. I would suggest clear liquids and a very light meal the first evening following your surgery.  Activity:  Your physical activity should be restricted the first 48 hours. During that time you should remain relatively inactive, moving about only when necessary. During the first 3 weeks following surgery you should avoid lifting any heavy objects (anything greater than 15 pounds), and avoid strenuous exercise. If you work, ask us specifically about your restrictions, both for work and home. We will write a note to your employer if needed.  Avoid using your penis until your follow up visit with Dr Geral Tuch, which will typically be around  3-4 weeks following the surgery. Most people are able to start cycling their device after that appointment, and can have intercourse soon thereafter.   You should plan to wear a tight pair of jockey shorts or an athletic supporter for the first 4-5 days, even to sleep. This will keep the scrotum immobilized to some degree and keep the swelling down.The position of your penis will determine what is most comfortable but I strongly urge you to keep the penis in the "up" position (toward your head). You should continue to tuck "up" your penis when possible for the first 3 months following surgery.  Ice packs should be placed on and off over the scrotum for the first 48 hours. Frozen peas or corn in a ZipLock bag can be frozen, used and re-frozen. Fifteen minutes on and 15 minutes off is a reasonable schedule. The ice is a good pain reliever and keeps the swelling down.  Hygiene:  You may shower 48 hours after your surgery. Tub bathing should be restricted until the wound is completely healed, typically around 2-3 weeks.  Medication:  You will be sent home with some type of pain medication. In many cases you will be sent home with a strong anti-inflammatory medication (Celebrex, Meloxicam) and a narcotic pain pill (hydrocodone or oxycodone). You can also supplement these medications with tylenol (acetaminophen). If the pain medication you are sent home with does not control the pain, please notify the office Problems you should report to us:  Fever of 101.0 degrees   Fahrenheit or greater. Moderate or severe swelling under the skin incision or involving the scrotum. Drug reaction such as hives, a rash, nausea or vomiting.  

## 2022-02-27 NOTE — Transfer of Care (Signed)
Immediate Anesthesia Transfer of Care Note  Patient: David Maynard  Procedure(s) Performed: INSERTION OF INFLATABLE PENILE IMPLANT  Patient Location: PACU  Anesthesia Type:General  Level of Consciousness: oriented, drowsy, and patient cooperative  Airway & Oxygen Therapy: Patient Spontanous Breathing and Patient connected to face mask oxygen  Post-op Assessment: Report given to RN and Post -op Vital signs reviewed and stable  Post vital signs: Reviewed and stable  Last Vitals:  Vitals Value Taken Time  BP 162/85 02/27/22 1208  Temp    Pulse 81 02/27/22 1212  Resp 16 02/27/22 1212  SpO2 100 % 02/27/22 1212  Vitals shown include unvalidated device data.  Last Pain:  Vitals:   02/27/22 0819  TempSrc:   PainSc: 0-No pain      Patients Stated Pain Goal: 4 (18/29/93 7169)  Complications: No notable events documented.

## 2022-02-27 NOTE — Anesthesia Preprocedure Evaluation (Signed)
Anesthesia Evaluation  Patient identified by MRN, date of birth, ID band Patient awake    Reviewed: Allergy & Precautions, H&P , NPO status , Patient's Chart, lab work & pertinent test results  Airway Mallampati: II   Neck ROM: full    Dental   Pulmonary neg pulmonary ROS   breath sounds clear to auscultation       Cardiovascular + CAD   Rhythm:regular Rate:Normal     Neuro/Psych  PSYCHIATRIC DISORDERS  Depression       GI/Hepatic ,GERD  ,,  Endo/Other  diabetes, Type 2Hypothyroidism    Renal/GU      Musculoskeletal  (+) Arthritis ,    Abdominal   Peds  Hematology   Anesthesia Other Findings   Reproductive/Obstetrics                             Anesthesia Physical Anesthesia Plan  ASA: 3  Anesthesia Plan: General   Post-op Pain Management:    Induction: Intravenous  PONV Risk Score and Plan: 2 and Ondansetron, Dexamethasone, Midazolam and Treatment may vary due to age or medical condition  Airway Management Planned: LMA  Additional Equipment:   Intra-op Plan:   Post-operative Plan: Extubation in OR  Informed Consent: I have reviewed the patients History and Physical, chart, labs and discussed the procedure including the risks, benefits and alternatives for the proposed anesthesia with the patient or authorized representative who has indicated his/her understanding and acceptance.     Dental advisory given  Plan Discussed with: CRNA, Anesthesiologist and Surgeon  Anesthesia Plan Comments:        Anesthesia Quick Evaluation

## 2022-02-27 NOTE — Anesthesia Postprocedure Evaluation (Signed)
Anesthesia Post Note  Patient: David Maynard  Procedure(s) Performed: INSERTION OF INFLATABLE PENILE IMPLANT     Patient location during evaluation: PACU Anesthesia Type: General Level of consciousness: awake and alert Pain management: pain level controlled Vital Signs Assessment: post-procedure vital signs reviewed and stable Respiratory status: spontaneous breathing, nonlabored ventilation, respiratory function stable and patient connected to nasal cannula oxygen Cardiovascular status: blood pressure returned to baseline and stable Postop Assessment: no apparent nausea or vomiting Anesthetic complications: no   No notable events documented.  Last Vitals:  Vitals:   02/27/22 1300 02/27/22 1324  BP: (!) 159/87 (!) 176/96  Pulse: 73 80  Resp: 11 16  Temp: (!) 36.3 C (!) 36.3 C  SpO2: 100% 97%    Last Pain:  Vitals:   02/27/22 1324  TempSrc: Oral  PainSc:                  David Maynard S

## 2022-02-27 NOTE — H&P (Signed)
H&P  History of Present Illness: David Maynard is a 72 y.o. year old M who presents today for placement of inflatable penile prosthesis. 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   and left   Thyroid disease    Unspecified hypothyroidism     Past Surgical History:  Procedure Laterality Date   COLONOSCOPY     COLONOSCOPY WITH PROPOFOL  02/28/2021   ESOPHAGOGASTRODUODENOSCOPY  06/16/2003   EYE SURGERY     INGUINAL HERNIA REPAIR     left   JOINT REPLACEMENT     KNEE ARTHROSCOPY     Left   KNEE ARTHROSCOPY W/ ACL RECONSTRUCTION     left   ROTATOR CUFF REPAIR     Right x2   Stress Cardiolite  07/15/2000   TOTAL KNEE ARTHROPLASTY Right 11/04/2017   TOTAL KNEE ARTHROPLASTY Right 11/04/2017   Procedure: TOTAL KNEE ARTHROPLASTY;  Surgeon: Elsie Saas, MD;  Location: [facility];  Service: Orthopedics;  Laterality: Right;   TOTAL KNEE ARTHROPLASTY Left 12/23/2017   TOTAL KNEE ARTHROPLASTY Left 12/23/2017   Procedure: TOTAL KNEE ARTHROPLASTY;  Surgeon: Elsie Saas, MD;  Location: [facility];  Service: Orthopedics;  Laterality: Left;   VASECTOMY  05/2002    Home Medications:  Current Meds  Medication Sig   atorvastatin (LIPITOR) 40 MG tablet TAKE 1 TABLET BY MOUTH  DAILY   colesevelam (WELCHOL) 625 MG tablet Take 4 tablets (2,500 mg total) by mouth daily. (Patient taking differently: Take 1,250 mg by mouth 2 (two) times daily with a meal.)   cyanocobalamin (DODEX) 1000  MCG/ML injection Inject 1 mL (1,000 mcg total) into the muscle every 30 (thirty) days.   docusate sodium (COLACE) 100 MG capsule Take 100 mg by mouth 2 (two) times daily. Takes daily   insulin lispro (HUMALOG) 100 UNIT/ML injection For use in pump, total of 50 units per day.   levothyroxine (SYNTHROID) 75 MCG tablet Take 1 tablet (75 mcg total) by mouth daily.   MYRBETRIQ 50 MG TB24 tablet Take 50 mg by mouth daily.   OZEMPIC, 0.25 OR 0.5 MG/DOSE, 2 MG/3ML SOPN Inject 2 mg into the skin once a week.   tadalafil (ADCIRCA/CIALIS) 20 MG tablet Take 20 mg by mouth daily as needed for erectile dysfunction.   Trospium Chloride 60 MG CP24 Take 60 mg by mouth daily.   Turmeric 500 MG CAPS Take 500-1,000 mg by mouth daily. May take an extra dose if plays golf or tennis   [DISCONTINUED] metFORMIN (GLUCOPHAGE-XR) 500 MG 24 hr tablet Take 2 tablets (1,000 mg total) by mouth 2 (two) times daily. (Patient taking differently: Take 2,000 mg by mouth every evening.)    Allergies: No Known Allergies  Family History  Problem Relation Age of Onset   Hypertension Mother    Kidney disease Father    Breast cancer Neg Hx    Celiac disease Neg Hx  Cirrhosis Neg Hx    Clotting disorder Neg Hx    Colitis Neg Hx    Colon cancer Neg Hx    Colon polyps Neg Hx    Crohn's disease Neg Hx    Cystic fibrosis Neg Hx    Diabetes Neg Hx    Esophageal cancer Neg Hx    Heart disease Neg Hx    Hemochromatosis Neg Hx    Inflammatory bowel disease Neg Hx    Irritable bowel syndrome Neg Hx    Liver cancer Neg Hx    Liver disease Neg Hx    Ovarian cancer Neg Hx    Pancreatic cancer Neg Hx    Prostate cancer Neg Hx    Rectal cancer Neg Hx    Stomach cancer Neg Hx    Ulcerative colitis Neg Hx    Uterine cancer Neg Hx    Wilson's disease Neg Hx     Social History:  reports that he has never smoked. He has never used smokeless tobacco. He reports current alcohol use of about 1.0 standard drink of alcohol per week. He  reports that he does not use drugs.  ROS: A complete review of systems was performed.  All systems are negative except for pertinent findings as noted.  Physical Exam:  Vital signs in last 24 hours: Pulse Rate:  [86] 86 (02/05 1351) BP: (120)/(72) 120/72 (02/05 1351) SpO2:  [97 %] 97 % (02/05 1351) Weight:  [64.2 kg] 64.2 kg (02/05 1351) Constitutional:  Alert and oriented, No acute distress Cardiovascular: Regular rate and rhythm Respiratory: Normal respiratory effort, Lungs clear bilaterally GI: Abdomen is soft, nontender, nondistended, no abdominal masses Lymphatic: No lymphadenopathy Neurologic: Grossly intact, no focal deficits Psychiatric: Normal mood and affect   Laboratory Data:  No results for input(s): "WBC", "HGB", "HCT", "PLT" in the last 72 hours.  No results for input(s): "NA", "K", "CL", "GLUCOSE", "BUN", "CALCIUM", "CREATININE" in the last 72 hours.  Invalid input(s): "CO3"   Results for orders placed or performed in visit on 02/26/22 (from the past 24 hour(s))  POCT glycosylated hemoglobin (Hb A1C)     Status: Abnormal   Collection Time: 02/26/22  1:59 PM  Result Value Ref Range   Hemoglobin A1C 6.0 (A) 4.0 - 5.6 %   HbA1c POC (<> result, manual entry)     HbA1c, POC (prediabetic range)     HbA1c, POC (controlled diabetic range)     Recent Results (from the past 240 hour(s))  Urine Culture     Status: None   Collection Time: 02/21/22 10:58 AM   Specimen: Urine, Clean Catch  Result Value Ref Range Status   Specimen Description   Final    URINE, CLEAN CATCH Performed at [facility], [place] [address] [place], [place] [address]    Special Requests   Final    NONE Performed at [facility], [place] [address], [place], [place] [address]    Culture   Final    NO GROWTH Performed at [facility], [place] [address], [place], [place] [address]    Report Status 02/22/2022 FINAL  Final    Renal Function: Recent Labs     02/21/22 1058  CREATININE 0.88   Estimated Creatinine Clearance: 69.9 mL/min (by C-G formula based on SCr of 0.88 mg/dL).  Radiologic Imaging: No results found.  Assessment:  David Maynard is a 72 y.o. year old with ED refractory to medical treatment.   Plan:  To  OR as planned. Procedure and risks reviewed (bleeding, infection, implant infection, implant malfunction, malplacement, damage to adjacent structures, pain, loss of erection length)   Donald Pore, MD 02/27/2022, 7:33 AM  [facility] Pager: [phone]

## 2022-02-27 NOTE — Op Note (Signed)
PATIENT:  David Maynard  PRE-OPERATIVE DIAGNOSIS:  Organic erectile dysfunction  POST-OPERATIVE DIAGNOSIS:  Same  PROCEDURE:  Procedure(s): 3 piece inflatable penile prosthesis (BS/AMS)  SURGEON:  Donald Pore MD  ASST: Hinton Rao, MD  INDICATION: He has had long-standing organic erectile dysfunction and refractory to other modes of treatment. He has elected to proceed with prosthesis implantation.  ANESTHESIA:  General  EBL:  Minimal  Device: 3 piece AMS CX 700: 1003 cc reservoir, 21 cm cylinders and 1 cm rear-tip extenders on right and left sides  LOCAL MEDICATIONS USED:  None  SPECIMEN: None  DISPOSITION OF SPECIMEN:  N/A  Description of procedure: The patient was taken to the operating room, placed on the table and administered general anesthesia in the supine position. His genitalia was then prepped with chlorhexidine x 2. He was draped in the usual sterile fashion, and I used [place] on the field. An official timeout was then performed.  A 14 French coude catheter was then placed in the bladder and the bladder was drained and the catheter was plugged. A midline penoscrotal incision was then made and the dissection was carried down to the corpora and urethra. The lonestar retractor was positioned so as to have excellent exposure. 2-0 Vicryl sutures were then placed proximally in each corpus cavernosum to serve as stay sutures. An incision was then made in the corpus cavernosum first on the left-hand side with the bovie. [place] were used to gently dilate the opening. I then dilated the corpus cavernosum with the a 12 Fr brooks dilator distally and proximally. Field goal post tests were performed and there was no evidence of perforations or crossover. I then irrigated the corpus cavernosum with antibiotic solution and measured the distance proximally and distally from the stay suture and was found to be 9 and 13 cm, respectively.I then turned my attention to the contralateral corpus  cavernosum and placed my stay sutures, made my corporotomy and dilated the corpus cavernosum in an identical fashion. This was measured and also was found to be 8.5 cm proximally and 13.5 distally. It was irrigated with anastomotic solution as was the scrotum. I then chose an 21 cm cylinder set with 1 cm rear-tip extenders and these were prepped while I prepared the site for reservoir placement.  I then digitally probed into the Left external inguinal ring. The patient had previously had an inguinal hernia repair on this side, so I was unable to easily poke into the space of retzius. Subsequently, I directed my finger cephalad and poked through the conjoint tendon into an ectopic space. A sponge stick was used to develop this space cephalad. I irrigated the space with antibiotic solution and then placed the reservoir in this location. I then filled the reservoir with 100 cc of sterile saline, and checked to confirm proper position. There was minimal backpressure with the reservoir max-filled.  Attention was redirected to the corporotomies where the cylinders were then placed by first fixing the suture to the distal aspect of the right cylinder to a straight needle. This was then loaded on the [facility] inserter and passed through the corporotomy and distally. I then advanced the straight needle with the [place] inserter out through the glans and this was grasped with a hemostat and pulled through the glans and the suture was secured with a hemostat. I then performed an identical maneuver on the contralateral side. After this was performed I irrigated both corpus cavernosum; there was no evidence of urethral perforation. I inserted  the distal portion of the cylinder through the corporotomies and pulled this to the end of the corpora with the suture. The proximal aspect with the rear-tip extender was then passed through the corporotomy and into the seated position on each side. I then connected reservoir tubing to a  syringe filled with sterile saline and inflated the device. I noted a good straight erection with both cylinders equidistant under the glans and no buckling of the cylinders. I therefore deflated the device and closed the corporotomies with used my previously placed stay sutures.   I then grasped the scrotal skin in the midline with a babcock, and used a hemostat to dissect down to the dependent-most portion of the scrotum. The nasal speculum was inserted into this space, and facilitated placement of the pump. The cylinder was then connected to the pump after excising the excess tubing with appropriate shodded hemostats in place and then I used the supplied connectors to make the connection. I then again cycled the device with the pump and it cycled properly. I deflated the device and pumped it up about three quarters of the way to aid with hemostasis. I irrigated the wound one last time with antibiotic irrigation and then closed the deep scrotal tissue over the tubing and pump with running 3-0 monocryl suture. I placed a 10 Fr blake drain over the corporotomies. A second layer was then closed over this first layer with running 3- 0 monocryl, and running skin suture w/ 4-0 monocryl performed. Incision dressed with dermabond.  A mummy wrap was applied. The catheter was connected to closed system drainage, and drain connected to suction bulb and the patient was awakened and taken recovery room in stable and satisfactory condition. He tolerated the procedure well and there were no intraoperative complications. Needle sponge and instrument counts were correct at the end of the operation.

## 2022-02-28 DIAGNOSIS — N529 Male erectile dysfunction, unspecified: Secondary | ICD-10-CM | POA: Diagnosis not present

## 2022-02-28 LAB — GLUCOSE, CAPILLARY: Glucose-Capillary: 141 mg/dL — ABNORMAL HIGH (ref 70–99)

## 2022-02-28 MED ORDER — ACETAMINOPHEN 500 MG PO TABS: 1000.0000 mg | ORAL_TABLET | Freq: Four times a day (QID) | ORAL | 0 refills | Status: DC

## 2022-02-28 MED ORDER — CELECOXIB 200 MG PO CAPS: 200.0000 mg | ORAL_CAPSULE | Freq: Two times a day (BID) | ORAL | 0 refills | Status: DC

## 2022-02-28 MED ORDER — OXYCODONE HCL 5 MG PO TABS: 5.0000 mg | ORAL_TABLET | Freq: Four times a day (QID) | ORAL | 0 refills | Status: DC | PRN

## 2022-02-28 MED ORDER — SULFAMETHOXAZOLE-TRIMETHOPRIM 800-160 MG PO TABS: 1.0000 | ORAL_TABLET | Freq: Two times a day (BID) | ORAL | 0 refills | Status: DC

## 2022-02-28 NOTE — Discharge Summary (Addendum)
[facility] Discharge Summary  Admit date: 02/27/2022  Discharge date and time: 02/28/22   Discharge to: Home  Discharge Service: Urology  Discharge Attending Physician:  Dr. Cain Sieve  Discharge  Diagnoses: S/P insertion of penile implant  Secondary Diagnosis: Principal Problem:   S/P insertion of penile implant   OR Procedures: Procedure(s): INSERTION OF INFLATABLE PENILE IMPLANT 02/27/2022   Ancillary Procedures: None   Discharge Day Services: The patient was seen and examined by the Urology team both in the morning and immediately prior to discharge.  Vital signs and laboratory values were stable and within normal limits.  The physical exam was benign and unchanged and all surgical wounds were examined.  Discharge instructions were explained and all questions answered.  Subjective  No acute events overnight. Pain Controlled. No fever or chills.  Objective Patient Vitals for the past 8 hrs:  BP Temp Temp src Pulse Resp SpO2  02/28/22 0439 113/70 97.8 F (36.6 C) Oral 76 16 97 %  02/28/22 0124 114/65 97.9 F (36.6 C) Oral 79 18 96 %  02/28/22 0106 115/64 97.8 F (36.6 C) Oral 77 16 96 %   No intake/output data recorded.  General Appearance:        No acute distress Lungs:                       Normal work of breathing on room air Heart:                                Regular rate and rhythm Abdomen:                         Soft, non-tender, non-distended GU:        Appropriate ecchymosis of scrotum/base of penis. Foley draining clear yellow. Incision well appearing Extremities:                      Warm and well perfused   Hospital Course:  The patient underwent penile prosthesis placement on 02/27/2022.  The patient tolerated the procedure well, was extubated in the OR, and afterwards was taken to the PACU for routine post-surgical care. When stable the patient was transferred to the floor.   The patient did well postoperatively.  The patient's diet was slowly  advanced and at the time of discharge was tolerating a regular diet.  Drain removed.The patient was discharged home 1 Day Post-Op, at which point was tolerating a regular solid diet, was able to void spontaneously, have adequate pain control with P.O. pain medication, and could ambulate without difficulty. The patient will follow up with [place] for post op check.   Condition at Discharge: Improved  Discharge Medications:  Allergies as of 02/28/2022   No Known Allergies      Medication List     TAKE these medications    Accu-Chek Guide Me w/Device Kit USE AS DIRECTED   Accu-Chek Guide test strip Generic drug: glucose blood USE TO CHECK BLOOD SUGAR 6  TIMES DAILY   ascorbic acid 500 MG tablet Commonly known as: VITAMIN C Take 1 tablet (500 mg total) by mouth daily.   aspirin EC 81 MG tablet Take 81 mg by mouth daily. Swallow whole.   atorvastatin 40 MG tablet Commonly known as: LIPITOR TAKE 1 TABLET BY MOUTH  DAILY   B-D 3CC LUER-LOK SYR 25GX1" 25G X 1"  3 ML Misc Generic drug: SYRINGE-NEEDLE (DISP) 3 ML USE AS DIRECTED MONTHLY FOR B-12 INJECTIONS   CALCIUM 600 PO Take 1 tablet by mouth daily.   Cinnamon 500 MG capsule Take 3,000 mg by mouth 2 (two) times daily.   clomiPHENE 50 MG tablet Commonly known as: CLOMID TAKE ONE-HALF TABLET BY  MOUTH DAILY   colesevelam 625 MG tablet Commonly known as: WELCHOL Take 4 tablets (2,500 mg total) by mouth daily. What changed:  how much to take when to take this   cyanocobalamin 1000 MCG/ML injection Commonly known as: Dodex Inject 1 mL (1,000 mcg total) into the muscle every 30 (thirty) days.   Dexcom G6 Sensor Misc 1 Device by Does not apply route See admin instructions. 1 every 10 days   docusate sodium 100 MG capsule Commonly known as: COLACE Take 100 mg by mouth 2 (two) times daily. Takes daily   Glucagon 3 MG/DOSE Powd Place 3 mg into the nose once as needed for up to 1 dose.   GLUCOSAMINE CHONDR 1500 COMPLX  PO Take 2 tablets by mouth daily.   insulin lispro 100 UNIT/ML injection Commonly known as: HumaLOG For use in pump, total of 50 units per day.   Insulin Pen Needle 31G X 5 MM Misc 1 Device by Does not apply route in the morning and at bedtime.   levothyroxine 75 MCG tablet Commonly known as: SYNTHROID Take 1 tablet (75 mcg total) by mouth daily.   metFORMIN 500 MG 24 hr tablet Commonly known as: GLUCOPHAGE-XR Take 2 tablets (1,000 mg total) by mouth 2 (two) times daily.   multivitamin tablet Take 1 tablet by mouth daily.   Myrbetriq 50 MG Tb24 tablet Generic drug: mirabegron ER Take 50 mg by mouth daily.   omega-3 acid ethyl esters 1 g capsule Commonly known as: LOVAZA TAKE 2 CAPSULES BY MOUTH  TWICE DAILY   Omnipod 5 G6 Pod (Gen 5) Misc Change pod every 72 hours.   Ozempic (0.25 or 0.5 MG/DOSE) 2 MG/1.5ML Sopn Generic drug: Semaglutide(0.25 or 0.5MG /DOS) Inject 0.5 mg into the skin once a week.   Ozempic (0.25 or 0.5 MG/DOSE) 2 MG/3ML Sopn Generic drug: Semaglutide(0.25 or 0.5MG /DOS) Inject 2 mg into the skin once a week.   tadalafil 20 MG tablet Commonly known as: CIALIS Take 20 mg by mouth daily as needed for erectile dysfunction.   Trospium Chloride 60 MG Cp24 Take 60 mg by mouth daily.   Turmeric 500 MG Caps Take 500-1,000 mg by mouth daily. May take an extra dose if plays golf or tennis   VITAMIN D (CHOLECALCIFEROL) PO Take 1 tablet by mouth daily.   vitamin E 200 UNIT capsule Take 200 Units by mouth daily.

## 2022-02-28 NOTE — Progress Notes (Signed)
.   Transition of Care [facility]) Screening Note   Patient Details  Name: David Maynard Date of Birth: 11/28/1950   Transition of Care [facility]) CM/SW Contact:    Illene Regulus, LCSW Phone Number: 02/28/2022, 9:18 AM    Transition of Care Department [facility]) has reviewed patient and no TOC needs have been identified at this time. We will continue to monitor patient advancement through interdisciplinary progression rounds. If new patient transition needs arise, please place a TOC consult.

## 2022-03-01 ENCOUNTER — Telehealth: Payer: Self-pay

## 2022-03-01 ENCOUNTER — Encounter (HOSPITAL_COMMUNITY): Payer: Self-pay | Admitting: Urology

## 2022-03-01 NOTE — Telephone Encounter (Signed)
Transition Care Management Follow-up Telephone Call Date of discharge and from where: [place] 02-28-22 Dx: S/P insertion of penile implant How have you been since you were released from the hospital? Doing good- no pain at this time  Any questions or concerns? No  Items Reviewed: Did the pt receive and understand the discharge instructions provided? Yes  Medications obtained and verified? Yes  Other? No  Any new allergies since your discharge? No  Dietary orders reviewed? Yes Do you have support at home? Yes   Home Care and Equipment/Supplies: Were home health services ordered? no If so, what is the name of the agency? na  Has the agency set up a time to come to the patient's home? not applicable Were any new equipment or medical supplies ordered?  No What is the name of the medical supply agency? na Were you able to get the supplies/equipment? not applicable Do you have any questions related to the use of the equipment or supplies? No  Functional Questionnaire: (I = Independent and D = Dependent) ADLs: I  Bathing/Dressing- I  Meal Prep- I  Eating- I  Maintaining continence- I  Transferring/Ambulation- I  Managing Meds- I  Follow up appointments reviewed:  PCP Hospital f/u appt confirmed? No  . [facility] f/u appt confirmed? Yes  Scheduled to see Dr Cain Sieve on 03-26-22 @ 1pm. Are transportation arrangements needed? No  If their condition worsens, is the pt aware to call PCP or go to the Emergency Dept.? Yes Was the patient provided with contact information for the PCP's office or ED? Yes Was to pt encouraged to call back with questions or concerns? Yes   Juanda Crumble LPN [place] Direct Dial [phone]

## 2022-03-05 ENCOUNTER — Encounter: Payer: Self-pay | Admitting: Internal Medicine

## 2022-03-05 ENCOUNTER — Ambulatory Visit (INDEPENDENT_AMBULATORY_CARE_PROVIDER_SITE_OTHER): Payer: Medicare Other | Admitting: Internal Medicine

## 2022-03-05 VITALS — BP 122/68 | HR 81 | Ht 68.0 in | Wt 141.0 lb

## 2022-03-05 DIAGNOSIS — E039 Hypothyroidism, unspecified: Secondary | ICD-10-CM | POA: Diagnosis not present

## 2022-03-05 DIAGNOSIS — E1169 Type 2 diabetes mellitus with other specified complication: Secondary | ICD-10-CM | POA: Diagnosis not present

## 2022-03-05 DIAGNOSIS — E119 Type 2 diabetes mellitus without complications: Secondary | ICD-10-CM

## 2022-03-05 DIAGNOSIS — E785 Hyperlipidemia, unspecified: Secondary | ICD-10-CM

## 2022-03-05 NOTE — Telephone Encounter (Signed)
Patient is calling to say that his reading got down as low as 40 for his glucose last night.  Patient states that he did eat a candy bar, but is very concerned about his low readings.  Per patient the low reading have occurred since his surgery last week.

## 2022-03-05 NOTE — Progress Notes (Signed)
Patient ID: BRADLEY LEES, male   DOB: Sep 24, 1950, 72 y.o.   MRN: [id number]  HPI: ARJENIS INDOVINA is a 72 y.o.-year-old male, returning for follow-up for DM2, diagnosed in 2008, on insulin since 2021, controlled, with complications (CAD).  He previously saw Dr. Loanne Drilling, last visit 1 week ago. He called and scheduled this appointment after he had a low blood sugar at night.  He called and asked to be seen due to low blood sugar episodes at night.  Interim history: No increased urination, blurry vision, nausea, chest pain.  Reviewed HbA1c levels: Lab Results  Component Value Date   HGBA1C 6.0 (A) 02/26/2022   HGBA1C 6.0 (A) 01/02/2022   HGBA1C 5.7 (A) 09/11/2021   Insulin pump:  -Omnipod 5  CGM: -Dexcom G6  Insulin: -Humalog  Supplies: -Dexcom from [employer] -Omnipod from [employer]  Pump settings: - basal rates: 12 am: 0.3 units/h >> 0.25 units/h 5 am: 0.45 7 am: 0.3 - ICR: 1:6 - target: 110 - ISF: 1:40 - Active insulin time: 4h - extended bolusing: not using - changes infusion site: q3 days TDD from basal insulin: 8.6 >> 10.3 units (28%) >> 35% >> 39% TDD from bolus insulin: 22.7 >> 26.4 units (72%) >> 65% >> 61% Total daily dose: 31-50 units /day  He is also on: -  >> stopped 08/2021 - Victoza 1.8 mg in a.m. >> Ozempic 0.5 mg weekly - Metformin ER 1000 mg 2x a day >> 2000 mg with dinner  Meter: Accu-Chek guide  Pt checks his sugars more than 4 times a day:   Prev.:   Previously:   Lowest sugar was 54 >> 50s; he has hypoglycemia awareness at 60.  He does not have a glucagon kit at home.  His wife is an Therapist, sports and NP. Highest sugar was 200s >> 206 >> 254.  - no CKD, last BUN/creatinine:  Lab Results  Component Value Date   BUN 20 02/21/2022   BUN 23 03/13/2021   CREATININE 0.88 02/21/2022   CREATININE 0.96 03/13/2021   - + HL; last set of lipids: Lab Results  Component Value Date   CHOL 131 03/13/2021   HDL 85.10 03/13/2021   LDLCALC 35 03/13/2021    TRIG 50.0 03/13/2021   CHOLHDL 2 03/13/2021  On Lipitor 40 mg daily, Welchol 650 x 2 tablets twice a day, omega-3 fatty acids 2000 mg twice a day.  - last eye exam was 11/27/2021. No DR. Cataract sx 2021.  His vision improved significantly afterwards.  - no numbness and tingling in his feet.  Last foot exam 02/26/2022.  On ASA 81.  Last TSH: Lab Results  Component Value Date   TSH 5.04 03/13/2021   He is on Levothyroxine 75 mcg daily.  He has family history of type 1 diabetes - son.  He also has a history of ADD, B12 deficiency, BPH, GERD, hypogonadism, osteoarthritis, depression, anemia..  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   and left   Thyroid disease    Unspecified hypothyroidism    Past Surgical History:  Procedure Laterality Date   COLONOSCOPY     COLONOSCOPY WITH PROPOFOL  02/28/2021   ESOPHAGOGASTRODUODENOSCOPY  06/16/2003   EYE SURGERY     INGUINAL HERNIA REPAIR     left   JOINT REPLACEMENT     KNEE ARTHROSCOPY     Left   KNEE ARTHROSCOPY W/ ACL RECONSTRUCTION     left   PENILE PROSTHESIS IMPLANT N/A 02/27/2022   Procedure: INSERTION OF INFLATABLE PENILE IMPLANT;  Surgeon: Vira Agar, MD;  Location: [facility];  Service: Urology;  Laterality: N/A;  90 MINUTES NEEDED FOR CASE   ROTATOR CUFF REPAIR     Right x2   Stress Cardiolite  07/15/2000   TOTAL KNEE ARTHROPLASTY Right 11/04/2017   TOTAL KNEE ARTHROPLASTY Right 11/04/2017   Procedure:  TOTAL KNEE ARTHROPLASTY;  Surgeon: Elsie Saas, MD;  Location: [facility];  Service: Orthopedics;  Laterality: Right;   TOTAL KNEE ARTHROPLASTY Left 12/23/2017   TOTAL KNEE ARTHROPLASTY Left 12/23/2017   Procedure: TOTAL KNEE ARTHROPLASTY;  Surgeon: Elsie Saas, MD;  Location: [facility];  Service: Orthopedics;  Laterality: Left;   VASECTOMY  05/2002   Social History   Socioeconomic History   Marital status: Married    Spouse name: Not on file   Number of children: 2   Years of education: Not on file   Highest education level: Not on file  Occupational History   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  Tobacco Use   Smoking status: Never   Smokeless tobacco: Never  Vaping Use   Vaping Use: Never used  Substance and Sexual Activity   Alcohol use: Yes    Alcohol/week: 1.0 standard drink of alcohol    Types: 1 Cans of beer per week    Comment: weekly   Drug use: No   Sexual activity: Not on file  Other Topics Concern   Not on file  Social History Narrative   Not on file   Social Determinants of Health   Financial Resource Strain: Low Risk  (10/09/2021)   Overall Financial Resource Strain (CARDIA)    Difficulty of Paying Living Expenses: Not hard at all  Food Insecurity: No Food Insecurity (02/27/2022)   Hunger Vital Sign    Worried About Running Out of Food in the Last Year: Never true    Ran Out of Food in the Last Year: Never true  Transportation Needs: No Transportation Needs (02/27/2022)   PRAPARE - Hydrologist (Medical): No    Lack of Transportation (Non-Medical): No  Physical Activity: Sufficiently Active (10/09/2021)   Exercise Vital Sign    Days of Exercise per Week: 5 days    Minutes of Exercise per Session: 120 min  Stress: No Stress Concern Present (10/09/2021)   [place]    Feeling of Stress : Not at all  Social Connections: [place] (10/09/2021)    Social Connection and Isolation Panel [NHANES]    Frequency of Communication with Friends and Family: More than three times a week    Frequency of Social Gatherings with Friends and Family: More than three times a week    Attends Religious Services: More than 4 times per year    Active Member of Clubs or Organizations: Yes    Attends  Club or Organization Meetings: 1 to 4 times per year    Marital Status: Married  Human resources officer Violence: Not At Risk (02/27/2022)   Humiliation, Afraid, Rape, and Kick questionnaire    Fear of Current or Ex-Partner: No    Emotionally Abused: No    Physically Abused: No    Sexually Abused: No   Current Outpatient Medications on File Prior to Visit  Medication Sig Dispense Refill   ACCU-CHEK GUIDE test strip USE TO CHECK BLOOD SUGAR 6  TIMES DAILY 600 strip 3   acetaminophen (TYLENOL) 500 MG tablet Take 2 tablets (1,000 mg total) by mouth every 6 (six) hours. 30 tablet 0   ascorbic acid (VITAMIN C) 500 MG tablet Take 1 tablet (500 mg total) by mouth daily. 20 tablet 0   aspirin EC 81 MG tablet Take 81 mg by mouth daily. Swallow whole. (Patient not taking: Reported on 03/01/2022)     atorvastatin (LIPITOR) 40 MG tablet TAKE 1 TABLET BY MOUTH  DAILY 90 tablet 3   B-D 3CC LUER-LOK SYR 25GX1" 25G X 1" 3 ML MISC USE AS DIRECTED MONTHLY FOR B-12 INJECTIONS 3 each 0   Blood Glucose Monitoring Suppl (ACCU-CHEK GUIDE ME) w/Device KIT USE AS DIRECTED 1 kit 0   Calcium Carbonate (CALCIUM 600 PO) Take 1 tablet by mouth daily. (Patient not taking: Reported on 03/01/2022)     celecoxib (CELEBREX) 200 MG capsule Take 1 capsule (200 mg total) by mouth 2 (two) times daily. (Patient not taking: Reported on 03/01/2022) 28 capsule 0   Cinnamon 500 MG capsule Take 3,000 mg by mouth 2 (two) times daily. (Patient not taking: Reported on 03/01/2022)     clomiPHENE (CLOMID) 50 MG tablet TAKE ONE-HALF TABLET BY  MOUTH DAILY (Patient not taking: Reported on 02/20/2022) 45 tablet 3   colesevelam  (WELCHOL) 625 MG tablet Take 4 tablets (2,500 mg total) by mouth daily. (Patient taking differently: Take 1,250 mg by mouth 2 (two) times daily with a meal.) 360 tablet 3   Continuous Blood Gluc Sensor (DEXCOM G6 SENSOR) MISC 1 Device by Does not apply route See admin instructions. 1 every 10 days 9 each 3   cyanocobalamin (DODEX) 1000 MCG/ML injection Inject 1 mL (1,000 mcg total) into the muscle every 30 (thirty) days. 3 mL 0   docusate sodium (COLACE) 100 MG capsule Take 100 mg by mouth 2 (two) times daily. Takes daily     Glucagon 3 MG/DOSE POWD Place 3 mg into the nose once as needed for up to 1 dose. 1 each 11   Glucosamine-Chondroit-Vit C-Mn (GLUCOSAMINE CHONDR 1500 COMPLX PO) Take 2 tablets by mouth daily. (Patient not taking: Reported on 03/01/2022)     Insulin Disposable Pump (OMNIPOD 5 G6 POD, GEN 5,) MISC Change pod every 72 hours. 10 each 10   insulin lispro (HUMALOG) 100 UNIT/ML injection For use in pump, total of 50 units per day. 50 mL 3   Insulin Pen Needle 31G X 5 MM MISC 1 Device by Does not apply route in the morning and at bedtime. 200 each 3   levothyroxine (SYNTHROID) 75 MCG tablet Take 1 tablet (75 mcg total) by mouth daily. 90 tablet 3   metFORMIN (GLUCOPHAGE-XR) 500 MG 24 hr tablet Take 2 tablets (1,000 mg total) by mouth 2 (two) times daily. 360 tablet 1   Multiple Vitamin (MULTIVITAMIN) tablet Take 1 tablet by mouth daily.     MYRBETRIQ 50 MG TB24 tablet Take 50 mg by mouth daily.  omega-3 acid ethyl esters (LOVAZA) 1 g capsule TAKE 2 CAPSULES BY MOUTH  TWICE DAILY 360 capsule 3   oxyCODONE (OXY IR/ROXICODONE) 5 MG immediate release tablet Take 1 tablet (5 mg total) by mouth every 6 (six) hours as needed for severe pain. (Patient not taking: Reported on 03/01/2022) 16 tablet 0   OZEMPIC, 0.25 OR 0.5 MG/DOSE, 2 MG/3ML SOPN Inject 2 mg into the skin once a week.     Semaglutide,0.25 or 0.5MG/DOS, (OZEMPIC, 0.25 OR 0.5 MG/DOSE,) 2 MG/1.5ML SOPN Inject 0.5 mg into the skin once  a week. (Patient not taking: Reported on 02/20/2022) 4.5 mL 5   sulfamethoxazole-trimethoprim (BACTRIM DS) 800-160 MG tablet Take 1 tablet by mouth 2 (two) times daily. 14 tablet 0   tadalafil (ADCIRCA/CIALIS) 20 MG tablet Take 20 mg by mouth daily as needed for erectile dysfunction.     Trospium Chloride 60 MG CP24 Take 60 mg by mouth daily. (Patient not taking: Reported on 03/01/2022)     Turmeric 500 MG CAPS Take 500-1,000 mg by mouth daily. May take an extra dose if plays golf or tennis (Patient not taking: Reported on 03/01/2022)     VITAMIN D, CHOLECALCIFEROL, PO Take 1 tablet by mouth daily. (Patient not taking: Reported on 03/01/2022)     vitamin E 200 UNIT capsule Take 200 Units by mouth daily. (Patient not taking: Reported on 03/01/2022)     No current facility-administered medications on file prior to visit.   No Known Allergies Family History  Problem Relation Age of Onset   Hypertension Mother    Kidney disease Father    Breast cancer Neg Hx    Celiac disease Neg Hx    Cirrhosis Neg Hx    Clotting disorder Neg Hx    Colitis Neg Hx    Colon cancer Neg Hx    Colon polyps Neg Hx    Crohn's disease Neg Hx    Cystic fibrosis Neg Hx    Diabetes Neg Hx    Esophageal cancer Neg Hx    Heart disease Neg Hx    Hemochromatosis Neg Hx    Inflammatory bowel disease Neg Hx    Irritable bowel syndrome Neg Hx    Liver cancer Neg Hx    Liver disease Neg Hx    Ovarian cancer Neg Hx    Pancreatic cancer Neg Hx    Prostate cancer Neg Hx    Rectal cancer Neg Hx    Stomach cancer Neg Hx    Ulcerative colitis Neg Hx    Uterine cancer Neg Hx    Wilson's disease Neg Hx    PE: BP 122/68 (BP Location: Left Arm, Patient Position: Sitting, Cuff Size: Normal)   Pulse 81   Ht 5' 8"$  (1.727 m)   Wt 141 lb (64 kg)   SpO2 98%   BMI 21.44 kg/m  Wt Readings from Last 3 Encounters:  03/05/22 141 lb (64 kg)  02/27/22 141 lb 9.6 oz (64.2 kg)  02/26/22 141 lb 9.6 oz (64.2 kg)   Constitutional: normal  weight, in NAD Eyes: EOMI, no exophthalmos ENT:  no thyromegaly, no cervical lymphadenopathy Cardiovascular: RRR, No MRG Respiratory: CTA B Musculoskeletal: no deformities Skin: no rashes Neurological: no tremor with outstretched hands  ASSESSMENT: 1. DM2, controlled, with complications - CAD  2. HL  PLAN:  1. Patient with longstanding extremely well-controlled type 1 diabetes, returning after having had low blood sugars at night.  His control is usually excellent, with the latest HbA1c  at 6.0% earlier this month, stable.  He uses an OmniPod 5 insulin pump integrated with the Dexcom CGM. -At last visit, he had some mild low blood sugars at night and I advised him to reduce his nocturnal basal rate (he did this), and also change the insulin target and if these continue, to also relax insulin to carb ratio with dinner.  However, he forgot about these recommendations and returns today after having had several low blood sugars at night. CGM interpretation: -At today's visit, we reviewed his CGM downloads: It appears that 92% of values are in target range (goal >70%), while 7% are higher than 180 (goal <25%), and 1% are lower than 70 (goal <4%).  The calculated average blood sugar is 126.  The projected HbA1c for the next 3 months (GMI) is 6.3%. -Reviewing the CGM trends, sugars appear to be well-controlled, with an occasional hyperglycemic spike after lunch or dinner, and lower blood sugars, occasionally lower than 70 at night.  He usually likes to keep his blood sugars very low within the low normal range.  However, at today's visit, we discussed about relaxing his insulin to carb ratio with dinner, which she agreed to try.  I also advised him to try to increase the target from 110 to 120 overnight, but he would like to hold off on this for now until he tries the other measures first.  He is also frustrated that his insulin boluses are going in very slowly and the sugars remain high after the meals.   He would be interested to reduce his active insulin time.  We did this today and I advised him to pay attention to insulin stacking.  In that case, I advised him to increase the insulin time by 1 hour. I advised him to: Patient Instructions  Please use the following pump settings: - basal rates: 12 am: 0.25 units/h 5 am: 0.45 7 am: 0.3 - ICR: 1:6, but change the ICR for dinner (6 pm to 12 am): 1:7 - target: 110. Try 120 if the sugars are still dropping overnight. - ISF: 1:40 - Active insulin time: 4h >> 2h  Please continue: - Metformin ER 2000 mg daily - Ozempic 0.5 mg weekly  Please return in ~6 months  - advised to check sugars at different times of the day - 4x a day, rotating check times - advised for yearly eye exams >> he is UTD - return to clinic in ~6 months  2. HL -Reviewed latest lipid panel: Fractions at goal: Lab Results  Component Value Date   CHOL 131 03/13/2021   HDL 85.10 03/13/2021   LDLCALC 35 03/13/2021   TRIG 50.0 03/13/2021   CHOLHDL 2 03/13/2021  -He continues on Lipitor 40 mg daily, WelChol 1300 mg twice a day, omega-3 fatty acids 2000 mg twice a day.  He has no side effects from these -he is due for another lipid panel -appointment with PCP is coming up  3.  Hypothyroidism - latest thyroid labs reviewed with pt. >> normal: Lab Results  Component Value Date   TSH 5.04 03/13/2021  - he continues on LT4 75 mcg daily - pt feels good on this dose. - we discussed about taking the thyroid hormone every day, with water, >30 minutes before breakfast, separated by >4 hours from acid reflux medications, calcium, iron, multivitamins. Pt. is taking it correctly. - will check thyroid tests at next visit with PCP, coming up at the end of the month  Philemon Kingdom, MD  PhD [facility] End[facility]

## 2022-03-05 NOTE — Patient Instructions (Addendum)
Please use the following pump settings: - basal rates: 12 am: 0.25 units/h 5 am: 0.45 7 am: 0.3 - ICR: 1:6, but change the ICR for dinner (6 pm to 12 am): 1:7 - target: 110. Try 120 if the sugars are still dropping overnight. - ISF: 1:40 - Active insulin time: 4h >> 2h  Please continue: - Metformin ER 2000 mg daily - Ozempic 0.5 mg weekly  Please return in ~6 months

## 2022-03-12 ENCOUNTER — Encounter: Payer: Self-pay | Admitting: Internal Medicine

## 2022-03-15 ENCOUNTER — Encounter: Payer: Self-pay | Admitting: Internal Medicine

## 2022-03-15 DIAGNOSIS — E119 Type 2 diabetes mellitus without complications: Secondary | ICD-10-CM

## 2022-03-15 MED ORDER — ACCU-CHEK GUIDE VI STRP: ORAL_STRIP | 3 refills | Status: DC

## 2022-03-16 ENCOUNTER — Encounter: Payer: Self-pay | Admitting: Family Medicine

## 2022-03-19 ENCOUNTER — Ambulatory Visit (INDEPENDENT_AMBULATORY_CARE_PROVIDER_SITE_OTHER): Payer: Medicare Other | Admitting: Family Medicine

## 2022-03-19 ENCOUNTER — Encounter: Payer: Self-pay | Admitting: Family Medicine

## 2022-03-19 VITALS — BP 132/72 | HR 65 | Temp 97.3°F | Ht 68.0 in | Wt 140.6 lb

## 2022-03-19 DIAGNOSIS — E559 Vitamin D deficiency, unspecified: Secondary | ICD-10-CM

## 2022-03-19 DIAGNOSIS — Z0001 Encounter for general adult medical examination with abnormal findings: Secondary | ICD-10-CM

## 2022-03-19 DIAGNOSIS — E1169 Type 2 diabetes mellitus with other specified complication: Secondary | ICD-10-CM | POA: Diagnosis not present

## 2022-03-19 DIAGNOSIS — E538 Deficiency of other specified B group vitamins: Secondary | ICD-10-CM | POA: Diagnosis not present

## 2022-03-19 DIAGNOSIS — N529 Male erectile dysfunction, unspecified: Secondary | ICD-10-CM

## 2022-03-19 DIAGNOSIS — E785 Hyperlipidemia, unspecified: Secondary | ICD-10-CM

## 2022-03-19 DIAGNOSIS — E119 Type 2 diabetes mellitus without complications: Secondary | ICD-10-CM | POA: Diagnosis not present

## 2022-03-19 DIAGNOSIS — E039 Hypothyroidism, unspecified: Secondary | ICD-10-CM | POA: Diagnosis not present

## 2022-03-19 DIAGNOSIS — K219 Gastro-esophageal reflux disease without esophagitis: Secondary | ICD-10-CM

## 2022-03-19 LAB — COMPREHENSIVE METABOLIC PANEL
ALT: 93 U/L — ABNORMAL HIGH (ref 0–53)
AST: 84 U/L — ABNORMAL HIGH (ref 0–37)
Albumin: 4.2 g/dL (ref 3.5–5.2)
Alkaline Phosphatase: 714 U/L — ABNORMAL HIGH (ref 39–117)
BUN: 23 mg/dL (ref 6–23)
CO2: 31 mEq/L (ref 19–32)
Calcium: 10 mg/dL (ref 8.4–10.5)
Chloride: 95 mEq/L — ABNORMAL LOW (ref 96–112)
Creatinine, Ser: 0.72 mg/dL (ref 0.40–1.50)
GFR: 91.8 mL/min (ref >60.00)
Glucose, Bld: 133 mg/dL — ABNORMAL HIGH (ref 70–99)
Potassium: 4.6 mEq/L (ref 3.5–5.1)
Sodium: 135 mEq/L (ref 135–145)
Total Bilirubin: 0.7 mg/dL (ref 0.2–1.2)
Total Protein: 6.6 g/dL (ref 6.0–8.3)

## 2022-03-19 LAB — LIPID PANEL
Cholesterol: 150 mg/dL (ref 0–200)
HDL: 89.7 mg/dL (ref >39.00)
LDL Cholesterol: 42 mg/dL (ref 0–99)
NonHDL: 59.86
Total CHOL/HDL Ratio: 2
Triglycerides: 90 mg/dL (ref 0.0–149.0)
VLDL: 18 mg/dL (ref 0.0–40.0)

## 2022-03-19 LAB — CBC
HCT: 39 % (ref 39.0–52.0)
Hemoglobin: 12.9 g/dL — ABNORMAL LOW (ref 13.0–17.0)
MCHC: 33.1 g/dL (ref 30.0–36.0)
MCV: 87.9 fl (ref 78.0–100.0)
Platelets: 254 10*3/uL (ref 150.0–400.0)
RBC: 4.43 Mil/uL (ref 4.22–5.81)
RDW: 14.9 % (ref 11.5–15.5)
WBC: 7.4 10*3/uL (ref 4.0–10.5)

## 2022-03-19 LAB — PSA: PSA: 5.33 ng/mL — ABNORMAL HIGH (ref 0.10–4.00)

## 2022-03-19 LAB — VITAMIN D 25 HYDROXY (VIT D DEFICIENCY, FRACTURES): VITD: 40.36 ng/mL (ref 30.00–100.00)

## 2022-03-19 LAB — TSH: TSH: 6.83 u[IU]/mL — ABNORMAL HIGH (ref 0.35–5.50)

## 2022-03-19 LAB — VITAMIN B12: Vitamin B-12: 468 pg/mL (ref 211–911)

## 2022-03-19 MED ORDER — TERBINAFINE HCL 250 MG PO TABS: 250.0000 mg | ORAL_TABLET | Freq: Every day | ORAL | 0 refills | Status: DC

## 2022-03-19 NOTE — Assessment & Plan Note (Signed)
On omeprazole as needed.  Check B12.

## 2022-03-19 NOTE — Assessment & Plan Note (Signed)
Continue management per endocrinology.  Recently saw them and A1c was 6.0.  He is on metformin 500 mg twice daily, Ozempic 0.5 mg weekly, and insulin pump.

## 2022-03-19 NOTE — Assessment & Plan Note (Signed)
Recently saw urology for this and is doing well status post penile prosthesis.

## 2022-03-19 NOTE — Assessment & Plan Note (Signed)
Follows with endocrinology.  Check TSH.

## 2022-03-19 NOTE — Telephone Encounter (Signed)
Patient seen PCP today

## 2022-03-19 NOTE — Patient Instructions (Addendum)
It was very nice to see you today!  You have toenail fungus.  Please start the terbinafine. Please let us know if you have any side effects.  We will check blood work today.   We will see you back in a year for your next physical. Come back sooner if needed  Take care, Dr Jerline Pain  PLEASE NOTE:  If you had any lab tests, please let us know if you have not heard back within a few days. You may see your results on mychart before we have a chance to review them but we will give you a call once they are reviewed by [place].   If we ordered any referrals today, please let us know if you have not heard from their office within the next week.   If you had any urgent prescriptions sent in today, please check with the pharmacy within an hour of our visit to make sure the prescription was transmitted appropriately.   Please try these tips to maintain a healthy lifestyle:  Eat at least 3 REAL meals and 1-2 snacks per day.  Aim for no more than 5 hours between eating.  If you eat breakfast, please do so within one hour of getting up.   Each meal should contain half fruits/vegetables, one quarter protein, and one quarter carbs (no bigger than a computer mouse)  Cut down on sweet beverages. This includes juice, soda, and sweet tea.   Drink at least 1 glass of water with each meal and aim for at least 8 glasses per day  Exercise at least 150 minutes every week.    Preventive Care 58 Years and Older, Male Preventive care refers to lifestyle choices and visits with your health care provider that can promote health and wellness. Preventive care visits are also called wellness exams. What can I expect for my preventive care visit? Counseling During your preventive care visit, your health care provider may ask about your: Medical history, including: Past medical problems. Family medical history. History of falls. Current health, including: Emotional well-being. Home life and relationship  well-being. Sexual activity. Memory and ability to understand (cognition). Lifestyle, including: Alcohol, nicotine or tobacco, and drug use. Access to firearms. Diet, exercise, and sleep habits. Work and work Statistician. Sunscreen use. Safety issues such as seatbelt and bike helmet use. Physical exam Your health care provider will check your: Height and weight. These may be used to calculate your BMI (body mass index). BMI is a measurement that tells if you are at a healthy weight. Waist circumference. This measures the distance around your waistline. This measurement also tells if you are at a healthy weight and may help predict your risk of certain diseases, such as type 2 diabetes and high blood pressure. Heart rate and blood pressure. Body temperature. Skin for abnormal spots. What immunizations do I need?  Vaccines are usually given at various ages, according to a schedule. Your health care provider will recommend vaccines for you based on your age, medical history, and lifestyle or other factors, such as travel or where you work. What tests do I need? Screening Your health care provider may recommend screening tests for certain conditions. This may include: Lipid and cholesterol levels. Diabetes screening. This is done by checking your blood sugar (glucose) after you have not eaten for a while (fasting). Hepatitis C test. Hepatitis B test. HIV (human immunodeficiency virus) test. STI (sexually transmitted infection) testing, if you are at risk. Lung cancer screening. Colorectal cancer screening. Prostate cancer  screening. Abdominal aortic aneurysm (AAA) screening. You may need this if you are a current or former smoker. Talk with your health care provider about your test results, treatment options, and if necessary, the need for more tests. Follow these instructions at home: Eating and drinking  Eat a diet that includes fresh fruits and vegetables, whole grains, lean  protein, and low-fat dairy products. Limit your intake of foods with high amounts of sugar, saturated fats, and salt. Take vitamin and mineral supplements as recommended by your health care provider. Do not drink alcohol if your health care provider tells you not to drink. If you drink alcohol: Limit how much you have to 0-2 drinks a day. Know how much alcohol is in your drink. In the [place] one drink equals one 12 oz bottle of beer (355 mL), one 5 oz glass of wine (148 mL), or one 1 oz glass of hard liquor (44 mL). Lifestyle Brush your teeth every morning and night with fluoride toothpaste. Floss one time each day. Exercise for at least 30 minutes 5 or more days each week. Do not use any products that contain nicotine or tobacco. These products include cigarettes, chewing tobacco, and vaping devices, such as e-cigarettes. If you need help quitting, ask your health care provider. Do not use drugs. If you are sexually active, practice safe sex. Use a condom or other form of protection to prevent STIs. Take aspirin only as told by your health care provider. Make sure that you understand how much to take and what form to take. Work with your health care provider to find out whether it is safe and beneficial for you to take aspirin daily. Ask your health care provider if you need to take a cholesterol-lowering medicine (statin). Find healthy ways to manage stress, such as: Meditation, yoga, or listening to music. Journaling. Talking to a trusted person. Spending time with friends and family. Safety Always wear your seat belt while driving or riding in a vehicle. Do not drive: If you have been drinking alcohol. Do not ride with someone who has been drinking. When you are tired or distracted. While texting. If you have been using any mind-altering substances or drugs. Wear a helmet and other protective equipment during sports activities. If you have firearms in your house, make sure you follow  all gun safety procedures. Minimize exposure to UV radiation to reduce your risk of skin cancer. What's next? Visit your health care provider once a year for an annual wellness visit. Ask your health care provider how often you should have your eyes and teeth checked. Stay up to date on all vaccines. This information is not intended to replace advice given to you by your health care provider. Make sure you discuss any questions you have with your health care provider. Document Revised: 07/06/2020 Document Reviewed: 07/06/2020 Elsevier Patient Education  [place].

## 2022-03-19 NOTE — Progress Notes (Signed)
Chief Complaint:  David Maynard is a 72 y.o. male who presents today for his annual comprehensive physical exam.    Assessment/Plan:  New/Acute Problems: Onychomycosis  No red flags.  Will check transaminases today.  Start terbinafine.  We discussed potential side effects.  He will let me know if not improving.  Chronic Problems Addressed Today: Hypothyroidism Follows with endocrinology.  Check TSH.  Dyslipidemia associated with type 2 diabetes mellitus (HCC) Check lipids.  On Lipitor 40 mg daily and tolerating well.   T2DM (type 2 diabetes mellitus) ([place]) Continue management per endocrinology.  Recently saw them and A1c was 6.0.  He is on metformin 500 mg twice daily, Ozempic 0.5 mg weekly, and insulin pump.   ED (erectile dysfunction) Recently saw urology for this and is doing well status post penile prosthesis.  Gastroesophageal reflux disease On omeprazole as needed.  Check B12.  Preventative Healthcare: Check labs.  Up-to-date on vaccines and cancer screening.  Patient Counseling(The following topics were reviewed and/or handout was given):  -Nutrition: Stressed importance of moderation in sodium/caffeine intake, saturated fat and cholesterol, caloric balance, sufficient intake of fresh fruits, vegetables, and fiber.  -Stressed the importance of regular exercise.   -Substance Abuse: Discussed cessation/primary prevention of tobacco, alcohol, or other drug use; driving or other dangerous activities under the influence; availability of treatment for abuse.   -Injury prevention: Discussed safety belts, safety helmets, smoke detector, smoking near bedding or upholstery.   -Sexuality: Discussed sexually transmitted diseases, partner selection, use of condoms, avoidance of unintended pregnancy and contraceptive alternatives.   -Dental health: Discussed importance of regular tooth brushing, flossing, and dental visits.  -Health maintenance and immunizations reviewed. Please refer  to Health maintenance section.  Return to care in 1 year for next preventative visit.     Subjective:  HPI:  He has no acute complaints today.   He has noticed thickening of his toenails. This has been going on for years. Has been treated in the past which worked but seems to be worsening.   Lifestyle Diet: Balanced. Plenty of fruits and vegetables.  Exercise: Limited due to recent surgery but wants to get back into being active. Likes to play tennis.      03/19/2022    1:14 PM  Depression screen PHQ 2/9  Decreased Interest 0  Down, Depressed, Hopeless 0  PHQ - 2 Score 0    Health Maintenance Due  Topic Date Due   COVID-19 Vaccine (4 - 2023-24 season) 09/22/2021   Diabetic kidney evaluation - Urine ACR  03/13/2022     ROS: Per HPI, otherwise a complete review of systems was negative.   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   and left   Thyroid disease    Unspecified hypothyroidism    Patient Active Problem List   Diagnosis Date Noted   S/P insertion  of penile implant 0000000   Umbilical hernia without obstruction and without gangrene 02/23/2020   Gastroesophageal reflux disease 01/13/2019   BPH  01/08/2018   ED (erectile dysfunction) 01/08/2018   OAB (overactive bladder) 01/08/2018   History of bilateral knee replacement 12/11/2017   Lumbar disc disease 09/22/2010   T2DM (type 2 diabetes mellitus)  ([place])    Hypogonadism male 06/19/2007   Dyslipidemia associated with type 2 diabetes mellitus ([place]) 06/19/2007   Hypothyroidism 03/05/2007   Past Surgical History:  Procedure Laterality Date   COLONOSCOPY     COLONOSCOPY WITH PROPOFOL  02/28/2021   ESOPHAGOGASTRODUODENOSCOPY  06/16/2003   EYE SURGERY     INGUINAL HERNIA REPAIR     left   JOINT REPLACEMENT     KNEE ARTHROSCOPY     Left   KNEE ARTHROSCOPY W/ ACL RECONSTRUCTION     left   PENILE PROSTHESIS IMPLANT N/A 02/27/2022   Procedure: INSERTION OF INFLATABLE PENILE IMPLANT;  Surgeon: Vira Agar, MD;  Location: [facility];  Service: Urology;  Laterality: N/A;  90 MINUTES NEEDED FOR CASE   ROTATOR CUFF REPAIR     Right x2   Stress Cardiolite  07/15/2000   TOTAL KNEE ARTHROPLASTY Right 11/04/2017   TOTAL KNEE ARTHROPLASTY Right 11/04/2017   Procedure: TOTAL KNEE ARTHROPLASTY;  Surgeon: Elsie Saas, MD;  Location: [facility];  Service: Orthopedics;  Laterality: Right;   TOTAL KNEE ARTHROPLASTY Left 12/23/2017   TOTAL KNEE ARTHROPLASTY Left 12/23/2017   Procedure: TOTAL KNEE ARTHROPLASTY;  Surgeon: Elsie Saas, MD;  Location: [facility];  Service: Orthopedics;  Laterality: Left;   VASECTOMY  05/2002    Family History  Problem Relation Age of Onset   Hypertension Mother    Kidney disease Father    Breast cancer Neg Hx    Celiac disease Neg Hx    Cirrhosis Neg Hx    Clotting disorder Neg Hx    Colitis Neg Hx    Colon cancer Neg Hx    Colon polyps Neg Hx    Crohn's disease Neg Hx    Cystic fibrosis Neg Hx    Diabetes Neg Hx    Esophageal cancer Neg Hx    Heart disease Neg Hx    Hemochromatosis Neg Hx    Inflammatory bowel disease Neg Hx    Irritable bowel syndrome Neg Hx    Liver cancer Neg Hx    Liver disease Neg Hx    Ovarian cancer Neg Hx    Pancreatic cancer Neg Hx    Prostate cancer Neg Hx    Rectal cancer Neg Hx    Stomach cancer Neg Hx    Ulcerative colitis Neg Hx    Uterine cancer Neg Hx    Wilson's disease  Neg Hx     Medications- reviewed and updated Current Outpatient Medications  Medication Sig Dispense Refill   acetaminophen (TYLENOL) 500 MG tablet Take 2 tablets (1,000 mg total) by mouth every 6 (six) hours. 30 tablet 0   ascorbic acid (VITAMIN C) 500 MG tablet Take 1 tablet (500 mg total) by mouth daily. 20 tablet 0   aspirin EC 81 MG tablet Take 81 mg by mouth daily. Swallow whole.     atorvastatin (LIPITOR) 40 MG tablet TAKE 1 TABLET BY MOUTH  DAILY 90 tablet 3   B-D 3CC LUER-LOK SYR 25GX1" 25G X 1" 3 ML MISC USE AS DIRECTED MONTHLY FOR B-12 INJECTIONS 3 each 0   Blood Glucose Monitoring Suppl (ACCU-CHEK GUIDE ME) w/Device  KIT USE AS DIRECTED 1 kit 0   Calcium Carbonate (CALCIUM 600 PO) Take 1 tablet by mouth daily.     celecoxib (CELEBREX) 200 MG capsule Take 1 capsule (200 mg total) by mouth 2 (two) times daily. 28 capsule 0   Cinnamon 500 MG capsule Take 3,000 mg by mouth 2 (two) times daily.     clomiPHENE (CLOMID) 50 MG tablet TAKE ONE-HALF TABLET BY  MOUTH DAILY 45 tablet 3   colesevelam (WELCHOL) 625 MG tablet Take 4 tablets (2,500 mg total) by mouth daily. (Patient taking differently: Take 1,250 mg by mouth 2 (two) times daily with a meal.) 360 tablet 3   Continuous Blood Gluc Sensor (DEXCOM G6 SENSOR) MISC 1 Device by Does not apply route See admin instructions. 1 every 10 days 9 each 3   cyanocobalamin (DODEX) 1000 MCG/ML injection Inject 1 mL (1,000 mcg total) into the muscle every 30 (thirty) days. 3 mL 0   docusate sodium (COLACE) 100 MG capsule Take 100 mg by mouth 2 (two) times daily. Takes daily     Glucagon 3 MG/DOSE POWD Place 3 mg into the nose once as needed for up to 1 dose. 1 each 11   Glucosamine-Chondroit-Vit C-Mn (GLUCOSAMINE CHONDR 1500 COMPLX PO) Take 2 tablets by mouth daily.     glucose blood (ACCU-CHEK GUIDE) test strip USE TO CHECK BLOOD SUGAR 6  TIMES DAILY 600 strip 3   Insulin Disposable Pump (OMNIPOD 5 G6 POD, GEN 5,) MISC Change pod every 72 hours. 10  each 10   insulin lispro (HUMALOG) 100 UNIT/ML injection For use in pump, total of 50 units per day. 50 mL 3   Insulin Pen Needle 31G X 5 MM MISC 1 Device by Does not apply route in the morning and at bedtime. 200 each 3   levothyroxine (SYNTHROID) 75 MCG tablet Take 1 tablet (75 mcg total) by mouth daily. 90 tablet 3   metFORMIN (GLUCOPHAGE-XR) 500 MG 24 hr tablet Take 2 tablets (1,000 mg total) by mouth 2 (two) times daily. 360 tablet 1   Multiple Vitamin (MULTIVITAMIN) tablet Take 1 tablet by mouth daily.     MYRBETRIQ 50 MG TB24 tablet Take 50 mg by mouth daily.     omega-3 acid ethyl esters (LOVAZA) 1 g capsule TAKE 2 CAPSULES BY MOUTH  TWICE DAILY 360 capsule 3   oxyCODONE (OXY IR/ROXICODONE) 5 MG immediate release tablet Take 1 tablet (5 mg total) by mouth every 6 (six) hours as needed for severe pain. 16 tablet 0   OZEMPIC, 0.25 OR 0.5 MG/DOSE, 2 MG/3ML SOPN Inject 2 mg into the skin once a week.     Semaglutide,0.25 or 0.'5MG'$ /DOS, (OZEMPIC, 0.25 OR 0.5 MG/DOSE,) 2 MG/1.5ML SOPN Inject 0.5 mg into the skin once a week. 4.5 mL 5   sulfamethoxazole-trimethoprim (BACTRIM DS) 800-160 MG tablet Take 1 tablet by mouth 2 (two) times daily. 14 tablet 0   tadalafil (ADCIRCA/CIALIS) 20 MG tablet Take 20 mg by mouth daily as needed for erectile dysfunction.     terbinafine (LAMISIL) 250 MG tablet Take 1 tablet (250 mg total) by mouth daily. 90 tablet 0   Trospium Chloride 60 MG CP24 Take 60 mg by mouth daily.     Turmeric 500 MG CAPS Take 500-1,000 mg by mouth daily. May take an extra dose if plays golf or tennis     VITAMIN D, CHOLECALCIFEROL, PO Take 1 tablet by mouth daily.     vitamin E 200 UNIT capsule Take  200 Units by mouth daily.     No current facility-administered medications for this visit.    Allergies-reviewed and updated No Known Allergies  Social History   Socioeconomic History   Marital status: Married    Spouse name: Not on file   Number of children: 2   Years of  education: Not on file   Highest education level: Not on file  Occupational History   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  Tobacco Use   Smoking status: Never   Smokeless tobacco: Never  Vaping Use   Vaping Use: Never used  Substance and Sexual Activity   Alcohol use: Yes    Alcohol/week: 1.0 standard drink of alcohol    Types: 1 Cans of beer per week    Comment: weekly   Drug use: No   Sexual activity: Not on file  Other Topics Concern   Not on file  Social History Narrative   Not on file   Social Determinants of Health   Financial Resource Strain: Low Risk  (10/09/2021)   Overall Financial Resource Strain (CARDIA)    Difficulty of Paying Living Expenses: Not hard at all  Food Insecurity: No Food Insecurity (02/27/2022)   Hunger Vital Sign    Worried About Running Out of Food in the Last Year: Never true    Ran Out of Food in the Last Year: Never true  Transportation Needs: No Transportation Needs (02/27/2022)   PRAPARE - Hydrologist (Medical): No    Lack of Transportation (Non-Medical): No  Physical Activity: Sufficiently Active (10/09/2021)   Exercise Vital Sign    Days of Exercise per Week: 5 days    Minutes of Exercise per Session: 120 min  Stress: No Stress Concern Present (10/09/2021)   [place]    Feeling of Stress : Not at all  Social Connections: [place] (10/09/2021)   Social Connection and Isolation Panel [NHANES]    Frequency of Communication with Friends and Family: More than three times a week    Frequency of Social Gatherings with Friends and Family: More than three times a week    Attends Religious Services: More than 4 times per year    Active Member of [employer] or Organizations: Yes    Attends Archivist Meetings: 1 to 4 times per year    Marital Status: Married        Objective:  Physical Exam: BP 132/72   Pulse 65   Temp  (!) 97.3 F (36.3 C) (Temporal)   Ht '5\' 8"'$  (1.727 m)   Wt 140 lb 9.6 oz (63.8 kg)   SpO2 98%   BMI 21.38 kg/m   Body mass index is 21.38 kg/m. Wt Readings from Last 3 Encounters:  03/19/22 140 lb 9.6 oz (63.8 kg)  03/05/22 141 lb (64 kg)  02/27/22 141 lb 9.6 oz (64.2 kg)   Gen: NAD, resting comfortably HEENT: TMs normal bilaterally. OP clear. No thyromegaly noted.  CV: RRR with no murmurs appreciated Pulm: NWOB, CTAB with no crackles, wheezes, or rhonchi GI: Normal bowel sounds present. Soft, Nontender, Nondistended. MSK: no edema, cyanosis, or clubbing noted - Foot: Onychomycosis on bilateral great toe nails. Skin: warm, dry Neuro: CN2-12 grossly intact. Strength 5/5 in upper and lower extremities. Reflexes symmetric and intact bilaterally.  Psych: Normal affect and thought content     Bryn Perkin M. Jerline Pain, MD 03/19/2022 2:16 PM

## 2022-03-19 NOTE — Assessment & Plan Note (Signed)
Check lipids.  On Lipitor 40 mg daily and tolerating well.

## 2022-03-21 NOTE — Telephone Encounter (Signed)
Please advise 

## 2022-03-22 NOTE — Telephone Encounter (Signed)
See result note.  This could be coming from his Tylenol though there are other potential causes as well.  Would like for him to come back to recheck this week.  HE needs to avoid the terbinafine we sent in until we make sure his liver numbers are stable.

## 2022-03-22 NOTE — Progress Notes (Signed)
Please inform patient of the following:  His liver numbers are elevated.  There are many reasons for this but I would like for him to come back to recheck.  Please place future order for CMET and GGT. please have him avoid any Tylenol and not start the terbinafine that we prescribed until we can recheck.   TSH was also a bit off.  He can discuss this with endocrinology if any dose adjustments need to be made.  His PSA is up quite a bit since last year.  Recommend he discuss with urology regarding any further testing that needs to be done with this.  His cholesterol levels are at goal his B12 and vitamin D levels were low as well.  We can recheck these in a year.

## 2022-03-26 ENCOUNTER — Other Ambulatory Visit: Payer: Self-pay | Admitting: *Deleted

## 2022-03-26 DIAGNOSIS — R748 Abnormal levels of other serum enzymes: Secondary | ICD-10-CM

## 2022-04-02 ENCOUNTER — Other Ambulatory Visit (INDEPENDENT_AMBULATORY_CARE_PROVIDER_SITE_OTHER): Payer: Medicare Other

## 2022-04-02 DIAGNOSIS — R748 Abnormal levels of other serum enzymes: Secondary | ICD-10-CM | POA: Diagnosis not present

## 2022-04-02 LAB — COMPREHENSIVE METABOLIC PANEL
ALT: 54 U/L — ABNORMAL HIGH (ref 0–53)
AST: 46 U/L — ABNORMAL HIGH (ref 0–37)
Albumin: 3.9 g/dL (ref 3.5–5.2)
Alkaline Phosphatase: 595 U/L — ABNORMAL HIGH (ref 39–117)
BUN: 15 mg/dL (ref 6–23)
CO2: 27 mEq/L (ref 19–32)
Calcium: 9.5 mg/dL (ref 8.4–10.5)
Chloride: 95 mEq/L — ABNORMAL LOW (ref 96–112)
Creatinine, Ser: 0.8 mg/dL (ref 0.40–1.50)
GFR: 88.9 mL/min (ref >60.00)
Glucose, Bld: 126 mg/dL — ABNORMAL HIGH (ref 70–99)
Potassium: 4.5 mEq/L (ref 3.5–5.1)
Sodium: 130 mEq/L — ABNORMAL LOW (ref 135–145)
Total Bilirubin: 0.6 mg/dL (ref 0.2–1.2)
Total Protein: 6.2 g/dL (ref 6.0–8.3)

## 2022-04-02 LAB — GAMMA GT: GGT: 622 U/L — ABNORMAL HIGH (ref 7–51)

## 2022-04-04 ENCOUNTER — Other Ambulatory Visit: Payer: Self-pay | Admitting: *Deleted

## 2022-04-04 DIAGNOSIS — R748 Abnormal levels of other serum enzymes: Secondary | ICD-10-CM

## 2022-04-04 NOTE — Progress Notes (Signed)
Please inform patient of the following:  His liver numbers are a little bit better than last time however his gallbladder number is significantly elevated.  It is possible that he could be having gallstones causing inflammation in his liver. Next step for [place] is to get a right upper quadrant ultrasound to look for gallstones or other possible causes for his elevated numbers.  Please place order.

## 2022-04-06 ENCOUNTER — Encounter: Payer: Self-pay | Admitting: Family Medicine

## 2022-04-06 ENCOUNTER — Other Ambulatory Visit: Payer: Self-pay | Admitting: Family Medicine

## 2022-04-09 NOTE — Telephone Encounter (Signed)
Spoke with patient, patient notified Rx was send in on 04/06/2022

## 2022-04-27 ENCOUNTER — Ambulatory Visit
Admission: RE | Admit: 2022-04-27 | Discharge: 2022-04-27 | Disposition: A | Discharge status: Home / Self Care | Payer: Medicare Other | Source: Ambulatory Visit | Attending: Family Medicine | Admitting: Family Medicine

## 2022-04-27 DIAGNOSIS — R748 Abnormal levels of other serum enzymes: Secondary | ICD-10-CM

## 2022-04-30 NOTE — Progress Notes (Signed)
Please inform patient of the following:  The were not able to visualize his gallbladder on the ultrasound. Recommend referral at this point for specialized testing.  Katina Degree. Jimmey Ralph, MD 04/30/2022 1:10 PM

## 2022-05-01 ENCOUNTER — Other Ambulatory Visit: Payer: Self-pay | Admitting: *Deleted

## 2022-05-01 ENCOUNTER — Encounter: Payer: Self-pay | Admitting: Family Medicine

## 2022-05-01 ENCOUNTER — Other Ambulatory Visit: Payer: Self-pay | Admitting: Family Medicine

## 2022-05-01 DIAGNOSIS — R748 Abnormal levels of other serum enzymes: Secondary | ICD-10-CM

## 2022-05-02 ENCOUNTER — Other Ambulatory Visit: Payer: Self-pay

## 2022-05-02 ENCOUNTER — Encounter: Payer: Self-pay | Admitting: Gastroenterology

## 2022-05-02 ENCOUNTER — Other Ambulatory Visit (INDEPENDENT_AMBULATORY_CARE_PROVIDER_SITE_OTHER): Payer: Medicare Other

## 2022-05-02 ENCOUNTER — Ambulatory Visit (INDEPENDENT_AMBULATORY_CARE_PROVIDER_SITE_OTHER): Payer: Medicare Other | Admitting: Gastroenterology

## 2022-05-02 ENCOUNTER — Encounter: Payer: Self-pay | Admitting: Internal Medicine

## 2022-05-02 ENCOUNTER — Other Ambulatory Visit: Payer: Medicare Other

## 2022-05-02 VITALS — BP 120/70 | HR 79 | Ht 68.0 in | Wt 140.0 lb

## 2022-05-02 DIAGNOSIS — R7989 Other specified abnormal findings of blood chemistry: Secondary | ICD-10-CM | POA: Diagnosis not present

## 2022-05-02 LAB — HEPATIC FUNCTION PANEL
ALT: 41 U/L (ref 0–53)
AST: 46 U/L — ABNORMAL HIGH (ref 0–37)
Albumin: 4.1 g/dL (ref 3.5–5.2)
Alkaline Phosphatase: 414 U/L — ABNORMAL HIGH (ref 39–117)
Bilirubin, Direct: 0.2 mg/dL (ref 0.0–0.3)
Total Bilirubin: 0.5 mg/dL (ref 0.2–1.2)
Total Protein: 6.7 g/dL (ref 6.0–8.3)

## 2022-05-02 LAB — PROTIME-INR
INR: 1 ratio (ref 0.8–1.0)
Prothrombin Time: 10.8 s (ref 9.6–13.1)

## 2022-05-02 NOTE — Patient Instructions (Signed)
Your provider has requested that you go to the basement level for lab work before leaving today. Press "B" on the elevator. The lab is located at the first door on the left as you exit the elevator.  The [facility] providers would like to encourage you to use MYCHART to communicate with providers for non-urgent requests or questions.  Due to long hold times on the telephone, sending your provider a message by MYCHART may be a faster and more efficient way to get a response.  Please allow 48 business hours for a response.  Please remember that this is for non-urgent requests.   Due to recent changes in healthcare laws, you may see the results of your imaging and laboratory studies on MyChart before your provider has had a chance to review them.  We understand that in some cases there may be results that are confusing or concerning to you. Not all laboratory results come back in the same time frame and the provider may be waiting for multiple results in order to interpret others.  Please give us 48 hours in order for your provider to thoroughly review all the results before contacting the office for clarification of your results.   Thank you for choosing me and [facility].  Malcolm T. Stark, Jr., MD., FACG  

## 2022-05-02 NOTE — Progress Notes (Signed)
    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  Monitor LFTs every few weeks until they normalize.  If they do not normalize proceed with serologic evaluation and  abd MRI/MRCP.    HPI    This is a 72 year old male with elevated LFTs in a cholestatic pattern.  S/P penile implant February 27, 2022.  He was placed on a 7-day course of Bactrim DS which he completed.  He was taking Tylenol for pain control no more than 3 g/day which has now been discontinued.  On February 26 he was noted to have elevated LFTs as below and they have improved.  Abdominal ultrasound unremarkable.  He is asymptomatic.  He has no history of liver disease.   Labs / Imaging       Latest Ref Rng & Units 04/02/2022   11:08 AM 03/19/2022    2:07 PM 03/13/2021    1:54 PM  Hepatic Function  Total Protein 6.0 - 8.3 g/dL 6.2  6.6  6.3   Albumin 3.5 - 5.2 g/dL 3.9  4.2  4.2   AST 0 - 37 U/L 46  84  29   ALT 0 - 53 U/L 54  93  20   Alk Phosphatase 39 - 117 U/L 595  714  52   Total Bilirubin 0.2 - 1.2 mg/dL 0.6  0.7  0.5        Latest Ref Rng & Units 03/19/2022    2:07 PM 02/21/2022   10:58 AM 03/13/2021    1:54 PM  CBC  WBC 4.0 - 10.5 K/uL 7.4  8.8  6.1   Hemoglobin 13.0 - 17.0 g/dL 95.0  93.2  67.1   Hematocrit 39.0 - 52.0 % 39.0  39.1  36.9   Platelets 150.0 - 400.0 K/uL 254.0  213  183.0    Current Medications, Allergies, Past Medical History, Past Surgical History, Family History and Social History were reviewed in [employer] record.   Physical Exam: General: Well developed, well nourished, no acute distress Head: Normocephalic and atraumatic Eyes: Sclerae anicteric, EOMI Ears: Normal auditory acuity Mouth: No deformities or lesions noted Lungs: Clear throughout to auscultation Heart: Regular rate and rhythm; No murmurs, rubs or bruits Abdomen: Soft, non tender and non  distended. No masses, hepatosplenomegaly or hernias noted. Normal Bowel sounds Rectal: Not done Musculoskeletal: Symmetrical with no gross deformities  Pulses:  Normal pulses noted Extremities: No edema or deformities noted Neurological: Alert oriented x 4, grossly nonfocal Psychological:  Alert and cooperative. Normal mood and affect   Linsay Vogt T. Russella Dar, MD 05/02/2022, 9:23 AM

## 2022-05-02 NOTE — Telephone Encounter (Signed)
Inbound fax from DME supplier requesting form be completed and faxed with clinical notes. DME supplies ordered via [employer] through online portal. [facility]: Dexcom G6

## 2022-05-02 NOTE — Telephone Encounter (Signed)
Spoke with patient, notified records send was from [facility] Patient stated send his mom records by mistake. Will send his record sometime today

## 2022-05-07 LAB — TESTOSTERONE: Testosterone: 3.85

## 2022-05-07 LAB — PSA: PSA: 4.47

## 2022-05-14 ENCOUNTER — Encounter: Payer: Self-pay | Admitting: Internal Medicine

## 2022-05-14 DIAGNOSIS — E119 Type 2 diabetes mellitus without complications: Secondary | ICD-10-CM

## 2022-05-16 ENCOUNTER — Other Ambulatory Visit: Payer: Self-pay | Admitting: Urology

## 2022-05-16 DIAGNOSIS — R972 Elevated prostate specific antigen [PSA]: Secondary | ICD-10-CM

## 2022-05-22 MED ORDER — INSULIN LISPRO-AABC 100 UNIT/ML IJ SOLN: 50.0000 [IU] | Freq: Every day | INTRAMUSCULAR | 1 refills | Status: AC

## 2022-05-22 MED ORDER — INSULIN LISPRO 100 UNIT/ML CARTRIDGE: 50.0000 [IU] | Freq: Every day | SUBCUTANEOUS | 3 refills | Status: AC

## 2022-05-24 MED ORDER — INSULIN LISPRO 100 UNIT/ML IJ SOLN: 50.0000 [IU] | Freq: Every day | INTRAMUSCULAR | 3 refills | Status: DC

## 2022-05-24 NOTE — Addendum Note (Signed)
Addended by: Kenyon Ana on: 05/24/2022 05:27 PM   Modules accepted: Orders

## 2022-06-01 ENCOUNTER — Other Ambulatory Visit (INDEPENDENT_AMBULATORY_CARE_PROVIDER_SITE_OTHER): Payer: Medicare Other

## 2022-06-01 DIAGNOSIS — R7989 Other specified abnormal findings of blood chemistry: Secondary | ICD-10-CM

## 2022-06-01 LAB — HEPATIC FUNCTION PANEL
ALT: 44 U/L (ref 0–53)
AST: 51 U/L — ABNORMAL HIGH (ref 0–37)
Albumin: 4.2 g/dL (ref 3.5–5.2)
Alkaline Phosphatase: 383 U/L — ABNORMAL HIGH (ref 39–117)
Bilirubin, Direct: 0.2 mg/dL (ref 0.0–0.3)
Total Bilirubin: 0.5 mg/dL (ref 0.2–1.2)
Total Protein: 6.9 g/dL (ref 6.0–8.3)

## 2022-06-04 ENCOUNTER — Encounter: Payer: Self-pay | Admitting: Family Medicine

## 2022-06-04 ENCOUNTER — Other Ambulatory Visit: Payer: Self-pay

## 2022-06-04 DIAGNOSIS — R7989 Other specified abnormal findings of blood chemistry: Secondary | ICD-10-CM

## 2022-06-05 ENCOUNTER — Other Ambulatory Visit: Payer: Self-pay | Admitting: *Deleted

## 2022-06-05 MED ORDER — "BD LUER-LOK SYRINGE 25G X 1"" 3 ML MISC": 0 refills | Status: DC

## 2022-06-05 NOTE — Telephone Encounter (Signed)
Ok to send in.  Katina Degree. Jimmey Ralph, MD 06/05/2022 1:04 PM

## 2022-06-05 NOTE — Telephone Encounter (Signed)
Please advise 

## 2022-06-17 ENCOUNTER — Other Ambulatory Visit: Payer: Self-pay | Admitting: Family Medicine

## 2022-06-17 ENCOUNTER — Other Ambulatory Visit: Payer: Self-pay | Admitting: Internal Medicine

## 2022-06-17 DIAGNOSIS — E039 Hypothyroidism, unspecified: Secondary | ICD-10-CM

## 2022-06-18 ENCOUNTER — Encounter (INDEPENDENT_AMBULATORY_CARE_PROVIDER_SITE_OTHER): Payer: Medicare Other | Admitting: Internal Medicine

## 2022-06-18 DIAGNOSIS — Z794 Long term (current) use of insulin: Secondary | ICD-10-CM | POA: Diagnosis not present

## 2022-06-18 DIAGNOSIS — E119 Type 2 diabetes mellitus without complications: Secondary | ICD-10-CM

## 2022-06-19 NOTE — Telephone Encounter (Addendum)
    CGM interpretation: -At today's visit, we reviewed his CGM downloads: It appears that 94% of values are in target range (goal >70%), while 3% are higher than 180 (goal <25%), and 3% are lower than 70 (goal <4%).  The calculated average blood sugar is 117.  The projected HbA1c for the next 3 months ([facility]) is 6.1%. -Reviewing the CGM trends, sugars appear to be exemplary controlled throughout the day and night, with only occasional slightly low blood sugars in the last 2 weeks in the evening. Patient is worried that her blood sugars increased to 150 after protein shake or coffee.  This is actually an expected change and I would not recommend to necessarily bolus for this unless the sugars remain elevated for longer after the meal. Also, at night, his sugars are occasionally increasing after certain meals despite entering the generous amount of carbs.  I will advise him to try to bolus more in advance for such meals rich in carbs.  Carlus Pavlov, MD PhD [facility]

## 2022-06-25 ENCOUNTER — Encounter: Payer: Self-pay | Admitting: Family Medicine

## 2022-07-02 ENCOUNTER — Ambulatory Visit
Admission: RE | Admit: 2022-07-02 | Discharge: 2022-07-02 | Disposition: A | Discharge status: Home / Self Care | Payer: Medicare Other | Source: Ambulatory Visit | Attending: Urology | Admitting: Urology

## 2022-07-02 DIAGNOSIS — R972 Elevated prostate specific antigen [PSA]: Secondary | ICD-10-CM

## 2022-07-02 MED ORDER — GADOPICLENOL 0.5 MMOL/ML IV SOLN
6.0000 mL | Freq: Once | INTRAVENOUS | Status: AC | PRN
  Administered 2022-07-02: 6 mL via INTRAVENOUS

## 2022-08-06 ENCOUNTER — Telehealth: Payer: Self-pay

## 2022-08-06 NOTE — Telephone Encounter (Signed)
-----   Message from Nurse Shaitta H sent at 06/04/2022  9:06 AM EDT ----- LFTs continue to steadily improve Repeat LFTs in 2 months

## 2022-08-07 NOTE — Telephone Encounter (Signed)
The pt has been advised that he is due for repeat labs. Order has been entered

## 2022-08-08 ENCOUNTER — Other Ambulatory Visit (INDEPENDENT_AMBULATORY_CARE_PROVIDER_SITE_OTHER): Payer: Medicare Other

## 2022-08-08 ENCOUNTER — Other Ambulatory Visit: Payer: Self-pay

## 2022-08-08 DIAGNOSIS — R7989 Other specified abnormal findings of blood chemistry: Secondary | ICD-10-CM

## 2022-08-08 LAB — HEPATIC FUNCTION PANEL
ALT: 33 U/L (ref 0–53)
AST: 40 U/L — ABNORMAL HIGH (ref 0–37)
Albumin: 4.2 g/dL (ref 3.5–5.2)
Alkaline Phosphatase: 211 U/L — ABNORMAL HIGH (ref 39–117)
Bilirubin, Direct: 0.2 mg/dL (ref 0.0–0.3)
Total Bilirubin: 0.6 mg/dL (ref 0.2–1.2)
Total Protein: 6.6 g/dL (ref 6.0–8.3)

## 2022-08-20 ENCOUNTER — Other Ambulatory Visit: Payer: Self-pay | Admitting: Internal Medicine

## 2022-08-20 DIAGNOSIS — E119 Type 2 diabetes mellitus without complications: Secondary | ICD-10-CM

## 2022-08-29 ENCOUNTER — Other Ambulatory Visit: Payer: Self-pay | Admitting: Internal Medicine

## 2022-08-29 ENCOUNTER — Other Ambulatory Visit: Payer: Self-pay | Admitting: *Deleted

## 2022-08-29 ENCOUNTER — Encounter: Payer: Self-pay | Admitting: Internal Medicine

## 2022-08-29 ENCOUNTER — Other Ambulatory Visit: Payer: Self-pay | Admitting: Family Medicine

## 2022-08-29 DIAGNOSIS — E039 Hypothyroidism, unspecified: Secondary | ICD-10-CM

## 2022-08-29 DIAGNOSIS — E538 Deficiency of other specified B group vitamins: Secondary | ICD-10-CM

## 2022-09-06 ENCOUNTER — Encounter (INDEPENDENT_AMBULATORY_CARE_PROVIDER_SITE_OTHER): Payer: Self-pay

## 2022-09-10 ENCOUNTER — Ambulatory Visit (INDEPENDENT_AMBULATORY_CARE_PROVIDER_SITE_OTHER): Payer: Medicare Other | Admitting: Internal Medicine

## 2022-09-10 ENCOUNTER — Encounter: Payer: Self-pay | Admitting: Internal Medicine

## 2022-09-10 VITALS — BP 162/8 | HR 67 | Ht 68.0 in | Wt 137.0 lb

## 2022-09-10 DIAGNOSIS — Z794 Long term (current) use of insulin: Secondary | ICD-10-CM | POA: Diagnosis not present

## 2022-09-10 DIAGNOSIS — E119 Type 2 diabetes mellitus without complications: Secondary | ICD-10-CM | POA: Diagnosis not present

## 2022-09-10 DIAGNOSIS — Z7984 Long term (current) use of oral hypoglycemic drugs: Secondary | ICD-10-CM

## 2022-09-10 DIAGNOSIS — Z7985 Long-term (current) use of injectable non-insulin antidiabetic drugs: Secondary | ICD-10-CM

## 2022-09-10 DIAGNOSIS — E039 Hypothyroidism, unspecified: Secondary | ICD-10-CM

## 2022-09-10 LAB — HEMOGLOBIN A1C: Hemoglobin A1C: 5.8

## 2022-09-10 MED ORDER — OZEMPIC (0.25 OR 0.5 MG/DOSE) 2 MG/3ML ~~LOC~~ SOPN: PEN_INJECTOR | SUBCUTANEOUS | 3 refills | Status: DC

## 2022-09-10 NOTE — Progress Notes (Signed)
Patient ID: David Maynard, male   DOB: May 18, 1950, 72 y.o.   MRN: [id number]  HPI: David Maynard is a 72 y.o.-year-old male, returning for follow-up for DM2, diagnosed in 2008, on insulin since 2021, controlled, with complications (CAD).  He previously saw Dr. Everardo All, but last visit with me 6 months ago.  Interim history: No increased urination, blurry vision, nausea, chest pain. He had a penile implant 02/2022 and he was started on Bactrim afterwards.  He developed kidney dysfunction and elevated LFTs: alk phos in the 700s and also increased AST and ALT.  These are improving.  Reviewed HbA1c levels: Lab Results  Component Value Date   HGBA1C 6.0 (A) 02/26/2022   HGBA1C 6.0 (A) 01/02/2022   HGBA1C 5.7 (A) 09/11/2021   Insulin pump:  -Omnipod 5  CGM: -Dexcom G6 but would like to switch to G7 when available  Insulin: -Humalog (did not work well for him) >> Lispro generic  Supplies: -Dexcom from [employer] -Omnipod from [employer] >> now [employer] [facility] b/c better price  Pump settings: - basal rates: 12 am: 0.25 units/h 5 am: 0.45 7 am: 0.3 - ICR: 1:6 >> 1:7 - target: 110 >> 120 except 7 am to 11 pm: 110 - ISF: 1:40 - Active insulin time: 4h >> 2h - extended bolusing: not using - changes infusion site: q3 days TDD from basal insulin: 8.6 >> 10.3 units (28%) >> 35% >> 39% >> 28% TDD from bolus insulin: 22.7 >> 26.4 units (72%) >> 65% >> 61% >> 72% Total daily dose: 31-50 units /day  He is also on: -  >> stopped 08/2021 - Victoza 1.8 mg in a.m. >> Ozempic 0.5 mg weekly - Metformin ER 1000 mg 2x a day >> 2000 mg with dinner  Meter: Accu-Chek guide  Pt checks his sugars more than 4 times a day:  Prev.:   Prev.:   Lowest sugar was 54 >> 50s >> 52 (CGM); he has hypoglycemia awareness at 60.  He does not have a glucagon kit at home.  His wife is an Charity fundraiser and NP. Highest sugar was 200s >> 206 >> 254 >> 200s.  - no CKD, last BUN/creatinine:  Lab Results  Component Value Date    BUN 15 04/02/2022   BUN 23 03/19/2022   CREATININE 0.80 04/02/2022   CREATININE 0.72 03/19/2022   - + HL; last set of lipids: Lab Results  Component Value Date   CHOL 150 03/19/2022   HDL 89.70 03/19/2022   LDLCALC 42 03/19/2022   TRIG 90.0 03/19/2022   CHOLHDL 2 03/19/2022  On Lipitor 40 mg daily, Welchol 650 x 2 tablets twice a day, omega-3 fatty acids 2000 mg twice a day.  - last eye exam was 11/27/2021. No DR. Cataract sx 2021.  His vision improved significantly afterwards.  - no numbness and tingling in his feet.  Last foot exam 02/26/2022.  On ASA 81.  Last TSH as elevated: Lab Results  Component Value Date   TSH 6.83 (H) 03/19/2022  He believes that the TSH was high due to an ABx (Bactrim) >> GFR and LFTs abnormal.  He is on Levothyroxine 75 mcg daily. He takes this: - in am - fasting - + b'fast - + calcium twice a day - no iron currently - + multivitamins in a.m. - + PPIs in a.m. - not on Biotin   He has family history of type 1 diabetes - son.  He also has a history of ADD, B12 deficiency, BPH,  GERD, hypogonadism, osteoarthritis, depression, anemia..  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   and left   Thyroid disease    Unspecified hypothyroidism    Past Surgical History:  Procedure Laterality Date   COLONOSCOPY     COLONOSCOPY WITH PROPOFOL  02/28/2021   ESOPHAGOGASTRODUODENOSCOPY  06/16/2003   EYE SURGERY     INGUINAL HERNIA REPAIR      left   JOINT REPLACEMENT     KNEE ARTHROSCOPY     Left   KNEE ARTHROSCOPY W/ ACL RECONSTRUCTION     left   PENILE PROSTHESIS IMPLANT N/A 02/27/2022   Procedure: INSERTION OF INFLATABLE PENILE IMPLANT;  Surgeon: Despina Arias, MD;  Location: [facility];  Service: Urology;  Laterality: N/A;  90 MINUTES NEEDED FOR CASE   ROTATOR CUFF REPAIR     Right x2   Stress Cardiolite  07/15/2000   TOTAL KNEE ARTHROPLASTY Right 11/04/2017   TOTAL KNEE ARTHROPLASTY Right 11/04/2017   Procedure: TOTAL KNEE ARTHROPLASTY;  Surgeon: Salvatore Marvel, MD;  Location: [facility] OR;  Service: Orthopedics;  Laterality: Right;   TOTAL KNEE ARTHROPLASTY Left 12/23/2017   TOTAL KNEE ARTHROPLASTY Left 12/23/2017   Procedure: TOTAL KNEE ARTHROPLASTY;  Surgeon: Salvatore Marvel, MD;  [facility];  Service: Orthopedics;  Laterality: Left;   VASECTOMY  05/2002   Social History   Socioeconomic History   Marital status: Married    Spouse name: Not on file   Number of children: 2   Years of education: Not on file   Highest education level: Not on file  Occupational History   Occupation: Retired Water engineer for [employer]  Tobacco Use   Smoking status: Never   Smokeless tobacco: Never  Vaping Use   Vaping status: Never Used  Substance and Sexual Activity   Alcohol use: Yes    Alcohol/week: 1.0 standard drink of alcohol    Types: 1 Cans of beer per week    Comment: weekly   Drug use: No   Sexual activity: Not on file  Other Topics Concern   Not on file  Social History Narrative   Not on file   Social Determinants of Health   Financial Resource Strain: Low Risk  (10/09/2021)   Overall Financial Resource Strain (CARDIA)    Difficulty of Paying Living Expenses: Not hard at all  Food Insecurity: No Food Insecurity (02/27/2022)   Hunger Vital Sign    Worried About Running Out of Food in the Last Year: Never true    Ran Out of Food in the Last Year: Never true  Transportation Needs: No Transportation Needs  (02/27/2022)   PRAPARE - Administrator, Civil Service (Medical): No    Lack of Transportation (Non-Medical): No  Physical Activity: Sufficiently Active (10/09/2021)   Exercise Vital Sign    Days of Exercise per Week: 5 days    Minutes  of Exercise per Session: 120 min  Stress: No Stress Concern Present (10/09/2021)   [employer] of Occupational Health - Occupational Stress Questionnaire    Feeling of Stress : Not at all  Social Connections: Socially Integrated (10/09/2021)   Social Connection and Isolation Panel [NHANES]    Frequency of Communication with Friends and Family: More than three times a week    Frequency of Social Gatherings with Friends and Family: More than three times a week    Attends Religious Services: More than 4 times per year    Active Member of [employer] or Organizations: Yes    Attends Banker Meetings: 1 to 4 times per year    Marital Status: Married  Catering manager Violence: Not At Risk (02/27/2022)   Humiliation, Afraid, Rape, and Kick questionnaire    Fear of Current or Ex-Partner: No    Emotionally Abused: No    Physically Abused: No    Sexually Abused: No   Current Outpatient Medications on File Prior to Visit  Medication Sig Dispense Refill   ascorbic acid (VITAMIN C) 500 MG tablet Take 1 tablet (500 mg total) by mouth daily. 20 tablet 0   aspirin EC 81 MG tablet Take 81 mg by mouth daily. Swallow whole.     atorvastatin (LIPITOR) 40 MG tablet TAKE 1 TABLET BY MOUTH DAILY 90 tablet 3   Blood Glucose Monitoring Suppl (ACCU-CHEK GUIDE ME) w/Device KIT USE AS DIRECTED 1 kit 0   Calcium Carbonate (CALCIUM 600 PO) Take 1 tablet by mouth daily.     Cinnamon 500 MG capsule Take 3,000 mg by mouth 2 (two) times daily.     clomiPHENE (CLOMID) 50 MG tablet TAKE ONE-HALF TABLET BY  MOUTH DAILY 45 tablet 3   colesevelam (WELCHOL) 625 MG tablet Take 2 tablets (1,250 mg total) by mouth 2 (two) times daily with a meal. 180 tablet 1   Continuous  Blood Gluc Sensor (DEXCOM G6 SENSOR) MISC 1 Device by Does not apply route See admin instructions. 1 every 10 days 9 each 3   cyanocobalamin (VITAMIN B12) 1000 MCG/ML injection INJECT INTRAMUSCULARLY EVERY 30 DAYS (DISCARD 28 DAYS AFTER  FIRST USE) 3 mL 0   docusate sodium (COLACE) 100 MG capsule Take 100 mg by mouth 2 (two) times daily. Takes daily     Glucagon 3 MG/DOSE POWD Place 3 mg into the nose once as needed for up to 1 dose. 1 each 11   Glucosamine-Chondroit-Vit C-Mn (GLUCOSAMINE CHONDR 1500 COMPLX PO) Take 2 tablets by mouth daily.     glucose blood (ACCU-CHEK GUIDE) test strip USE TO CHECK BLOOD SUGAR 6  TIMES DAILY 600 strip 3   Insulin Disposable Pump (OMNIPOD 5 G6 POD, GEN 5,) MISC Change pod every 72 hours. 10 each 10   insulin lispro (HUMALOG) 100 UNIT/ML cartridge Inject 0.5 mLs (50 Units total) into the skin daily. Via pump 45 mL 3   insulin lispro (HUMALOG) 100 UNIT/ML injection For use in pump, total of 50 units per day. 50 mL 3   insulin lispro (HUMALOG) 100 UNIT/ML injection Inject 0.5 mLs (50 Units total) into the skin daily. Via pump 50 mL 3   Insulin Lispro-aabc 100 UNIT/ML SOLN Inject 50 Units as directed daily. Via pump 50 mL 1   Insulin Pen Needle 31G X 5 MM MISC 1 Device by Does not apply route in the morning and at bedtime. 200 each 3   levothyroxine (SYNTHROID) 75 MCG tablet TAKE 1 TABLET  BY MOUTH DAILY 90 tablet 0   metFORMIN (GLUCOPHAGE-XR) 500 MG 24 hr tablet TAKE 2 TABLETS BY MOUTH TWICE  DAILY 360 tablet 3   Multiple Vitamin (MULTIVITAMIN) tablet Take 1 tablet by mouth daily.     MYRBETRIQ 50 MG TB24 tablet Take 50 mg by mouth daily.     omega-3 acid ethyl esters (LOVAZA) 1 g capsule TAKE 2 CAPSULES BY MOUTH TWICE  DAILY 360 capsule 3   Semaglutide,0.25 or 0.5MG /DOS, (OZEMPIC, 0.25 OR 0.5 MG/DOSE,) 2 MG/3ML SOPN INJECT SUBCUTANEOUSLY 0.5 MG  EVERY WEEK 9 mL 0   SYRINGE-NEEDLE, DISP, 3 ML (B-D 3CC LUER-LOK SYR 25GX1") 25G X 1" 3 ML MISC USE AS DIRECTED MONTHLY  FOR B-12 INJECTIONS 3 each 0   tadalafil (ADCIRCA/CIALIS) 20 MG tablet Take 20 mg by mouth daily as needed for erectile dysfunction.     terbinafine (LAMISIL) 250 MG tablet Take 1 tablet (250 mg total) by mouth daily. (Patient not taking: Reported on 05/02/2022) 90 tablet 0   Trospium Chloride 60 MG CP24 Take 60 mg by mouth daily.     Turmeric 500 MG CAPS Take 500-1,000 mg by mouth daily. May take an extra dose if plays golf or tennis     VITAMIN D, CHOLECALCIFEROL, PO Take 1 tablet by mouth daily.     vitamin E 200 UNIT capsule Take 200 Units by mouth daily.     No current facility-administered medications on file prior to visit.   Allergies  Allergen Reactions   Bactrim [Sulfamethoxazole-Trimethoprim]    Family History  Problem Relation Age of Onset   Hypertension Mother    Kidney disease Father    Breast cancer Neg Hx    Celiac disease Neg Hx    Cirrhosis Neg Hx    Clotting disorder Neg Hx    Colitis Neg Hx    Colon cancer Neg Hx    Colon polyps Neg Hx    Crohn's disease Neg Hx    Cystic fibrosis Neg Hx    Diabetes Neg Hx    Esophageal cancer Neg Hx    Heart disease Neg Hx    Hemochromatosis Neg Hx    Inflammatory bowel disease Neg Hx    Irritable bowel syndrome Neg Hx    Liver cancer Neg Hx    Liver disease Neg Hx    Ovarian cancer Neg Hx    Pancreatic cancer Neg Hx    Prostate cancer Neg Hx    Rectal cancer Neg Hx    Stomach cancer Neg Hx    Ulcerative colitis Neg Hx    Uterine cancer Neg Hx    Wilson's disease Neg Hx    PE: BP (!) 162/8   Pulse 67   Ht 5\' 8"  (1.727 m)   Wt 137 lb (62.1 kg)   SpO2 99%   BMI 20.83 kg/m  Wt Readings from Last 3 Encounters:  09/10/22 137 lb (62.1 kg)  05/02/22 140 lb (63.5 kg)  03/19/22 140 lb 9.6 oz (63.8 kg)   Constitutional: normal weight, in NAD Eyes: EOMI, no exophthalmos ENT:  no thyromegaly, no cervical lymphadenopathy Cardiovascular: RRR, No MRG Respiratory: CTA B Musculoskeletal: no deformities Skin: no  rashes Neurological: no tremor with outstretched hands  ASSESSMENT: 1. DM2, controlled, with complications - CAD  2. HL  3.  Hypothyroidism  PLAN:  1. Patient with longstanding, actually with well-controlled type 1 diabetes, on insulin pump: OmniPod 5 integrated with the Dexcom G6 CGM.  At last visit, reviewing the  CGM trends, sugars appears to be well-controlled, with only an occasional hyperglycemic spike after lunch and dinner and lower blood sugars, occasionally lower than 70 at night.  Usually prefers to keep his blood sugars very low within the low normal range.  However, at that time, I recommended to relax  his insulin to carb ratio with dinner to avoid blood sugar drops during the night.  He was also frustrated that his insulin boluses were going very slowly and the sugars remained high after meals.  He was interested in reducing his active insulin time and we did this.  I advised him to pay attention to insulin stacking. -He contacted [place] since last visit (05/2022): Sugars appears to be exemplary control throughout the day and night with only occasional slightly low blood sugars in the previous 2 weeks, in the evening.  He was worried that his blood sugars were increasing to 150 after protein shake or coffee.  We discussed that this was an expected change and I did not necessarily recommend to bolus for these but sugars were occasionally increasing after certain meals despite entering generous amount of carbs so I advised him to try to bolus more in advance for such meals.. CGM interpretation: -At today's visit, we reviewed his CGM downloads: It appears that 98% of values are in target range (goal >70%), while 0% are higher than 180 (goal <25%), and 2% are lower than 70 (goal <4%).  The calculated average blood sugar is 112.  The projected HbA1c for the next 3 months (GMI) is 6.0%. -Reviewing the CGM trends, sugars appear to be well-controlled with only an occasional mild hypoglycemic value  around 6 PM and then again after dinner.  Upon questioning, he is frequently quite aggressive with his insulin doses and sometimes he has to correct his lows with protein bars.  We discussed about trying to be less aggressive (he is usually bolusing more than the pump is calculating for him) and follow the pump instructions.  Otherwise, I did not feel he needed a change in his regimen. I advised him to: Patient Instructions  Please use the following pump settings: - basal rates: 12 am: 0.25 units/h 5 am: 0.45 7 am: 0.3 - ICR: 1:6, but ICR for dinner (6 pm to 12 am): 1:7 - target: 110. Try 120 if the sugars are still dropping overnight. - ISF: 1:40 - Active insulin time: 2h  Please continue: - Metformin ER 2000 mg daily - Ozempic 0.5 mg weekly  Please return in ~6 months  - we checked his HbA1c: 5.8% (lower) - advised to check sugars at different times of the day - 4x a day, rotating check times - advised for yearly eye exams >> he is UTD - return to clinic in 6 months  2. HL -Reviewed lipid panel: Fractions at goal: Lab Results  Component Value Date   CHOL 150 03/19/2022   HDL 89.70 03/19/2022   LDLCALC 42 03/19/2022   TRIG 90.0 03/19/2022   CHOLHDL 2 03/19/2022  -He is on Lipitor 40 mg daily, WelChol 1300 mg twice a day, 3 fatty acids 2000 mg twice a day.  No side effects from these.  3.  Hypothyroidism -Previously managed by PCP - latest thyroid labs reviewed with pt. >> TSH is elevated: Lab Results  Component Value Date   TSH 6.83 (H) 03/19/2022  - he continues on LT4 75 mcg daily - pt feels good on this dose. - we discussed about taking the thyroid hormone every day,  with water, >30 minutes before breakfast, separated by >4 hours from acid reflux medications, calcium, iron, multivitamins. Pt. is not taking it correctly.  He takes it in the morning but eats soon after and he also takes his acid reflux medicine, calcium, and multivitamins around that time.  I advised him  to take the levothyroxine by itself, separated by the rest of the medications.  Discussed about moving calcium, multivitamins, and acid reflux medicines at night.  We will then check his TFTs again: TSH and fT4 - in 1.5 mo - If labs are abnormal, he will need to return for repeat TFTs in 1.5 months  Orders Placed This Encounter  Procedures   TSH   T4, free   Carlus Pavlov, MD PhD [facility] Endocrinology

## 2022-09-10 NOTE — Patient Instructions (Addendum)
Please use the following pump settings: - basal rates: 12 am: 0.25 units/h 5 am: 0.45 7 am: 0.3 - ICR: 1:7 - target: 120 except 7 am to 11 pm: 110 - ISF: 1:40 - Active insulin time: 2h  Please continue: - Metformin ER 2000 mg daily - Ozempic 0.5 mg weekly  Please continue Levothyroxine 75 mcg daily.  Take the thyroid hormone every day, with water, at least 30 minutes before breakfast or the protein shake, separated by at least 4 hours from: - acid reflux medications - calcium - iron - multivitamins    Please come back in 1.5 months for thyroid check. Please return in ~6 months

## 2022-09-17 ENCOUNTER — Other Ambulatory Visit: Payer: Self-pay | Admitting: Family Medicine

## 2022-09-23 ENCOUNTER — Encounter: Payer: Self-pay | Admitting: Internal Medicine

## 2022-09-23 DIAGNOSIS — E119 Type 2 diabetes mellitus without complications: Secondary | ICD-10-CM

## 2022-09-24 ENCOUNTER — Other Ambulatory Visit: Payer: Self-pay | Admitting: Family Medicine

## 2022-09-24 ENCOUNTER — Other Ambulatory Visit: Payer: Self-pay | Admitting: Internal Medicine

## 2022-09-24 DIAGNOSIS — E039 Hypothyroidism, unspecified: Secondary | ICD-10-CM

## 2022-09-25 MED ORDER — LANCETS 30G MISC: 0 refills | Status: DC

## 2022-09-25 MED ORDER — GLUCAGON 3 MG/DOSE NA POWD: 3.0000 mg | Freq: Once | NASAL | 11 refills | Status: AC | PRN

## 2022-09-25 MED ORDER — ACCU-CHEK GUIDE ME W/DEVICE KIT: PACK | 0 refills | Status: DC

## 2022-09-25 MED ORDER — ACCU-CHEK GUIDE VI STRP
ORAL_STRIP | 0 refills | Status: DC
Start: 2022-09-25 — End: 2022-12-17

## 2022-09-30 ENCOUNTER — Encounter: Payer: Self-pay | Admitting: Family Medicine

## 2022-10-03 ENCOUNTER — Other Ambulatory Visit (INDEPENDENT_AMBULATORY_CARE_PROVIDER_SITE_OTHER): Payer: Medicare Other

## 2022-10-03 ENCOUNTER — Other Ambulatory Visit: Payer: Medicare Other

## 2022-10-03 DIAGNOSIS — E538 Deficiency of other specified B group vitamins: Secondary | ICD-10-CM | POA: Diagnosis not present

## 2022-10-03 DIAGNOSIS — R7989 Other specified abnormal findings of blood chemistry: Secondary | ICD-10-CM | POA: Diagnosis not present

## 2022-10-03 DIAGNOSIS — E039 Hypothyroidism, unspecified: Secondary | ICD-10-CM | POA: Diagnosis not present

## 2022-10-03 LAB — HEPATIC FUNCTION PANEL
ALT: 38 U/L (ref 0–53)
AST: 43 U/L — ABNORMAL HIGH (ref 0–37)
Albumin: 3.9 g/dL (ref 3.5–5.2)
Alkaline Phosphatase: 258 U/L — ABNORMAL HIGH (ref 39–117)
Bilirubin, Direct: 0.1 mg/dL (ref 0.0–0.3)
Total Bilirubin: 0.5 mg/dL (ref 0.2–1.2)
Total Protein: 6.6 g/dL (ref 6.0–8.3)

## 2022-10-03 LAB — TSH: TSH: 9.74 u[IU]/mL — ABNORMAL HIGH (ref 0.35–5.50)

## 2022-10-03 LAB — T4, FREE: Free T4: 0.91 ng/dL (ref 0.60–1.60)

## 2022-10-03 LAB — VITAMIN B12: Vitamin B-12: 951 pg/mL — ABNORMAL HIGH (ref 211–911)

## 2022-10-04 ENCOUNTER — Encounter: Payer: Self-pay | Admitting: Internal Medicine

## 2022-10-04 ENCOUNTER — Other Ambulatory Visit: Payer: Self-pay | Admitting: Internal Medicine

## 2022-10-04 ENCOUNTER — Encounter: Payer: Self-pay | Admitting: Gastroenterology

## 2022-10-04 DIAGNOSIS — E039 Hypothyroidism, unspecified: Secondary | ICD-10-CM

## 2022-10-04 MED ORDER — LEVOTHYROXINE SODIUM 88 MCG PO TABS
88.0000 ug | ORAL_TABLET | Freq: Every day | ORAL | 3 refills | Status: DC
Start: 2022-10-04 — End: 2022-10-05

## 2022-10-05 ENCOUNTER — Other Ambulatory Visit: Payer: Self-pay

## 2022-10-05 ENCOUNTER — Encounter: Payer: Self-pay | Admitting: Internal Medicine

## 2022-10-05 DIAGNOSIS — R7989 Other specified abnormal findings of blood chemistry: Secondary | ICD-10-CM

## 2022-10-05 DIAGNOSIS — E039 Hypothyroidism, unspecified: Secondary | ICD-10-CM

## 2022-10-05 MED ORDER — LEVOTHYROXINE SODIUM 88 MCG PO TABS: 88.0000 ug | ORAL_TABLET | Freq: Every day | ORAL | 1 refills | Status: DC

## 2022-10-05 NOTE — Progress Notes (Signed)
B12 level is at goal.  He can continue with maintenance therapy 1000 mcg daily.  We can recheck in 6 to 12 months.

## 2022-10-23 ENCOUNTER — Encounter: Payer: Self-pay | Admitting: Family Medicine

## 2022-10-24 NOTE — Telephone Encounter (Signed)
It is a good idea for him to get this. He can go to the pharmacy for this.  Katina Degree. Jimmey Ralph, MD 10/24/2022 8:31 AM

## 2022-10-24 NOTE — Telephone Encounter (Signed)
Please advise 

## 2022-10-29 ENCOUNTER — Other Ambulatory Visit (INDEPENDENT_AMBULATORY_CARE_PROVIDER_SITE_OTHER): Payer: Medicare Other

## 2022-10-29 ENCOUNTER — Encounter: Payer: Self-pay | Admitting: Internal Medicine

## 2022-10-29 DIAGNOSIS — E039 Hypothyroidism, unspecified: Secondary | ICD-10-CM

## 2022-10-29 LAB — T4, FREE: Free T4: 0.78 ng/dL (ref 0.60–1.60)

## 2022-10-29 LAB — TSH: TSH: 6 u[IU]/mL — ABNORMAL HIGH (ref 0.35–5.50)

## 2022-11-05 ENCOUNTER — Ambulatory Visit (INDEPENDENT_AMBULATORY_CARE_PROVIDER_SITE_OTHER): Payer: Medicare Other

## 2022-11-05 VITALS — Wt 137.0 lb

## 2022-11-05 DIAGNOSIS — Z0000 Encounter for general adult medical examination without abnormal findings: Secondary | ICD-10-CM

## 2022-11-05 NOTE — Patient Instructions (Signed)
David Maynard , Thank you for taking time to come for your Medicare Wellness Visit. I appreciate your ongoing commitment to your health goals. Please review the following plan we discussed and let me know if I can assist you in the future.   Referrals/Orders/Follow-Ups/Clinician Recommendations: keep a1c Down  This is a list of the screening recommended for you and due dates:  Health Maintenance  Topic Date Due   Yearly kidney health urinalysis for diabetes  03/13/2022   Flu Shot  08/23/2022   Hemoglobin A1C  08/27/2022   Eye exam for diabetics  11/28/2022   Complete foot exam   02/27/2023   Yearly kidney function blood test for diabetes  04/02/2023   Medicare Annual Wellness Visit  11/05/2023   DTaP/Tdap/Td vaccine (2 - Td or Tdap) 11/10/2023   Colon Cancer Screening  03/01/2031   Pneumonia Vaccine  Completed   COVID-19 Vaccine  Completed   Hepatitis C Screening  Completed   Zoster (Shingles) Vaccine  Completed   HPV Vaccine  Aged Out    Advanced directives: (In Chart) A copy of your advanced directives are scanned into your chart should your provider ever need it.  Next Medicare Annual Wellness Visit scheduled for next year: Yes

## 2022-11-05 NOTE — Progress Notes (Signed)
Subjective:   David Maynard is a 72 y.o. male who presents for Medicare Annual/Subsequent preventive examination.  Visit Complete: Virtual I connected with  David Maynard on 11/05/22 by a audio enabled telemedicine application and verified that I am speaking with the correct person using two identifiers.  Patient Location: Home  Provider Location: Office/Clinic  I discussed the limitations of evaluation and management by telemedicine. The patient expressed understanding and agreed to proceed.  Vital Signs: Because this visit was a virtual/telehealth visit, some criteria may be missing or patient reported. Any vitals not documented were not able to be obtained and vitals that have been documented are patient reported.   Because this visit was a virtual/telehealth visit, some criteria may be missing or patient reported. Any vitals not documented were not able to be obtained and vitals that have been documented are patient reported.    Cardiac Risk Factors include: advanced age (>36men, >14 women);dyslipidemia;male gender;diabetes mellitus     Objective:    Today's Vitals   11/05/22 1407  Weight: 137 lb (62.1 kg)   Body mass index is 20.83 kg/m.     11/05/2022    2:15 PM 02/27/2022    8:15 AM 02/21/2022   10:34 AM 10/09/2021    3:02 PM 09/12/2020   11:20 AM 09/28/2019    6:00 PM 09/26/2019    9:40 PM  Advanced Directives  Does Patient Have a Medical Advance Directive? Yes Yes Yes Yes Yes No No  Type of Estate agent of [place];Living will Healthcare Power of [place];Living will Healthcare Power of [place];Living will Healthcare Power of [place];Living will Healthcare Power of Attorney    Does patient want to make changes to medical advance directive? No - Patient declined No - Patient declined  No - Patient declined     Copy of Healthcare Power of Attorney in Chart? Yes - validated most recent copy scanned in chart (See row information) No - copy requested   Yes - validated most recent copy scanned in chart (See row information) Yes - validated most recent copy scanned in chart (See row information)    Would patient like information on creating a medical advance directive?       No - Patient declined    Current Medications (verified) Outpatient Encounter Medications as of 11/05/2022  Medication Sig   ascorbic acid (VITAMIN C) 500 MG tablet Take 1 tablet (500 mg total) by mouth daily.   aspirin EC 81 MG tablet Take 81 mg by mouth daily. Swallow whole.   atorvastatin (LIPITOR) 40 MG tablet TAKE 1 TABLET BY MOUTH DAILY   Blood Glucose Monitoring Suppl (ACCU-CHEK GUIDE ME) w/Device KIT Use to check blood sugar 6 times a day   Calcium Carbonate (CALCIUM 600 PO) Take 1 tablet by mouth daily.   Cinnamon 500 MG capsule Take 3,000 mg by mouth 2 (two) times daily.   colesevelam (WELCHOL) 625 MG tablet Take 2 tablets (1,250 mg total) by mouth 2 (two) times daily with a meal.   Continuous Blood Gluc Sensor (DEXCOM G6 SENSOR) MISC 1 Device by Does not apply route See admin instructions. 1 every 10 days   docusate sodium (COLACE) 100 MG capsule Take 100 mg by mouth 2 (two) times daily. Takes daily   Glucagon 3 MG/DOSE POWD Place 3 mg into the nose once as needed for up to 1 dose.   Glucosamine-Chondroit-Vit C-Mn (GLUCOSAMINE CHONDR 1500 COMPLX PO) Take 2 tablets by mouth daily.   glucose blood (  ACCU-CHEK GUIDE) test strip USE TO CHECK BLOOD SUGAR 6  TIMES DAILY   Insulin Disposable Pump (OMNIPOD 5 G6 POD, GEN 5,) MISC Change pod every 72 hours.   insulin lispro (HUMALOG) 100 UNIT/ML cartridge Inject 0.5 mLs (50 Units total) into the skin daily. Via pump   Insulin Pen Needle 31G X 5 MM MISC 1 Device by Does not apply route in the morning and at bedtime.   Lancets 30G MISC Use to check blood sugar 6 times a day   levothyroxine (SYNTHROID) 88 MCG tablet Take 1 tablet (88 mcg total) by mouth daily before breakfast.   metFORMIN (GLUCOPHAGE-XR) 500 MG 24 hr tablet  TAKE 2 TABLETS BY MOUTH TWICE  DAILY   Multiple Vitamin (MULTIVITAMIN) tablet Take 1 tablet by mouth daily.   MYRBETRIQ 50 MG TB24 tablet Take 50 mg by mouth daily.   omega-3 acid ethyl esters (LOVAZA) 1 g capsule TAKE 2 CAPSULES BY MOUTH TWICE  DAILY   Semaglutide,0.25 or 0.5MG /DOS, (OZEMPIC, 0.25 OR 0.5 MG/DOSE,) 2 MG/3ML SOPN Inject under skin 0.5 mg weekly under skin   SYRINGE-NEEDLE, DISP, 3 ML (B-D 3CC LUER-LOK SYR 25GX1") 25G X 1" 3 ML MISC USE AS DIRECTED MONTHLY FOR B-12 INJECTIONS   tadalafil (ADCIRCA/CIALIS) 20 MG tablet Take 20 mg by mouth daily as needed for erectile dysfunction.   terbinafine (LAMISIL) 250 MG tablet Take 1 tablet (250 mg total) by mouth daily.   Trospium Chloride 60 MG CP24 Take 60 mg by mouth daily.   Turmeric 500 MG CAPS Take 500-1,000 mg by mouth daily. May take an extra dose if plays golf or tennis   VITAMIN D, CHOLECALCIFEROL, PO Take 1 tablet by mouth daily.   vitamin E 200 UNIT capsule Take 200 Units by mouth daily.   insulin lispro (HUMALOG) 100 UNIT/ML injection For use in pump, total of 50 units per day.   Insulin Lispro-aabc 100 UNIT/ML SOLN Inject 50 Units as directed daily. Via pump   [DISCONTINUED] clomiPHENE (CLOMID) 50 MG tablet TAKE ONE-HALF TABLET BY  MOUTH DAILY   [DISCONTINUED] cyanocobalamin (VITAMIN B12) 1000 MCG/ML injection INJECT INTRAMUSCULARLY EVERY 30 DAYS (DISCARD 28 DAYS AFTER  FIRST USE)   [DISCONTINUED] insulin lispro (HUMALOG) 100 UNIT/ML injection Inject 0.5 mLs (50 Units total) into the skin daily. Via pump   No facility-administered encounter medications on file as of 11/05/2022.    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   and left   Thyroid disease    Unspecified hypothyroidism    Past Surgical History:  Procedure Laterality Date   COLONOSCOPY     COLONOSCOPY WITH PROPOFOL  02/28/2021   ESOPHAGOGASTRODUODENOSCOPY  06/16/2003   EYE SURGERY     INGUINAL HERNIA REPAIR     left   JOINT REPLACEMENT     KNEE ARTHROSCOPY     Left   KNEE ARTHROSCOPY W/  ACL RECONSTRUCTION     left   PENILE PROSTHESIS IMPLANT N/A 02/27/2022   Procedure: INSERTION OF INFLATABLE PENILE IMPLANT;  Surgeon: Despina Arias, MD;  Location: [facility];  Service: Urology;  Laterality: N/A;  90 MINUTES NEEDED FOR CASE   ROTATOR CUFF REPAIR     Right x2   Stress Cardiolite  07/15/2000   TOTAL KNEE ARTHROPLASTY Right 11/04/2017   TOTAL KNEE ARTHROPLASTY Right 11/04/2017   Procedure: TOTAL KNEE ARTHROPLASTY;  Surgeon: Salvatore Marvel, MD;  Location: [facility] OR;  Service: Orthopedics;  Laterality: Right;   TOTAL KNEE ARTHROPLASTY Left 12/23/2017   TOTAL KNEE ARTHROPLASTY Left 12/23/2017   Procedure: TOTAL KNEE ARTHROPLASTY;  Surgeon: Salvatore Marvel, MD;  Location: [facility];  Service: Orthopedics;  Laterality: Left;   VASECTOMY  05/2002   Family History  Problem Relation Age of Onset   Hypertension Mother    Kidney disease Father    Breast cancer Neg Hx    Celiac disease Neg Hx    Cirrhosis Neg Hx    Clotting disorder Neg Hx    Colitis Neg Hx    Colon cancer Neg Hx    Colon polyps Neg Hx    Crohn's disease Neg Hx    Cystic fibrosis Neg Hx    Diabetes Neg Hx    Esophageal cancer Neg Hx    Heart disease Neg Hx    Hemochromatosis Neg Hx    Inflammatory bowel disease Neg Hx    Irritable bowel syndrome Neg Hx    Liver cancer Neg Hx    Liver disease Neg Hx     Ovarian cancer Neg Hx    Pancreatic cancer Neg Hx    Prostate cancer Neg Hx    Rectal cancer Neg Hx    Stomach cancer Neg Hx    Ulcerative colitis Neg Hx    Uterine cancer Neg Hx    Wilson's disease Neg Hx    Social History   Socioeconomic History   Marital status: Married    Spouse name: Not on file   Number of children: 2   Years of education: Not on file   Highest education level: Not on file  Occupational History   Occupation: Retired Water engineer for [employer]  Tobacco Use   Smoking status: Never   Smokeless tobacco: Never  Vaping Use   Vaping status: Never Used  Substance and Sexual Activity   Alcohol use: Yes    Alcohol/week: 1.0 standard drink of alcohol    Types: 1 Cans of beer per week    Comment: weekly   Drug use: No   Sexual activity: Not on file  Other Topics Concern   Not on file  Social History Narrative   Not on file   Social Determinants of Health   Financial Resource Strain: Low Risk  (11/05/2022)   Overall Financial Resource Strain (CARDIA)    Difficulty of Paying Living Expenses: Not hard at all  Food Insecurity: No Food Insecurity (11/05/2022)   Hunger Vital Sign    Worried About Running Out of Food in the Last Year: Never true    Ran Out of Food in the Last Year: Never true  Transportation Needs: No Transportation Needs (11/05/2022)   PRAPARE - Administrator, Civil Service (Medical): No    Lack of Transportation (Non-Medical): No  Physical Activity: Sufficiently Active (11/05/2022)   Exercise Vital Sign    Days of Exercise per Week: 5 days    Minutes  of Exercise per Session: 120 min  Stress: No Stress Concern Present (11/05/2022)   [employer] of Occupational Health - Occupational Stress Questionnaire    Feeling of Stress : Not at all  Social Connections: Socially Integrated (11/05/2022)   Social Connection and Isolation Panel [NHANES]    Frequency of Communication with Friends and Family: More than three times a  week    Frequency of Social Gatherings with Friends and Family: More than three times a week    Attends Religious Services: More than 4 times per year    Active Member of [employer] or Organizations: Yes    Attends Banker Meetings: 1 to 4 times per year    Marital Status: Married    Tobacco Counseling Counseling given: Not Answered   Clinical Intake:  Pre-visit preparation completed: Yes  Pain : No/denies pain     BMI - recorded: 20.83 Nutritional Status: BMI of 19-24  Normal Nutritional Risks: None Diabetes: Yes CBG done?: Yes (146 per pt) CBG resulted in Enter/ Edit results?: No Did pt. bring in CBG monitor from home?: No  How often do you need to have someone help you when you read instructions, pamphlets, or other written materials from your doctor or pharmacy?: 1 - Never  Interpreter Needed?: No  Information entered by :: Lanier Ensign, LPN   Activities of Daily Living    11/05/2022    2:09 PM 02/27/2022    7:09 PM  In your present state of health, do you have any difficulty performing the following activities:  Hearing? 1   Comment hearing aids   Vision? 0   Difficulty concentrating or making decisions? 0   Walking or climbing stairs? 0   Dressing or bathing? 0   Doing errands, shopping? 0 0  Preparing Food and eating ? N   Using the Toilet? N   In the past six months, have you accidently leaked urine? Y   Comment at times   Do you have problems with loss of bowel control? N   Managing your Medications? N   Managing your Finances? N   Housekeeping or managing your Housekeeping? N     Patient Care Team: Ardith Dark, MD as PCP - General (Family Medicine) Barnett Abu, MD as Attending Physician (Neurosurgery) Romero Belling, MD (Inactive) as Consulting Physician (Endocrinology) Salvatore Marvel, MD as Consulting Physician (Orthopedic Surgery) Clois Dupes, DMD as Consulting Physician (Dentistry) [facility] as  Consulting Physician (Orthopedic Surgery) Hillery Aldo, NP as Nurse Practitioner (Nurse Practitioner) Nelson Chimes, MD as Consulting Physician (Ophthalmology)  Indicate any recent Medical Services you may have received from other than [facility] providers in the past year (date may be approximate).     Assessment:   This is a routine wellness examination for [place].  Hearing/Vision screen Hearing Screening - Comments:: Pt has hearing aids  Vision Screening - Comments:: Pt follows up with Dr Shawna Orleans for annual eye exam    Goals Addressed             This Visit's Progress    Patient Stated       Keep A1C down        Depression Screen    11/05/2022    2:15 PM 03/19/2022    1:14 PM 01/29/2022    2:30 PM 10/09/2021    3:00 PM 09/12/2020   11:20 AM 09/03/2019   11:09 AM 09/02/2018   11:17 AM  PHQ 2/9 Scores  PHQ -  2 Score 0 0 0 0 0 0 0    Fall Risk    11/05/2022    2:17 PM 03/19/2022    1:14 PM 01/29/2022    2:30 PM 10/09/2021    3:02 PM 09/12/2020   11:22 AM  Fall Risk   Falls in the past year? 0 0 0 0 0  Number falls in past yr: 0 0 0 0 0  Injury with Fall? 0 0 0 0 0  Risk for fall due to : No Fall Risks No Fall Risks No Fall Risks No Fall Risks   Follow up Falls prevention discussed   Falls prevention discussed Falls prevention discussed    MEDICARE RISK AT HOME: Medicare Risk at Home Any stairs in or around the home?: Yes If so, are there any without handrails?: No Home free of loose throw rugs in walkways, pet beds, electrical cords, etc?: Yes Adequate lighting in your home to reduce risk of falls?: Yes Life alert?: No Use of a cane, walker or w/c?: No Grab bars in the bathroom?: Yes Shower chair or bench in shower?: No Elevated toilet seat or a handicapped toilet?: No  TIMED UP AND GO:  Was the test performed?  No    Cognitive Function:        11/05/2022    2:20 PM 10/09/2021    3:04 PM 09/12/2020   11:32 AM 09/03/2019   11:16 AM 09/02/2018    11:20 AM  6CIT Screen  What Year? 0 points 0 points 0 points 0 points 0 points  What month? 0 points 0 points 0 points 0 points 0 points  What time? 0 points 0 points 0 points  0 points  Count back from 20 0 points 0 points 0 points 0 points 0 points  Months in reverse 0 points 0 points 0 points 0 points 0 points  Repeat phrase 0 points 0 points 0 points 0 points 0 points  Total Score 0 points 0 points 0 points  0 points    Immunizations Immunization History  Administered Date(s) Administered   Fluad Quad(high Dose 65+) 12/08/2018, 10/26/2019, 11/21/2020, 10/09/2021   Fluzone Influenza virus vaccine,trivalent (IIV3), split virus 11/26/2006   H1N1 12/27/2007   Influenza Split 12/10/2011   Influenza Whole 11/22/2009   Influenza, High Dose Seasonal PF 10/13/2015, 12/03/2016, 10/22/2017   Influenza, Seasonal, Injecte, Preservative Fre 11/08/2009, 11/29/2010   Influenza,inj,Quad PF,6+ Mos 12/04/2011, 11/12/2012, 11/10/2013   Influenza-Unspecified 11/10/2013   Moderna Sars-Covid-2 Vaccination 01/14/2019, 02/12/2019   Novel Infuenza-h1n1-09 12/27/2007   PFIZER(Purple Top)SARS-COV-2 Vaccination 12/28/2019   Pfizer Covid-19 Vaccine Bivalent Booster 57yrs & up 05/23/2021   Pfizer(Comirnaty)Fall Seasonal Vaccine 12 years and older 04/01/2022   Pneumococcal Conjugate-13 11/28/2015   Pneumococcal Polysaccharide-23 03/29/2009, 12/03/2016   Tdap 11/29/2010, 11/09/2013   Unspecified SARS-COV-2 Vaccination 12/25/2020, 10/29/2021   Zoster Recombinant(Shingrix) 05/28/2016, 09/03/2016   Zoster, Live 01/22/2010, 04/02/2011    TDAP status: Up to date  Flu Vaccine status: Due, Education has been provided regarding the importance of this vaccine. Advised may receive this vaccine at local pharmacy or Health Dept. Aware to provide a copy of the vaccination record if obtained from local pharmacy or Health Dept. Verbalized acceptance and understanding.  Pneumococcal vaccine status: Up to date  Covid-19  vaccine status: Completed vaccines  Qualifies for Shingles Vaccine? Yes   Zostavax completed Yes   Shingrix Completed?: Yes  Screening Tests Health Maintenance  Topic Date Due   Diabetic kidney evaluation - Urine ACR  03/13/2022  HEMOGLOBIN A1C  08/27/2022   INFLUENZA VACCINE  04/22/2023 (Originally 08/23/2022)   OPHTHALMOLOGY EXAM  11/28/2022   FOOT EXAM  02/27/2023   Diabetic kidney evaluation - eGFR measurement  04/02/2023   Medicare Annual Wellness (AWV)  11/05/2023   DTaP/Tdap/Td (3 - Td or Tdap) 11/10/2023   Colonoscopy  03/01/2031   Pneumonia Vaccine 60+ Years old  Completed   COVID-19 Vaccine  Completed   Hepatitis C Screening  Completed   Zoster Vaccines- Shingrix  Completed   HPV VACCINES  Aged Out    Health Maintenance  Health Maintenance Due  Topic Date Due   Diabetic kidney evaluation - Urine ACR  03/13/2022   HEMOGLOBIN A1C  08/27/2022    Colorectal cancer screening: Type of screening: Colonoscopy. Completed 02/28/21. Repeat every 10 years   Additional Screening:  Hepatitis C Screening: Completed 11/28/15  Vision Screening: Recommended annual ophthalmology exams for early detection of glaucoma and other disorders of the eye. Is the patient up to date with their annual eye exam?  Yes  Who is the provider or what is the name of the office in which the patient attends annual eye exams? Dr Shawna Orleans  If pt is not established with a provider, would they like to be referred to a provider to establish care? No .   Dental Screening: Recommended annual dental exams for proper oral hygiene  Diabetic Foot Exam: Diabetic Foot Exam: Completed 02/26/22  Community Resource Referral / Chronic Care Management: CRR required this visit?  No   CCM required this visit?  No     Plan:     I have personally reviewed and noted the following in the patient's chart:   Medical and social history Use of alcohol, tobacco or illicit drugs  Current medications and  supplements including opioid prescriptions. Patient is not currently taking opioid prescriptions. Functional ability and status Nutritional status Physical activity Advanced directives List of other physicians Hospitalizations, surgeries, and ER visits in previous 12 months Vitals Screenings to include cognitive, depression, and falls Referrals and appointments  In addition, I have reviewed and discussed with patient certain preventive protocols, quality metrics, and best practice recommendations. A written personalized care plan for preventive services as well as general preventive health recommendations were provided to patient.     Marzella Schlein, LPN   16/10/9602   After Visit Summary: (MyChart) Due to this being a telephonic visit, the after visit summary with patients personalized plan was offered to patient via MyChart   Nurse Notes: none

## 2022-11-25 ENCOUNTER — Encounter (INDEPENDENT_AMBULATORY_CARE_PROVIDER_SITE_OTHER): Payer: Medicare Other | Admitting: Internal Medicine

## 2022-11-25 DIAGNOSIS — Z794 Long term (current) use of insulin: Secondary | ICD-10-CM | POA: Diagnosis not present

## 2022-11-25 DIAGNOSIS — E1159 Type 2 diabetes mellitus with other circulatory complications: Secondary | ICD-10-CM | POA: Diagnosis not present

## 2022-11-26 NOTE — Telephone Encounter (Addendum)
   CGM interpretation: -At today's visit, we reviewed his CGM downloads: It appears that 98% of values are in target range (goal >70%), while 1% are higher than 180 (goal <25%), and 1% are lower than 70 (goal <4%).  The calculated average blood sugar is 115.  The projected HbA1c for the next 3 months (GMI) is 115. -Reviewing the CGM trends, sugars appear to be excellent, fluctuating within the target range with occasional lower blood sugars and few highs.  Overall, the control is exemplary, and I would not necessarily recommend a change in pump settings. -I will advised the patient that he could introduce some of the proteins in grams as carbs into the pump (for example 30%) long with the regular carbs, especially for a high-protein meal or a low-carb meal.

## 2022-11-28 ENCOUNTER — Ambulatory Visit: Payer: Medicare Other | Admitting: Family Medicine

## 2022-12-04 ENCOUNTER — Encounter: Payer: Self-pay | Admitting: Internal Medicine

## 2022-12-04 MED ORDER — OMNIPOD 5 G7 PODS (GEN 5) MISC: 1.0000 | 3 refills | Status: DC

## 2022-12-04 MED ORDER — DEXCOM G7 SENSOR MISC: 3 refills | Status: DC

## 2022-12-04 MED ORDER — OMNIPOD 5 G7 INTRO (GEN 5) KIT
1.0000 | PACK | Freq: Once | 0 refills | Status: AC
Start: 2022-12-04 — End: 2022-12-04

## 2022-12-04 NOTE — Addendum Note (Signed)
Addended by: Pollie Meyer on: 12/04/2022 04:44 PM   Modules accepted: Orders

## 2022-12-04 NOTE — Telephone Encounter (Signed)
I called and spoke with the patient to let him know that he needs to reach out to [facility] to see if they have the Omnipod 5  g7 pods in stock. He will call back to let me know.

## 2022-12-05 ENCOUNTER — Ambulatory Visit (INDEPENDENT_AMBULATORY_CARE_PROVIDER_SITE_OTHER): Payer: Medicare Other | Admitting: Family Medicine

## 2022-12-05 ENCOUNTER — Encounter: Payer: Self-pay | Admitting: Family Medicine

## 2022-12-05 VITALS — BP 117/68 | HR 70 | Temp 97.3°F | Ht 68.0 in | Wt 138.0 lb

## 2022-12-05 DIAGNOSIS — R748 Abnormal levels of other serum enzymes: Secondary | ICD-10-CM | POA: Insufficient documentation

## 2022-12-05 DIAGNOSIS — Z7984 Long term (current) use of oral hypoglycemic drugs: Secondary | ICD-10-CM

## 2022-12-05 DIAGNOSIS — E119 Type 2 diabetes mellitus without complications: Secondary | ICD-10-CM | POA: Diagnosis not present

## 2022-12-05 DIAGNOSIS — Z7985 Long-term (current) use of injectable non-insulin antidiabetic drugs: Secondary | ICD-10-CM | POA: Diagnosis not present

## 2022-12-05 MED ORDER — MELOXICAM 15 MG PO TABS: 15.0000 mg | ORAL_TABLET | Freq: Every day | ORAL | 0 refills | Status: DC

## 2022-12-05 NOTE — Assessment & Plan Note (Signed)
Following with GI for this.  His alk phos levels have been trending down.  He will follow-up with them soon to recheck labs.

## 2022-12-05 NOTE — Patient Instructions (Signed)
It was very nice to see you today!  You have carpal tunnel syndrome.  Please work on the exercises. Use the splints at night.  Take the meloxicam for the next week or two.  Let us know if not getting better.   Return if symptoms worsen or fail to improve.   Take care, Dr Jimmey Ralph  PLEASE NOTE:  If you had any lab tests, please let us know if you have not heard back within a few days. You may see your results on mychart before we have a chance to review them but we will give you a call once they are reviewed by [place].   If we ordered any referrals today, please let us know if you have not heard from their office within the next week.   If you had any urgent prescriptions sent in today, please check with the pharmacy within an hour of our visit to make sure the prescription was transmitted appropriately.   Please try these tips to maintain a healthy lifestyle:  Eat at least 3 REAL meals and 1-2 snacks per day.  Aim for no more than 5 hours between eating.  If you eat breakfast, please do so within one hour of getting up.   Each meal should contain half fruits/vegetables, one quarter protein, and one quarter carbs (no bigger than a computer mouse)  Cut down on sweet beverages. This includes juice, soda, and sweet tea.   Drink at least 1 glass of water with each meal and aim for at least 8 glasses per day  Exercise at least 150 minutes every week.  Jeannie Fend

## 2022-12-05 NOTE — Progress Notes (Signed)
   David Maynard is a 72 y.o. male who presents today for an office visit.  Assessment/Plan:  New/Acute Problems: Carpal Tunnel Syndrome  Exam and history consistent with carpal tunnel syndrome.  Positive Tinel sign on exam bilaterally.  We discussed conservative measures including home exercises.  He has been using splinting occasionally and will continue with this.  Recommended nocturnal splinting bilaterally if possible.  Will also start short course of meloxicam.  He will let us know if not improving in the next couple weeks and we can refer to PT or sports medicine.  Chronic Problems Addressed Today: T2DM (type 2 diabetes mellitus) (HCC) Following with endocrinology.  On insulin pump, metformin 1000 mg twice daily and Ozempic 0.5 mg weekly.  Elevated alkaline phosphatase level Following with GI for this.  His alk phos levels have been trending down.  He will follow-up with them soon to recheck labs.     Subjective:  HPI:  See Assessment / plan for status of chronic conditions.  Patient here today with numbness on the right hand.  We last saw him about 9 months ago. He has been following with endocrinology since our last visit. He noticed that his symptoms started several weeks ago. He has maintained his normal activity level and has been playing a lot of golf. He does well with this typically.  He has noticed that his symptoms are worse with driving. Sometimes gets pain in wrist after playing tennis. Tried using a wrist brace without much improvement. He is getting more tingling in tips of fingers. No pain in neck.        Objective:  Physical Exam: BP 117/68   Pulse 70   Temp (!) 97.3 F (36.3 C) (Temporal)   Ht 5\' 8"  (1.727 m)   Wt 138 lb (62.6 kg)   SpO2 97%   BMI 20.98 kg/m   Gen: No acute distress, resting comfortably MUSCULOSKELETAL: - Hands: Bony hypertrophy noted.  Tinel sign positive bilateral wrist. Neuro: Grossly normal, moves all extremities Psych: Normal  affect and thought content      David Maynard M. David Ralph, MD 12/05/2022 9:40 AM

## 2022-12-05 NOTE — Assessment & Plan Note (Signed)
Following with endocrinology.  On insulin pump, metformin 1000 mg twice daily and Ozempic 0.5 mg weekly.

## 2022-12-15 ENCOUNTER — Encounter: Payer: Self-pay | Admitting: Internal Medicine

## 2022-12-15 DIAGNOSIS — E119 Type 2 diabetes mellitus without complications: Secondary | ICD-10-CM

## 2022-12-17 MED ORDER — ACCU-CHEK SOFTCLIX LANCETS MISC: 2 refills | Status: AC

## 2022-12-17 MED ORDER — ACCU-CHEK GUIDE TEST VI STRP: ORAL_STRIP | 2 refills | Status: DC

## 2022-12-17 MED ORDER — ACCU-CHEK GUIDE ME W/DEVICE KIT: PACK | 0 refills | Status: AC

## 2022-12-17 NOTE — Telephone Encounter (Signed)
Please contact pt for Omnipod training.

## 2022-12-18 ENCOUNTER — Other Ambulatory Visit: Payer: Self-pay | Admitting: Internal Medicine

## 2022-12-18 ENCOUNTER — Encounter: Payer: Medicare Other | Attending: Internal Medicine | Admitting: Nutrition

## 2022-12-18 DIAGNOSIS — Z713 Dietary counseling and surveillance: Secondary | ICD-10-CM | POA: Diagnosis not present

## 2022-12-18 DIAGNOSIS — Z794 Long term (current) use of insulin: Secondary | ICD-10-CM | POA: Diagnosis present

## 2022-12-18 DIAGNOSIS — Z9641 Presence of insulin pump (external) (internal): Secondary | ICD-10-CM | POA: Diagnosis not present

## 2022-12-18 DIAGNOSIS — E039 Hypothyroidism, unspecified: Secondary | ICD-10-CM

## 2022-12-18 DIAGNOSIS — E1100 Type 2 diabetes mellitus with hyperosmolarity without nonketotic hyperglycemic-hyperosmolar coma (NKHHC): Secondary | ICD-10-CM | POA: Diagnosis present

## 2022-12-18 NOTE — Telephone Encounter (Signed)
I have scheduled him for this afternoon

## 2022-12-19 NOTE — Patient Instructions (Signed)
Call Dexcom help line if questions or problems with the dexcom sensor Call OmniPod if difficulty with sensor connection or questions about pump use Do an extended bolus on all high fat meals where there at 2 or more high fat items in the meal.

## 2022-12-19 NOTE — Progress Notes (Signed)
Patient asked to come in to be trained on how to use the G7 sensor and how to link this to his new PDM.  He was trained on how to use the sensor and how to link the sensor to the new PDM.   Settings were transferred from his old PDM to the new one and this is the one,  He did not want to start the new one until the pod expired from his old PDM.  He had never used the extended bolus feature and asked about this.  He was shown how/when to use this and how to split the bolus based on the amount of fat in the meal.  He had no final questions

## 2022-12-20 ENCOUNTER — Encounter: Payer: Self-pay | Admitting: Family Medicine

## 2023-01-07 ENCOUNTER — Telehealth: Payer: Self-pay | Admitting: Gastroenterology

## 2023-01-07 ENCOUNTER — Other Ambulatory Visit (INDEPENDENT_AMBULATORY_CARE_PROVIDER_SITE_OTHER): Payer: Medicare Other

## 2023-01-07 ENCOUNTER — Other Ambulatory Visit: Payer: Self-pay | Admitting: Family Medicine

## 2023-01-07 DIAGNOSIS — R7989 Other specified abnormal findings of blood chemistry: Secondary | ICD-10-CM

## 2023-01-07 LAB — HEPATIC FUNCTION PANEL
ALT: 27 U/L (ref 0–53)
AST: 33 U/L (ref 0–37)
Albumin: 4.1 g/dL (ref 3.5–5.2)
Alkaline Phosphatase: 167 U/L — ABNORMAL HIGH (ref 39–117)
Bilirubin, Direct: 0.1 mg/dL (ref 0.0–0.3)
Total Bilirubin: 0.4 mg/dL (ref 0.2–1.2)
Total Protein: 6.6 g/dL (ref 6.0–8.3)

## 2023-01-07 LAB — PROTIME-INR
INR: 1.1 {ratio} — ABNORMAL HIGH (ref 0.8–1.0)
Prothrombin Time: 11.4 s (ref 9.6–13.1)

## 2023-01-07 NOTE — Telephone Encounter (Signed)
The pt has been advised that he is due now and the orders have been entered.  He will come as soon as able.

## 2023-01-07 NOTE — Telephone Encounter (Signed)
PT is calling to find out if it is time for him to have lab work to check liver levels. Please advise.

## 2023-01-08 ENCOUNTER — Other Ambulatory Visit: Payer: Self-pay

## 2023-01-08 DIAGNOSIS — R7989 Other specified abnormal findings of blood chemistry: Secondary | ICD-10-CM

## 2023-01-11 ENCOUNTER — Other Ambulatory Visit: Payer: Self-pay | Admitting: Family Medicine

## 2023-01-25 ENCOUNTER — Other Ambulatory Visit: Payer: Self-pay | Admitting: Internal Medicine

## 2023-01-25 DIAGNOSIS — E119 Type 2 diabetes mellitus without complications: Secondary | ICD-10-CM

## 2023-01-25 NOTE — Telephone Encounter (Signed)
 Insulin   refill request complete

## 2023-02-15 ENCOUNTER — Other Ambulatory Visit: Payer: Self-pay | Admitting: Internal Medicine

## 2023-02-23 ENCOUNTER — Encounter: Payer: Self-pay | Admitting: Family Medicine

## 2023-02-25 ENCOUNTER — Other Ambulatory Visit: Payer: Self-pay | Admitting: *Deleted

## 2023-02-25 MED ORDER — AMOXICILLIN-POT CLAVULANATE 875-125 MG PO TABS: 1.0000 | ORAL_TABLET | Freq: Two times a day (BID) | ORAL | 0 refills | Status: AC

## 2023-02-25 NOTE — Telephone Encounter (Signed)
Ok to send in one time refill for Augmentin 875-125 mg twice daily dispense #14

## 2023-02-25 NOTE — Telephone Encounter (Signed)
 Please advise

## 2023-03-11 ENCOUNTER — Ambulatory Visit (INDEPENDENT_AMBULATORY_CARE_PROVIDER_SITE_OTHER): Payer: Medicare Other | Admitting: Internal Medicine

## 2023-03-11 ENCOUNTER — Encounter: Payer: Self-pay | Admitting: Internal Medicine

## 2023-03-11 ENCOUNTER — Other Ambulatory Visit: Payer: Medicare Other

## 2023-03-11 VITALS — BP 120/70 | HR 75 | Ht 68.0 in | Wt 141.8 lb

## 2023-03-11 DIAGNOSIS — E119 Type 2 diabetes mellitus without complications: Secondary | ICD-10-CM

## 2023-03-11 DIAGNOSIS — E1169 Type 2 diabetes mellitus with other specified complication: Secondary | ICD-10-CM | POA: Diagnosis not present

## 2023-03-11 DIAGNOSIS — E039 Hypothyroidism, unspecified: Secondary | ICD-10-CM | POA: Diagnosis not present

## 2023-03-11 DIAGNOSIS — R7989 Other specified abnormal findings of blood chemistry: Secondary | ICD-10-CM

## 2023-03-11 DIAGNOSIS — Z7984 Long term (current) use of oral hypoglycemic drugs: Secondary | ICD-10-CM

## 2023-03-11 DIAGNOSIS — E785 Hyperlipidemia, unspecified: Secondary | ICD-10-CM

## 2023-03-11 DIAGNOSIS — Z7985 Long-term (current) use of injectable non-insulin antidiabetic drugs: Secondary | ICD-10-CM

## 2023-03-11 LAB — POCT GLYCOSYLATED HEMOGLOBIN (HGB A1C): Hemoglobin A1C: 5.7 % — AB (ref 4.0–5.6)

## 2023-03-11 LAB — HEPATIC FUNCTION PANEL
ALT: 27 U/L (ref 0–53)
AST: 33 U/L (ref 0–37)
Albumin: 4 g/dL (ref 3.5–5.2)
Alkaline Phosphatase: 175 U/L — ABNORMAL HIGH (ref 39–117)
Bilirubin, Direct: 0.1 mg/dL (ref 0.0–0.3)
Total Bilirubin: 0.5 mg/dL (ref 0.2–1.2)
Total Protein: 6.5 g/dL (ref 6.0–8.3)

## 2023-03-11 NOTE — Progress Notes (Signed)
Patient ID: David Maynard, male   DOB: 29-Oct-1950, 73 y.o.   MRN: [id number]  HPI: David Maynard is a 73 y.o.-year-old male, returning for follow-up for DM2, diagnosed in 2008, on insulin since 2021, controlled, with complications (CAD).  He previously saw Dr. Everardo All, but last visit with me 6 months ago.  Interim history: No increased urination, blurry vision, nausea, chest pain. He has R shoulder pain - had a steroid inj this am. Sugars are higher: 180 now, after lunch.  Reviewed HbA1c levels: Lab Results  Component Value Date   HGBA1C 5.8 09/10/2022   HGBA1C 6.0 (A) 02/26/2022   HGBA1C 6.0 (A) 01/02/2022   Insulin pump:  -Omnipod 5  CGM: -Dexcom G6 >> G7   Insulin: -Humalog (did not work well for him) >> Lispro generic  Supplies: -Dexcom from [employer] -Omnipod from [employer] >> now [employer] [facility] b/c better price  Pump settings: - basal rates: 12 am: 0.25 units/h 5 am: 0.45 7 am: 0.3 - ICR: 1:7 - target: 110 >> 120 except 7 am to 11 pm: 110 - ISF: 1:40 - Active insulin time: 4h >> 2h - extended bolusing: not using - changes infusion site: q3 days TDD from basal insulin: 8.6 >> 10.3 units (28%) >> 35% >> 39% >> 28% >> 19% TDD from bolus insulin: 22.7 >> 26.4 units (72%) >> 65% >> 61% >> 72% >> 81% Total daily dose: 31-50 >> 42-60 units /day  He is also on: -  >> stopped 08/2021 - Victoza 1.8 mg in a.m. >> Ozempic 0.5 mg weekly - Metformin ER 1000 mg 2x a day >> 2000 mg with dinner  Meter: Accu-Chek guide  Pt checks his sugars more than 4 times a day:  Previously:  Prev.:   Lowest sugar was 50s >> 52 (CGM) >> 40s; he has hypoglycemia awareness at 60.  He does not have a glucagon kit at home.  His wife is an Charity fundraiser and NP. Highest sugar was 254 >> 200s >> 200s.  - no CKD, last BUN/creatinine:  Lab Results  Component Value Date   BUN 15 04/02/2022   BUN 23 03/19/2022   CREATININE 0.80 04/02/2022   CREATININE 0.72 03/19/2022   Lab Results  Component Value Date    MICRALBCREAT 8.9 03/13/2021   MICRALBCREAT 2.8 04/15/2018   MICRALBCREAT 2.8 12/03/2016   MICRALBCREAT 0.9 11/28/2015   MICRALBCREAT 1.0 12/03/2014   MICRALBCREAT 0.8 11/09/2013   MICRALBCREAT 9.4 03/29/2009   MICRALBCREAT 9.1 04/10/2006   - + HL; last set of lipids: Lab Results  Component Value Date   CHOL 150 03/19/2022   HDL 89.70 03/19/2022   LDLCALC 42 03/19/2022   TRIG 90.0 03/19/2022   CHOLHDL 2 03/19/2022  On Lipitor 40 mg daily, Welchol 650 x 2 tablets twice a day, omega-3 fatty acids 2000 mg twice a day.  - last eye exam was 11/27/2021. No DR. Cataract sx 2021.  His vision improved significantly afterwards.  - no numbness and tingling in his feet.  Last foot exam 02/26/2022.  On ASA 81.  Reviewed his TFTs: Lab Results  Component Value Date   TSH 6.00 (H) 10/29/2022   TSH 9.74 (H) 10/03/2022   TSH 6.83 (H) 03/19/2022   TSH 5.04 03/13/2021   TSH 2.74 02/23/2020   TSH 2.94 03/23/2019   TSH 6.67 (H) 01/13/2019   TSH 2.10 06/10/2018   TSH 2.72 12/03/2016   TSH 1.56 11/28/2015   TSH 1.82 12/03/2014   TSH 1.77 09/22/2014  TSH 1.99 11/09/2013   TSH 2.22 10/22/2012   TSH 2.68 06/19/2011   TSH 2.81 02/08/2010   TSH 2.35 03/29/2009   TSH 4.12 03/15/2008   TSH 3.36 06/18/2007   TSH 11.59 (H) 03/05/2007   He is on Levothyroxine 88 mcg daily, increased 09/2022. - in am - fasting - + b'fast >> now later - + calcium twice a day >> at night - no iron currently - + multivitamins in a.m. (~1h later) - + PPIs in a.m. >> later in the say, inconsistently - not on Biotin   He has family history of type 1 diabetes - son.  He also has a history of ADD, B12 deficiency, BPH, GERD, hypogonadism, osteoarthritis, depression, anemia.. He had a penile implant 02/2022 and he was started on Bactrim afterwards.  He developed kidney dysfunction and elevated LFTs: alk phos in the 700s and also increased AST and ALT.these improved.  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   and left   Thyroid disease    Unspecified hypothyroidism    Past Surgical History:  Procedure Laterality Date   COLONOSCOPY     COLONOSCOPY WITH PROPOFOL  02/28/2021   ESOPHAGOGASTRODUODENOSCOPY  06/16/2003   EYE SURGERY     INGUINAL HERNIA REPAIR     left   JOINT REPLACEMENT     KNEE ARTHROSCOPY     Left   KNEE ARTHROSCOPY W/ ACL RECONSTRUCTION     left   PENILE PROSTHESIS IMPLANT N/A 02/27/2022   Procedure: INSERTION OF INFLATABLE PENILE IMPLANT;  Surgeon: Despina Arias, MD;  Location: [facility];  Service: Urology;  Laterality: N/A;  90 MINUTES NEEDED FOR CASE   ROTATOR CUFF REPAIR     Right x2   Stress Cardiolite  07/15/2000   TOTAL KNEE ARTHROPLASTY Right 11/04/2017   TOTAL KNEE ARTHROPLASTY Right 11/04/2017   Procedure: TOTAL KNEE ARTHROPLASTY;  Surgeon: Salvatore Marvel, MD;  Location: [facility] OR;  Service: Orthopedics;  Laterality: Right;   TOTAL KNEE ARTHROPLASTY Left 12/23/2017   TOTAL KNEE ARTHROPLASTY Left 12/23/2017   Procedure: TOTAL KNEE ARTHROPLASTY;  Surgeon: Salvatore Marvel, MD;  Location: [facility] OR;  Service: Orthopedics;  Laterality: Left;   VASECTOMY  05/2002   Social History   Socioeconomic History   Marital status: Married    Spouse name: Not on file   Number of children: 2   Years of education: Not on file   Highest education level: Not on file  Occupational History   Occupation: Retired Water engineer for [employer]  Tobacco Use   Smoking  status: Never   Smokeless tobacco: Never  Vaping Use   Vaping status: Never Used  Substance and Sexual Activity   Alcohol use: Yes    Alcohol/week: 1.0 standard drink of alcohol    Types: 1 Cans of beer per week    Comment: weekly   Drug use: No  Sexual activity: Not on file  Other Topics Concern   Not on file  Social History Narrative   Not on file   Social Drivers of Health   Financial Resource Strain: Low Risk  (11/05/2022)   Overall Financial Resource Strain (CARDIA)    Difficulty of Paying Living Expenses: Not hard at all  Food Insecurity: No Food Insecurity (11/05/2022)   Hunger Vital Sign    Worried About Running Out of Food in the Last Year: Never true    Ran Out of Food in the Last Year: Never true  Transportation Needs: No Transportation Needs (11/05/2022)   PRAPARE - Administrator, Civil Service (Medical): No    Lack of Transportation (Non-Medical): No  Physical Activity: Sufficiently Active (11/05/2022)   Exercise Vital Sign    Days of Exercise per Week: 5 days    Minutes of Exercise per Session: 120 min  Stress: No Stress Concern Present (11/05/2022)   [employer] of Occupational Health - Occupational Stress Questionnaire    Feeling of Stress : Not at all  Social Connections: Socially Integrated (11/05/2022)   Social Connection and Isolation Panel [NHANES]    Frequency of Communication with Friends and Family: More than three times a week    Frequency of Social Gatherings with Friends and Family: More than three times a week    Attends Religious Services: More than 4 times per year    Active Member of [employer] or Organizations: Yes    Attends Banker Meetings: 1 to 4 times per year    Marital Status: Married  Catering manager Violence: Not At Risk (11/05/2022)   Humiliation, Afraid, Rape, and Kick questionnaire    Fear of Current or Ex-Partner: No    Emotionally Abused: No    Physically Abused: No    Sexually Abused: No    Current Outpatient Medications on File Prior to Visit  Medication Sig Dispense Refill   Accu-Chek Softclix Lancets lancets Use to check blood sugar 6 times a day 600 each 2   ascorbic acid (VITAMIN C) 500 MG tablet Take 1 tablet (500 mg total) by mouth daily. 20 tablet 0   aspirin EC 81 MG tablet Take 81 mg by mouth daily. Swallow whole.     atorvastatin (LIPITOR) 40 MG tablet TAKE 1 TABLET BY MOUTH DAILY 90 tablet 3   Blood Glucose Monitoring Suppl (ACCU-CHEK GUIDE ME) w/Device KIT Use to check blood sugar 6 times a day 1 kit 0   Calcium Carbonate (CALCIUM 600 PO) Take 1 tablet by mouth daily.     Cinnamon 500 MG capsule Take 3,000 mg by mouth 2 (two) times daily.     colesevelam (WELCHOL) 625 MG tablet TAKE 2 TABLETS BY MOUTH TWICE  DAILY WITH A MEAL 360 tablet 3   Continuous Blood Gluc Sensor (DEXCOM G6 SENSOR) MISC 1 Device by Does not apply route See admin instructions. 1 every 10 days 9 each 3   Continuous Glucose Sensor (DEXCOM G7 SENSOR) MISC Use to check glucose continuously, change sensor every 10 days 9 each 3   docusate sodium (COLACE) 100 MG capsule Take 100 mg by mouth 2 (two) times daily. Takes daily     Glucagon 3 MG/DOSE POWD Place 3 mg into the nose once as needed for up to 1 dose. 1 each 11   Glucosamine-Chondroit-Vit C-Mn (GLUCOSAMINE CHONDR 1500 COMPLX PO) Take 2 tablets by mouth daily.     glucose blood (ACCU-CHEK GUIDE TEST) test strip CHECK  BLOOD SUGAR 6 TIMES DAILY 600 strip 3   Insulin Disposable Pump (OMNIPOD 5 G6 POD, GEN 5,) MISC Change pod every 72 hours. 10 each 10   Insulin Disposable Pump (OMNIPOD 5 G7 PODS, GEN 5,) MISC 1 each by Does not apply route every 3 (three) days. 30 each 3   insulin lispro (HUMALOG) 100 UNIT/ML cartridge Inject 0.5 mLs (50 Units total) into the skin daily. Via pump 45 mL 3   insulin lispro (HUMALOG) 100 UNIT/ML injection INJECT SUBCUTANEOUSLY 50 UNITS  DAILY VIA INSULIN PUMP 50 mL 3   Insulin Lispro-aabc 100 UNIT/ML SOLN Inject 50  Units as directed daily. Via pump 50 mL 1   Insulin Pen Needle 31G X 5 MM MISC 1 Device by Does not apply route in the morning and at bedtime. 200 each 3   levothyroxine (SYNTHROID) 88 MCG tablet Take 1 tablet (88 mcg total) by mouth daily before breakfast. 90 tablet 1   meloxicam (MOBIC) 15 MG tablet TAKE 1 TABLET(15 MG) BY MOUTH DAILY 30 tablet 0   metFORMIN (GLUCOPHAGE-XR) 500 MG 24 hr tablet TAKE 2 TABLETS BY MOUTH TWICE  DAILY 360 tablet 3   Multiple Vitamin (MULTIVITAMIN) tablet Take 1 tablet by mouth daily.     MYRBETRIQ 50 MG TB24 tablet Take 50 mg by mouth daily.     omega-3 acid ethyl esters (LOVAZA) 1 g capsule TAKE 2 CAPSULES BY MOUTH TWICE  DAILY 360 capsule 3   Semaglutide,0.25 or 0.5MG /DOS, (OZEMPIC, 0.25 OR 0.5 MG/DOSE,) 2 MG/3ML SOPN Inject under skin 0.5 mg weekly under skin 9 mL 3   SYRINGE-NEEDLE, DISP, 3 ML (B-D 3CC LUER-LOK SYR 25GX1") 25G X 1" 3 ML MISC USE AS DIRECTED MONTHLY FOR B-12 INJECTIONS 3 each 0   tadalafil (ADCIRCA/CIALIS) 20 MG tablet Take 20 mg by mouth daily as needed for erectile dysfunction.     terbinafine (LAMISIL) 250 MG tablet Take 1 tablet (250 mg total) by mouth daily. 90 tablet 0   Trospium Chloride 60 MG CP24 Take 60 mg by mouth daily.     Turmeric 500 MG CAPS Take 500-1,000 mg by mouth daily. May take an extra dose if plays golf or tennis     VITAMIN D, CHOLECALCIFEROL, PO Take 1 tablet by mouth daily.     vitamin E 200 UNIT capsule Take 200 Units by mouth daily.     No current facility-administered medications on file prior to visit.   Allergies  Allergen Reactions   Bactrim [Sulfamethoxazole-Trimethoprim]    Family History  Problem Relation Age of Onset   Hypertension Mother    Kidney disease Father    Breast cancer Neg Hx    Celiac disease Neg Hx    Cirrhosis Neg Hx    Clotting disorder Neg Hx    Colitis Neg Hx    Colon cancer Neg Hx    Colon polyps Neg Hx    Crohn's disease Neg Hx    Cystic fibrosis Neg Hx    Diabetes Neg Hx     Esophageal cancer Neg Hx    Heart disease Neg Hx    Hemochromatosis Neg Hx    Inflammatory bowel disease Neg Hx    Irritable bowel syndrome Neg Hx    Liver cancer Neg Hx    Liver disease Neg Hx    Ovarian cancer Neg Hx    Pancreatic cancer Neg Hx    Prostate cancer Neg Hx    Rectal cancer Neg Hx    Stomach  cancer Neg Hx    Ulcerative colitis Neg Hx    Uterine cancer Neg Hx    Wilson's disease Neg Hx    PE: BP 120/70   Pulse 75   Ht 5\' 8"  (1.727 m)   Wt 141 lb 12.8 oz (64.3 kg)   SpO2 96%   BMI 21.56 kg/m  Wt Readings from Last 3 Encounters:  03/11/23 141 lb 12.8 oz (64.3 kg)  12/05/22 138 lb (62.6 kg)  11/05/22 137 lb (62.1 kg)   Constitutional: normal weight, in NAD Eyes: EOMI, no exophthalmos ENT:  no thyromegaly, no cervical lymphadenopathy Cardiovascular: RRR, No MRG Respiratory: CTA B Musculoskeletal: no deformities Skin: no rashes Neurological: no tremor with outstretched hands Diabetic Foot Exam - Simple   Simple Foot Form Diabetic Foot exam was performed with the following findings: Yes 03/11/2023  2:33 PM  Visual Inspection No deformities, no ulcerations, no other skin breakdown bilaterally: Yes Sensation Testing Intact to touch and monofilament testing bilaterally: Yes Pulse Check Posterior Tibialis and Dorsalis pulse intact bilaterally: Yes Comments + varicose veins B    ASSESSMENT: 1. DM2, controlled, with complications - CAD  2. HL  3.  Hypothyroidism  PLAN:  1. Patient with longstanding, well-controlled type 1 diabetes on the OmniPod 5 insulin pump integrated with the Dexcom CGM.  His blood sugars are usually well-controlled per review of his CGM trends and at last visit HbA1c was excellent, at 5.8%, improved.  At that time, we continued his metformin and Ozempic and we also discussed about possibly relaxing his target overnight as he felt that sugars are dropping more during this time period.  We did discuss at that time about trying to be less  aggressive (he was bolusing more than the pump was calculating for him) and follow the pump instructions.  Otherwise we did not change the regimen. CGM interpretation: -At today's visit, we reviewed his CGM downloads: It appears that 94% of values are in target range (goal >70%), while 1% are higher than 180 (goal <25%), and 5% are lower than 70 (goal <4%).  The calculated average blood sugar is 105.  The projected HbA1c for the next 3 months (GMI) is 5.8%. -Reviewing the CGM trends, sugars appear to be very well-controlled, fluctuating in the lower half of the target range, with occasional mildly hyperglycemic values, under 70 in the second half of the day.  Upon questioning, the patient is usually entering slightly higher amounts of carbs than he needs to account for protein and tach.  Due to the very good control and the occasional lows, I advised him not to override the bolus wizard as much as he can.  I did not recommend a change in regimen otherwise, however, he had a steroid injection this morning and sugars are higher and we discussed that he may need to have a slightly stronger insulin to carb ratios for the next 1 to 2 days.  I advised him to: Patient Instructions  Please use the following pump settings: - basal rates: 12 am: 0.25 units/h 5 am: 0.45 7 am: 0.3 - ICR: 1:6, but ICR for dinner (6 pm to 12 am): 1:7 - target: 110. Try 120 if the sugars are still dropping overnight. - ISF: 1:40 - Active insulin time: 2h  Please continue: - Metformin ER 2000 mg daily - Ozempic 0.5 mg weekly  Please return in ~6 months  - we checked his HbA1c: 5.7% (lower) - advised to check sugars at different times of the day -  4x a day, rotating check times - advised for yearly eye exams >> he is UTD - return to clinic in 6 months  2. HL -Latest lipid panel was at goal: Lab Results  Component Value Date   CHOL 150 03/19/2022   HDL 89.70 03/19/2022   LDLCALC 42 03/19/2022   TRIG 90.0 03/19/2022    CHOLHDL 2 03/19/2022  -He is on Lipitor 40 mg daily, WelChol 1300 mg twice a day, omega-3 fatty acids 2000 mg twice a day.  No side effects.  3.  Hypothyroidism - latest thyroid labs reviewed with pt. >> TSH still elevated: Lab Results  Component Value Date   TSH 6.00 (H) 10/29/2022  - he continues on LT4 88 mcg daily (dose increased 09/2022): - pt feels good on this dose. - we discussed about taking the thyroid hormone every day, with water, >30 minutes before breakfast, separated by >4 hours from acid reflux medications, calcium, iron, multivitamins. Pt. is taking it correctly.  At last visit he was taking levothyroxine in the morning but eating breakfast soon after and I advised him to separate by at least 30 minutes from breakfast and more of calcium, multivitamins, and acid reflux medicines at night.  At today's visit he tells me that he moved calcium and the acid reflux medicine at night, but he is still taking the multivitamins approximately 1 hour later.  I advised him to move this at least 4 hours later. -Will check his thyroid tests in 1.5 months if not checked at next visit with PCP next month  Carlus Pavlov, MD PhD [facility]

## 2023-03-11 NOTE — Patient Instructions (Addendum)
Please use the following pump settings: - basal rates: 12 am: 0.25 units/h 5 am: 0.45 7 am: 0.3 - ICR: 1:7 (1:6 for the next 1-2 days) - target: 110, but 120 at 11 pm to 7 am - ISF: 1:40 - Active insulin time: 2h  Please continue: - Metformin ER 2000 mg daily - Ozempic 0.5 mg weekly  Please continue Levothyroxine 88 mcg daily.  Take the thyroid hormone every day, with water, at least 30 minutes before breakfast, separated by at least 4 hours from: - acid reflux medications - calcium - iron - multivitamins  Please return in ~6 months

## 2023-03-12 ENCOUNTER — Telehealth: Payer: Self-pay

## 2023-03-12 ENCOUNTER — Encounter: Payer: Self-pay | Admitting: Internal Medicine

## 2023-03-12 LAB — MICROALBUMIN / CREATININE URINE RATIO
Creatinine, Urine: 35 mg/dL (ref 20–320)
Microalb Creat Ratio: 137 mg/g{creat} — ABNORMAL HIGH (ref <30)
Microalb, Ur: 4.8 mg/dL

## 2023-03-12 NOTE — Telephone Encounter (Signed)
-----   Message from Willette Cluster sent at 03/11/2023  9:27 PM EST ----- Hi Benigno Check,  I somehow got these labs on Dr. Ardell Isaacs patient. Can you send the labs to whoever was doc of day when labs were drawn or to whichever provider will be taking over his care? I haven't seen this patient before.  Thanks ----- Message ----- From: Interface, Lab In Three Zero One Sent: 03/11/2023  10:30 AM EST To: [facility]

## 2023-03-12 NOTE — Telephone Encounter (Signed)
I spoke with the pt and made him aware of the lab test results.  He will call back when he is able to make follow up appt.

## 2023-03-12 NOTE — Telephone Encounter (Signed)
Dr Doy Hutching can you please review as DOD when labs were drawn for previous Dt Russella Dar pt?

## 2023-03-21 ENCOUNTER — Encounter: Payer: Self-pay | Admitting: Family Medicine

## 2023-03-22 ENCOUNTER — Encounter: Payer: Self-pay | Admitting: Internal Medicine

## 2023-03-25 ENCOUNTER — Encounter: Payer: Medicare Other | Admitting: Family Medicine

## 2023-03-25 NOTE — Telephone Encounter (Signed)
 Please Francee Piccolo

## 2023-03-26 ENCOUNTER — Other Ambulatory Visit: Payer: Self-pay | Admitting: *Deleted

## 2023-03-26 DIAGNOSIS — Z0000 Encounter for general adult medical examination without abnormal findings: Secondary | ICD-10-CM

## 2023-03-26 DIAGNOSIS — E039 Hypothyroidism, unspecified: Secondary | ICD-10-CM

## 2023-03-26 DIAGNOSIS — E1169 Type 2 diabetes mellitus with other specified complication: Secondary | ICD-10-CM

## 2023-03-26 DIAGNOSIS — E119 Type 2 diabetes mellitus without complications: Secondary | ICD-10-CM

## 2023-03-26 NOTE — Telephone Encounter (Signed)
 Ok to order labs.  Katina Degree. Jimmey Ralph, MD 03/26/2023 12:35 PM

## 2023-04-10 ENCOUNTER — Encounter: Payer: Self-pay | Admitting: Internal Medicine

## 2023-04-21 ENCOUNTER — Other Ambulatory Visit: Payer: Self-pay | Admitting: Internal Medicine

## 2023-04-21 DIAGNOSIS — E039 Hypothyroidism, unspecified: Secondary | ICD-10-CM

## 2023-04-24 ENCOUNTER — Other Ambulatory Visit (INDEPENDENT_AMBULATORY_CARE_PROVIDER_SITE_OTHER)

## 2023-04-24 ENCOUNTER — Encounter: Payer: Self-pay | Admitting: Physician Assistant

## 2023-04-24 ENCOUNTER — Ambulatory Visit (INDEPENDENT_AMBULATORY_CARE_PROVIDER_SITE_OTHER): Admitting: Physician Assistant

## 2023-04-24 DIAGNOSIS — M25531 Pain in right wrist: Secondary | ICD-10-CM

## 2023-04-24 NOTE — Progress Notes (Signed)
 Office Visit Note   Patient: David Maynard           Date of Birth: 1951-01-14           MRN: [id number] Visit Date: 04/24/2023              Requested by: Ardith Dark, MD [address] [place],  [place] [address] PCP: Ardith Dark, MD   Assessment & Plan: Visit Diagnoses:  1. Pain in right wrist     Plan: Patient is a pleasant 73 year old gentleman who is right-hand dominant and plays quite a bit of tennis.  He comes in today complaining of 2 things first she is complaining of right radial wrist pain.  Denies any injury but does play quite a bit of tennis.  Has been taking any medication.  Also complaining of numbness.  He has been seen by Dr. Eulah Pont and did recently have what appears to be from his description a carpal tunnel injection.  He thinks he had some numbness in his thumb and his index finger and his long finger but now only has it at the tip of his long finger.  With Tinel's he has positive for twitching and reaction in his long finger at the tip but again he recently had an injection.  Negative Phalen's.  I think he may have an aspect of carpal tunnel though difficult because of the recent injection.  He talked that his son who is an orthopedic PT recommended nerve conduction studies.  He also has quite a bit of stiffness and a palpable cyst at the first [facility] joint which is also a bother to him and this is secondary to his arthritis he said he did want to see a hand specialist but was told that Dr. Fara Boros did not have availability till next week.  I encouraged him to schedule appointment and told him that this is unusual for him to have an opening.  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  Follow-Up Instructions:   Orders:  Orders Placed This Encounter  Procedures   XR Wrist Complete Right   No orders of the defined types were placed in this encounter.     Procedures: No procedures  performed   Clinical Data: No additional findings.   Subjective: Right wrist and hand pain paresthesias in the thumb index finger and long finger  HPI patient is a active right-hand-dominant 73 year old gentleman comes in today with pain and stiffness in his right wrist.  Also complains of some paresthesias.  From what he describes these were he thinks in the thumb index finger and long finger.  He says he did have an injection with Dr. Eulah Pont so now he just has some numbness at the tip of his long finger.  His son is an orthopedic physical therapist and thinks he has carpal tunnel syndrome.  He has noticed that he has also lost some motion in his wrist he wants to see a hand specialist.  Review of Systems  All other systems reviewed and are negative.    Objective: Vital Signs: There were no vitals taken for this visit.  Physical Exam Constitutional:      Appearance: Normal appearance.  Pulmonary:     Effort: Pulmonary effort is normal.  Skin:    General: Skin is warm and dry.  Neurological:     General: No focal deficit present.  Mental Status: He is alert and oriented to person, place, and time.  Psychiatric:        Mood and Affect: Mood normal.        Behavior: Behavior normal.     Ortho Exam  Specialty Comments:  No specialty comments available.  Imaging: XR Wrist Complete Right Result Date: 04/24/2023 Radiographs of his right wrist were reviewed today.  He has significant degenerative changes in his wrist especially on the radial side also in the first [facility].  Cannot appreciate any acute fractures    PMFS History: Patient Active Problem List   Diagnosis Date Noted   Elevated alkaline phosphatase level 12/05/2022   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   Umbilical hernia without obstruction and without gangrene 02/23/2020   Gastroesophageal reflux disease 01/13/2019   BPH  01/08/2018   ED (erectile dysfunction) 01/08/2018   OAB (overactive bladder)  01/08/2018   History of bilateral knee replacement 12/11/2017   Lumbar disc disease 09/22/2010   T2DM (type 2 diabetes mellitus) (HCC)    Hypogonadism male 06/19/2007   Dyslipidemia associated with type 2 diabetes mellitus (HCC) 06/19/2007   Hypothyroidism 03/05/2007   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   and left   Thyroid disease    Unspecified hypothyroidism     Family History  Problem Relation Age of Onset   Hypertension Mother    Kidney disease Father    Breast cancer Neg Hx    Celiac disease Neg Hx    Cirrhosis Neg Hx    Clotting disorder Neg Hx    Colitis Neg Hx    Colon cancer Neg Hx    Colon polyps Neg Hx    Crohn's disease Neg Hx    Cystic fibrosis Neg Hx    Diabetes Neg Hx    Esophageal cancer Neg Hx    Heart disease Neg Hx    Hemochromatosis Neg Hx    Inflammatory bowel disease Neg Hx    Irritable bowel syndrome Neg Hx    Liver cancer Neg Hx    Liver disease Neg Hx    Ovarian cancer Neg Hx    Pancreatic cancer Neg Hx    Prostate cancer Neg Hx    Rectal cancer Neg Hx    Stomach cancer Neg Hx    Ulcerative colitis Neg Hx    Uterine cancer Neg Hx    Wilson's disease Neg Hx     Past Surgical History:  Procedure Laterality Date   COLONOSCOPY     COLONOSCOPY WITH PROPOFOL  02/28/2021   ESOPHAGOGASTRODUODENOSCOPY  06/16/2003   EYE SURGERY     INGUINAL HERNIA REPAIR     left   JOINT REPLACEMENT     KNEE ARTHROSCOPY     Left   KNEE ARTHROSCOPY W/ ACL  RECONSTRUCTION     left   PENILE PROSTHESIS IMPLANT N/A 02/27/2022   Procedure: INSERTION OF INFLATABLE PENILE  IMPLANT;  Surgeon: Despina Arias, MD;  Location: [facility];  Service: Urology;  Laterality: N/A;  90 MINUTES NEEDED FOR CASE   ROTATOR CUFF REPAIR     Right x2   Stress Cardiolite  07/15/2000   TOTAL KNEE ARTHROPLASTY Right 11/04/2017   TOTAL KNEE ARTHROPLASTY Right 11/04/2017   Procedure: TOTAL KNEE ARTHROPLASTY;  Surgeon: Salvatore Marvel, MD;  Location: [facility] OR;  Service: Orthopedics;  Laterality: Right;   TOTAL KNEE ARTHROPLASTY Left 12/23/2017   TOTAL KNEE ARTHROPLASTY Left 12/23/2017   Procedure: TOTAL KNEE ARTHROPLASTY;  Surgeon: Salvatore Marvel, MD;  Location: [facility] OR;  Service: Orthopedics;  Laterality: Left;   VASECTOMY  05/2002   Social History   Occupational History   Occupation: Retired Water engineer for [employer]  Tobacco Use   Smoking status: Never   Smokeless tobacco: Never  Vaping Use   Vaping status: Never Used  Substance and Sexual Activity   Alcohol use: Yes    Alcohol/week: 1.0 standard drink of alcohol    Types: 1 Cans of beer per week    Comment: weekly   Drug use: No   Sexual activity: Not on file

## 2023-04-30 ENCOUNTER — Ambulatory Visit: Admitting: Orthopedic Surgery

## 2023-04-30 DIAGNOSIS — M25531 Pain in right wrist: Secondary | ICD-10-CM | POA: Diagnosis not present

## 2023-04-30 DIAGNOSIS — G5601 Carpal tunnel syndrome, right upper limb: Secondary | ICD-10-CM

## 2023-04-30 NOTE — Progress Notes (Unsigned)
 David Maynard - 73 y.o. male MRN [id number]  Date of birth: 06/22/1950  Office Visit Note: Visit Date: 04/30/2023 PCP: Ardith Dark, MD Referred by: Ardith Dark, MD  Subjective: No chief complaint on file.  HPI: David Maynard is a pleasant 73 y.o. male who presents today for evaluation of ongoing right wrist pain/stiffness with associated numbness tingling.  He has been seen previously by Dr. Eulah Pont at [facility] who performed carpal tunnel injection earlier this year which did provide him some relief of the numbness and tingling.  His wrist pain is persistent however, he feels its mostly related to activity and usage, he does play a fair amount of tennis which he feels may have contributed over time.  Denies any prior injury to the wrist.  Has been utilizing a wrist brace which does involve the thumb mostly at night with mild relief.  Pertinent ROS were reviewed with the patient and found to be negative unless otherwise specified above in HPI.   Visit Reason: right wrist and hand Duration of symptoms: 1 year Hand dominance: right Occupation: retired Diabetic: Yes / 5.7 Smoking: No Heart/Lung History: yes Blood Thinners:  aspirin  Prior Testing/EMG: 04/24/23 Injections (Date): 03/18/23  Assessment & Plan: Visit Diagnoses:  1. Pain in right wrist   2. Carpal tunnel syndrome of right wrist     Plan: Extensive discussion was had with the patient today regarding his multitude of complaints at the right wrist and hand.  I reviewed his radiographs in detail with him which do show significant degenerative changes at both the wrist level and the thumb CMC interval.  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  Given the advanced nature of this arthritis, I did discuss with him that there would be the option for potential salvage procedures in the future such as  scaphoidectomy with 4 corner fusion, proximal row carpectomy with allograft spacer or potential wrist arthroplasty versus fusion.  However, I did explain that with these salvage procedures, there would be potential loss of some strength and motion long-term to the wrist, which has been relatively well-maintained despite his arthritic changes.  From this standpoint, I discussed that we would continue with conservative measures for the time being as long as we can keep him fairly pain-free, if symptoms became worsening and refractory to conservative measures, then we could consider these more salvage type procedures in the future.  I explained that these procedures would be fairly aggressive and would need extensive recovery as well, he did mention that his son is an orthopedic physical therapist and has a good understanding of these protocols.  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  He also has developed a cystic-like mass on examination today over the thumb CMC interval with associated tenderness.  I explained that there is a separate procedure with trapeziectomy and potential stabilization of the thumb metacarpal which could be performed as well if this area becomes more symptomatic compared to the wrist in the future.  We may be able to perform cortisone injection to the thumb CMC interval in the future for both diagnostic and therapeutic measures as well.  As for the numbness and tingling, given that he responded well to a carpal tunnel injection recently, this is a good piece of diagnostic information to confirm the  diagnosis of carpal tunnel syndrome.  He was curious about the timeline for potential carpal tunnel release and the overall severity of his condition, I explained that with an electrodiagnostic study of the right upper extremity we would be better able to determine severity and help guide potential treatments moving  forward.  We discussed the possibility of carpal tunnel release as well as the postoperative protocol for this as well.  I explained that the recovery for this is much shorter than the above-mentioned procedures for both the wrist and thumb CMC arthritis.  For the time being, we will begin with right upper extremity electrodiagnostic study to be performed in the near future, he will return to me after the study is complete to review the results and discuss appropriate next treatment steps.  In the meantime, he can continue utilizing his brace as needed for the wrist and thumb.    Follow-up: No follow-ups on file.   Meds & Orders: No orders of the defined types were placed in this encounter.   Orders Placed This Encounter  Procedures   Ambulatory referral to Physical Medicine Rehab     Procedures: No procedures performed      Clinical History: No specialty comments available.  He reports that he has never smoked. He has never used smokeless tobacco.  Recent Labs    09/10/22 0000 03/11/23 1429  HGBA1C 5.8 5.7*    Objective:   Vital Signs: There were no vitals taken for this visit.  Physical Exam  Gen: Well-appearing, in no acute distress; non-toxic CV: Regular Rate. Well-perfused. Warm.  Resp: Breathing unlabored on room air; no wheezing. Psych: Fluid speech in conversation; appropriate affect; normal thought process  Ortho Exam Right wrist: - Notable swelling dorsally over the wrist and thumb CMC interval, cystic-like structure over the thumb CMC as well, soft, compressible, slightly mobile - Pain elicited with deep palpation at the radiocarpal joint and the thumb CMC joint, positive thumb CMC grind for pain and crepitus, no significant MP hyperextension - Able to form composite fist - Sensation intact median/radial/ulnar distributions - AIN/PIN/interosseous intact - Notable clunk with Watson shift testing at the wrist - Positive Tinel's carpal tunnel, positive Phalen's,  positive Durkan's compression  Imaging: No results found.  Past Medical/Family/Surgical/Social History: Medications & Allergies reviewed per EMR, new medications updated. Patient Active Problem List   Diagnosis Date Noted   Elevated alkaline phosphatase level 12/05/2022   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   Umbilical hernia without obstruction and without gangrene 02/23/2020   Gastroesophageal reflux disease 01/13/2019   BPH  01/08/2018   ED (erectile dysfunction) 01/08/2018   OAB (overactive bladder) 01/08/2018   History of bilateral knee replacement 12/11/2017   Lumbar disc disease 09/22/2010   T2DM (type 2 diabetes mellitus) (HCC)    Hypogonadism male 06/19/2007   Dyslipidemia associated with type 2 diabetes mellitus (HCC) 06/19/2007   Hypothyroidism 03/05/2007   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   Cataract    removed both 05-2018 or 2021   Coronary artery disease    Depressive disorder, not elsewhere classified    pt denies depression-   Diabetes mellitus type II    GERD (gastroesophageal reflux disease)    past hx   Hypercholesterolemia    Hypogonadism male    Primary localized osteoarthritis of left knee 12/11/2017   Primary localized osteoarthritis of right knee 10/23/2017  S/P total knee arthroplasty, 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   and left   Thyroid disease    Unspecified hypothyroidism    Family History  Problem Relation Age of Onset   Hypertension Mother    Kidney disease Father    Breast cancer Neg Hx    Celiac disease Neg Hx    Cirrhosis Neg Hx    Clotting disorder Neg Hx    Colitis Neg Hx    Colon cancer Neg Hx    Colon polyps Neg Hx    Crohn's disease Neg Hx    Cystic fibrosis Neg Hx    Diabetes Neg Hx    Esophageal cancer Neg Hx    Heart disease Neg Hx    Hemochromatosis Neg Hx    Inflammatory bowel disease Neg Hx    Irritable bowel syndrome Neg Hx    Liver cancer Neg Hx     Liver disease Neg Hx    Ovarian cancer Neg Hx    Pancreatic cancer Neg Hx    Prostate cancer Neg Hx    Rectal cancer Neg Hx    Stomach cancer Neg Hx    Ulcerative colitis Neg Hx    Uterine cancer Neg Hx    Wilson's disease Neg Hx    Past Surgical History:  Procedure Laterality Date   COLONOSCOPY     COLONOSCOPY WITH PROPOFOL  02/28/2021   ESOPHAGOGASTRODUODENOSCOPY  06/16/2003   EYE SURGERY     INGUINAL HERNIA REPAIR     left   JOINT REPLACEMENT     KNEE ARTHROSCOPY     Left   KNEE ARTHROSCOPY W/ ACL RECONSTRUCTION     left   PENILE PROSTHESIS IMPLANT N/A 02/27/2022   Procedure: INSERTION OF INFLATABLE PENILE IMPLANT;  Surgeon: Despina Arias, MD;  Location: [facility];  Service: Urology;  Laterality: N/A;  90 MINUTES NEEDED FOR CASE   ROTATOR CUFF REPAIR     Right x2   Stress Cardiolite  07/15/2000   TOTAL KNEE ARTHROPLASTY Right 11/04/2017   TOTAL KNEE ARTHROPLASTY Right 11/04/2017   Procedure: TOTAL KNEE ARTHROPLASTY;  Surgeon: Salvatore Marvel, MD;  Location: [facility] OR;  Service: Orthopedics;  Laterality: Right;   TOTAL KNEE ARTHROPLASTY Left 12/23/2017   TOTAL KNEE ARTHROPLASTY Left 12/23/2017   Procedure: TOTAL KNEE ARTHROPLASTY;  Surgeon: Salvatore Marvel, MD;  Location: [facility] OR;  Service: Orthopedics;  Laterality: Left;   VASECTOMY  05/2002   Social History   Occupational History   Occupation: Retired Water engineer for [employer]  Tobacco Use   Smoking status: Never   Smokeless tobacco: Never  Vaping Use   Vaping status: Never Used  Substance and Sexual Activity   Alcohol use: Yes    Alcohol/week: 1.0 standard drink of alcohol    Types: 1 Cans of beer per week    Comment: weekly   Drug use: No   Sexual activity: Not on file    Loucile Posner Fara Boros) Denese Killings, M.D. [facility], Hand Surgery

## 2023-05-03 ENCOUNTER — Other Ambulatory Visit: Payer: Self-pay | Admitting: Family Medicine

## 2023-05-15 ENCOUNTER — Telehealth: Payer: Self-pay | Admitting: Nutrition

## 2023-05-15 ENCOUNTER — Ambulatory Visit (INDEPENDENT_AMBULATORY_CARE_PROVIDER_SITE_OTHER): Admitting: Physical Medicine and Rehabilitation

## 2023-05-15 DIAGNOSIS — R202 Paresthesia of skin: Secondary | ICD-10-CM

## 2023-05-15 NOTE — Telephone Encounter (Signed)
 Patient reports problems with his Dexcom sensor-variable readings and rapid swings in blood sugars without eating. Has calibrated sensor q 1-2 hours with fingerstick and it continues to do this.  He called help line, they inserted a new sensor last night and it still continues to do this same thing.   The sensor is not close to insulin  pump site, not in the muscle, not tylenol .  Download shows rapid rise and falls.  Suggested he wait and for 24 hours and see if they do not level out,  IF not, call Dexcom back.  He got a new shipment of sensors and will switch to the new batch to see if this may be the problem.  Will call Dexcom again after 24 hrs. If problem persists for replacement sensors.

## 2023-05-15 NOTE — Progress Notes (Signed)
 Pain Scale   Average Pain 0 Patient advises he is Right Hand Dominate Patient states he has numbness in his middle finger at tip on his right hand, no pain or complaints of weakness        +Driver, -BT, -Dye Allergies.

## 2023-05-15 NOTE — Telephone Encounter (Signed)
 Patient says dexcom readings are very inaccuracy.  Changed yesterday at 5:30AM.

## 2023-05-16 NOTE — Progress Notes (Signed)
 David Maynard - 73 y.o. male MRN [id number]  Date of birth: 1950/02/07  Office Visit Note: Visit Date: 05/15/2023 PCP: Rodney Clamp, MD Referred by: Merrill Abide, MD  Subjective: Chief Complaint  Patient presents with   Right Hand - Numbness   HPI: David Maynard is a 73 y.o. male who comes in today at the request of Dr. Anshul Agarwala for evaluation and management of chronic, worsening and severe pain, numbness and tingling in the Right upper extremities.  Patient is Right hand dominant.      ROS Otherwise per HPI.  Assessment & Plan: Visit Diagnoses:    ICD-10-CM   1. Paresthesia of skin  R20.2 NCV with EMG (electromyography)       Plan: Impression: The above electrodiagnostic study is ABNORMAL and reveals evidence of a severe right median nerve entrapment at the wrist (carpal tunnel syndrome) affecting sensory and motor components.   There is no significant electrodiagnostic evidence of any other focal nerve entrapment, brachial plexopathy or cervical radiculopathy.   Recommendations: 1.  Follow-up with referring physician. 2.  Continue current management of symptoms. 3.  Continue use of resting splint at night-time and as needed during the day. 4.  Suggest surgical evaluation.  Meds & Orders: No orders of the defined types were placed in this encounter.   Orders Placed This Encounter  Procedures   NCV with EMG (electromyography)    Follow-up: Return for Anshul Agarwala, MD.   Procedures: No procedures performed  EMG & NCV Findings: Evaluation of the right median motor nerve showed prolonged distal onset latency (7.0 ms), reduced amplitude (4.6 mV), and decreased conduction velocity (Elbow-Wrist, 47 m/s).  The right median (across palm) sensory nerve showed prolonged distal peak latency (Wrist, 11.0 ms), reduced amplitude (5.9 V), and prolonged distal peak latency (Palm, 5.3 ms).  The right ulnar sensory nerve showed reduced amplitude (14.5 V).  All  remaining nerves (as indicated in the following tables) were within normal limits.    All examined muscles (as indicated in the following table) showed no evidence of electrical instability.    Impression: The above electrodiagnostic study is ABNORMAL and reveals evidence of a severe right median nerve entrapment at the wrist (carpal tunnel syndrome) affecting sensory and motor components.   There is no significant electrodiagnostic evidence of any other focal nerve entrapment, brachial plexopathy or cervical radiculopathy.   Recommendations: 1.  Follow-up with referring physician. 2.  Continue current management of symptoms. 3.  Continue use of resting splint at night-time and as needed during the day. 4.  Suggest surgical evaluation.  ___________________________ Collin Deal FAAPMR Board Certified, American Board of Physical Medicine and Rehabilitation    Nerve Conduction Studies Anti Sensory Summary Table   Stim Site NR Peak (ms) Norm Peak (ms) P-T Amp (V) Norm P-T Amp Site1 Site2 Delta-P (ms) Dist (cm) Vel (m/s) Norm Vel (m/s)  Right Median Acr Palm Anti Sensory (2nd Digit)  28.2C  Wrist    *11.0 <3.6 *5.9 >10 Wrist Palm 5.7 0.0    Palm    *5.3 <2.0 14.7         Right Radial Anti Sensory (Base 1st Digit)  31.1C  Wrist    2.5 <3.1 13.8  Wrist Base 1st Digit 2.5 0.0    Right Ulnar Anti Sensory (5th Digit)  31.4C  Wrist    3.5 <3.7 *14.5 >15.0 Wrist 5th Digit 3.5 14.0 40 >38   Motor Summary Table   Stim Site NR Onset (  ms) Norm Onset (ms) O-P Amp (mV) Norm O-P Amp Site1 Site2 Delta-0 (ms) Dist (cm) Vel (m/s) Norm Vel (m/s)  Right Median Motor (Abd Poll Brev)  31.6C  Wrist    *7.0 <4.2 *4.6 >5 Elbow Wrist 5.1 24.0 *47 >50  Elbow    12.1  4.7         Right Ulnar Motor (Abd Dig Min)  31.4C  Wrist    3.0 <4.2 4.8 >3 B Elbow Wrist 3.6 21.0 58 >53  B Elbow    6.6  4.6  A Elbow B Elbow 1.6 10.0 63 >53  A Elbow    8.2  4.5          EMG   Side Muscle Nerve Root Ins Act Fibs  Psw Amp Dur Poly Recrt Int Deatra Face Comment  Right Abd Poll Brev Median C8-T1 Nml Nml Nml Nml Nml 0 Nml Nml   Right 1stDorInt Ulnar C8-T1 Nml Nml Nml Nml Nml 0 Nml Nml   Right PronatorTeres Median C6-7 Nml Nml Nml Nml Nml 0 Nml Nml   Right Biceps Musculocut C5-6 Nml Nml Nml Nml Nml 0 Nml Nml     Nerve Conduction Studies Anti Sensory Left/Right Comparison   Stim Site L Lat (ms) R Lat (ms) L-R Lat (ms) L Amp (V) R Amp (V) L-R Amp (%) Site1 Site2 L Vel (m/s) R Vel (m/s) L-R Vel (m/s)  Median Acr Palm Anti Sensory (2nd Digit)  28.2C  Wrist  *11.0   *5.9  Wrist Palm     Palm  *5.3   14.7        Radial Anti Sensory (Base 1st Digit)  31.1C  Wrist  2.5   13.8  Wrist Base 1st Digit     Ulnar Anti Sensory (5th Digit)  31.4C  Wrist  3.5   *14.5  Wrist 5th Digit  40    Motor Left/Right Comparison   Stim Site L Lat (ms) R Lat (ms) L-R Lat (ms) L Amp (mV) R Amp (mV) L-R Amp (%) Site1 Site2 L Vel (m/s) R Vel (m/s) L-R Vel (m/s)  Median Motor (Abd Poll Brev)  31.6C  Wrist  *7.0   *4.6  Elbow Wrist  *47   Elbow  12.1   4.7        Ulnar Motor (Abd Dig Min)  31.4C  Wrist  3.0   4.8  B Elbow Wrist  58   B Elbow  6.6   4.6  A Elbow B Elbow  63   A Elbow  8.2   4.5           Waveforms:            Clinical History: No specialty comments available.   He reports that he has never smoked. He has never used smokeless tobacco.  Recent Labs    09/10/22 0000 03/11/23 1429  HGBA1C 5.8 5.7*    Objective:  VS:  HT:    WT:   BMI:     BP:   HR: bpm  TEMP: ( )  RESP:  Physical Exam Musculoskeletal:        General: Tenderness present.     Comments: Inspection reveals no atrophy of the bilateral APB or FDI or hand intrinsics. There is no swelling, color changes, allodynia or dystrophic changes. There is 5 out of 5 strength in the bilateral wrist extension, finger abduction and long finger flexion. There is intact sensation to light touch in all dermatomal and peripheral  nerve distributions.  There is a positive Phalen's test on the right. There is a negative Hoffmann's test bilaterally.  Skin:    General: Skin is warm and dry.     Findings: No erythema or rash.  Neurological:     General: No focal deficit present.     Mental Status: He is alert and oriented to person, place, and time.     Cranial Nerves: No cranial nerve deficit.     Sensory: Sensory deficit present.     Motor: No weakness or abnormal muscle tone.     Coordination: Coordination normal.     Gait: Gait normal.  Psychiatric:        Mood and Affect: Mood normal.        Behavior: Behavior normal.        Thought Content: Thought content normal.     Ortho Exam  Imaging: No results found.  Past Medical/Family/Surgical/Social History: Medications & Allergies reviewed per EMR, new medications updated. Patient Active Problem List   Diagnosis Date Noted   Elevated alkaline phosphatase level 12/05/2022   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   Umbilical hernia without obstruction and without gangrene 02/23/2020   Gastroesophageal reflux disease 01/13/2019   BPH  01/08/2018   ED (erectile dysfunction) 01/08/2018   OAB (overactive bladder) 01/08/2018   History of bilateral knee replacement 12/11/2017   Lumbar disc disease 09/22/2010   T2DM (type 2 diabetes mellitus) (HCC)    Hypogonadism male 06/19/2007   Dyslipidemia associated with type 2 diabetes mellitus (HCC) 06/19/2007   Hypothyroidism 03/05/2007   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   Cataract    removed both 05-2018 or 2021   Coronary artery disease    Depressive disorder, not elsewhere classified    pt denies depression-   Diabetes mellitus type II    GERD (gastroesophageal reflux disease)    past hx   Hypercholesterolemia    Hypogonadism male    Primary localized osteoarthritis of left knee 12/11/2017   Primary localized osteoarthritis of right knee 10/23/2017   S/P  total knee arthroplasty, 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   and left   Thyroid  disease    Unspecified hypothyroidism    Family History  Problem Relation Age of Onset   Hypertension Mother    Kidney disease Father    Breast cancer Neg Hx    Celiac disease Neg Hx    Cirrhosis Neg Hx    Clotting disorder Neg Hx    Colitis Neg Hx    Colon cancer Neg Hx    Colon polyps Neg Hx    Crohn's disease Neg Hx    Cystic fibrosis Neg Hx    Diabetes Neg Hx    Esophageal cancer Neg Hx    Heart disease Neg Hx    Hemochromatosis Neg Hx    Inflammatory bowel disease Neg Hx    Irritable bowel syndrome Neg Hx    Liver cancer Neg Hx    Liver disease Neg Hx    Ovarian cancer Neg Hx    Pancreatic cancer Neg Hx    Prostate cancer Neg Hx    Rectal cancer Neg Hx    Stomach cancer Neg Hx    Ulcerative colitis Neg Hx    Uterine cancer Neg Hx    Wilson's disease Neg Hx    Past Surgical History:  Procedure Laterality Date   COLONOSCOPY  COLONOSCOPY WITH PROPOFOL   02/28/2021   ESOPHAGOGASTRODUODENOSCOPY  06/16/2003   EYE SURGERY     INGUINAL HERNIA REPAIR     left   JOINT REPLACEMENT     KNEE ARTHROSCOPY     Left   KNEE ARTHROSCOPY W/ ACL RECONSTRUCTION     left   PENILE PROSTHESIS IMPLANT N/A 02/27/2022   Procedure: INSERTION OF INFLATABLE PENILE IMPLANT;  Surgeon: Mallie Seal, MD;  Location: [facility];  Service: Urology;  Laterality: N/A;  90 MINUTES NEEDED FOR CASE   ROTATOR CUFF REPAIR     Right x2   Stress Cardiolite  07/15/2000   TOTAL KNEE ARTHROPLASTY Right 11/04/2017   TOTAL KNEE ARTHROPLASTY Right 11/04/2017   Procedure: TOTAL KNEE ARTHROPLASTY;  Surgeon: Elly Habermann, MD;  Location: [facility] OR;  Service: Orthopedics;  Laterality: Right;   TOTAL KNEE ARTHROPLASTY Left 12/23/2017   TOTAL KNEE ARTHROPLASTY Left 12/23/2017   Procedure: TOTAL KNEE ARTHROPLASTY;  Surgeon: Elly Habermann, MD;  Location: [facility] OR;  Service: Orthopedics;  Laterality: Left;   VASECTOMY  05/2002   Social History    Occupational History   Occupation: Retired Water engineer for [employer]  Tobacco Use   Smoking status: Never   Smokeless tobacco: Never  Vaping Use   Vaping status: Never Used  Substance and Sexual Activity   Alcohol use: Yes    Alcohol/week: 1.0 standard drink of alcohol    Types: 1 Cans of beer per week    Comment: weekly   Drug use: No   Sexual activity: Not on file

## 2023-05-16 NOTE — Procedures (Signed)
 EMG & NCV Findings: Evaluation of the right median motor nerve showed prolonged distal onset latency (7.0 ms), reduced amplitude (4.6 mV), and decreased conduction velocity (Elbow-Wrist, 47 m/s).  The right median (across palm) sensory nerve showed prolonged distal peak latency (Wrist, 11.0 ms), reduced amplitude (5.9 V), and prolonged distal peak latency (Palm, 5.3 ms).  The right ulnar sensory nerve showed reduced amplitude (14.5 V).  All remaining nerves (as indicated in the following tables) were within normal limits.    All examined muscles (as indicated in the following table) showed no evidence of electrical instability.    Impression: The above electrodiagnostic study is ABNORMAL and reveals evidence of a severe right median nerve entrapment at the wrist (carpal tunnel syndrome) affecting sensory and motor components.   There is no significant electrodiagnostic evidence of any other focal nerve entrapment, brachial plexopathy or cervical radiculopathy.   Recommendations: 1.  Follow-up with referring physician. 2.  Continue current management of symptoms. 3.  Continue use of resting splint at night-time and as needed during the day. 4.  Suggest surgical evaluation.  ___________________________ Collin Deal FAAPMR Board Certified, American Board of Physical Medicine and Rehabilitation    Nerve Conduction Studies Anti Sensory Summary Table   Stim Site NR Peak (ms) Norm Peak (ms) P-T Amp (V) Norm P-T Amp Site1 Site2 Delta-P (ms) Dist (cm) Vel (m/s) Norm Vel (m/s)  Right Median Acr Palm Anti Sensory (2nd Digit)  28.2C  Wrist    *11.0 <3.6 *5.9 >10 Wrist Palm 5.7 0.0    Palm    *5.3 <2.0 14.7         Right Radial Anti Sensory (Base 1st Digit)  31.1C  Wrist    2.5 <3.1 13.8  Wrist Base 1st Digit 2.5 0.0    Right Ulnar Anti Sensory (5th Digit)  31.4C  Wrist    3.5 <3.7 *14.5 >15.0 Wrist 5th Digit 3.5 14.0 40 >38   Motor Summary Table   Stim Site NR Onset (ms) Norm Onset (ms)  O-P Amp (mV) Norm O-P Amp Site1 Site2 Delta-0 (ms) Dist (cm) Vel (m/s) Norm Vel (m/s)  Right Median Motor (Abd Poll Brev)  31.6C  Wrist    *7.0 <4.2 *4.6 >5 Elbow Wrist 5.1 24.0 *47 >50  Elbow    12.1  4.7         Right Ulnar Motor (Abd Dig Min)  31.4C  Wrist    3.0 <4.2 4.8 >3 B Elbow Wrist 3.6 21.0 58 >53  B Elbow    6.6  4.6  A Elbow B Elbow 1.6 10.0 63 >53  A Elbow    8.2  4.5          EMG   Side Muscle Nerve Root Ins Act Fibs Psw Amp Dur Poly Recrt Int Deatra Face Comment  Right Abd Poll Brev Median C8-T1 Nml Nml Nml Nml Nml 0 Nml Nml   Right 1stDorInt Ulnar C8-T1 Nml Nml Nml Nml Nml 0 Nml Nml   Right PronatorTeres Median C6-7 Nml Nml Nml Nml Nml 0 Nml Nml   Right Biceps Musculocut C5-6 Nml Nml Nml Nml Nml 0 Nml Nml     Nerve Conduction Studies Anti Sensory Left/Right Comparison   Stim Site L Lat (ms) R Lat (ms) L-R Lat (ms) L Amp (V) R Amp (V) L-R Amp (%) Site1 Site2 L Vel (m/s) R Vel (m/s) L-R Vel (m/s)  Median Acr Palm Anti Sensory (2nd Digit)  28.2C  Wrist  *11.0   *5.9  Wrist Palm     Palm  *5.3   14.7        Radial Anti Sensory (Base 1st Digit)  31.1C  Wrist  2.5   13.8  Wrist Base 1st Digit     Ulnar Anti Sensory (5th Digit)  31.4C  Wrist  3.5   *14.5  Wrist 5th Digit  40    Motor Left/Right Comparison   Stim Site L Lat (ms) R Lat (ms) L-R Lat (ms) L Amp (mV) R Amp (mV) L-R Amp (%) Site1 Site2 L Vel (m/s) R Vel (m/s) L-R Vel (m/s)  Median Motor (Abd Poll Brev)  31.6C  Wrist  *7.0   *4.6  Elbow Wrist  *47   Elbow  12.1   4.7        Ulnar Motor (Abd Dig Min)  31.4C  Wrist  3.0   4.8  B Elbow Wrist  58   B Elbow  6.6   4.6  A Elbow B Elbow  63   A Elbow  8.2   4.5           Waveforms:

## 2023-05-17 ENCOUNTER — Encounter: Payer: Self-pay | Admitting: Gastroenterology

## 2023-05-17 ENCOUNTER — Ambulatory Visit (INDEPENDENT_AMBULATORY_CARE_PROVIDER_SITE_OTHER): Payer: Medicare Other | Admitting: Gastroenterology

## 2023-05-17 ENCOUNTER — Other Ambulatory Visit (INDEPENDENT_AMBULATORY_CARE_PROVIDER_SITE_OTHER)

## 2023-05-17 ENCOUNTER — Encounter: Payer: Self-pay | Admitting: Orthopedic Surgery

## 2023-05-17 VITALS — BP 124/64 | HR 72 | Ht 68.0 in | Wt 137.0 lb

## 2023-05-17 DIAGNOSIS — E119 Type 2 diabetes mellitus without complications: Secondary | ICD-10-CM

## 2023-05-17 DIAGNOSIS — K449 Diaphragmatic hernia without obstruction or gangrene: Secondary | ICD-10-CM | POA: Diagnosis not present

## 2023-05-17 DIAGNOSIS — K458 Other specified abdominal hernia without obstruction or gangrene: Secondary | ICD-10-CM

## 2023-05-17 DIAGNOSIS — Z1211 Encounter for screening for malignant neoplasm of colon: Secondary | ICD-10-CM

## 2023-05-17 DIAGNOSIS — R748 Abnormal levels of other serum enzymes: Secondary | ICD-10-CM | POA: Diagnosis not present

## 2023-05-17 DIAGNOSIS — K5909 Other constipation: Secondary | ICD-10-CM | POA: Diagnosis not present

## 2023-05-17 DIAGNOSIS — K5900 Constipation, unspecified: Secondary | ICD-10-CM

## 2023-05-17 DIAGNOSIS — Z794 Long term (current) use of insulin: Secondary | ICD-10-CM

## 2023-05-17 LAB — HEPATIC FUNCTION PANEL
ALT: 29 U/L (ref 0–53)
AST: 40 U/L — ABNORMAL HIGH (ref 0–37)
Albumin: 4.3 g/dL (ref 3.5–5.2)
Alkaline Phosphatase: 128 U/L — ABNORMAL HIGH (ref 39–117)
Bilirubin, Direct: 0.2 mg/dL (ref 0.0–0.3)
Total Bilirubin: 0.6 mg/dL (ref 0.2–1.2)
Total Protein: 6.7 g/dL (ref 6.0–8.3)

## 2023-05-17 LAB — GAMMA GT: GGT: 85 U/L — ABNORMAL HIGH (ref 7–51)

## 2023-05-17 NOTE — Patient Instructions (Signed)
 You have been scheduled for a CT virtual colonoscopy at [facility] ( [address] location). Your appointment is scheduled for 06/20/23 at 9:40am. Please go to [facility] 4 days your exam to get your prep and instructions. If you need to reschedule your appointment or have specific questions regarding this test, please contact [facility] at [phone]. Please call at 24 hours prior to appointment if you need to cancel to avoid a 75 dollar fee.  You have been scheduled for an MRI at [facility]  on 05/24/23. Your appointment time is 12:30pm. Please arrive to admitting (at main entrance of the hospital) 30 minutes prior to your appointment time for registration purposes. Please make certain not to have anything to eat or drink 6 hours prior to your test. In addition, if you have any metal in your body, have a pacemaker or defibrillator, please be sure to let your ordering physician know. This test typically takes 45 minutes to 1 hour to complete. Should you need to reschedule, please call [phone] to do so.   Your provider has requested that you go to the basement level for lab work before leaving today. Press "B" on the elevator. The lab is located at the first door on the left as you exit the elevator.  _______________________________________________________  If your blood pressure at your visit was 140/90 or greater, please contact your primary care physician to follow up on this.  _______________________________________________________  If you are age 2 or older, your body mass index should be between 23-30. Your Body mass index is 20.83 kg/m. If this is out of the aforementioned range listed, please consider follow up with your Primary Care Provider.  If you are age 31 or younger, your body mass index should be between 19-25. Your Body mass index is 20.83 kg/m. If this is out of the aformentioned range listed, please consider follow up with your Primary Care  Provider.   ________________________________________________________  The [facility] providers would like to encourage you to use MYCHART to communicate with providers for non-urgent requests or questions.  Due to long hold times on the telephone, sending your provider a message by [facility] may be a faster and more efficient way to get a response.  Please allow 48 business hours for a response.  Please remember that this is for non-urgent requests.  _______________________________________________________   It was a pleasure to see you today!  Thank you for trusting me with your gastrointestinal care!    Scott E.Cherryl Corona, MD

## 2023-05-17 NOTE — Progress Notes (Signed)
 Discussed the use of AI scribe software for clinical note transcription with the patient, who gave verbal consent to proceed.  HPI : David Maynard is a 73 year old male who presents with elevated liver enzymes. He was referred by Dr. Daneil Dunker for evaluation of persistent high alkaline phosphatase levels.  He was previously followed by Dr. Sandrea Cruel prior to his retirement.  He has experienced elevated liver enzyme levels, specifically alkaline phosphatase, since February of the previous year following a penile implant procedure. The high levels were first noted during an annual physical shortly after the procedure.  His alk phos levels were as high as the 700s.   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  Since then, the levels have been gradually decreasing, but still remain elevated, now over 1 year later. No significant symptoms such as fatigue, jaundice, dark urine, or itching are present.  He is concerned about the financial implications of his elevated liver enzymes, as it has affected his ability to qualify for certain life insurance policies with long-term care riders. His wife has had to purchase additional insurance to cover both of them, which is financially burdensome.  He manages his diabetes with an insulin  pump and reports his A1c levels have been stable at 5.7. He is active, playing golf and tennis regularly, which he believes helps manage his blood sugar levels.  He has a history of arthritis and has had knee replacements. He also mentions having carpal tunnel syndrome, which may require surgery in the future.  He has a history of constipation, which he attributes to his medications, and manages it with Miralax  as needed. Coffee seems to aggravate his constipation.   He has had difficulty with colon cancer screening due to poor prep and a very redundant sigmoid colon which resulted in an  imcomplete colonoscopy in 2022 and again in 2023.  A subsequent barium enemas was performed and was unremarkable, although of suboptimal quality.  A successful colonoscopy in 2012 was negative for polyps.  He has no family history of colon cancer.  He mentions a past hernia surgery approximately 30 years ago, which was identified during a work-related physical examination. He notes a possible recurrence of the hernia but reports no pain or significant symptoms.      RUQUS April 27, 2022 IMPRESSION: 1. Ultrasound technologist reports gallbladder not visualized due to extensive overlying bowel gas. 2. No acute abnormality identified.  Barium enema Feb 2023 Limited exam, no mass or polyp appreciated Redundant sigmoid colon   Colonoscopy  Feb 2023 (Dr. Sandrea Cruel) Indication: Screening Aborted due to inadequate prep and technically difficult colon (redundant, looping, tortuous)   Colonoscopy Oct 2022 (Dr. Sandrea Cruel) Indication: Screening Aborted due to poor prep and technically difficult colon (redundant, looping, tortuous)   Colonoscopy Mar 2012 (Dr. Sandrea Cruel) Indication:  Screening Prominent fold/nodule on ICV, otherwise normal  Biopsies negative for dysplasia      Component Ref Range & Units (hover) 2 mo ago (03/11/23) 4 mo ago (01/07/23) 7 mo ago (10/03/22) 9 mo ago (08/08/22) 11 mo ago (06/01/22) 1 yr ago (05/02/22) 1 yr ago (04/02/22)  Total Bilirubin 0.5 0.4 0.5 0.6 0.5 0.5 0.6  Bilirubin, Direct 0.1 0.1 0.1 0.2 0.2 0.2   Alkaline Phosphatase 175 High  167 High  258 High  211 High  383 High  414 High  595 High   AST 33 33 43 High  40  High  51 High  46 High  46 High   ALT 27 27 38 33 44 41 54 High   Total Protein 6.5 6.6 6.6 6.6 6.9 6.7 6.2  Albumin 4.0 4.1 3.9 4.2 4.2 4.1 3.9    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   Cataract    removed both 05-2018 or 2021   Coronary artery disease    Depressive disorder,  not elsewhere classified    pt denies depression-   Diabetes mellitus type II    GERD (gastroesophageal reflux disease)    past hx   Hypercholesterolemia    Hypogonadism male    Primary localized osteoarthritis of left knee 12/11/2017   Primary localized osteoarthritis of right knee 10/23/2017   S/P total knee arthroplasty, 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   and left   Thyroid  disease    Unspecified hypothyroidism      Past Surgical History:  Procedure Laterality Date   COLONOSCOPY     COLONOSCOPY WITH PROPOFOL   02/28/2021   ESOPHAGOGASTRODUODENOSCOPY  06/16/2003   EYE SURGERY     INGUINAL HERNIA REPAIR     left   JOINT REPLACEMENT     KNEE ARTHROSCOPY     Left   KNEE ARTHROSCOPY W/ ACL RECONSTRUCTION     left   PENILE PROSTHESIS IMPLANT N/A 02/27/2022   Procedure: INSERTION OF INFLATABLE PENILE IMPLANT;  Surgeon: Mallie Seal, MD;  Location: [facility];  Service: Urology;  Laterality: N/A;  90 MINUTES NEEDED FOR CASE   ROTATOR CUFF REPAIR     Right x2   Stress Cardiolite  07/15/2000   TOTAL KNEE ARTHROPLASTY Right 11/04/2017   TOTAL KNEE ARTHROPLASTY Right 11/04/2017   Procedure: TOTAL KNEE ARTHROPLASTY;  Surgeon: Elly Habermann, MD;  Location: [facility] OR;  Service: Orthopedics;  Laterality: Right;   TOTAL KNEE ARTHROPLASTY Left 12/23/2017   TOTAL KNEE ARTHROPLASTY Left 12/23/2017   Procedure: TOTAL KNEE ARTHROPLASTY;  Surgeon: Elly Habermann, MD;  Location: [facility] OR;  Service: Orthopedics;  Laterality: Left;   VASECTOMY  05/2002   Family History  Problem Relation Age of Onset   Hypertension Mother    Kidney disease Father    Breast cancer Neg Hx    Celiac disease Neg Hx    Cirrhosis Neg Hx    Clotting disorder Neg Hx    Colitis Neg Hx    Colon cancer Neg Hx    Colon polyps Neg Hx    Crohn's disease Neg Hx    Cystic fibrosis Neg Hx    Diabetes Neg Hx    Esophageal cancer Neg Hx    Heart disease Neg Hx    Hemochromatosis Neg Hx    Inflammatory bowel disease Neg Hx    Irritable bowel  syndrome Neg Hx    Liver cancer Neg Hx    Liver disease Neg Hx    Ovarian cancer Neg Hx    Pancreatic cancer Neg Hx    Prostate cancer Neg Hx    Rectal cancer Neg Hx    Stomach cancer Neg Hx    Ulcerative colitis Neg Hx    Uterine cancer Neg Hx    Wilson's disease Neg Hx    Social History   Tobacco Use   Smoking status: Never   Smokeless tobacco: Never  Vaping Use   Vaping status: Never Used  Substance Use Topics   Alcohol use: Yes    Alcohol/week:  1.0 standard drink of alcohol    Types: 1 Cans of beer per week    Comment: weekly   Drug use: No   Current Outpatient Medications  Medication Sig Dispense Refill   Accu-Chek Softclix Lancets lancets Use to check blood sugar 6 times a day 600 each 2   ascorbic acid  (VITAMIN C) 500 MG tablet Take 1 tablet (500 mg total) by mouth daily. 20 tablet 0   aspirin  EC 81 MG tablet Take 81 mg by mouth daily. Swallow whole.     atorvastatin  (LIPITOR) 40 MG tablet TAKE 1 TABLET BY MOUTH DAILY 90 tablet 3   Blood Glucose Monitoring Suppl (ACCU-CHEK GUIDE ME) w/Device KIT Use to check blood sugar 6 times a day 1 kit 0   Calcium  Carbonate (CALCIUM  600 PO) Take 1 tablet by mouth daily.     Cinnamon 500 MG capsule Take 3,000 mg by mouth 2 (two) times daily.     colesevelam  (WELCHOL ) 625 MG tablet TAKE 2 TABLETS BY MOUTH TWICE  DAILY WITH A MEAL 360 tablet 3   Continuous Blood Gluc Sensor (DEXCOM G6 SENSOR) MISC 1 Device by Does not apply route See admin instructions. 1 every 10 days 9 each 3   Continuous Glucose Sensor (DEXCOM G7 SENSOR) MISC Use to check glucose continuously, change sensor every 10 days 9 each 3   docusate sodium  (COLACE) 100 MG capsule Take 100 mg by mouth 2 (two) times daily. Takes daily     Glucagon  3 MG/DOSE POWD Place 3 mg into the nose once as needed for up to 1 dose. 1 each 11   Glucosamine-Chondroit-Vit C-Mn (GLUCOSAMINE CHONDR 1500 COMPLX PO) Take 2 tablets by mouth daily.     glucose blood (ACCU-CHEK GUIDE TEST) test  strip CHECK BLOOD SUGAR 6 TIMES DAILY 600 strip 3   Insulin  Disposable Pump (OMNIPOD 5 G6 POD, GEN 5,) MISC Change pod every 72 hours. 10 each 10   Insulin  Disposable Pump (OMNIPOD 5 G7 PODS, GEN 5,) MISC 1 each by Does not apply route every 3 (three) days. 30 each 3   insulin  lispro (HUMALOG ) 100 UNIT/ML cartridge Inject 0.5 mLs (50 Units total) into the skin daily. Via pump 45 mL 3   insulin  lispro (HUMALOG ) 100 UNIT/ML injection INJECT SUBCUTANEOUSLY 50 UNITS  DAILY VIA INSULIN  PUMP 50 mL 3   Insulin  Lispro-aabc 100 UNIT/ML SOLN Inject 50 Units as directed daily. Via pump 50 mL 1   Insulin  Pen Needle 31G X 5 MM MISC 1 Device by Does not apply route in the morning and at bedtime. 200 each 3   levothyroxine  (SYNTHROID ) 88 MCG tablet TAKE 1 TABLET BY MOUTH DAILY  BEFORE BREAKFAST 90 tablet 1   meloxicam  (MOBIC ) 15 MG tablet TAKE 1 TABLET(15 MG) BY MOUTH DAILY 30 tablet 0   metFORMIN  (GLUCOPHAGE -XR) 500 MG 24 hr tablet TAKE 2 TABLETS BY MOUTH TWICE  DAILY 360 tablet 3   Multiple Vitamin (MULTIVITAMIN) tablet Take 1 tablet by mouth daily.     MYRBETRIQ  50 MG TB24 tablet Take 50 mg by mouth daily.     omega-3 acid ethyl esters (LOVAZA ) 1 g capsule TAKE 2 CAPSULES BY MOUTH TWICE  DAILY 360 capsule 3   Semaglutide ,0.25 or 0.5MG /DOS, (OZEMPIC , 0.25 OR 0.5 MG/DOSE,) 2 MG/3ML SOPN Inject under skin 0.5 mg weekly under skin 9 mL 3   SYRINGE-NEEDLE, DISP, 3 ML (B-D 3CC LUER-LOK SYR 25GX1") 25G X 1" 3 ML MISC USE AS DIRECTED MONTHLY FOR B-12 INJECTIONS 3  each 0   tadalafil  (ADCIRCA /CIALIS ) 20 MG tablet Take 20 mg by mouth daily as needed for erectile dysfunction.     terbinafine  (LAMISIL ) 250 MG tablet Take 1 tablet (250 mg total) by mouth daily. 90 tablet 0   Trospium Chloride 60 MG CP24 Take 60 mg by mouth daily.     Turmeric 500 MG CAPS Take 500-1,000 mg by mouth daily. May take an extra dose if plays golf or tennis     VITAMIN D , CHOLECALCIFEROL , PO Take 1 tablet by mouth daily.     vitamin E 200 UNIT  capsule Take 200 Units by mouth daily.     No current facility-administered medications for this visit.   Allergies  Allergen Reactions   Bactrim  [Sulfamethoxazole -Trimethoprim ]      Review of Systems: All systems reviewed and negative except where noted in HPI.    XR Wrist Complete Right Result Date: 04/24/2023 Radiographs of his right wrist were reviewed today.  He has significant degenerative changes in his wrist especially on the radial side also in the first [facility].  Cannot appreciate any acute fractures   Physical Exam: BP 124/64   Pulse 72   Ht 5\' 8"  (1.727 m)   Wt 137 lb (62.1 kg)   BMI 20.83 kg/m  Constitutional: Pleasant,well-developed, Caucasian male in no acute distress. HEENT: Normocephalic and atraumatic. Conjunctivae are normal. No scleral icterus. Neck supple.  Cardiovascular: Normal rate, regular rhythm.  Pulmonary/chest: Effort normal and breath sounds normal. No wheezing, rales or rhonchi. Abdominal: Soft, nondistended, nontender. Bowel sounds active throughout. There are no masses palpable. No hepatomegaly.  Large, soft, reducible bulge in LLQ.  At the inferior border of this bulge, there was a small, firm area appreciated.  Nontender Extremities: no edema Lymphadenopathy: No cervical adenopathy noted. Neurological: Alert and oriented to person place and time. Skin: Skin is warm and dry. No rashes noted. Psychiatric: Normal mood and affect. Behavior is normal.  CBC    Component Value Date/Time   WBC 7.4 03/19/2022 1407   RBC 4.43 03/19/2022 1407   HGB 12.9 (L) 03/19/2022 1407   HCT 39.0 03/19/2022 1407   PLT 254.0 03/19/2022 1407   MCV 87.9 03/19/2022 1407   MCH 28.9 02/21/2022 1058   MCHC 33.1 03/19/2022 1407   RDW 14.9 03/19/2022 1407   LYMPHSABS 0.6 (L) 10/01/2019 0015   MONOABS 0.4 10/01/2019 0015   EOSABS 0.1 10/01/2019 0015   BASOSABS 0.0 10/01/2019 0015    CMP     Component Value Date/Time   NA 130 (L) 04/02/2022 1108   K 4.5  04/02/2022 1108   CL 95 (L) 04/02/2022 1108   CO2 27 04/02/2022 1108   GLUCOSE 126 (H) 04/02/2022 1108   BUN 15 04/02/2022 1108   CREATININE 0.80 04/02/2022 1108   CALCIUM  9.5 04/02/2022 1108   PROT 6.5 03/11/2023 0921   ALBUMIN 4.0 03/11/2023 0921   AST 33 03/11/2023 0921   ALT 27 03/11/2023 0921   ALKPHOS 175 (H) 03/11/2023 0921   BILITOT 0.5 03/11/2023 0921   GFRNONAA >60 02/21/2022 1058   GFRAA >60 10/01/2019 0015       Latest Ref Rng & Units 03/19/2022    2:07 PM 02/21/2022   10:58 AM 03/13/2021    1:54 PM  CBC EXTENDED  WBC 4.0 - 10.5 K/uL 7.4  8.8  6.1   RBC 4.22 - 5.81 Mil/uL 4.43  4.39  4.15   Hemoglobin 13.0 - 17.0 g/dL 40.9  81.1  12.5  HCT 39.0 - 52.0 % 39.0  39.1  36.9   Platelets 150.0 - 400.0 K/uL 254.0  213  183.0       ASSESSMENT AND PLAN:  73 year old male with elevated liver enzymes, first noted in Feb 2024 following surgery, alk phos predominant, with no prior history of elevated liver enzymes.  His enzymes have continued to downtrend, although his alkaline phosphatase remains elevated over a year later.  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 Differential diagnosis includes autoimmune conditions of the liver and bile ducts, such as primary sclerosing cholangitis and primary biliary cholangitis, though this is considered less likely than a slowly resolving DILI. No symptoms such as fatigue, jaundice, dark urine, or pruritus reported. He is motivated to resolve this issue due to its impact on life insurance eligibility. - Check serologies for PBC and AIH.  Recheck liver enzymes and GGT. - Order MRCP to assess bile ducts for primary sclerosing cholangitis.  Colon cancer screening He has had two unsuccessful colonoscopies and a barium enema in 2023, which is not a reliable screening test. He is considered average risk  for colon cancer. Options for future screening include a stool-based test or a virtual colonoscopy. I recommended a virtual colonoscopy and he was agreeable. - Order virtual colonoscopy after his trip to [place], pending insurance coverage. - Consider stool-based test if virtual colonoscopy is not feasible.  Left lower quadrant hernia on exam, asymptomatic; history of remote hernia repair Can be further assessed on virtual colonoscopy.  Suspect firm lesion palpated on inferior aspect of hernia is calcification   Constipation Chronic constipation, possibly exacerbated by medications such as Ozempic . He reports daily bowel movements with effort and occasional use of Miralax . He is considering reducing coffee intake as it may aggravate constipation. - Continue Miralax  as needed for constipation management.  Type 2 diabetes mellitus Well-controlled Type 2 diabetes mellitus with A1c of 5.7. He uses an insulin  pump and actively manages condition.  Danicia Terhaar E. Cherryl Corona, MD [facility]  I spent a total of 40 minutes reviewing the patient's medical record, interviewing and examining the patient, discussing his diagnosis and management of his condition going forward, and documenting in the medical record   Daneil Dunker, Jinny Mounts, MD

## 2023-05-20 LAB — IGG: IgG (Immunoglobin G), Serum: 796 mg/dL (ref 600–1540)

## 2023-05-20 LAB — ANA: Anti Nuclear Antibody (ANA): NEGATIVE

## 2023-05-20 LAB — ANTI-SMOOTH MUSCLE ANTIBODY, IGG: Actin (Smooth Muscle) Antibody (IGG): <20 U (ref <20)

## 2023-05-20 LAB — MITOCHONDRIAL ANTIBODIES: Mitochondrial M2 Ab, IgG: <=20 U (ref <=20.0)

## 2023-05-21 ENCOUNTER — Ambulatory Visit (INDEPENDENT_AMBULATORY_CARE_PROVIDER_SITE_OTHER): Admitting: Orthopedic Surgery

## 2023-05-21 DIAGNOSIS — M25531 Pain in right wrist: Secondary | ICD-10-CM

## 2023-05-21 DIAGNOSIS — G5601 Carpal tunnel syndrome, right upper limb: Secondary | ICD-10-CM

## 2023-05-21 NOTE — Progress Notes (Signed)
 David Maynard - 73 y.o. male MRN [id number]  Date of birth: Jun 21, 1950  Office Visit Note: Visit Date: 05/21/2023 PCP: Rodney Clamp, MD Referred by: Rodney Clamp, MD  Subjective: No chief complaint on file.  HPI: David Maynard is a pleasant 73 y.o. male who presents today for follow up of ongoing right wrist pain/stiffness with associated numbness tingling.  He has been seen previously by Dr. Abigail Abler at [facility] who performed carpal tunnel injection earlier this year which did provide him some relief of the numbness and tingling.  His wrist pain is persistent however, he feels its mostly related to activity and usage, he does play a fair amount of tennis which he feels may have contributed over time.  Denies any prior injury to the wrist.  Has been utilizing a wrist brace which does involve the thumb mostly at night with mild relief.  Does have ongoing nocturnal symptoms.  He underwent recent electrodiagnostic testing of the right upper extremity which does show severe carpal tunnel syndrome of the right side consistent with his clinical examination.  Pertinent ROS were reviewed with the patient and found to be negative unless otherwise specified above in HPI.   Visit Reason: right wrist and hand Duration of symptoms: 1 year Hand dominance: right Occupation: retired Diabetic: Yes / 5.7 Smoking: No Heart/Lung History: yes Blood Thinners:  aspirin   Prior Testing/EMG: 04/24/23 Injections (Date): 03/18/23  Assessment & Plan: Visit Diagnoses:  1. Carpal tunnel syndrome of right wrist   2. Pain in right wrist      Plan: Extensive discussion was had with the patient today about his ongoing right sided carpal tunnel syndrome that is refractory to conservative care.  Patient has both clinical and electrodiagnostic evidence to confirm this diagnosis.  At this juncture, he is indicated for right open versus endoscopic carpal tunnel release.  Risks and benefits of both operations  as well as forms of anesthesia were discussed in detail today.  He would like to give this further thought and will contact moving forward regarding his choice regarding open versus endoscopic surgery.  Risks include but not limited to infection, bleeding, scarring, stiffness, nerve injury or vascular, tendon injury, risk of recurrence and need for subsequent operation were all discussed in detail.  Patient consented understanding the above.  As previously discussed, there does remain significant degenerative changes at both the wrist level and the thumb CMC interval.  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  Given the advanced nature of this arthritis, I did discuss with him that there would be the option for potential salvage procedures in the future such as scaphoidectomy with 4 corner fusion, proximal row carpectomy with allograft spacer or potential wrist arthroplasty versus fusion.  However, I did explain that with these salvage procedures, there would be potential loss of some strength and motion long-term to the wrist, which has been relatively well-maintained despite his arthritic changes.  From this standpoint, I discussed that we would continue with conservative measures for the time being as long as we can keep him fairly pain-free.  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  He also has continues to have cystic-like mass on examination today over the thumb CMC interval with associated tenderness.  I explained that there is a separate procedure with trapeziectomy  and potential stabilization of the thumb metacarpal which could be performed as well if this area becomes more symptomatic compared to the wrist in the future.   Follow-up: No follow-ups on file.   Meds & Orders: No orders of the defined  types were placed in this encounter.   No orders of the defined types were placed in this encounter.    Procedures: No procedures performed      Clinical History: No specialty comments available.  He reports that he has never smoked. He has never used smokeless tobacco.  Recent Labs    09/10/22 0000 03/11/23 1429  HGBA1C 5.8 5.7*    Objective:   Vital Signs: There were no vitals taken for this visit.  Physical Exam  Gen: Well-appearing, in no acute distress; non-toxic CV: Regular Rate. Well-perfused. Warm.  Resp: Breathing unlabored on room air; no wheezing. Psych: Fluid speech in conversation; appropriate affect; normal thought process  Ortho Exam Right wrist: - Notable swelling dorsally over the wrist and thumb CMC interval, cystic-like structure over the thumb CMC as well, soft, compressible, slightly mobile - Pain elicited with deep palpation at the radiocarpal joint and the thumb CMC joint, positive thumb CMC grind for pain and crepitus, no significant MP hyperextension - Able to form composite fist - Sensation diminished in the median nerve distribution, no significant thenar atrophy, APB 5/5 - AIN/PIN/interosseous intact - Notable clunk with Azalia Leo shift testing at the wrist - Positive Tinel's carpal tunnel, positive Phalen's, positive Durkan's compression  Imaging: No results found.  Past Medical/Family/Surgical/Social History: Medications & Allergies reviewed per EMR, new medications updated. Patient Active Problem List   Diagnosis Date Noted   Elevated alkaline phosphatase level 12/05/2022   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   Umbilical hernia without obstruction and without gangrene 02/23/2020   Gastroesophageal reflux disease 01/13/2019   BPH  01/08/2018   ED (erectile dysfunction) 01/08/2018   OAB (overactive bladder) 01/08/2018   History of bilateral knee replacement 12/11/2017   Lumbar disc disease 09/22/2010   T2DM (type 2 diabetes  mellitus) (HCC)    Hypogonadism male 06/19/2007   Dyslipidemia associated with type 2 diabetes mellitus (HCC) 06/19/2007   Hypothyroidism 03/05/2007   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   and left   Thyroid  disease    Unspecified hypothyroidism    Family History  Problem Relation Age of Onset   Hypertension Mother    Kidney disease Father    Breast cancer Neg Hx    Celiac disease Neg Hx    Cirrhosis Neg Hx    Clotting disorder Neg Hx    Colitis Neg Hx    Colon cancer Neg Hx    Colon polyps Neg Hx    Crohn's disease Neg Hx    Cystic fibrosis Neg Hx    Diabetes Neg Hx    Esophageal cancer Neg Hx    Heart disease Neg Hx  Hemochromatosis Neg Hx    Inflammatory bowel disease Neg Hx    Irritable bowel syndrome Neg Hx    Liver cancer Neg Hx    Liver disease Neg Hx    Ovarian cancer Neg Hx    Pancreatic cancer Neg Hx    Prostate cancer Neg Hx    Rectal cancer Neg Hx    Stomach cancer Neg Hx    Ulcerative colitis Neg Hx    Uterine cancer Neg Hx    Wilson's disease Neg Hx    Past Surgical History:  Procedure Laterality Date   COLONOSCOPY     COLONOSCOPY WITH PROPOFOL   02/28/2021   ESOPHAGOGASTRODUODENOSCOPY  06/16/2003   EYE SURGERY     INGUINAL HERNIA REPAIR     left   JOINT REPLACEMENT     KNEE ARTHROSCOPY     Left   KNEE ARTHROSCOPY W/ ACL RECONSTRUCTION     left   PENILE PROSTHESIS IMPLANT N/A 02/27/2022   Procedure: INSERTION OF INFLATABLE PENILE IMPLANT;   Surgeon: Mallie Seal, MD;  Location: [facility];  Service: Urology;  Laterality: N/A;  90 MINUTES NEEDED FOR CASE   ROTATOR CUFF REPAIR     Right x2   Stress Cardiolite  07/15/2000   TOTAL KNEE ARTHROPLASTY Right 11/04/2017   TOTAL KNEE ARTHROPLASTY Right 11/04/2017   Procedure: TOTAL KNEE ARTHROPLASTY;  Surgeon: Elly Habermann, MD;  Location: [facility] OR;  Service: Orthopedics;  Laterality: Right;   TOTAL KNEE ARTHROPLASTY Left 12/23/2017   TOTAL KNEE ARTHROPLASTY Left 12/23/2017   Procedure: TOTAL KNEE ARTHROPLASTY;  Surgeon: Elly Habermann, MD;  Location: [facility] OR;  Service: Orthopedics;  Laterality: Left;   VASECTOMY  05/2002   Social History   Occupational History   Occupation: Retired Water engineer for [employer]  Tobacco Use   Smoking status: Never   Smokeless tobacco: Never  Vaping Use   Vaping status: Never Used  Substance and Sexual Activity   Alcohol use: Yes    Alcohol/week: 1.0 standard drink of alcohol    Types: 1 Cans of beer per week    Comment: weekly   Drug use: No   Sexual activity: Not on file    Julieth Tugman Merlinda Starling) Marce Sensing, M.D. [facility], Hand Surgery

## 2023-05-24 ENCOUNTER — Other Ambulatory Visit: Payer: Self-pay | Admitting: Gastroenterology

## 2023-05-24 ENCOUNTER — Telehealth: Payer: Self-pay | Admitting: Orthopedic Surgery

## 2023-05-24 ENCOUNTER — Ambulatory Visit (HOSPITAL_COMMUNITY)
Admission: RE | Admit: 2023-05-24 | Discharge: 2023-05-24 | Disposition: A | Discharge status: Home / Self Care | Source: Ambulatory Visit | Attending: Gastroenterology

## 2023-05-24 DIAGNOSIS — R748 Abnormal levels of other serum enzymes: Secondary | ICD-10-CM

## 2023-05-24 MED ORDER — GADOBUTROL 1 MMOL/ML IV SOLN
6.0000 mL | Freq: Once | INTRAVENOUS | Status: AC | PRN
  Administered 2023-05-24: 6 mL via INTRAVENOUS

## 2023-05-24 NOTE — Telephone Encounter (Signed)
 I spoke with the patient and scheduled his surgery for 05/30/23.

## 2023-05-24 NOTE — Telephone Encounter (Signed)
 I spoke with the patient and scheduled his surgery on his right hand for 5/8. The patient would like to know what medication he will be given for after surgery, in case he is experiencing any pain? Patient requests you respond thru My Chart if you would.

## 2023-05-27 ENCOUNTER — Encounter: Admitting: Family Medicine

## 2023-05-27 ENCOUNTER — Telehealth: Payer: Self-pay

## 2023-05-27 NOTE — Telephone Encounter (Signed)
 Copied from CRM [id number]. Topic: General - Running Late >> May 27, 2023  1:42 PM Caliyah H wrote: Patient called to inform the office that he is en route for his 1:20 appointment today.  Noted; pt was to late for appt in office 05/27/23 and rescheduled for 05/31/23 by front office.

## 2023-05-29 ENCOUNTER — Encounter: Payer: Self-pay | Admitting: Gastroenterology

## 2023-05-29 NOTE — Progress Notes (Signed)
 David Maynard,  The blood tests looking for autoimmune liver disease were normal.   Your liver enzymes improved from 2 months ago and were almost within normal range.  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

## 2023-05-30 DIAGNOSIS — G5601 Carpal tunnel syndrome, right upper limb: Secondary | ICD-10-CM | POA: Diagnosis not present

## 2023-05-31 ENCOUNTER — Encounter: Payer: Self-pay | Admitting: Family Medicine

## 2023-05-31 ENCOUNTER — Ambulatory Visit (INDEPENDENT_AMBULATORY_CARE_PROVIDER_SITE_OTHER): Admitting: Family Medicine

## 2023-05-31 VITALS — BP 127/78 | HR 88 | Temp 98.2°F | Ht 68.0 in | Wt 137.4 lb

## 2023-05-31 DIAGNOSIS — Z794 Long term (current) use of insulin: Secondary | ICD-10-CM

## 2023-05-31 DIAGNOSIS — N401 Enlarged prostate with lower urinary tract symptoms: Secondary | ICD-10-CM

## 2023-05-31 DIAGNOSIS — E039 Hypothyroidism, unspecified: Secondary | ICD-10-CM

## 2023-05-31 DIAGNOSIS — Z0000 Encounter for general adult medical examination without abnormal findings: Secondary | ICD-10-CM

## 2023-05-31 DIAGNOSIS — R748 Abnormal levels of other serum enzymes: Secondary | ICD-10-CM

## 2023-05-31 DIAGNOSIS — Z0001 Encounter for general adult medical examination with abnormal findings: Secondary | ICD-10-CM

## 2023-05-31 DIAGNOSIS — E1169 Type 2 diabetes mellitus with other specified complication: Secondary | ICD-10-CM | POA: Diagnosis not present

## 2023-05-31 DIAGNOSIS — E119 Type 2 diabetes mellitus without complications: Secondary | ICD-10-CM

## 2023-05-31 DIAGNOSIS — E785 Hyperlipidemia, unspecified: Secondary | ICD-10-CM

## 2023-05-31 NOTE — Assessment & Plan Note (Signed)
 Follows with endocrinology.  On insulin  pump.  His blood sugar has been well-controlled.  Recheck A1c today.

## 2023-05-31 NOTE — Patient Instructions (Addendum)
 It was very nice to see you today!  We will check blood work today.   Please continue to work on diet and exercise.  I will see back in year for your next physical.  Please come back sooner if needed.    Return in about 1 year (around 05/30/2024) for Annual Physical.   Take care, Dr Daneil Dunker  PLEASE NOTE:  If you had any lab tests, please let us  know if you have not heard back within a few days. You may see your results on mychart before we have a chance to review them but we will give you a call once they are reviewed by us .   If we ordered any referrals today, please let us  know if you have not heard from their office within the next week.   If you had any urgent prescriptions sent in today, please check with the pharmacy within an hour of our visit to make sure the prescription was transmitted appropriately.   Please try these tips to maintain a healthy lifestyle:  Eat at least 3 REAL meals and 1-2 snacks per day.  Aim for no more than 5 hours between eating.  If you eat breakfast, please do so within one hour of getting up.   Each meal should contain half fruits/vegetables, one quarter protein, and one quarter carbs (no bigger than a computer mouse)  Cut down on sweet beverages. This includes juice, soda, and sweet tea.   Drink at least 1 glass of water  with each meal and aim for at least 8 glasses per day  Exercise at least 150 minutes every week.    Preventive Care 86 Years and Older, Male Preventive care refers to lifestyle choices and visits with your health care provider that can promote health and wellness. Preventive care visits are also called wellness exams. What can I expect for my preventive care visit? Counseling During your preventive care visit, your health care provider may ask about your: Medical history, including: Past medical problems. Family medical history. History of falls. Current health, including: Emotional well-being. Home life and relationship  well-being. Sexual activity. Memory and ability to understand (cognition). Lifestyle, including: Alcohol, nicotine or tobacco, and drug use. Access to firearms. Diet, exercise, and sleep habits. Work and work Astronomer. Sunscreen use. Safety issues such as seatbelt and bike helmet use. Physical exam Your health care provider will check your: Height and weight. These may be used to calculate your BMI (body mass index). BMI is a measurement that tells if you are at a healthy weight. Waist circumference. This measures the distance around your waistline. This measurement also tells if you are at a healthy weight and may help predict your risk of certain diseases, such as type 2 diabetes and high blood pressure. Heart rate and blood pressure. Body temperature. Skin for abnormal spots. What immunizations do I need?  Vaccines are usually given at various ages, according to a schedule. Your health care provider will recommend vaccines for you based on your age, medical history, and lifestyle or other factors, such as travel or where you work. What tests do I need? Screening Your health care provider may recommend screening tests for certain conditions. This may include: Lipid and cholesterol levels. Diabetes screening. This is done by checking your blood sugar (glucose) after you have not eaten for a while (fasting). Hepatitis C test. Hepatitis B test. HIV (human immunodeficiency virus) test. STI (sexually transmitted infection) testing, if you are at risk. Lung cancer screening. Colorectal cancer  screening. Prostate cancer screening. Abdominal aortic aneurysm (AAA) screening. You may need this if you are a current or former smoker. Talk with your health care provider about your test results, treatment options, and if necessary, the need for more tests. Follow these instructions at home: Eating and drinking  Eat a diet that includes fresh fruits and vegetables, whole grains, lean  protein, and low-fat dairy products. Limit your intake of foods with high amounts of sugar, saturated fats, and salt. Take vitamin and mineral supplements as recommended by your health care provider. Do not drink alcohol if your health care provider tells you not to drink. If you drink alcohol: Limit how much you have to 0-2 drinks a day. Know how much alcohol is in your drink. In the [place] one drink equals one 12 oz bottle of beer (355 mL), one 5 oz glass of wine (148 mL), or one 1 oz glass of hard liquor (44 mL). Lifestyle Brush your teeth every morning and night with fluoride toothpaste. Floss one time each day. Exercise for at least 30 minutes 5 or more days each week. Do not use any products that contain nicotine or tobacco. These products include cigarettes, chewing tobacco, and vaping devices, such as e-cigarettes. If you need help quitting, ask your health care provider. Do not use drugs. If you are sexually active, practice safe sex. Use a condom or other form of protection to prevent STIs. Take aspirin  only as told by your health care provider. Make sure that you understand how much to take and what form to take. Work with your health care provider to find out whether it is safe and beneficial for you to take aspirin  daily. Ask your health care provider if you need to take a cholesterol-lowering medicine (statin). Find healthy ways to manage stress, such as: Meditation, yoga, or listening to music. Journaling. Talking to a trusted person. Spending time with friends and family. Safety Always wear your seat belt while driving or riding in a vehicle. Do not drive: If you have been drinking alcohol. Do not ride with someone who has been drinking. When you are tired or distracted. While texting. If you have been using any mind-altering substances or drugs. Wear a helmet and other protective equipment during sports activities. If you have firearms in your house, make sure you follow  all gun safety procedures. Minimize exposure to UV radiation to reduce your risk of skin cancer. What's next? Visit your health care provider once a year for an annual wellness visit. Ask your health care provider how often you should have your eyes and teeth checked. Stay up to date on all vaccines. This information is not intended to replace advice given to you by your health care provider. Make sure you discuss any questions you have with your health care provider. Document Revised: 07/06/2020 Document Reviewed: 07/06/2020 Elsevier Patient Education  2024 [employer].

## 2023-05-31 NOTE — Assessment & Plan Note (Signed)
 Follows with urology.  Has had intermittent elevated PSAs in the past.  Will recheck PSA today.

## 2023-05-31 NOTE — Progress Notes (Signed)
 Chief Complaint:  David Maynard is a 73 y.o. male who presents today for his annual comprehensive physical exam.    Assessment/Plan:  Chronic Problems Addressed Today: Hypothyroidism Follows with endocrinology.  Will check TSH today.  He is on Synthroid  88 mcg daily.  Dyslipidemia associated with type 2 diabetes mellitus (HCC) Check lipids.  Doing well with Lipitor 40 mg daily.  T2DM (type 2 diabetes mellitus) (HCC) Follows with endocrinology.  On insulin  pump.  His blood sugar has been well-controlled.  Recheck A1c today.  BPH  Follows with urology.  Has had intermittent elevated PSAs in the past.  Will recheck PSA today.  Elevated alkaline phosphatase level Has had extensive workup with GI since our last visit including MRI a few days ago.  His workup has essentially been negative.  It was thought that his elevated alk phos was related to use of Bactrim .  Will recheck c-Met today though defer further management to GI.  Preventative Healthcare: Check labs.  Up-to-date on colon cancer screening.  Up-to-date on vaccines.  Patient Counseling(The following topics were reviewed and/or handout was given):  -Nutrition: Stressed importance of moderation in sodium/caffeine intake, saturated fat and cholesterol, caloric balance, sufficient intake of fresh fruits, vegetables, and fiber.  -Stressed the importance of regular exercise.   -Substance Abuse: Discussed cessation/primary prevention of tobacco, alcohol, or other drug use; driving or other dangerous activities under the influence; availability of treatment for abuse.   -Injury prevention: Discussed safety belts, safety helmets, smoke detector, smoking near bedding or upholstery.   -Sexuality: Discussed sexually transmitted diseases, partner selection, use of condoms, avoidance of unintended pregnancy and contraceptive alternatives.   -Dental health: Discussed importance of regular tooth brushing, flossing, and dental visits.  -Health  maintenance and immunizations reviewed. Please refer to Health maintenance section.  Return to care in 1 year for next preventative visit.     Subjective:  HPI:  He has no acute complaints today.  See A/P for status of chronic conditions.  Patient is here today for annual visit.  Doing well today.  No acute concerns.  Has been dealing with carpal tunnel syndrome is following with orthopedics for this.  Recently had procedure performed.  Recovering well.     05/31/2023    1:55 PM  Depression screen PHQ 2/9  Decreased Interest 0  Down, Depressed, Hopeless 0  PHQ - 2 Score 0    Health Maintenance Due  Topic Date Due   OPHTHALMOLOGY EXAM  11/28/2022   Diabetic kidney evaluation - eGFR measurement  04/02/2023   COVID-19 Vaccine (9 - Mixed Product risk 2024-25 season) 04/23/2023     ROS: Per HPI, otherwise a complete review of systems was negative.   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   and left   Thyroid  disease    Unspecified hypothyroidism    Patient Active  Problem List   Diagnosis Date Noted   Elevated alkaline phosphatase level 12/05/2022   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   Umbilical hernia without obstruction and without gangrene 02/23/2020   Gastroesophageal reflux disease 01/13/2019   BPH  01/08/2018   ED (erectile dysfunction) 01/08/2018   OAB (overactive bladder) 01/08/2018   History of  bilateral knee replacement 12/11/2017   Lumbar disc disease 09/22/2010   T2DM (type 2 diabetes mellitus) (HCC)    Hypogonadism male 06/19/2007   Dyslipidemia associated with type 2 diabetes mellitus (HCC) 06/19/2007   Hypothyroidism 03/05/2007   Past Surgical History:  Procedure Laterality Date   COLONOSCOPY     COLONOSCOPY WITH PROPOFOL   02/28/2021   ESOPHAGOGASTRODUODENOSCOPY  06/16/2003   EYE SURGERY     INGUINAL HERNIA REPAIR     left   JOINT REPLACEMENT     KNEE ARTHROSCOPY     Left   KNEE ARTHROSCOPY W/ ACL RECONSTRUCTION     left   PENILE PROSTHESIS IMPLANT N/A 02/27/2022   Procedure: INSERTION OF INFLATABLE PENILE IMPLANT;  Surgeon: Mallie Seal, MD;  Location: [facility];  Service: Urology;  Laterality: N/A;  90 MINUTES NEEDED FOR CASE   ROTATOR CUFF REPAIR     Right x2   Stress Cardiolite  07/15/2000   TOTAL KNEE ARTHROPLASTY Right 11/04/2017   TOTAL KNEE ARTHROPLASTY Right 11/04/2017   Procedure: TOTAL KNEE ARTHROPLASTY;  Surgeon: Elly Habermann, MD;  Location: [facility] OR;  Service: Orthopedics;  Laterality: Right;   TOTAL KNEE ARTHROPLASTY Left 12/23/2017   TOTAL KNEE ARTHROPLASTY Left 12/23/2017   Procedure: TOTAL KNEE ARTHROPLASTY;  Surgeon: Elly Habermann, MD;  Location: [facility] OR;  Service: Orthopedics;  Laterality: Left;   VASECTOMY  05/2002    Family History  Problem Relation Age of Onset   Hypertension Mother    Kidney disease Father    Breast cancer Neg Hx    Celiac disease Neg Hx    Cirrhosis Neg Hx    Clotting disorder Neg Hx    Colitis Neg Hx    Colon cancer Neg Hx    Colon polyps Neg Hx    Crohn's disease Neg Hx    Cystic fibrosis Neg Hx    Diabetes Neg Hx    Esophageal cancer Neg Hx    Heart disease Neg Hx    Hemochromatosis Neg Hx    Inflammatory bowel disease Neg Hx    Irritable bowel syndrome Neg Hx    Liver cancer Neg Hx    Liver disease Neg Hx    Ovarian cancer Neg Hx    Pancreatic cancer Neg Hx    Prostate cancer Neg Hx    Rectal cancer  Neg Hx    Stomach cancer Neg Hx    Ulcerative colitis Neg Hx    Uterine cancer Neg Hx    Wilson's disease Neg Hx     Medications- reviewed and updated Current Outpatient Medications  Medication Sig Dispense Refill   Accu-Chek Softclix Lancets lancets Use to check blood sugar 6 times a day 600 each 2   ascorbic acid  (VITAMIN C) 500 MG tablet Take 1 tablet (500 mg total) by mouth daily. 20 tablet 0   aspirin  EC 81 MG tablet Take 81 mg by mouth daily. Swallow whole.     atorvastatin  (LIPITOR) 40 MG tablet TAKE 1 TABLET BY MOUTH DAILY 90 tablet 3   Blood Glucose Monitoring Suppl (ACCU-CHEK GUIDE ME) w/Device KIT Use to check blood sugar 6 times a day  1 kit 0   Calcium  Carbonate (CALCIUM  600 PO) Take 1 tablet by mouth daily.     Cinnamon 500 MG capsule Take 3,000 mg by mouth 2 (two) times daily.     colesevelam  (WELCHOL ) 625 MG tablet TAKE 2 TABLETS BY MOUTH TWICE  DAILY WITH A MEAL 360 tablet 3   Continuous Glucose Sensor (DEXCOM G7 SENSOR) MISC Use to check glucose continuously, change sensor every 10 days 9 each 3   docusate sodium  (COLACE) 100 MG capsule Take 100 mg by mouth 2 (two) times daily. Takes daily     Glucagon  3 MG/DOSE POWD Place 3 mg into the nose once as needed for up to 1 dose. 1 each 11   Glucosamine-Chondroit-Vit C-Mn (GLUCOSAMINE CHONDR 1500 COMPLX PO) Take 2 tablets by mouth daily.     glucose blood (ACCU-CHEK GUIDE TEST) test strip CHECK BLOOD SUGAR 6 TIMES DAILY 600 strip 3   Insulin  Disposable Pump (OMNIPOD 5 G7 PODS, GEN 5,) MISC 1 each by Does not apply route every 3 (three) days. 30 each 3   insulin  lispro (HUMALOG ) 100 UNIT/ML cartridge Inject 0.5 mLs (50 Units total) into the skin daily. Via pump 45 mL 3   insulin  lispro (HUMALOG ) 100 UNIT/ML injection INJECT SUBCUTANEOUSLY 50 UNITS  DAILY VIA INSULIN  PUMP (Patient taking differently: INJECT SUBCUTANEOUSLY 50 UNITS  DAILY VIA INSULIN  PUMP) 50 mL 3   Insulin  Lispro-aabc 100 UNIT/ML SOLN Inject 50 Units as directed  daily. Via pump 50 mL 1   Insulin  Pen Needle 31G X 5 MM MISC 1 Device by Does not apply route in the morning and at bedtime. 200 each 3   levothyroxine  (SYNTHROID ) 88 MCG tablet TAKE 1 TABLET BY MOUTH DAILY  BEFORE BREAKFAST 90 tablet 1   meloxicam  (MOBIC ) 15 MG tablet TAKE 1 TABLET(15 MG) BY MOUTH DAILY 30 tablet 0   metFORMIN  (GLUCOPHAGE -XR) 500 MG 24 hr tablet TAKE 2 TABLETS BY MOUTH TWICE  DAILY 360 tablet 3   Multiple Vitamin (MULTIVITAMIN) tablet Take 1 tablet by mouth daily.     MYRBETRIQ  50 MG TB24 tablet Take 50 mg by mouth daily.     omega-3 acid ethyl esters (LOVAZA ) 1 g capsule TAKE 2 CAPSULES BY MOUTH TWICE  DAILY 360 capsule 3   Semaglutide ,0.25 or 0.5MG /DOS, (OZEMPIC , 0.25 OR 0.5 MG/DOSE,) 2 MG/3ML SOPN Inject under skin 0.5 mg weekly under skin 9 mL 3   tadalafil  (ADCIRCA /CIALIS ) 20 MG tablet Take 20 mg by mouth daily as needed for erectile dysfunction.     Trospium Chloride 60 MG CP24 Take 60 mg by mouth daily.     Turmeric 500 MG CAPS Take 500-1,000 mg by mouth daily. May take an extra dose if plays golf or tennis     VITAMIN D , CHOLECALCIFEROL , PO Take 1 tablet by mouth daily.     vitamin E 200 UNIT capsule Take 200 Units by mouth daily.     No current facility-administered medications for this visit.    Allergies-reviewed and updated Allergies  Allergen Reactions   Bactrim  [Sulfamethoxazole -Trimethoprim ]     Social History   Socioeconomic History   Marital status: Married    Spouse name: Not on file   Number of children: 2   Years of education: Not on file   Highest education level: Bachelor's degree (e.g., BA, AB, BS)  Occupational History   Occupation: Retired Water engineer for [employer]  Tobacco Use   Smoking status: Never   Smokeless tobacco: Never  Vaping  Use   Vaping status: Never Used  Substance and Sexual Activity   Alcohol use: Yes    Alcohol/week: 1.0 standard drink of alcohol    Types: 1 Cans of beer per week    Comment: weekly   Drug use:  No   Sexual activity: Not on file  Other Topics Concern   Not on file  Social History Narrative   Not on file   Social Drivers of Health   Financial Resource Strain: Low Risk  (05/24/2023)   Overall Financial Resource Strain (CARDIA)    Difficulty of Paying Living Expenses: Not hard at all  Food Insecurity: No Food Insecurity (05/24/2023)   Hunger Vital Sign    Worried About Running Out of Food in the Last Year: Never true    Ran Out of Food in the Last Year: Never true  Transportation Needs: No Transportation Needs (05/24/2023)   PRAPARE - Administrator, Civil Service (Medical): No    Lack of Transportation (Non-Medical): No  Physical Activity: Sufficiently Active (05/24/2023)   Exercise Vital Sign    Days of Exercise per Week: 3 days    Minutes of Exercise per Session: 150+ min  Stress: No Stress Concern Present (05/24/2023)   [employer] of Occupational Health - Occupational Stress Questionnaire    Feeling of Stress : Not at all  Social Connections: Socially Integrated (05/24/2023)   Social Connection and Isolation Panel [NHANES]    Frequency of Communication with Friends and Family: More than three times a week    Frequency of Social Gatherings with Friends and Family: Three times a week    Attends Religious Services: More than 4 times per year    Active Member of Clubs or Organizations: Yes    Attends Engineer, structural: More than 4 times per year    Marital Status: Married        Objective:  Physical Exam: BP 127/78   Pulse 88   Temp 98.2 F (36.8 C) (Temporal)   Ht 5\' 8"  (1.727 m)   Wt 137 lb 6.4 oz (62.3 kg)   SpO2 96%   BMI 20.89 kg/m   Body mass index is 20.89 kg/m. Wt Readings from Last 3 Encounters:  05/31/23 137 lb 6.4 oz (62.3 kg)  05/17/23 137 lb (62.1 kg)  03/11/23 141 lb 12.8 oz (64.3 kg)   Gen: NAD, resting comfortably HEENT: TMs normal bilaterally. OP clear. No thyromegaly noted.  CV: RRR with no murmurs appreciated Pulm:  NWOB, CTAB with no crackles, wheezes, or rhonchi GI: Normal bowel sounds present. Soft, Nontender, Nondistended. MSK: no edema, cyanosis, or clubbing noted Skin: warm, dry Neuro: CN2-12 grossly intact. Strength 5/5 in upper and lower extremities. Reflexes symmetric and intact bilaterally.  Psych: Normal affect and thought content     David Mckellips M. Daneil Dunker, MD 05/31/2023 2:36 PM

## 2023-05-31 NOTE — Assessment & Plan Note (Signed)
 Has had extensive workup with GI since our last visit including MRI a few days ago.  His workup has essentially been negative.  It was thought that his elevated alk phos was related to use of Bactrim .  Will recheck c-Met today though defer further management to GI.

## 2023-05-31 NOTE — Assessment & Plan Note (Signed)
 Follows with endocrinology.  Will check TSH today.  He is on Synthroid  88 mcg daily.

## 2023-05-31 NOTE — Assessment & Plan Note (Signed)
 Check lipids.  Doing well with Lipitor 40 mg daily.

## 2023-06-01 ENCOUNTER — Encounter: Payer: Self-pay | Admitting: Internal Medicine

## 2023-06-01 LAB — LIPID PANEL
Cholesterol: 110 mg/dL (ref <200)
HDL: 70 mg/dL (ref >=40)
LDL Cholesterol (Calc): 25 mg/dL
Non-HDL Cholesterol (Calc): 40 mg/dL (ref <130)
Total CHOL/HDL Ratio: 1.6 (calc) (ref <5.0)
Triglycerides: 66 mg/dL (ref <150)

## 2023-06-01 LAB — MICROALBUMIN / CREATININE URINE RATIO
Creatinine, Urine: 46 mg/dL (ref 20–320)
Microalb Creat Ratio: 83 mg/g{creat} — ABNORMAL HIGH (ref <30)
Microalb, Ur: 3.8 mg/dL

## 2023-06-01 LAB — TSH: TSH: 5.09 m[IU]/L — ABNORMAL HIGH (ref 0.40–4.50)

## 2023-06-01 LAB — COMPREHENSIVE METABOLIC PANEL WITH GFR
AG Ratio: 2.3 (calc) (ref 1.0–2.5)
ALT: 26 U/L (ref 9–46)
AST: 32 U/L (ref 10–35)
Albumin: 4.3 g/dL (ref 3.6–5.1)
Alkaline phosphatase (APISO): 115 U/L (ref 35–144)
BUN/Creatinine Ratio: 29 (calc) — ABNORMAL HIGH (ref 6–22)
BUN: 28 mg/dL — ABNORMAL HIGH (ref 7–25)
CO2: 27 mmol/L (ref 20–32)
Calcium: 9.2 mg/dL (ref 8.6–10.3)
Chloride: 98 mmol/L (ref 98–110)
Creat: 0.96 mg/dL (ref 0.70–1.28)
Globulin: 1.9 g/dL (ref 1.9–3.7)
Glucose, Bld: 86 mg/dL (ref 65–99)
Potassium: 4.1 mmol/L (ref 3.5–5.3)
Sodium: 133 mmol/L — ABNORMAL LOW (ref 135–146)
Total Bilirubin: 0.5 mg/dL (ref 0.2–1.2)
Total Protein: 6.2 g/dL (ref 6.1–8.1)
eGFR: 84 mL/min/{1.73_m2} (ref >=60)

## 2023-06-01 LAB — HEMOGLOBIN A1C
Hgb A1c MFr Bld: 5.9 % — ABNORMAL HIGH (ref <5.7)
Mean Plasma Glucose: 123 mg/dL
eAG (mmol/L): 6.8 mmol/L

## 2023-06-01 LAB — CBC
HCT: 36.4 % — ABNORMAL LOW (ref 38.5–50.0)
Hemoglobin: 12.2 g/dL — ABNORMAL LOW (ref 13.2–17.1)
MCH: 29.2 pg (ref 27.0–33.0)
MCHC: 33.5 g/dL (ref 32.0–36.0)
MCV: 87.1 fL (ref 80.0–100.0)
MPV: 9.8 fL (ref 7.5–12.5)
Platelets: 201 10*3/uL (ref 140–400)
RBC: 4.18 10*6/uL — ABNORMAL LOW (ref 4.20–5.80)
RDW: 13.2 % (ref 11.0–15.0)
WBC: 9.1 10*3/uL (ref 3.8–10.8)

## 2023-06-01 LAB — PSA: PSA: 4.24 ng/mL — ABNORMAL HIGH (ref <=4.00)

## 2023-06-03 ENCOUNTER — Encounter: Payer: Self-pay | Admitting: Gastroenterology

## 2023-06-03 NOTE — Progress Notes (Signed)
 David Maynard, There was no evidence of biliary dilation or bile duct disease such as primary sclerosing cholangitis on your MRI.  I feel very confident that your elevated liver enzymes were secondary to a drug-induced liver injury that has been very slow to completely resolve.    Multiple small cysts were found in the liver.  These were incidental, very common, and do not require further follow-up. Your colon has an unusual anatomy in that part of it goes in front of the liver.  I suspect that this may account for the difficulties in completing a colonoscopy for you previously.  It is unlikely that this is causing symptoms.  Surgery could be considered, if we were confident that it was causing problems, but I do not think surgery would be recommended for you based on your reported symptoms.  If you would like to meet with a surgeon, we could refer you.

## 2023-06-04 ENCOUNTER — Other Ambulatory Visit: Payer: Self-pay | Admitting: *Deleted

## 2023-06-04 ENCOUNTER — Ambulatory Visit: Payer: Self-pay | Admitting: Family Medicine

## 2023-06-04 DIAGNOSIS — R7989 Other specified abnormal findings of blood chemistry: Secondary | ICD-10-CM

## 2023-06-04 NOTE — Progress Notes (Signed)
 His blood counts are down a little bit but stable compared to his baseline.  Can we add on a B12 and iron panel?  If not we should have him come back to recheck.  It also looks like he is a little dehydrated.  Recommend that he continue to push fluids and stay well-hydrated.  I would like to recheck his c-Met in a few weeks when he comes back in for labs.  His A1c was 5.9.  He can discuss this further with his endocrinologist.  His TSH was also stable at 5.09.  He has a moderately increased amount of protein in his urine.  This is due to his diabetes however the amount is improving compared to previous.  He may benefit from starting a medication to help protect his kidneys. We can discuss at his next visit.   His PSA is 4.24.  This is better than it has been for the last couple of years.  He can discuss further with urology.  The rest of his labs are all at goal.

## 2023-06-05 NOTE — Telephone Encounter (Signed)
 Please schedule a lab visit next week  Lab order placed

## 2023-06-10 ENCOUNTER — Other Ambulatory Visit (INDEPENDENT_AMBULATORY_CARE_PROVIDER_SITE_OTHER)

## 2023-06-10 DIAGNOSIS — R7989 Other specified abnormal findings of blood chemistry: Secondary | ICD-10-CM

## 2023-06-10 DIAGNOSIS — E1169 Type 2 diabetes mellitus with other specified complication: Secondary | ICD-10-CM | POA: Diagnosis not present

## 2023-06-10 DIAGNOSIS — E039 Hypothyroidism, unspecified: Secondary | ICD-10-CM

## 2023-06-10 DIAGNOSIS — E119 Type 2 diabetes mellitus without complications: Secondary | ICD-10-CM

## 2023-06-10 DIAGNOSIS — E785 Hyperlipidemia, unspecified: Secondary | ICD-10-CM | POA: Diagnosis not present

## 2023-06-10 DIAGNOSIS — Z1211 Encounter for screening for malignant neoplasm of colon: Secondary | ICD-10-CM

## 2023-06-10 DIAGNOSIS — Z0000 Encounter for general adult medical examination without abnormal findings: Secondary | ICD-10-CM | POA: Diagnosis not present

## 2023-06-10 LAB — LIPID PANEL
Cholesterol: 105 mg/dL (ref 0–200)
HDL: 65.2 mg/dL (ref >39.00)
LDL Cholesterol: 30 mg/dL (ref 0–99)
NonHDL: 39.78
Total CHOL/HDL Ratio: 2
Triglycerides: 47 mg/dL (ref 0.0–149.0)
VLDL: 9.4 mg/dL (ref 0.0–40.0)

## 2023-06-10 LAB — CBC
HCT: 34.9 % — ABNORMAL LOW (ref 39.0–52.0)
Hemoglobin: 11.9 g/dL — ABNORMAL LOW (ref 13.0–17.0)
MCHC: 34.1 g/dL (ref 30.0–36.0)
MCV: 86.1 fl (ref 78.0–100.0)
Platelets: 214 10*3/uL (ref 150.0–400.0)
RBC: 4.05 Mil/uL — ABNORMAL LOW (ref 4.22–5.81)
RDW: 14.6 % (ref 11.5–15.5)
WBC: 6.4 10*3/uL (ref 4.0–10.5)

## 2023-06-10 LAB — COMPREHENSIVE METABOLIC PANEL WITH GFR
ALT: 24 U/L (ref 0–53)
AST: 31 U/L (ref 0–37)
Albumin: 4.3 g/dL (ref 3.5–5.2)
Alkaline Phosphatase: 121 U/L — ABNORMAL HIGH (ref 39–117)
BUN: 27 mg/dL — ABNORMAL HIGH (ref 6–23)
CO2: 26 meq/L (ref 19–32)
Calcium: 9.1 mg/dL (ref 8.4–10.5)
Chloride: 99 meq/L (ref 96–112)
Creatinine, Ser: 0.74 mg/dL (ref 0.40–1.50)
GFR: 90.26 mL/min (ref >60.00)
Glucose, Bld: 99 mg/dL (ref 70–99)
Potassium: 4.1 meq/L (ref 3.5–5.1)
Sodium: 133 meq/L — ABNORMAL LOW (ref 135–145)
Total Bilirubin: 0.5 mg/dL (ref 0.2–1.2)
Total Protein: 6.2 g/dL (ref 6.0–8.3)

## 2023-06-10 LAB — HM DIABETES EYE EXAM

## 2023-06-10 LAB — HEMOGLOBIN A1C: Hgb A1c MFr Bld: 6 % (ref 4.6–6.5)

## 2023-06-11 LAB — PSA: PSA: 7.14 ng/mL — ABNORMAL HIGH (ref 0.10–4.00)

## 2023-06-11 LAB — IRON,TIBC AND FERRITIN PANEL
%SAT: 12 % — ABNORMAL LOW (ref 20–48)
Ferritin: 18 ng/mL — ABNORMAL LOW (ref 24–380)
Iron: 40 ug/dL — ABNORMAL LOW (ref 50–180)
TIBC: 344 ug/dL (ref 250–425)

## 2023-06-11 LAB — VITAMIN B12: Vitamin B-12: 294 pg/mL (ref 211–911)

## 2023-06-12 ENCOUNTER — Ambulatory Visit: Payer: Self-pay | Admitting: Family Medicine

## 2023-06-12 ENCOUNTER — Encounter: Payer: Self-pay | Admitting: Gastroenterology

## 2023-06-12 NOTE — Progress Notes (Signed)
 His blood counts are still trending down a bit.  It looks like this is due to iron deficiency.  He needs to complete his workup with GI to make sure that he is not having any internal bleeding.  Recommend he schedule appointment to discuss this with him soon.  Recommend starting an iron supplement if he is not doing so already.  He can take p.o. sulfate 65 mg every other day on an empty stomach with vitamin C.  We should recheck this in a few months.  His PSA elevated quite a bit since 2 weeks ago.  Recommend he schedule follow-up with urology to discuss this soon.  His BUN is elevated.  I am worried that this may be due to internal bleeding as above.  We can recheck again in a few months but recommend he follow-up with GI ASAP.

## 2023-06-13 ENCOUNTER — Ambulatory Visit (INDEPENDENT_AMBULATORY_CARE_PROVIDER_SITE_OTHER): Admitting: Orthopedic Surgery

## 2023-06-13 ENCOUNTER — Encounter: Payer: Self-pay | Admitting: Orthopedic Surgery

## 2023-06-13 DIAGNOSIS — Z9889 Other specified postprocedural states: Secondary | ICD-10-CM

## 2023-06-13 NOTE — Progress Notes (Signed)
   NISHAN OVENS - 73 y.o. male MRN [id number]  Date of birth: 06-02-1950  Office Visit Note: Visit Date: 06/13/2023 PCP: Rodney Clamp, MD Referred by: Rodney Clamp, MD  Subjective:  HPI: RUXIN RANSOME is a 73 y.o. male who presents today for follow up 2 weeks status post right wrist open carpal tunnel release.  He is doing well overall, numbness and tingling has persisted.  Pain is controlled.  Pertinent ROS were reviewed with the patient and found to be negative unless otherwise specified above in HPI.   Assessment & Plan: Visit Diagnoses:  1. S/P carpal tunnel release     Plan: He is doing well overall, wound is well-healing.  Sutures removed today, Steri-Strips applied.  He was given home exercise packet for range of motion exercise with progression of strengthening.  He will contact our office if he feels the need for occupational therapy moving forward.  His range of motion is very good at this point.  I will plan on seeing her back approxi-4 weeks to track his progress.  Follow-up: No follow-ups on file.   Meds & Orders: No orders of the defined types were placed in this encounter.  No orders of the defined types were placed in this encounter.    Procedures: No procedures performed       Objective:   Vital Signs: There were no vitals taken for this visit.  Ortho Exam Right hand: - Well-healing palmar incision, sutures removed today, no erythema or drainage - Composite fist without restriction - Sensation remains diminished to the distal aspects of the index and long finger to light touch - 5/5 APB without thenar atrophy   Imaging: No results found.   Sylvanna Burggraf Alvia Jointer, M.D. [facility], Hand Surgery

## 2023-06-13 NOTE — Telephone Encounter (Signed)
 Records faxed to [phone] [facility]

## 2023-06-20 ENCOUNTER — Ambulatory Visit

## 2023-06-21 ENCOUNTER — Other Ambulatory Visit: Payer: Self-pay | Admitting: Internal Medicine

## 2023-06-21 DIAGNOSIS — E119 Type 2 diabetes mellitus without complications: Secondary | ICD-10-CM

## 2023-06-22 ENCOUNTER — Encounter: Payer: Self-pay | Admitting: Internal Medicine

## 2023-06-24 ENCOUNTER — Encounter: Payer: Self-pay | Admitting: Family Medicine

## 2023-06-24 MED ORDER — BD INSULIN SYRINGE U-500 31G X 6MM 0.5 ML MISC: 1 refills | Status: AC

## 2023-06-24 MED ORDER — AZITHROMYCIN 500 MG PO TABS: 500.0000 mg | ORAL_TABLET | Freq: Every day | ORAL | 0 refills | Status: DC

## 2023-06-24 NOTE — Telephone Encounter (Signed)
**Note De-identified  [place] Obfuscation** Please advise 

## 2023-06-24 NOTE — Telephone Encounter (Signed)
 Prescription for azithromycin  sent in.  David Maynard. Daneil Dunker, MD 06/24/2023 12:24 PM

## 2023-07-15 ENCOUNTER — Ambulatory Visit (INDEPENDENT_AMBULATORY_CARE_PROVIDER_SITE_OTHER): Admitting: Orthopedic Surgery

## 2023-07-15 DIAGNOSIS — Z9889 Other specified postprocedural states: Secondary | ICD-10-CM

## 2023-07-15 NOTE — Progress Notes (Signed)
   David Maynard - 73 y.o. male MRN [id number]  Date of birth: 1950-04-29  Office Visit Note: Visit Date: 07/15/2023 PCP: Kennyth Worth HERO, MD Referred by: Kennyth Worth HERO, MD  Subjective:  HPI: David Maynard is a 73 y.o. male who presents today for follow up 5 weeks status post right wrist open carpal tunnel release.  He is doing well overall, numbness and tingling improving slowly, does have some persistent numbness in the distal aspect of the long finger.  Has resumed playing golf and tennis 1 time each, has been doing reasonably well.  Pertinent ROS were reviewed with the patient and found to be negative unless otherwise specified above in HPI.   Assessment & Plan: Visit Diagnoses:  1. S/P carpal tunnel release      Plan: He is doing well overall and demonstrates appropriate progression.  Continue with activities as tolerated.  I will plan on seeing him back approximately-6 weeks to track his progress.  Follow-up: No follow-ups on file.   Meds & Orders: No orders of the defined types were placed in this encounter.  No orders of the defined types were placed in this encounter.    Procedures: No procedures performed       Objective:   Vital Signs: There were no vitals taken for this visit.  Ortho Exam Right hand: - Well-healed palmar incision, no erythema or drainage - Composite fist without restriction - Sensation remains diminished to the distal aspects of the index and long finger to light touch - 5/5 APB without thenar atrophy - Grip strength Jamar 2 right 80, left 70   Imaging: No results found.   Shyann Hefner Afton Alderton, M.D. [facility], Hand Surgery

## 2023-07-17 ENCOUNTER — Ambulatory Visit
Admission: RE | Admit: 2023-07-17 | Discharge: 2023-07-17 | Disposition: A | Discharge status: Home / Self Care | Source: Ambulatory Visit | Attending: Family Medicine | Admitting: Family Medicine

## 2023-07-17 ENCOUNTER — Ambulatory Visit (INDEPENDENT_AMBULATORY_CARE_PROVIDER_SITE_OTHER)

## 2023-07-17 VITALS — BP 136/62 | HR 108 | Temp 101.0°F | Resp 16 | Ht 66.5 in | Wt 133.0 lb

## 2023-07-17 DIAGNOSIS — R0789 Other chest pain: Secondary | ICD-10-CM

## 2023-07-17 DIAGNOSIS — S20211A Contusion of right front wall of thorax, initial encounter: Secondary | ICD-10-CM

## 2023-07-17 DIAGNOSIS — R509 Fever, unspecified: Secondary | ICD-10-CM

## 2023-07-17 DIAGNOSIS — R079 Chest pain, unspecified: Secondary | ICD-10-CM | POA: Diagnosis not present

## 2023-07-17 LAB — POCT URINALYSIS DIP (MANUAL ENTRY)
Bilirubin, UA: NEGATIVE
Glucose, UA: NEGATIVE mg/dL
Ketones, POC UA: NEGATIVE mg/dL
Nitrite, UA: NEGATIVE
Protein Ur, POC: 100 mg/dL — AB
Spec Grav, UA: 1.02 (ref 1.010–1.025)
Urobilinogen, UA: 0.2 U/dL
pH, UA: 5.5 (ref 5.0–8.0)

## 2023-07-17 MED ORDER — CYCLOBENZAPRINE HCL 5 MG PO TABS
5.0000 mg | ORAL_TABLET | Freq: Every evening | ORAL | 0 refills | Status: DC | PRN
Start: 2023-07-17 — End: 2023-09-02

## 2023-07-17 NOTE — ED Triage Notes (Signed)
 Pt states that he fell on his right side. X2 days

## 2023-07-17 NOTE — ED Provider Notes (Signed)
 [facility]  Note:  This document was prepared using Conservation officer, historic buildings and may include unintentional dictation errors.  MRN: [id number] DOB: [birth year]  Subjective:   David Maynard is a 73 y.o. male presenting for 2-day history of persistent pain over the right flank side, right ribs.  Patient excellently fell hitting the garage floor on the side of his torso.  Since then he has had progressive pain, his breathing as a result and movement.  No fever, sinus symptoms, throat pain, cough, shortness of breath, wheezing, nausea, vomiting, abdominal pain, urinary symptoms, changes to bowel habits.  No rashes.  No history of respiratory disorders.  Patient does not want any kind of medication for his fever.  No smoking.  No drug use.  No current facility-administered medications for this encounter.  Current Outpatient Medications:    Accu-Chek Softclix Lancets lancets, Use to check blood sugar 6 times a day, Disp: 600 each, Rfl: 2   ascorbic acid  (VITAMIN C) 500 MG tablet, Take 1 tablet (500 mg total) by mouth daily., Disp: 20 tablet, Rfl: 0   aspirin  EC 81 MG tablet, Take 81 mg by mouth daily. Swallow whole., Disp: , Rfl:    atorvastatin  (LIPITOR) 40 MG tablet, TAKE 1 TABLET BY MOUTH DAILY, Disp: 90 tablet, Rfl: 3   Blood Glucose Monitoring Suppl (ACCU-CHEK GUIDE ME) w/Device KIT, Use to check blood sugar 6 times a day, Disp: 1 kit, Rfl: 0   Calcium  Carbonate (CALCIUM  600 PO), Take 1 tablet by mouth daily., Disp: , Rfl:    Cinnamon 500 MG capsule, Take 3,000 mg by mouth 2 (two) times daily., Disp: , Rfl:    colesevelam  (WELCHOL ) 625 MG tablet, TAKE 2 TABLETS BY MOUTH TWICE  DAILY WITH A MEAL, Disp: 360 tablet, Rfl: 3   Continuous Glucose Sensor (DEXCOM G7 SENSOR) MISC, Use to check glucose continuously, change sensor every 10 days, Disp: 9 each, Rfl: 3   docusate sodium  (COLACE) 100 MG capsule, Take 100 mg by mouth 2 (two) times daily. Takes daily, Disp: ,  Rfl:    Glucagon  3 MG/DOSE POWD, Place 3 mg into the nose once as needed for up to 1 dose., Disp: 1 each, Rfl: 11   Glucosamine-Chondroit-Vit C-Mn (GLUCOSAMINE CHONDR 1500 COMPLX PO), Take 2 tablets by mouth daily., Disp: , Rfl:    glucose blood (ACCU-CHEK GUIDE TEST) test strip, CHECK BLOOD SUGAR 6 TIMES DAILY, Disp: 600 strip, Rfl: 3   Insulin  Disposable Pump (OMNIPOD 5 G7 PODS, GEN 5,) MISC, 1 each by Does not apply route every 3 (three) days., Disp: 30 each, Rfl: 3   insulin  lispro (HUMALOG ) 100 UNIT/ML cartridge, Inject 0.5 mLs (50 Units total) into the skin daily. Via pump, Disp: 45 mL, Rfl: 3   insulin  lispro (HUMALOG ) 100 UNIT/ML injection, INJECT SUBCUTANEOUSLY 50 UNITS  DAILY VIA INSULIN  PUMP (Patient taking differently: INJECT SUBCUTANEOUSLY 50 UNITS  DAILY VIA INSULIN  PUMP), Disp: 50 mL, Rfl: 3   Insulin  Lispro-aabc 100 UNIT/ML SOLN, Inject 50 Units as directed daily. Via pump, Disp: 50 mL, Rfl: 1   Insulin  Pen Needle 31G X 5 MM MISC, 1 Device by Does not apply route in the morning and at bedtime., Disp: 200 each, Rfl: 3   Insulin  Syringe/Needle U-500 (BD INSULIN  SYRINGE U-500) 31G X 0.5 ML MISC, Use to inject insulin  3 times a day if pump malfunctions., Disp: 300 each, Rfl: 1   levothyroxine  (SYNTHROID ) 88 MCG tablet, TAKE 1 TABLET BY  MOUTH DAILY  BEFORE BREAKFAST, Disp: 90 tablet, Rfl: 1   meloxicam  (MOBIC ) 15 MG tablet, TAKE 1 TABLET(15 MG) BY MOUTH DAILY, Disp: 30 tablet, Rfl: 0   metFORMIN  (GLUCOPHAGE -XR) 500 MG 24 hr tablet, TAKE 2 TABLETS BY MOUTH TWICE  DAILY, Disp: 360 tablet, Rfl: 3   Multiple Vitamin (MULTIVITAMIN) tablet, Take 1 tablet by mouth daily., Disp: , Rfl:    MYRBETRIQ  50 MG TB24 tablet, Take 50 mg by mouth daily., Disp: , Rfl:    omega-3 acid ethyl esters (LOVAZA ) 1 g capsule, TAKE 2 CAPSULES BY MOUTH TWICE  DAILY, Disp: 360 capsule, Rfl: 3   Semaglutide ,0.25 or 0.5MG /DOS, (OZEMPIC , 0.25 OR 0.5 MG/DOSE,) 2 MG/3ML SOPN, INJECT SUBCUTANEOUSLY 0.5 MG  EVERY WEEK,  Disp: 9 mL, Rfl: 3   tadalafil  (ADCIRCA /CIALIS ) 20 MG tablet, Take 20 mg by mouth daily as needed for erectile dysfunction., Disp: , Rfl:    Trospium Chloride 60 MG CP24, Take 60 mg by mouth daily., Disp: , Rfl:    Turmeric 500 MG CAPS, Take 500-1,000 mg by mouth daily. May take an extra dose if plays golf or tennis, Disp: , Rfl:    VITAMIN D , CHOLECALCIFEROL , PO, Take 1 tablet by mouth daily., Disp: , Rfl:    vitamin E 200 UNIT capsule, Take 200 Units by mouth daily., Disp: , Rfl:    azithromycin  (ZITHROMAX ) 500 MG tablet, Take 1 tablet (500 mg total) by mouth daily. 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   and left   Thyroid  disease    Unspecified hypothyroidism      Past Surgical History:  Procedure Laterality Date   COLONOSCOPY     COLONOSCOPY WITH PROPOFOL   02/28/2021   ESOPHAGOGASTRODUODENOSCOPY  06/16/2003   EYE SURGERY     INGUINAL HERNIA REPAIR     left   JOINT REPLACEMENT     KNEE ARTHROSCOPY     Left   KNEE ARTHROSCOPY W/ ACL RECONSTRUCTION     left   PENILE PROSTHESIS IMPLANT N/A 02/27/2022   Procedure: INSERTION OF INFLATABLE PENILE IMPLANT;  Surgeon: Lovie Arlyss CROME, MD;  Location: [facility];  Service: Urology;  Laterality: N/A;  90 MINUTES NEEDED FOR CASE   ROTATOR CUFF REPAIR     Right x2   Stress Cardiolite  07/15/2000    TOTAL KNEE ARTHROPLASTY Right 11/04/2017   TOTAL KNEE ARTHROPLASTY Right 11/04/2017   Procedure: TOTAL KNEE ARTHROPLASTY;  Surgeon: Jane Charleston, MD;  Location: [facility] OR;  Service: Orthopedics;  Laterality: Right;   TOTAL KNEE ARTHROPLASTY Left 12/23/2017   TOTAL KNEE ARTHROPLASTY Left 12/23/2017   Procedure: TOTAL KNEE ARTHROPLASTY;  Surgeon: Jane Charleston, MD;  Location: [facility] OR;  Service: Orthopedics;  Laterality: Left;   VASECTOMY  05/2002    Family  History  Problem Relation Age of Onset   Hypertension Mother    Kidney disease Father    Breast cancer Neg Hx    Celiac disease Neg Hx    Cirrhosis Neg Hx    Clotting disorder Neg Hx    Colitis Neg Hx    Colon cancer Neg Hx    Colon polyps Neg Hx    Crohn's disease Neg Hx    Cystic fibrosis Neg Hx    Diabetes Neg Hx    Esophageal cancer Neg Hx    Heart disease Neg Hx    Hemochromatosis Neg Hx    Inflammatory bowel disease Neg Hx    Irritable bowel syndrome Neg Hx    Liver cancer Neg Hx    Liver disease Neg Hx    Ovarian cancer Neg Hx    Pancreatic cancer Neg Hx    Prostate cancer Neg Hx    Rectal cancer Neg Hx    Stomach cancer Neg Hx    Ulcerative colitis Neg Hx    Uterine cancer Neg Hx    Wilson's disease Neg Hx     Social History   Tobacco Use   Smoking status: Never   Smokeless tobacco: Never  Vaping Use   Vaping status: Never Used  Substance Use Topics   Alcohol use: Not Currently    Alcohol/week: 1.0 standard drink of alcohol    Types: 1 Cans of beer per week    Comment: weekly   Drug use: No    ROS   Objective:   Vitals: BP 136/62 (BP Location: Left Arm)   Pulse (!) 108   Temp (!) 101 F (38.3 C) (Oral)   Resp 16   Ht 5' 6.5 (1.689 m)   Wt 133 lb (60.3 kg)   SpO2 92%   BMI 21.15 kg/m   Physical Exam Constitutional:      General: He is not in acute distress.    Appearance: Normal appearance. He is well-developed and normal weight. He is not ill-appearing, toxic-appearing or  diaphoretic.  HENT:     Head: Normocephalic and atraumatic.     Right Ear: External ear normal.     Left Ear: External ear normal.     Nose: Nose normal.     Mouth/Throat:     Mouth: Mucous membranes are moist.   Eyes:     General: No scleral icterus.       Right eye: No discharge.        Left eye: No discharge.     Extraocular Movements: Extraocular movements intact.    Cardiovascular:     Rate and Rhythm: Normal rate and regular rhythm.     Heart sounds: Normal heart sounds. No murmur heard.    No friction rub. No gallop.  Pulmonary:     Effort: Pulmonary effort is normal. No respiratory distress.     Breath sounds: Normal breath sounds. No stridor. No wheezing, rhonchi or rales.  Chest:     Chest wall: Tenderness (lateral chest wall on the right side along the lower right ribs) present.  Abdominal:     General: Bowel sounds are normal. There is no distension.     Palpations: Abdomen is soft. There is no mass.     Tenderness: There is no abdominal tenderness. There is no right CVA tenderness, left CVA tenderness, guarding or rebound.   Musculoskeletal:     Cervical back: Normal range of motion.   Neurological:     Mental  Status: He is alert and oriented to person, place, and time.   Psychiatric:        Mood and Affect: Mood normal.        Behavior: Behavior normal.        Thought Content: Thought content normal.        Judgment: Judgment normal.     DG Ribs Unilateral W/Chest Right Result Date: 07/17/2023 CLINICAL DATA:  Right-sided chest pain after fall. EXAM: RIGHT RIBS AND CHEST - 3+ VIEW COMPARISON:  December 01, 2019. FINDINGS: No fracture or other bone lesions are seen involving the ribs. There is no evidence of pneumothorax or pleural effusion. Minimal right basilar subsegmental atelectasis is noted. Heart size and mediastinal contours are within normal limits. IMPRESSION: No definite evidence of rib fracture. Minimal right basilar subsegmental atelectasis.  Electronically Signed   By: Lynwood Landy Raddle M.D.   On: 07/17/2023 18:40   Results for orders placed or performed during the hospital encounter of 07/17/23 (from the past 24 hours)  POCT urinalysis dipstick     Status: Abnormal   Collection Time: 07/17/23  6:56 PM  Result Value Ref Range   Color, UA yellow yellow   Clarity, UA hazy (A) clear   Glucose, UA negative negative mg/dL   Bilirubin, UA negative negative   Ketones, POC UA negative negative mg/dL   Spec Grav, UA 8.979 8.989 - 1.025   Blood, UA trace-intact (A) negative   pH, UA 5.5 5.0 - 8.0   Protein Ur, POC =100 (A) negative mg/dL   Urobilinogen, UA 0.2 0.2 or 1.0 E.U./dL   Nitrite, UA Negative Negative   Leukocytes, UA Moderate (2+) (A) Negative    Assessment and Plan :   PDMP not reviewed this encounter.  1. Chest wall contusion, right, initial encounter   2. Right-sided chest pain   3. Chest wall pain   4. Fever, unspecified    Patient declined any other kind of testing apart from what was done in clinic.  He declined intervention for his fever in clinic as well.  I did advise patient that he is likely undergoing an undifferentiated infection coincidentally given his fever.  Chest x-ray was negative and therefore will manage for chest wall contusion with a muscle relaxant and advised that patient take pain medicine at home that he is comfortable with.  Counseled patient on potential for adverse effects with medications prescribed/recommended today, ER and return-to-clinic precautions discussed, patient verbalized understanding.    Christopher Savannah, [place] 07/18/23 [phone]

## 2023-07-17 NOTE — Discharge Instructions (Addendum)
 Please do your best to take deep breaths, normal breaths despite your pain from your chest wall bruise. Do not use any nonsteroidal anti-inflammatories (NSAIDs) like ibuprofen, Motrin, naproxen, Aleve, etc. which are all available over-the-counter.  Please just use Tylenol  at a dose of 500mg -650mg  once every 6 hours as needed for your aches, pains, fevers. Use the cyclobenzaprine muscle relaxant to help you breathe.

## 2023-07-18 ENCOUNTER — Telehealth: Payer: Self-pay | Admitting: Urgent Care

## 2023-07-18 MED ORDER — CEPHALEXIN 500 MG PO CAPS: 500.0000 mg | ORAL_CAPSULE | Freq: Two times a day (BID) | ORAL | 0 refills | Status: DC

## 2023-07-18 NOTE — Telephone Encounter (Signed)
 Patient had concerns about urinary tract infection.  Deferred urine culture, will send prescription for cephalexin  to his pharmacy for empiric treatment of asymptomatic bacteriuria.  This is reasonable given his undifferentiated fever from yesterday's visit.  Prescription sent to him to the confirmed pharmacy on file.

## 2023-07-22 ENCOUNTER — Ambulatory Visit

## 2023-08-08 ENCOUNTER — Other Ambulatory Visit: Payer: Self-pay | Admitting: Family Medicine

## 2023-08-08 ENCOUNTER — Other Ambulatory Visit: Payer: Self-pay | Admitting: Internal Medicine

## 2023-08-08 DIAGNOSIS — E039 Hypothyroidism, unspecified: Secondary | ICD-10-CM

## 2023-08-26 ENCOUNTER — Ambulatory Visit (INDEPENDENT_AMBULATORY_CARE_PROVIDER_SITE_OTHER): Admitting: Orthopedic Surgery

## 2023-08-26 DIAGNOSIS — Z9889 Other specified postprocedural states: Secondary | ICD-10-CM

## 2023-08-26 NOTE — Progress Notes (Signed)
   David Maynard - 73 y.o. male MRN [id number]  Date of birth: 01-07-1951  Office Visit Note: Visit Date: 08/26/2023 PCP: Kennyth Worth HERO, MD Referred by: Kennyth Worth HERO, MD  Subjective:  HPI: David Maynard is a 73 y.o. male who presents today for follow up 11 weeks status post right wrist open carpal tunnel release.  He is doing very well overall, numbness and tingling has resolved throughout the hand except for the very distal aspect of the long finger.  Has resumed activities as tolerated without any restrictions.  Pertinent ROS were reviewed with the patient and found to be negative unless otherwise specified above in HPI.   Assessment & Plan: Visit Diagnoses: No diagnosis found.  Plan: He is doing very well status post right open carpal tunnel release.  At this juncture, he is making excellent progress from a activity and range of motion standpoint.  I am pleased to see that his numbness and tingling has improved drastically.  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  He will return in the future for repeat discussion should this become more symptomatic.  He can follow-up in approximate 3 months status post carpal tunnel release.  Follow-up: No follow-ups on file.   Meds & Orders: No orders of the defined types were placed in this encounter.  No orders of the defined types were placed in this encounter.    Procedures: No procedures performed       Objective:   Vital Signs: There were no vitals taken for this visit.  Ortho Exam Right hand: - Well-healing palmar incision, sutures removed today, no erythema or drainage - Composite fist without restriction - Sensation remains diminished to the distal aspects of the index and long finger to light touch - 2 point discrimination is 5 mm in the median nerve distribution, thumb, index and long finger - 5/5 APB without thenar atrophy  Imaging: No results found.   Ebony Yorio Afton Alderton, M.D. [facility], Hand Surgery

## 2023-08-27 ENCOUNTER — Ambulatory Visit
Admission: RE | Admit: 2023-08-27 | Discharge: 2023-08-27 | Disposition: A | Discharge status: Home / Self Care | Source: Ambulatory Visit | Attending: Gastroenterology | Admitting: Gastroenterology

## 2023-09-02 ENCOUNTER — Encounter: Payer: Self-pay | Admitting: Internal Medicine

## 2023-09-02 ENCOUNTER — Ambulatory Visit (INDEPENDENT_AMBULATORY_CARE_PROVIDER_SITE_OTHER): Payer: Medicare Other | Admitting: Internal Medicine

## 2023-09-02 VITALS — BP 120/70 | HR 78 | Ht 66.5 in | Wt 140.6 lb

## 2023-09-02 DIAGNOSIS — Z7985 Long-term (current) use of injectable non-insulin antidiabetic drugs: Secondary | ICD-10-CM

## 2023-09-02 DIAGNOSIS — E785 Hyperlipidemia, unspecified: Secondary | ICD-10-CM | POA: Diagnosis not present

## 2023-09-02 DIAGNOSIS — Z7984 Long term (current) use of oral hypoglycemic drugs: Secondary | ICD-10-CM

## 2023-09-02 DIAGNOSIS — Z794 Long term (current) use of insulin: Secondary | ICD-10-CM

## 2023-09-02 DIAGNOSIS — E119 Type 2 diabetes mellitus without complications: Secondary | ICD-10-CM

## 2023-09-02 DIAGNOSIS — E1169 Type 2 diabetes mellitus with other specified complication: Secondary | ICD-10-CM

## 2023-09-02 DIAGNOSIS — E039 Hypothyroidism, unspecified: Secondary | ICD-10-CM

## 2023-09-02 LAB — TSH: TSH: 10.07 m[IU]/L — ABNORMAL HIGH (ref 0.40–4.50)

## 2023-09-02 LAB — POCT GLYCOSYLATED HEMOGLOBIN (HGB A1C): Hemoglobin A1C: 5.9 % — AB (ref 4.0–5.6)

## 2023-09-02 LAB — T4, FREE: Free T4: 1.2 ng/dL (ref 0.8–1.8)

## 2023-09-02 NOTE — Progress Notes (Addendum)
 Patient ID: David Maynard, male   DOB: [birth year], 73 y.o.   MRN: [id number]  HPI: David Maynard is a 73 y.o.-year-old male, returning for follow-up for DM2, diagnosed in 2008, on insulin  since 2021, controlled, with complications (CAD).  He previously saw Dr. Kassie, but last visit with me 6 months ago.  Interim history: No increased urination, blurry vision, nausea, chest pain. He was in [place] for 2 weeks since last visit.  He lost his pump controller there, but he was able to find it.  Reviewed HbA1c levels: Lab Results  Component Value Date   HGBA1C 6.0 06/10/2023   HGBA1C 5.9 (H) 05/31/2023   HGBA1C 5.7 (A) 03/11/2023   Insulin  pump:  -Omnipod 5  CGM: -Dexcom G6 >> G7   Insulin : -Humalog  (did not work well for him) >> Lispro generic  Supplies: -Dexcom from [employer] -Omnipod from [employer] >> now [employer] b/c better price  Pump settings: - basal rates: 12 am: 0.25 units/h 5 am: 0.45 7 am: 0.3 - ICR: 1:7 - target: 110, except for 11 PM to 7 AM: 120 - ISF: 1:40 - Active insulin  time: 4h - extended bolusing: not using - changes infusion site: q3 days TDD from basal insulin :  39% >> 28% >> 19% >> 25% TDD from bolus insulin : 61% >> 72% >> 81% >> 75% Total daily dose: 31-50 >> 42-60 units /day  He is also on: - Victoza  1.8 mg in a.m. >> Ozempic  0.5 mg weekly - Metformin  ER 1000 mg 2x a day >> 2000 mg with dinner She was on Actos  but stopped 08/2021.  Meter: Accu-Chek guide  Pt checks his sugars more than 4 times a day:  Prev.:  Previously:  Lowest sugar was 50s >> 52 (CGM) >> 40s; he has hypoglycemia awareness at 60.  He does not have a glucagon  kit at home.  His wife is an Charity fundraiser and NP. Highest sugar was 254 >> 200s >> 200s.  - no CKD, last BUN/creatinine:  Lab Results  Component Value Date   BUN 27 (H) 06/10/2023   BUN 28 (H) 05/31/2023   CREATININE 0.74 06/10/2023   CREATININE 0.96 05/31/2023   Lab Results  Component Value Date   MICRALBCREAT 83 (H)  05/31/2023   MICRALBCREAT 137 (H) 03/11/2023   MICRALBCREAT 9.4 03/29/2009   MICRALBCREAT 9.1 04/10/2006   - + HL; last set of lipids: Lab Results  Component Value Date   CHOL 105 06/10/2023   HDL 65.20 06/10/2023   LDLCALC 30 06/10/2023   TRIG 47.0 06/10/2023   CHOLHDL 2 06/10/2023  On Lipitor 40 mg daily, Welchol  650 x 2 tablets twice a day, omega-3 fatty acids 2000 mg twice a day.  - last eye exam was 06/10/2023. No DR. Cataract sx 2021.  His vision improved significantly afterwards.  - no numbness and tingling in his feet.  Last foot exam 03/11/2023.  On ASA 81.  Reviewed his TFTs: Lab Results  Component Value Date   TSH 5.09 (H) 05/31/2023   TSH 6.00 (H) 10/29/2022   TSH 9.74 (H) 10/03/2022   TSH 6.83 (H) 03/19/2022   TSH 5.04 03/13/2021   TSH 2.74 02/23/2020   TSH 2.94 03/23/2019   TSH 6.67 (H) 01/13/2019   TSH 2.10 06/10/2018   TSH 2.72 12/03/2016   TSH 1.56 11/28/2015   TSH 1.82 12/03/2014   TSH 1.77 09/22/2014   TSH 1.99 11/09/2013   TSH 2.22 10/22/2012   TSH 2.68 06/19/2011   TSH 2.81 02/08/2010  TSH 2.35 03/29/2009   TSH 4.12 03/15/2008   TSH 3.36 06/18/2007   He is on Levothyroxine  88 mcg daily, increased 09/2022. - in am (5 am) - fasting - + b'fast >> now later - + calcium  twice a day >> at night - + iron at night - + multivitamins in a.m. (~1h later) >> at night - + PPIs in a.m. >> later in the say, inconsistently - not on Biotin   He has family history of type 1 diabetes - son.  He also has a history of ADD, B12 deficiency, BPH, GERD, hypogonadism, osteoarthritis, depression, anemia.. He had a penile implant 02/2022 and he was started on Bactrim  afterwards.  He developed kidney dysfunction and elevated LFTs: alk phos in the 700s and also increased AST and ALT.these improved.  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   and left   Thyroid  disease    Unspecified hypothyroidism    Past Surgical History:  Procedure Laterality Date   COLONOSCOPY     COLONOSCOPY WITH PROPOFOL   02/28/2021   ESOPHAGOGASTRODUODENOSCOPY  06/16/2003   EYE SURGERY     INGUINAL HERNIA REPAIR     left   JOINT REPLACEMENT     KNEE ARTHROSCOPY     Left   KNEE ARTHROSCOPY W/ ACL RECONSTRUCTION     left   PENILE PROSTHESIS IMPLANT N/A 02/27/2022   Procedure: INSERTION OF INFLATABLE PENILE IMPLANT;  Surgeon: Lovie Arlyss CROME, MD;  Location: [facility];  Service: Urology;  Laterality: N/A;  90 MINUTES NEEDED FOR CASE   ROTATOR CUFF REPAIR     Right x2   Stress Cardiolite  07/15/2000   TOTAL KNEE ARTHROPLASTY Right 11/04/2017   TOTAL KNEE ARTHROPLASTY Right 11/04/2017   Procedure: TOTAL KNEE ARTHROPLASTY;  Surgeon: Jane Charleston, MD;  Location: [facility] OR;  Service: Orthopedics;  Laterality: Right;   TOTAL KNEE ARTHROPLASTY Left 12/23/2017   TOTAL KNEE ARTHROPLASTY Left 12/23/2017   Procedure: TOTAL KNEE ARTHROPLASTY;  Surgeon: Jane Charleston, MD;  Location[facility];  Service: Orthopedics;  Laterality: Left;   VASECTOMY  05/2002   Social History   Socioeconomic History   Marital status: Married    Spouse name: Not on file   Number of children: 2   Years of education: Not on file   Highest education level: Bachelor's degree (e.g., BA, AB, BS)  Occupational History   Occupation: Retired Water engineer for [employer]  Tobacco Use   Smoking status: Never   Smokeless tobacco: Never  Vaping Use   Vaping status: Never Used  Substance and Sexual  Activity   Alcohol use: Not Currently    Alcohol/week: 1.0 standard drink of alcohol    Types: 1 Cans of beer per week    Comment: weekly   Drug use: No   Sexual activity: Not on file  Other Topics Concern   Not on file  Social History Narrative   Not on file   Social Drivers of Health   Financial Resource Strain: Low Risk  (05/24/2023)   Overall Financial Resource Strain (CARDIA)    Difficulty of Paying Living Expenses: Not hard at all  Food Insecurity: No Food Insecurity (05/24/2023)   Hunger Vital Sign    Worried About Running Out of Food in the Last Year: Never true    Ran Out of Food in the Last Year: Never true  Transportation Needs: No Transportation Needs (05/24/2023)   PRAPARE - Administrator, Civil Service (Medical): No    Lack of Transportation (Non-Medical): No  Physical Activity: Sufficiently Active (05/24/2023)   Exercise Vital Sign    Days of Exercise per Week: 3 days    Minutes of Exercise per Session: 150+ min  Stress: No Stress Concern Present (05/24/2023)   [employer] of Occupational Health - Occupational Stress Questionnaire    Feeling of Stress : Not at all  Social Connections: Socially Integrated (05/24/2023)   Social Connection and Isolation Panel    Frequency of Communication with Friends and Family: More than three times a week    Frequency of Social Gatherings with Friends and Family: Three times a week    Attends Religious Services: More than 4 times per year    Active Member of Clubs or Organizations: Yes    Attends Banker Meetings: More than 4 times per year    Marital Status: Married  Catering manager Violence: Not At Risk (11/05/2022)   Humiliation, Afraid, Rape, and Kick questionnaire    Fear of Current or Ex-Partner: No    Emotionally Abused: No    Physically Abused: No    Sexually Abused: No   Current Outpatient Medications on File Prior to Visit  Medication Sig Dispense Refill   Accu-Chek Softclix Lancets lancets  Use to check blood sugar 6 times a day 600 each 2   ascorbic acid  (VITAMIN C) 500 MG tablet Take 1 tablet (500 mg total) by mouth daily. 20 tablet 0   aspirin  EC 81 MG tablet Take 81 mg by mouth daily. Swallow whole.     atorvastatin  (LIPITOR) 40 MG tablet TAKE 1 TABLET BY MOUTH DAILY 90 tablet 3   azithromycin  (ZITHROMAX ) 500 MG tablet Take 1 tablet (500 mg total) by mouth daily. Take daily for 3 days as needed for diarrhea. 6 tablet 0   Blood Glucose Monitoring Suppl (ACCU-CHEK GUIDE ME) w/Device KIT Use to check blood sugar 6 times a day 1 kit 0   Calcium  Carbonate (CALCIUM  600 PO) Take 1 tablet by mouth daily.     cephALEXin  (KEFLEX ) 500 MG capsule Take 1 capsule (500 mg total) by mouth 2 (two) times daily. 10 capsule 0   Cinnamon 500 MG capsule Take 3,000 mg by mouth 2 (two) times daily.     colesevelam  (WELCHOL ) 625 MG tablet TAKE 2 TABLETS BY MOUTH TWICE  DAILY WITH A MEAL 360 tablet 3   Continuous Glucose Sensor (DEXCOM G7 SENSOR) MISC USE TO CHECK BLOOD GLUCOSE  CONTINUOUSLY, CHANGE SENSOR  EVERY 10 DAYS 9 each 3   cyclobenzaprine  (FLEXERIL ) 5 MG tablet Take 1 tablet (5 mg total) by mouth at bedtime as needed. 30 tablet 0   docusate sodium  (COLACE) 100 MG capsule Take 100 mg by mouth 2 (two) times daily. Takes daily     Glucagon  3 MG/DOSE POWD Place 3 mg into the nose once as needed for up to 1 dose.  1 each 11   Glucosamine-Chondroit-Vit C-Mn (GLUCOSAMINE CHONDR 1500 COMPLX PO) Take 2 tablets by mouth daily.     glucose blood (ACCU-CHEK GUIDE TEST) test strip CHECK BLOOD SUGAR 6 TIMES DAILY 600 strip 3   Insulin  Disposable Pump (OMNIPOD 5 G7 PODS, GEN 5,) MISC 1 each by Does not apply route every 3 (three) days. 30 each 3   insulin  lispro (HUMALOG ) 100 UNIT/ML cartridge Inject 0.5 mLs (50 Units total) into the skin daily. Via pump 45 mL 3   insulin  lispro (HUMALOG ) 100 UNIT/ML injection INJECT SUBCUTANEOUSLY 50 UNITS  DAILY VIA INSULIN  PUMP (Patient taking differently: INJECT  SUBCUTANEOUSLY 50 UNITS  DAILY VIA INSULIN  PUMP) 50 mL 3   Insulin  Lispro-aabc 100 UNIT/ML SOLN Inject 50 Units as directed daily. Via pump 50 mL 1   Insulin  Pen Needle 31G X 5 MM MISC 1 Device by Does not apply route in the morning and at bedtime. 200 each 3   Insulin  Syringe/Needle U-500 (BD INSULIN  SYRINGE U-500) 31G X 0.5 ML MISC Use to inject insulin  3 times a day if pump malfunctions. 300 each 1   levothyroxine  (SYNTHROID ) 88 MCG tablet TAKE 1 TABLET BY MOUTH DAILY  BEFORE BREAKFAST 90 tablet 0   meloxicam  (MOBIC ) 15 MG tablet TAKE 1 TABLET(15 MG) BY MOUTH DAILY 30 tablet 0   metFORMIN  (GLUCOPHAGE -XR) 500 MG 24 hr tablet TAKE 2 TABLETS BY MOUTH TWICE  DAILY 360 tablet 3   Multiple Vitamin (MULTIVITAMIN) tablet Take 1 tablet by mouth daily.     MYRBETRIQ  50 MG TB24 tablet Take 50 mg by mouth daily.     omega-3 acid ethyl esters (LOVAZA ) 1 g capsule TAKE 2 CAPSULES BY MOUTH TWICE  DAILY 360 capsule 3   Semaglutide ,0.25 or 0.5MG /DOS, (OZEMPIC , 0.25 OR 0.5 MG/DOSE,) 2 MG/3ML SOPN INJECT SUBCUTANEOUSLY 0.5 MG  EVERY WEEK 9 mL 3   tadalafil  (ADCIRCA /CIALIS ) 20 MG tablet Take 20 mg by mouth daily as needed for erectile dysfunction.     Trospium Chloride 60 MG CP24 Take 60 mg by mouth daily.     Turmeric 500 MG CAPS Take 500-1,000 mg by mouth daily. May take an extra dose if plays golf or tennis     VITAMIN D , CHOLECALCIFEROL , PO Take 1 tablet by mouth daily.     vitamin E 200 UNIT capsule Take 200 Units by mouth daily.     No current facility-administered medications on file prior to visit.   Allergies  Allergen Reactions   Bactrim  [Sulfamethoxazole -Trimethoprim ]    Family History  Problem Relation Age of Onset   Hypertension Mother    Kidney disease Father    Breast cancer Neg Hx    Celiac disease Neg Hx    Cirrhosis Neg Hx    Clotting disorder Neg Hx    Colitis Neg Hx    Colon cancer Neg Hx    Colon polyps Neg Hx    Crohn's disease Neg Hx    Cystic fibrosis Neg Hx    Diabetes  Neg Hx    Esophageal cancer Neg Hx    Heart disease Neg Hx    Hemochromatosis Neg Hx    Inflammatory bowel disease Neg Hx    Irritable bowel syndrome Neg Hx    Liver cancer Neg Hx    Liver disease Neg Hx    Ovarian cancer Neg Hx    Pancreatic cancer Neg Hx    Prostate cancer Neg Hx    Rectal cancer Neg Hx  Stomach cancer Neg Hx    Ulcerative colitis Neg Hx    Uterine cancer Neg Hx    Wilson's disease Neg Hx    PE: BP 120/70   Pulse 78   Ht 5' 6.5 (1.689 m)   Wt 140 lb 9.6 oz (63.8 kg)   SpO2 97%   BMI 22.35 kg/m  Wt Readings from Last 3 Encounters:  09/02/23 140 lb 9.6 oz (63.8 kg)  07/17/23 133 lb (60.3 kg)  05/31/23 137 lb 6.4 oz (62.3 kg)   Constitutional: normal weight, in NAD Eyes: EOMI, no exophthalmos ENT:  no thyromegaly, no cervical lymphadenopathy Cardiovascular: RRR, No MRG Respiratory: CTA B Musculoskeletal: no deformities Skin: no rashes Neurological: no tremor with outstretched hands  ASSESSMENT: 1. DM2, controlled, with complications - CAD  2. HL  3.  Hypothyroidism  PLAN:  1. Patient with longstanding, well-controlled, type 1 diabetes, on the OmniPod 5 insulin  pump integrated with the Dexcom CGM.  His blood sugars are usually well-controlled, per review of his CGM trends. We discussed in the past about trying to be less aggressive (he was bolusing more than the pump was calculated for him) and follow the pump instructions.  At last visit, sugars were very well-controlled, fluctuating in the lower half of the target range with occasional mild hyperglycemic values, under 70s in the second half of the day.  Upon questioning, he was entering slightly higher amounts of carbs than he needed to account for protein.  I advised him not to override the bolus wizard as much as he could.  I did not recommend a change in regimen but I did advise him that if the sugars continue to drop overnight to raise the target blood glucose to 120.  Also, we discussed that  for steroid injections, he may need a stronger ICR for at least 1-2 days. -HbA1c 3 months ago was slightly higher, at 6.0%, but still at goal. CGM interpretation: -At today's visit, we reviewed his CGM downloads: It appears that 92% of values are in target range (goal >70%), while 2% are higher than 180 (goal <25%), and 6% are lower than 70 (goal <4%).  The calculated average blood sugar is 109.  The projected HbA1c for the next 3 months (GMI) is 5.9%. -Reviewing the CGM trends, sugars appear to be fluctuating within the target range, but dropping to the low normal range, but frequently under 70s and occasionally even under 54 after lunch and dinner.  Upon reviewing individual daily tracings, he is dropping his blood sugars consistently after meals.  Very frequently he has to get apple juice to raise his blood sugars so that they do not drop too low.  He is entering 95 g of carb for all of his meals, which we discussed it would be incorrect.  He tells me that he is trying to enter enough carbs to cover not only the consumed carbohydrate, but also the protein and fat.  We discussed that as of now, he is over bolusing for meals and I suggested to only count the carbs and enter these into the pump.  Ideally, he could use an extended bolus to account for the fatty foods eaten, but we do not have this option on the OmniPod.  I strongly recommended to relax his insulin  to carb ratio but he is reticent to do so.  We discussed about the possible consequences of dropping his sugars too low including an increased risk for arrhythmia and hypercoagulability.  He remains reticent to  bolus less for the meals.  I recommended a referral to nutrition/diabetes education in that case.  I did advise him that I cannot condone his practice of over bolusing for meals. -I advised him to: Patient Instructions  Please use the following pump settings: - basal rates: 12 am: 0.25 units/h 5 am: 0.45 7 am: 0.3 - ICR: 1:7 >> 1:8 (if  sugars still drop after meals, please relax the ICRs more, to 1:9) - target: 110 - ISF: 1:40 - Active insulin  time: 2h  Try to enter in the pump just the amount of carbs you eat, not more.  Please continue: - Metformin  ER 2000 mg daily - Ozempic  0.5 mg weekly  Please schedule an appt with Rock Dasen.  Please continue Levothyroxine  88 mcg daily.  Take the thyroid  hormone every day, with water , at least 30 minutes before breakfast, separated by at least 4 hours from: - acid reflux medications - calcium  - iron - multivitamins   Please stop at the lab.  Please return in ~6 months  - we checked his HbA1c: 5.9% (lower) - advised to check sugars at different times of the day - 4x a day, rotating check times - advised for yearly eye exams >> he is UTD -He had an elevated ACR, which improved at last check - return to clinic in 6 months  2. HL -Latest lipid panel was at goal: Lab Results  Component Value Date   CHOL 105 06/10/2023   HDL 65.20 06/10/2023   LDLCALC 30 06/10/2023   TRIG 47.0 06/10/2023   CHOLHDL 2 06/10/2023  -He is on Lipitor 40 mg daily, WelChol  1250 mg twice a day omega-3 fatty acids 2000 mg twice a day.  No side effects  3.  Hypothyroidism - latest thyroid  labs reviewed with pt. >> TSH was slightly elevated: Lab Results  Component Value Date   TSH 5.09 (H) 05/31/2023  - he continues on LT4 88 mcg daily (dose increased 09/2022) - pt feels good on this dose. - we discussed about taking the thyroid  hormone every day, with water , >30 minutes before breakfast, separated by >4 hours from acid reflux medications, calcium , iron, multivitamins. Pt. is taking it correc at last visit, he had moved calcium  and his acid reflux medication at night but he was still taking the multivitamins in the morning, 1 hour after LT4.  I advised him to move this at least 4 hours later. - will check thyroid  tests today: TSH and fT4 - If labs are abnormal, he will need to return for  repeat TFTs in 1.5 months  Orders Placed This Encounter  Procedures   TSH   T4, free   Amb Referral to DSME/T   POCT glycosylated hemoglobin (Hb A1C)   Component     Latest Ref Rng 09/02/2023  Hemoglobin A1C     4.0 - 5.6 % 5.9 !   TSH     0.40 - 4.50 mIU/L 10.07 (H)   T4,Free(Direct)     0.8 - 1.8 ng/dL 1.2   TSH is higher.  I would recommend to increase the dose of levothyroxine  to 100 mcg daily and recheck his TFTs in 1.5 months.  Lela Fendt, MD PhD [facility]

## 2023-09-02 NOTE — Patient Instructions (Addendum)
 Please use the following pump settings: - basal rates: 12 am: 0.25 units/h 5 am: 0.45 7 am: 0.3 - ICR: 1:7 >> 1:8 (if sugars still drop after meals, please relax the ICRs more, to 1:9) - target: 110 - ISF: 1:40 - Active insulin  time: 2h  Try to enter in the pump just the amount of carbs you eat, not more.  Please continue: - Metformin  ER 2000 mg daily - Ozempic  0.5 mg weekly  Please schedule an appt with Rock Dasen.  Please continue Levothyroxine  88 mcg daily.  Take the thyroid  hormone every day, with water , at least 30 minutes before breakfast, separated by at least 4 hours from: - acid reflux medications - calcium  - iron - multivitamins   Please stop at the lab.  Please return in ~6 months

## 2023-09-03 ENCOUNTER — Telehealth: Payer: Self-pay | Admitting: Gastroenterology

## 2023-09-03 ENCOUNTER — Ambulatory Visit: Payer: Self-pay | Admitting: Internal Medicine

## 2023-09-03 MED ORDER — LEVOTHYROXINE SODIUM 100 MCG PO TABS
100.0000 ug | ORAL_TABLET | Freq: Every day | ORAL | 3 refills | Status: DC
Start: 2023-09-03 — End: 2023-12-16

## 2023-09-03 NOTE — Telephone Encounter (Signed)
 Inbound call from patient stating he recently had a CT Virtual Colonoscopy on 8/5 and is wanting to know when he would get a call discussing results   Please advise  Thank you

## 2023-09-03 NOTE — Addendum Note (Signed)
 Addended by: TRIXIE FILE on: 09/03/2023 03:23 PM   Modules accepted: Orders

## 2023-09-03 NOTE — Telephone Encounter (Signed)
 Patient is advised that radiology has not yet read his CT virtual colon report. Advised that as soon as the report becomes available and is reviewed by our ordering physician, we will reach out to him with those results/recommendations. He verbalizes understanding.

## 2023-09-04 ENCOUNTER — Encounter: Attending: Internal Medicine | Admitting: Nutrition

## 2023-09-04 DIAGNOSIS — E119 Type 2 diabetes mellitus without complications: Secondary | ICD-10-CM | POA: Insufficient documentation

## 2023-09-05 ENCOUNTER — Other Ambulatory Visit: Payer: Self-pay

## 2023-09-05 DIAGNOSIS — E039 Hypothyroidism, unspecified: Secondary | ICD-10-CM

## 2023-09-05 NOTE — Progress Notes (Signed)
 Patient is purposely putting in a large bolus and treating lows afterward to prevent high pc blood sugar readings resulting in higher HgbA1C.  Says he need some lows to level his Hgb A1C from going up because his pc readings go over 150 occasionally.  He prefers an HgbA1C in the high 5 percent range.  1. Discussed cardiac issues resulting when blood sugars drop into the 50s as well as his glucagon  response that gets reduced with each low, until a time of 24 hours when it will resume normally.  2  Blood sugars reviewed, and he was shown that his blood sugars do not rise at all pcB and pcL because he waits 30-60  minutes after taking insulin , before eating. Suggested that these 2 times would be a good time to maybe reduce his bolus-starting at 85 grams in place of 95 to see what happens after the meal, and to prevent lows.  He agreed to do this. 3. Also, he does not respond when low alerts happen at 70, and only responds when they drop below 60.  Discussed possiblity of treating at 58, and he agreed to try this.  He admits that he likes to treat his low with special treats that his wife makes of chocolate and candy.   4.  Reviewed the fact that complications from diabetes do not result until years of high blood sugar readings. Also suggested that because we are the same age, that the ADA suggests Hgb A1Cs be in the 7% range, because the odds that  we  both will most likely  die from CA, or heart problems in stead of diabetes complications.  Not sure if he believed this, saying his father lived until 63 and his mother 34.

## 2023-09-05 NOTE — Patient Instructions (Signed)
 Reduce bolus amounts to 85 before breakfast and lunch Treat lows when alerts go off at 70 instead of waiting until 60.

## 2023-09-06 ENCOUNTER — Telehealth: Payer: Self-pay | Admitting: *Deleted

## 2023-09-06 ENCOUNTER — Other Ambulatory Visit: Payer: Self-pay

## 2023-09-06 ENCOUNTER — Encounter: Payer: Self-pay | Admitting: Gastroenterology

## 2023-09-06 NOTE — Telephone Encounter (Signed)
===  View-only below this line=== ----- Message ----- From: Stacia Glendia BRAVO, MD Sent: 09/06/2023  12:53 PM EDT To: Naomie LOISE Sharps, RN Subject: RE: Virtual COLONOSCOPY                        I spoke with the patient this afternoon, informed him that we would need to repeat an optical colonoscopy.  The patient was agreeable.  Please book patient for next available colonoscopy in the [facility].   Patient has history of inadequate bowel preps.  Plan for 2-day bowel prep.  Thanks

## 2023-09-06 NOTE — Telephone Encounter (Signed)
 Spoke to patient who was offered first available colonoscopy date with Dr Stacia for 09/25/23. Patient states that he will not be able to come 9/3. He has now been scheduled for colonoscopy 10/05/23 at 1230 pm. He has also scheduled an in person previsit for 09/13/23 at 4 pm.   Recommendation for 2 day prep placed in previsit scheduling instructions.  Patient is advised that he will need to hold ozempic  7 days prior to his colonoscopy but he will also be advised of this at his upcoming previsit. He verbalizes understanding.  Additionally, patient uses an Omnipod insulin  pump. Will send a note to prescriber, Dr Irean about this.

## 2023-09-06 NOTE — Telephone Encounter (Signed)
 David Maynard Mar 21, 1950 [id number]   Dear Dr. Trixie    We have scheduled the above individual for a colonoscopy at [facility] on 10/04/23.  Our records show that this patient is on insulin  therapy via an omnipod insulin  pump.  Our colonoscopy prep protocol requires that:   the patient must be on a clear liquid diet the entire day prior to the procedure date as well as the morning of the procedure  the patient must be NPO for 3 to 4 hours prior to the procedure   the patient must consume a PEG 3350  solution to prepare for the procedure.  Please advise us  of any adjustments that patient may need for insulin  pump therapy prior to the above procedure date.    Please route your response to Naomie Sharps, RN If you have any questions, please call reach out at [phone].  Thank you for your help with this.  Sincerely,  [facility]

## 2023-09-08 ENCOUNTER — Ambulatory Visit: Payer: Self-pay | Admitting: Gastroenterology

## 2023-09-08 NOTE — Progress Notes (Signed)
 Please see patient message regarding virtual colonoscopy results, and plans for repeat attempted colonoscopy with 2-day prep to further assess focal narrowing of distal sigmoid colon.  Patient currently scheduled for September 13.

## 2023-09-09 NOTE — Telephone Encounter (Signed)
 Patient to be advised of these recommendations at time of previsit on 09/13/23.

## 2023-09-12 ENCOUNTER — Encounter

## 2023-09-13 ENCOUNTER — Ambulatory Visit (AMBULATORY_SURGERY_CENTER)

## 2023-09-13 ENCOUNTER — Encounter

## 2023-09-13 VITALS — Ht 66.0 in | Wt 138.4 lb

## 2023-09-13 DIAGNOSIS — R9389 Abnormal findings on diagnostic imaging of other specified body structures: Secondary | ICD-10-CM

## 2023-09-13 MED ORDER — NA SULFATE-K SULFATE-MG SULF 17.5-3.13-1.6 GM/177ML PO SOLN: 1.0000 | Freq: Once | ORAL | 0 refills | Status: AC

## 2023-09-13 NOTE — Progress Notes (Signed)
 No egg or soy allergy known to patient  No issues known to pt with past sedation with any surgeries or procedures Patient denies ever being told they had issues or difficulty with intubation  No FH of Malignant Hyperthermia  Pt is not on diet pills; Takes Ozempic  and hold instructions provided  2 day prep instructions provided  Pt is not on  home 02  Pt is not on blood thinners  Pt denies issues with constipation  No A fib or A flutter Have any cardiac testing pending--No Pt can ambulate  Pt denies use of chewing tobacco Discussed diabetic I weight loss medication holds Discussed NSAID holds Checked BMI Pt instructed to use Singlecare.com or GoodRx for a price reduction on prep  Patient's chart reviewed by Norleen Schillings CNRA prior to previsit and patient appropriate for the [facility].  Pre visit completed and red dot placed by patient's name on their procedure day (on provider's schedule).

## 2023-09-16 ENCOUNTER — Encounter: Payer: Self-pay | Admitting: Gastroenterology

## 2023-09-18 ENCOUNTER — Encounter: Payer: Self-pay | Admitting: Internal Medicine

## 2023-09-18 DIAGNOSIS — E119 Type 2 diabetes mellitus without complications: Secondary | ICD-10-CM

## 2023-09-18 MED ORDER — INSULIN LISPRO 100 UNIT/ML IJ SOLN: INTRAMUSCULAR | 3 refills | Status: DC

## 2023-09-18 NOTE — Telephone Encounter (Signed)
 Pt has been notified and I sent in a new prescription   Requested Prescriptions   Signed Prescriptions Disp Refills   insulin  lispro (HUMALOG ) 100 UNIT/ML injection 60 mL 3    Sig: INJECT SUBCUTANEOUSLY 60 UNITS  DAILY VIA INSULIN  PUMP    Authorizing Provider: TRIXIE FILE    Ordering User: CLEOTILDE ROLIN RAMAN

## 2023-09-20 ENCOUNTER — Other Ambulatory Visit: Payer: Self-pay

## 2023-09-20 DIAGNOSIS — E119 Type 2 diabetes mellitus without complications: Secondary | ICD-10-CM

## 2023-09-20 MED ORDER — INSULIN LISPRO 100 UNIT/ML IJ SOLN: INTRAMUSCULAR | 3 refills | Status: DC

## 2023-09-25 ENCOUNTER — Encounter: Admitting: Gastroenterology

## 2023-10-03 ENCOUNTER — Encounter: Payer: Self-pay | Admitting: Family Medicine

## 2023-10-03 ENCOUNTER — Other Ambulatory Visit: Payer: Self-pay | Admitting: *Deleted

## 2023-10-03 MED ORDER — COVID-19 MRNA VACCINE (PFIZER) 30 MCG/0.3ML IM SUSP: 0.3000 mL | Freq: Once | INTRAMUSCULAR | 0 refills | Status: AC

## 2023-10-04 ENCOUNTER — Ambulatory Visit (AMBULATORY_SURGERY_CENTER): Admitting: Gastroenterology

## 2023-10-04 ENCOUNTER — Encounter: Payer: Self-pay | Admitting: Gastroenterology

## 2023-10-04 VITALS — BP 114/61 | HR 95 | Temp 97.2°F | Resp 16 | Ht 66.0 in | Wt 138.0 lb

## 2023-10-04 DIAGNOSIS — K562 Volvulus: Secondary | ICD-10-CM | POA: Diagnosis not present

## 2023-10-04 DIAGNOSIS — D123 Benign neoplasm of transverse colon: Secondary | ICD-10-CM

## 2023-10-04 DIAGNOSIS — R9389 Abnormal findings on diagnostic imaging of other specified body structures: Secondary | ICD-10-CM

## 2023-10-04 DIAGNOSIS — Q439 Congenital malformation of intestine, unspecified: Secondary | ICD-10-CM | POA: Diagnosis not present

## 2023-10-04 DIAGNOSIS — Q438 Other specified congenital malformations of intestine: Secondary | ICD-10-CM | POA: Diagnosis not present

## 2023-10-04 LAB — PSA: PSA: 6.8

## 2023-10-04 MED ORDER — SODIUM CHLORIDE 0.9 % IV SOLN: 500.0000 mL | Freq: Once | INTRAVENOUS | Status: DC

## 2023-10-04 NOTE — Progress Notes (Signed)
 Called to room to assist during endoscopic procedure.  Patient ID and intended procedure confirmed with present staff. Received instructions for my participation in the procedure from the performing physician.

## 2023-10-04 NOTE — Progress Notes (Signed)
 Sedate, gd SR, tolerated procedure well, VSS, report to RN

## 2023-10-04 NOTE — Op Note (Signed)
 [facility] Patient Name: David Maynard Procedure Date: 10/04/2023 1:21 PM MRN: [id number] Endoscopist: Glendia E. Stacia , MD, [id number] Age: 73 Referring MD:  Date of Birth: 07/28/1950 Gender: Male Account #: [id number] Procedure:                Colonoscopy Indications:              Abnormal virtual colonoscopy Medicines:                Monitored Anesthesia Care Procedure:                Pre-Anesthesia Assessment:                           - Prior to the procedure, a History and Physical                            was performed, and patient medications and                            allergies were reviewed. The patient's tolerance of                            previous anesthesia was also reviewed. The risks                            and benefits of the procedure and the sedation                            options and risks were discussed with the patient.                            All questions were answered, and informed consent                            was obtained. Prior Anticoagulants: The patient has                            taken no anticoagulant or antiplatelet agents. ASA                            Grade Assessment: II - A patient with mild systemic                            disease. After reviewing the risks and benefits,                            the patient was deemed in satisfactory condition to                            undergo the procedure.                           After obtaining informed consent, the colonoscope  was passed under direct vision. Throughout the                            procedure, the patient's blood pressure, pulse, and                            oxygen saturations were monitored continuously. The                            CF [id number] #[id number] was introduced through the anus                            and advanced to the the cecum, identified by                            appendiceal orifice and  ileocecal valve. The                            colonoscopy was unusually difficult due to a                            redundant colon, significant looping and a tortuous                            colon. Successful completion of the procedure was                            aided by abdominal binder. The patient tolerated                            the procedure well. The quality of the bowel                            preparation was fair. The ileocecal valve,                            appendiceal orifice, and rectum were photographed.                            The bowel preparation used was SUPREP via extended                            prep with split dose instruction. Scope In: 1:38:33 PM Scope Out: 2:22:00 PM Scope Withdrawal Time: 0 hours 18 minutes 16 seconds  Total Procedure Duration: 0 hours 43 minutes 27 seconds  Findings:                 The perianal and digital rectal examinations were                            normal. Pertinent negatives include normal                            sphincter tone and no palpable rectal lesions.  A 2 mm polyp was found in the transverse colon. The                            polyp was sessile. Due to the significant looping                            of the colon, the polyp was not able to get into a                            position for resection and was lost to visualization                           The exam was otherwise normal throughout the                            examined colon.                           The retroflexed view of the distal rectum and anal                            verge was normal and showed no anal or rectal                            abnormalities. Complications:            No immediate complications. Estimated Blood Loss:     Estimated blood loss: none. Impression:               - One 2 mm polyp in the transverse colon. Unable to                            be resected due to excessive  colon looping                           - The distal rectum and anal verge are normal on                            retroflexion view.                           - No specimens collected. Recommendation:           - Patient has a contact number available for                            emergencies. The signs and symptoms of potential                            delayed complications were discussed with the                            patient. Return to normal activities tomorrow.  Written discharge instructions were provided to the                            patient.                           - Resume previous diet.                           - Continue present medications.                           - Consider repeat colonoscopy or virtual                            colonoscopy in 5 years. Sharlisa Hollifield E. Stacia, MD 10/04/2023 2:29:44 PM This report has been signed electronically.

## 2023-10-04 NOTE — Progress Notes (Signed)
 [facility] History and Physical   Primary Care Physician:  Kennyth Worth HERO, MD   Reason for Procedure:   Abnormal virtual colonoscopy  Plan:    Colonoscopy     HPI: David Maynard is a 73 y.o. male undergoing colonoscopy following an abnormal virtual colonoscopy in August with a suspected focal narrowing in the distal sigmoid.    He has had difficulty with colon cancer screening due to poor prep and a very redundant sigmoid colon which resulted in an imcomplete colonoscopy in 2022 and again in 2023.  A subsequent barium enemas was performed and was unremarkable, although of suboptimal quality.  A successful colonoscopy in 2012 was negative for polyps.  He has no family history of colon cancer.  Barium enema Feb 2023 Limited exam, no mass or polyp appreciated Redundant sigmoid colon     Colonoscopy  Feb 2023 (Dr. Aneita) Indication: Screening Aborted due to inadequate prep and technically difficult colon (redundant, looping, tortuous)     Colonoscopy Oct 2022 (Dr. Aneita) Indication: Screening Aborted due to poor prep and technically difficult colon (redundant, looping, tortuous)     Colonoscopy Mar 2012 (Dr. 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   and left   Thyroid  disease    Unspecified hypothyroidism     Past Surgical History:  Procedure  Laterality Date   COLONOSCOPY     COLONOSCOPY WITH PROPOFOL   02/28/2021   ESOPHAGOGASTRODUODENOSCOPY  06/16/2003   EYE SURGERY     INGUINAL HERNIA REPAIR     left   JOINT REPLACEMENT     KNEE ARTHROSCOPY     Left   KNEE ARTHROSCOPY W/ ACL RECONSTRUCTION     left   PENILE PROSTHESIS IMPLANT N/A 02/27/2022   Procedure: INSERTION OF INFLATABLE PENILE IMPLANT;  Surgeon: Lovie Arlyss CROME, MD;  Location: [facility];  Service: Urology;  Laterality: N/A;  90 MINUTES NEEDED FOR CASE   ROTATOR CUFF REPAIR     Right x2   Stress Cardiolite  07/15/2000   TOTAL KNEE ARTHROPLASTY Right 11/04/2017   TOTAL KNEE ARTHROPLASTY Right 11/04/2017   Procedure: TOTAL KNEE ARTHROPLASTY;  Surgeon: Jane Charleston, MD;  Location: [facility] OR;  Service: Orthopedics;  Laterality: Right;   TOTAL KNEE ARTHROPLASTY Left 12/23/2017   TOTAL KNEE ARTHROPLASTY Left 12/23/2017   Procedure: TOTAL KNEE ARTHROPLASTY;  Surgeon: Jane Charleston, MD;  Location: [facility] OR;  Service: Orthopedics;  Laterality: Left;   VASECTOMY  05/2002    Prior to Admission medications   Medication Sig Start Date End Date Taking? Authorizing Provider  Accu-Chek Softclix Lancets lancets Use to check blood sugar 6 times a day 12/17/22  Yes Trixie File, MD  ascorbic acid  (VITAMIN C) 500 MG tablet Take 1 tablet (500 mg total) by mouth daily. 09/28/19  Yes Kathrin Mignon DASEN, MD  aspirin  EC  81 MG tablet Take 81 mg by mouth daily. Swallow whole.   Yes [provider]  atorvastatin  (LIPITOR) 40 MG tablet TAKE 1 TABLET BY MOUTH DAILY 05/06/23  Yes Parker, Caleb M, MD  Blood Glucose Monitoring Suppl (ACCU-CHEK GUIDE ME) w/Device KIT Use to check blood sugar 6 times a day 12/17/22  Yes Trixie File, MD  Calcium  Carbonate (CALCIUM  600 PO) Take 1 tablet by mouth daily.   Yes [provider]  Cinnamon 500 MG capsule Take 3,000 mg by mouth 2 (two) times daily.   Yes [provider]  colesevelam  (WELCHOL ) 625 MG tablet TAKE 2 TABLETS BY MOUTH  TWICE  DAILY WITH A MEAL 12/19/22  Yes Trixie File, MD  Continuous Glucose Sensor (DEXCOM G7 SENSOR) MISC USE TO CHECK BLOOD GLUCOSE  CONTINUOUSLY, CHANGE SENSOR  EVERY 10 DAYS 08/09/23  Yes Trixie File, MD  docusate sodium  (COLACE) 100 MG capsule Take 100 mg by mouth 2 (two) times daily. Takes daily   Yes [provider]  Glucosamine-Chondroit-Vit C-Mn (GLUCOSAMINE CHONDR 1500 COMPLX PO) Take 2 tablets by mouth daily.   Yes [provider]  glucose blood (ACCU-CHEK GUIDE TEST) test strip CHECK BLOOD SUGAR 6 TIMES DAILY 02/19/23  Yes Trixie File, MD  Insulin  Disposable Pump (OMNIPOD 5 G7 PODS, GEN 5,) MISC 1 each by Does not apply route every 3 (three) days. 12/04/22  Yes Trixie File, MD  insulin  lispro (HUMALOG ) 100 UNIT/ML cartridge Inject 0.5 mLs (50 Units total) into the skin daily. Via pump 05/22/22  Yes Trixie File, MD  insulin  lispro (HUMALOG ) 100 UNIT/ML injection INJECT SUBCUTANEOUSLY 60 UNITS  DAILY VIA INSULIN  PUMP 09/20/23  Yes Trixie File, MD  Insulin  Lispro-aabc 100 UNIT/ML SOLN Inject 50 Units as directed daily. Via pump 05/22/22  Yes Trixie File, MD  Insulin  Pen Needle 31G X 5 MM MISC 1 Device by Does not apply route in the morning and at bedtime. 07/31/20  Yes Kassie Mallick, MD  Insulin  Syringe/Needle U-500 (BD INSULIN  SYRINGE U-500) 31G X 0.5 ML MISC Use to inject insulin  3 times a day if pump malfunctions. 06/24/23  Yes Trixie File, MD  levothyroxine  (SYNTHROID ) 100 MCG tablet Take 1 tablet (100 mcg total) by mouth daily. 09/03/23  Yes Trixie File, MD  metFORMIN  (GLUCOPHAGE -XR) 500 MG 24 hr tablet TAKE 2 TABLETS BY MOUTH TWICE  DAILY 06/21/23  Yes Trixie File, MD  Multiple Vitamin (MULTIVITAMIN) tablet Take 1 tablet by mouth daily.   Yes [provider]  MYRBETRIQ  50 MG TB24 tablet Take 50 mg by mouth daily. 03/31/20  Yes [provider]  omega-3 acid ethyl esters (LOVAZA ) 1 g capsule TAKE 2  CAPSULES BY MOUTH TWICE  DAILY 08/09/23  Yes Parker, Caleb M, MD  Trospium Chloride 60 MG CP24 Take 60 mg by mouth daily. 12/03/18  Yes [provider]  Turmeric 500 MG CAPS Take 500-1,000 mg by mouth daily. May take an extra dose if plays golf or tennis   Yes [provider]  VITAMIN D , CHOLECALCIFEROL , PO Take 1 tablet by mouth daily.   Yes [provider]  vitamin E 200 UNIT capsule Take 200 Units by mouth daily.   Yes [provider]  Glucagon  3 MG/DOSE POWD Place 3 mg into the nose once as needed for up to 1 dose. 09/25/22   Trixie File, MD  Semaglutide ,0.25 or 0.5MG /DOS, (OZEMPIC , 0.25 OR 0.5 MG/DOSE,) 2 MG/3ML SOPN INJECT SUBCUTANEOUSLY 0.5 MG  EVERY WEEK 06/21/23   Trixie File,  MD  tadalafil  (ADCIRCA /CIALIS ) 20 MG tablet Take 20 mg by mouth daily as needed for erectile dysfunction.    [provider]    Current Outpatient Medications  Medication Sig Dispense Refill   Accu-Chek Softclix Lancets lancets Use to check blood sugar 6 times a day 600 each 2   ascorbic acid  (VITAMIN C) 500 MG tablet Take 1 tablet (500 mg total) by mouth daily. 20 tablet 0   aspirin  EC 81 MG tablet Take 81 mg by mouth daily. Swallow whole.     atorvastatin  (LIPITOR) 40 MG tablet TAKE 1 TABLET BY MOUTH DAILY 90 tablet 3   Blood Glucose Monitoring Suppl (ACCU-CHEK GUIDE ME) w/Device KIT Use to check blood sugar 6 times a day 1 kit 0   Calcium  Carbonate (CALCIUM  600 PO) Take 1 tablet by mouth daily.     Cinnamon 500 MG capsule Take 3,000 mg by mouth 2 (two) times daily.     colesevelam  (WELCHOL ) 625 MG tablet TAKE 2 TABLETS BY MOUTH TWICE  DAILY WITH A MEAL 360 tablet 3   Continuous Glucose Sensor (DEXCOM G7 SENSOR) MISC USE TO CHECK BLOOD GLUCOSE  CONTINUOUSLY, CHANGE SENSOR  EVERY 10 DAYS 9 each 3   docusate sodium  (COLACE) 100 MG capsule Take 100 mg by mouth 2 (two) times daily. Takes daily     Glucosamine-Chondroit-Vit C-Mn (GLUCOSAMINE CHONDR 1500 COMPLX PO)  Take 2 tablets by mouth daily.     glucose blood (ACCU-CHEK GUIDE TEST) test strip CHECK BLOOD SUGAR 6 TIMES DAILY 600 strip 3   Insulin  Disposable Pump (OMNIPOD 5 G7 PODS, GEN 5,) MISC 1 each by Does not apply route every 3 (three) days. 30 each 3   insulin  lispro (HUMALOG ) 100 UNIT/ML cartridge Inject 0.5 mLs (50 Units total) into the skin daily. Via pump 45 mL 3   insulin  lispro (HUMALOG ) 100 UNIT/ML injection INJECT SUBCUTANEOUSLY 60 UNITS  DAILY VIA INSULIN  PUMP 60 mL 3   Insulin  Lispro-aabc 100 UNIT/ML SOLN Inject 50 Units as directed daily. Via pump 50 mL 1   Insulin  Pen Needle 31G X 5 MM MISC 1 Device by Does not apply route in the morning and at bedtime. 200 each 3   Insulin  Syringe/Needle U-500 (BD INSULIN  SYRINGE U-500) 31G X 0.5 ML MISC Use to inject insulin  3 times a day if pump malfunctions. 300 each 1   levothyroxine  (SYNTHROID ) 100 MCG tablet Take 1 tablet (100 mcg total) by mouth daily. 45 tablet 3   metFORMIN  (GLUCOPHAGE -XR) 500 MG 24 hr tablet TAKE 2 TABLETS BY MOUTH TWICE  DAILY 360 tablet 3   Multiple Vitamin (MULTIVITAMIN) tablet Take 1 tablet by mouth daily.     MYRBETRIQ  50 MG TB24 tablet Take 50 mg by mouth daily.     omega-3 acid ethyl esters (LOVAZA ) 1 g capsule TAKE 2 CAPSULES BY MOUTH TWICE  DAILY 360 capsule 3   Trospium Chloride 60 MG CP24 Take 60 mg by mouth daily.     Turmeric 500 MG CAPS Take 500-1,000 mg by mouth daily. May take an extra dose if plays golf or tennis     VITAMIN D , CHOLECALCIFEROL , PO Take 1 tablet by mouth daily.     vitamin E 200 UNIT capsule Take 200 Units by mouth daily.     Glucagon  3 MG/DOSE POWD Place 3 mg into the nose once as needed for up to 1 dose. 1 each 11   Semaglutide ,0.25 or 0.5MG /DOS, (OZEMPIC , 0.25 OR 0.5 MG/DOSE,) 2 MG/3ML SOPN  INJECT SUBCUTANEOUSLY 0.5 MG  EVERY WEEK 9 mL 3   tadalafil  (ADCIRCA /CIALIS ) 20 MG tablet Take 20 mg by mouth daily as needed for erectile dysfunction.     Current Facility-Administered Medications   Medication Dose Route Frequency Provider Last Rate Last Admin   0.9 %  sodium chloride  infusion  500 mL Intravenous Once Stacia Glendia BRAVO, MD        Allergies as of 10/04/2023 - Review Complete 10/04/2023  Allergen Reaction Noted   Bactrim  [sulfamethoxazole -trimethoprim ] Other (See Comments) 05/02/2022    Family History  Problem Relation Age of Onset   Hypertension Mother    Kidney disease Father    Breast cancer Neg Hx    Celiac disease Neg Hx    Cirrhosis Neg Hx    Clotting disorder Neg Hx    Colitis Neg Hx    Colon cancer Neg Hx    Colon polyps Neg Hx    Crohn's disease Neg Hx    Cystic fibrosis Neg Hx    Diabetes Neg Hx    Esophageal cancer Neg Hx    Heart disease Neg Hx    Hemochromatosis Neg Hx    Inflammatory bowel disease Neg Hx    Irritable bowel syndrome Neg Hx    Liver cancer Neg Hx    Liver disease Neg Hx    Ovarian cancer Neg Hx    Pancreatic cancer Neg Hx    Prostate cancer Neg Hx    Rectal cancer Neg Hx    Stomach cancer Neg Hx    Ulcerative colitis Neg Hx    Uterine cancer Neg Hx    Wilson's disease Neg Hx     Social History   Socioeconomic History   Marital status: Married    Spouse name: Not on file   Number of children: 2   Years of education: Not on file   Highest education level: Bachelor's degree (e.g., BA, AB, BS)  Occupational History   Occupation: Retired Water engineer for [employer]  Tobacco Use   Smoking status: Never   Smokeless tobacco: Never  Vaping Use   Vaping status: Never Used  Substance and Sexual Activity   Alcohol use: Yes    Alcohol/week: 1.0 standard drink of alcohol    Types: 1 Cans of beer per week    Comment: socially   Drug use: No   Sexual activity: Not on file  Other Topics Concern   Not on file  Social History Narrative   Not on file   Social Drivers of Health   Financial Resource Strain: Low Risk  (05/24/2023)   Overall Financial Resource Strain (CARDIA)    Difficulty of Paying Living Expenses:  Not hard at all  Food Insecurity: No Food Insecurity (05/24/2023)   Hunger Vital Sign    Worried About Running Out of Food in the Last Year: Never true    Ran Out of Food in the Last Year: Never true  Transportation Needs: No Transportation Needs (05/24/2023)   PRAPARE - Administrator, Civil Service (Medical): No    Lack of Transportation (Non-Medical): No  Physical Activity: Sufficiently Active (05/24/2023)   Exercise Vital Sign    Days of Exercise per Week: 3 days    Minutes of Exercise per Session: 150+ min  Stress: No Stress Concern Present (05/24/2023)   [employer] of Occupational Health - Occupational Stress Questionnaire    Feeling of Stress : Not at all  Social Connections: Socially Integrated (05/24/2023)  Social Connection and Isolation Panel    Frequency of Communication with Friends and Family: More than three times a week    Frequency of Social Gatherings with Friends and Family: Three times a week    Attends Religious Services: More than 4 times per year    Active Member of Clubs or Organizations: Yes    Attends Banker Meetings: More than 4 times per year    Marital Status: Married  Catering manager Violence: Not At Risk (11/05/2022)   Humiliation, Afraid, Rape, and Kick questionnaire    Fear of Current or Ex-Partner: No    Emotionally Abused: No    Physically Abused: No    Sexually Abused: No    Review of Systems:  All other review of systems negative except as mentioned in the HPI.  Physical Exam: Vital signs BP 126/78 (BP Location: Right Arm, Patient Position: Sitting, Cuff Size: Normal)   Pulse 83   Temp (!) 97.2 F (36.2 C) (Temporal)   Ht 5' 6 (1.676 m)   Wt 138 lb (62.6 kg)   SpO2 96%   BMI 22.27 kg/m   General:   Alert,  Well-developed, well-nourished, pleasant and cooperative in NAD Airway:  Mallampati 2 Lungs:  Clear throughout to auscultation.   Heart:  Regular rate and rhythm; no murmurs, clicks, rubs,  or  gallops. Abdomen:  Soft, nontender and nondistended. Normal bowel sounds.   Neuro/Psych:  Normal mood and affect. A and O x 3   Ileigh Mettler E. Stacia, MD [facility]

## 2023-10-04 NOTE — Patient Instructions (Signed)

## 2023-10-04 NOTE — Progress Notes (Addendum)
 Vitals-SS  Pt's states no medical or surgical changes since previsit or office visit. Enema given by pt states that nothing came out. Suggested to sit on toilet to see if anything will come out. Dr and CRNA notified.  Patient has an insulin  pump.

## 2023-10-07 ENCOUNTER — Telehealth: Payer: Self-pay

## 2023-10-07 NOTE — Telephone Encounter (Signed)
 Left message

## 2023-10-09 ENCOUNTER — Encounter: Payer: Self-pay | Admitting: Gastroenterology

## 2023-10-09 ENCOUNTER — Encounter: Payer: Self-pay | Admitting: Family Medicine

## 2023-10-10 NOTE — Telephone Encounter (Signed)
 Left message to return call to our office at their convenience.

## 2023-10-10 NOTE — Telephone Encounter (Signed)
 Ct report needs to be mailed to pt.

## 2023-10-10 NOTE — Telephone Encounter (Signed)
 Spoke with patient. Has a visit tomorrow

## 2023-10-11 ENCOUNTER — Ambulatory Visit (INDEPENDENT_AMBULATORY_CARE_PROVIDER_SITE_OTHER): Admitting: Family Medicine

## 2023-10-11 ENCOUNTER — Encounter: Payer: Self-pay | Admitting: Family Medicine

## 2023-10-11 VITALS — BP 112/61 | HR 80 | Temp 97.3°F | Ht 66.0 in | Wt 138.4 lb

## 2023-10-11 DIAGNOSIS — N401 Enlarged prostate with lower urinary tract symptoms: Secondary | ICD-10-CM | POA: Diagnosis not present

## 2023-10-11 DIAGNOSIS — N3281 Overactive bladder: Secondary | ICD-10-CM | POA: Diagnosis not present

## 2023-10-11 LAB — POCT URINALYSIS DIPSTICK
Bilirubin, UA: NEGATIVE
Blood, UA: NEGATIVE
Glucose, UA: NEGATIVE
Ketones, UA: NEGATIVE
Leukocytes, UA: NEGATIVE
Nitrite, UA: NEGATIVE
Protein, UA: NEGATIVE
Spec Grav, UA: 1.01 (ref 1.010–1.025)
Urobilinogen, UA: 0.2 U/dL
pH, UA: 6 (ref 5.0–8.0)

## 2023-10-11 MED ORDER — NITROFURANTOIN MONOHYD MACRO 100 MG PO CAPS: 100.0000 mg | ORAL_CAPSULE | Freq: Two times a day (BID) | ORAL | 0 refills | Status: DC

## 2023-10-11 NOTE — Patient Instructions (Addendum)
 It was very nice to see you today!  VISIT SUMMARY: You visited us  today with symptoms that suggest a urinary tract infection (UTI). We have started you on medication and are awaiting further test results to confirm the diagnosis.  YOUR PLAN: URINARY TRACT INFECTION: You have symptoms that suggest a urinary tract infection, such as an urgent need to urinate, especially in the evening. -We have prescribed Macrobid  100 mg to be taken twice a day. -A urine culture has been ordered to confirm the infection. -We will discuss the culture results and adjust your treatment plan on Monday.  Return if symptoms worsen or fail to improve.   Take care, Dr Kennyth  PLEASE NOTE:  If you had any lab tests, please let us  know if you have not heard back within a few days. You may see your results on mychart before we have a chance to review them but we will give you a call once they are reviewed by us .   If we ordered any referrals today, please let us  know if you have not heard from their office within the next week.   If you had any urgent prescriptions sent in today, please check with the pharmacy within an hour of our visit to make sure the prescription was transmitted appropriately.   Please try these tips to maintain a healthy lifestyle:  Eat at least 3 REAL meals and 1-2 snacks per day.  Aim for no more than 5 hours between eating.  If you eat breakfast, please do so within one hour of getting up.   Each meal should contain half fruits/vegetables, one quarter protein, and one quarter carbs (no bigger than a computer mouse)  Cut down on sweet beverages. This includes juice, soda, and sweet tea.   Drink at least 1 glass of water  with each meal and aim for at least 8 glasses per day  Exercise at least 150 minutes every week.

## 2023-10-11 NOTE — Assessment & Plan Note (Signed)
 May be contributing to his above symptoms.  Urine culture is pending though we are empirically starting Macrobid  while we await results.  Would consider trial of antispasmodic if culture is negative and symptoms persist.

## 2023-10-11 NOTE — Progress Notes (Signed)
   David Maynard is a 73 y.o. male who presents today for an office visit.  Assessment/Plan:  New/Acute Problems: Urinary Urgency  Symptoms consistent with previous UTIs.  No red flags or signs of systemic illness.  Will start Macrobid  100 mg twice daily while await culture results.  Encouraged hydration.  We discussed reasons to return to care.  Follow-up as needed.  Chronic Problems Addressed Today: OAB (overactive bladder) May be contributing to his above symptoms.  Urine culture is pending though we are empirically starting Macrobid  while we await results.  Would consider trial of antispasmodic if culture is negative and symptoms persist.  BPH  Following with urology for this.  On tadalafil .  We are treating for presumed UTI as above    Subjective:  HPI:  See assessment / plan for status of chronic conditions.   Discussed the use of AI scribe software for clinical note transcription with the patient, who gave verbal consent to proceed.  History of Present Illness David Maynard is a 73 year old male who presents with symptoms suggestive of a urinary tract infection.  He began experiencing symptoms indicative of a urinary tract infection earlier in the week, characterized by an urgent need to urinate, particularly noticeable during the evening while watching baseball. Over the last couple of days, these symptoms have improved. No fever, chills, hematuria, or dysuria, as he describes the sensation as urgency rather than pain.  He remains active, having played golf on Thursday and tennis on the morning of the visit.  He recently received a COVID-19 vaccine update and plans to space out his flu shot by a couple of weeks. He also mentions having a prescription for Florical for skin issues, which was delayed but has since been filled.  Family history includes his mother having had a 'falling bladder,' but previous tests, including an ultrasound, have ruled out similar issues for him.  His wife is a Advice worker, his oldest son is a physical therapist, and his youngest son is an Magazine features editor.         Objective:  Physical Exam: BP 112/61   Pulse 80   Temp (!) 97.3 F (36.3 C) (Temporal)   Ht 5' 6 (1.676 m)   Wt 138 lb 6.4 oz (62.8 kg)   SpO2 95%   BMI 22.34 kg/m   Gen: No acute distress, resting comfortably CV: Regular rate and rhythm with no murmurs appreciated Pulm: Normal work of breathing, clear to auscultation bilaterally with no crackles, wheezes, or rhonchi Neuro: Grossly normal, moves all extremities Psych: Normal affect and thought content      Brittyn Salaz M. Kennyth, MD 10/11/2023 3:01 PM

## 2023-10-11 NOTE — Assessment & Plan Note (Signed)
 Following with urology for this.  On tadalafil .  We are treating for presumed UTI as above

## 2023-10-12 LAB — URINE CULTURE
MICRO NUMBER:: 16992073
Result:: NO GROWTH
SPECIMEN QUALITY:: ADEQUATE

## 2023-10-14 ENCOUNTER — Ambulatory Visit: Payer: Self-pay | Admitting: Family Medicine

## 2023-10-14 NOTE — Progress Notes (Signed)
 His urine culture is negative.  It is okay for him to stop antibiotics.  He should let us  know if his symptoms are not improving.

## 2023-10-19 ENCOUNTER — Encounter: Payer: Self-pay | Admitting: Internal Medicine

## 2023-10-19 DIAGNOSIS — E119 Type 2 diabetes mellitus without complications: Secondary | ICD-10-CM

## 2023-10-21 ENCOUNTER — Other Ambulatory Visit

## 2023-10-21 MED ORDER — ACCU-CHEK GUIDE TEST VI STRP: ORAL_STRIP | 12 refills | Status: AC

## 2023-10-21 MED ORDER — ACCU-CHEK GUIDE W/DEVICE KIT: PACK | 0 refills | Status: AC

## 2023-10-21 MED ORDER — ACCU-CHEK SOFTCLIX LANCETS MISC: 3 refills | Status: AC

## 2023-10-22 LAB — T4, FREE: Free T4: 1 ng/dL (ref 0.8–1.8)

## 2023-10-22 LAB — TSH: TSH: 7.14 m[IU]/L — ABNORMAL HIGH (ref 0.40–4.50)

## 2023-10-24 ENCOUNTER — Other Ambulatory Visit: Payer: Self-pay | Admitting: Internal Medicine

## 2023-10-24 DIAGNOSIS — E039 Hypothyroidism, unspecified: Secondary | ICD-10-CM

## 2023-11-10 ENCOUNTER — Encounter: Payer: Self-pay | Admitting: Family Medicine

## 2023-11-13 NOTE — Telephone Encounter (Signed)
 Noted

## 2023-11-25 ENCOUNTER — Encounter: Payer: Self-pay | Admitting: Radiology

## 2023-11-25 ENCOUNTER — Ambulatory Visit (INDEPENDENT_AMBULATORY_CARE_PROVIDER_SITE_OTHER)

## 2023-11-25 VITALS — BP 123/70 | HR 72 | Temp 96.8°F | Ht 67.0 in | Wt 138.0 lb

## 2023-11-25 DIAGNOSIS — Z0000 Encounter for general adult medical examination without abnormal findings: Secondary | ICD-10-CM | POA: Diagnosis not present

## 2023-11-25 NOTE — Patient Instructions (Signed)
 David Maynard,  Thank you for taking the time for your Medicare Wellness Visit. I appreciate your continued commitment to your health goals. Please review the care plan we discussed, and feel free to reach out if I can assist you further.  Please note that Annual Wellness Visits do not include a physical exam. Some assessments may be limited, especially if the visit was conducted virtually. If needed, we may recommend an in-person follow-up with your provider.  Ongoing Care Seeing your primary care provider every 3 to 6 months helps us  monitor your health and provide consistent, personalized care.   Referrals If a referral was made during today's visit and you haven't received any updates within two weeks, please contact the referred provider directly to check on the status.  Recommended Screenings:  Health Maintenance  Topic Date Due   Medicare Annual Wellness Visit  11/05/2023   DTaP/Tdap/Td vaccine (3 - Td or Tdap) 11/10/2023   Flu Shot  04/21/2024*   Hemoglobin A1C  03/04/2024   Complete foot exam   03/10/2024   COVID-19 Vaccine (10 - Mixed Product risk 2025-26 season) 04/09/2024   Yearly kidney health urinalysis for diabetes  05/30/2024   Yearly kidney function blood test for diabetes  06/09/2024   Eye exam for diabetics  06/09/2024   Colon Cancer Screening  10/03/2028   Pneumococcal Vaccine for age over 66  Completed   Hepatitis C Screening  Completed   Zoster (Shingles) Vaccine  Completed   Meningitis B Vaccine  Aged Out  *Topic was postponed. The date shown is not the original due date.       11/05/2022    2:15 PM  Advanced Directives  Does Patient Have a Medical Advance Directive? Yes  Type of Estate Agent of [place];Living will  Does patient want to make changes to medical advance directive? No - Patient declined  Copy of Healthcare Power of Attorney in Chart? Yes - validated most recent copy scanned in chart (See row information)    Vision:  Annual vision screenings are recommended for early detection of glaucoma, cataracts, and diabetic retinopathy. These exams can also reveal signs of chronic conditions such as diabetes and high blood pressure.  Dental: Annual dental screenings help detect early signs of oral cancer, gum disease, and other conditions linked to overall health, including heart disease and diabetes.  Please see the attached documents for additional preventive care recommendations.

## 2023-11-25 NOTE — Progress Notes (Signed)
 Subjective:   David Maynard is a 73 y.o. male who presents for a Medicare Annual Wellness Visit.  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   Thyroid  disease    Unspecified hypothyroidism    Past Surgical History:  Procedure Laterality Date   COLONOSCOPY     COLONOSCOPY WITH PROPOFOL   02/28/2021   ESOPHAGOGASTRODUODENOSCOPY  06/16/2003   EYE SURGERY     INGUINAL HERNIA REPAIR     left   JOINT REPLACEMENT     KNEE ARTHROSCOPY     Left   KNEE ARTHROSCOPY W/ ACL RECONSTRUCTION     left   PENILE PROSTHESIS IMPLANT N/A 02/27/2022   Procedure: INSERTION OF INFLATABLE PENILE IMPLANT;  Surgeon: Lovie Arlyss CROME, MD;  Location: [facility];  Service: Urology;  Laterality: N/A;  90 MINUTES NEEDED FOR CASE   ROTATOR CUFF REPAIR     Right x2   Stress Cardiolite  07/15/2000   TOTAL KNEE ARTHROPLASTY Right 11/04/2017   TOTAL KNEE ARTHROPLASTY Right 11/04/2017   Procedure: TOTAL KNEE ARTHROPLASTY;  Surgeon: Jane Charleston, MD;  Location: [facility] OR;  Service: Orthopedics;  Laterality: Right;   TOTAL KNEE ARTHROPLASTY Left 12/23/2017   TOTAL KNEE ARTHROPLASTY Left 12/23/2017   Procedure: TOTAL KNEE ARTHROPLASTY;  Surgeon: Jane Charleston, MD;  [facility];  Service: Orthopedics;  Laterality: Left;   VASECTOMY  05/2002   Family History  Problem Relation Age of Onset   Hypertension Mother    Kidney disease Father     Breast cancer Neg Hx    Celiac disease Neg Hx    Cirrhosis Neg Hx    Clotting disorder Neg Hx    Colitis Neg Hx    Colon cancer Neg Hx    Colon polyps Neg Hx    Crohn's disease Neg Hx    Cystic fibrosis Neg Hx    Diabetes Neg Hx    Esophageal cancer Neg Hx    Heart disease Neg Hx    Hemochromatosis Neg Hx    Inflammatory bowel disease Neg Hx    Irritable bowel syndrome Neg Hx    Liver cancer Neg Hx    Liver disease Neg Hx    Ovarian cancer Neg Hx    Pancreatic cancer Neg Hx    Prostate cancer Neg Hx    Rectal cancer Neg Hx    Stomach cancer Neg Hx    Ulcerative colitis Neg Hx    Uterine cancer Neg Hx    Wilson's disease Neg Hx    Social History   Occupational History   Occupation: Retired water engineer for [employer]  Tobacco Use   Smoking status: Never   Smokeless tobacco: Never  Vaping Use   Vaping status: Never Used  Substance and Sexual Activity   Alcohol use: Yes    Alcohol/week: 1.0 standard drink of alcohol  Types: 1 Cans of beer per week    Comment: socially   Drug use: No   Sexual activity: Not on file   Tobacco Counseling Counseling given: Not Answered  SDOH Screenings   Food Insecurity: No Food Insecurity (11/24/2023)  Housing: Low Risk  (11/24/2023)  Transportation Needs: No Transportation Needs (11/24/2023)  Utilities: Not At Risk (11/25/2023)  Alcohol Screen: Low Risk  (11/24/2023)  Depression (PHQ2-9): Low Risk  (11/25/2023)  Financial Resource Strain: Low Risk  (11/24/2023)  Physical Activity: Sufficiently Active (11/24/2023)  Social Connections: Socially Integrated (11/24/2023)  Stress: No Stress Concern Present (11/24/2023)  Tobacco Use: Low Risk  (11/25/2023)  Health Literacy: Adequate Health Literacy (11/25/2023)   Depression Screen    11/25/2023    2:16 PM 10/11/2023    2:36 PM 05/31/2023    1:55 PM 12/05/2022    9:10 AM 11/05/2022    2:15 PM 03/19/2022    1:14 PM 01/29/2022    2:30 PM  PHQ 2/9 Scores  PHQ - 2 Score 0 0 0 0 0 0 0      Goals Addressed               This Visit's Progress     normal A1C (pt-stated)         Visit info / Clinical Intake: Medicare Wellness Visit Type:: Subsequent Annual Wellness Visit Medicare Wellness Visit Mode:: Video If telephone or video:: pt reported vitals Interpreter Needed?: No Pre-visit prep was completed: yes AWV questionnaire completed by patient prior to visit?: yes Date:: 11/24/23 Living arrangements:: lives with spouse/significant other Patient's Overall Health Status Rating: very good Typical amount of pain: none Does pain affect daily life?: no Are you currently prescribed opioids?: no  Dietary Habits and Nutritional Risks How many meals a day?: 3 Eats fruit and vegetables daily?: yes Most meals are obtained by: eating out; preparing own meals Diabetic:: (!) yes Any non-healing wounds?: no How often do you check your BS?: continuous glucose monitor; as needed Would you like to be referred to a Nutritionist or for Diabetic Management? : no  Functional Status Activities of Daily Living (to include ambulation/medication): (Patient-Rptd) Independent Ambulation: Independent with device- listed below Home Assistive Devices/Equipment: Other (Comment) (multifocal lens) Medication Administration: Independent Home Management: (Patient-Rptd) Independent Manage your own finances?: yes Primary transportation is: driving Concerns about vision?: no *vision screening is required for [facility]* Concerns about hearing?: (!) yes Uses hearing aids?: (!) yes (hearing aids)  Fall Screening Falls in the past year?: (Patient-Rptd) 1 Number of falls in past year: 1 Was there an injury with Fall?: 1 (right side) Fall Risk Category Calculator: 3 Patient Fall Risk Level: High Fall Risk  Fall Risk Patient at Risk for Falls Due to: History of fall(s) Fall risk Follow up: Falls prevention discussed  Home and Transportation Safety: All rugs have non-skid backing?: yes All stairs  or steps have railings?: yes Grab bars in the bathtub or shower?: yes Have non-skid surface in bathtub or shower?: yes Good home lighting?: yes Regular seat belt use?: yes Hospital stays in the last year:: no  Cognitive Assessment Difficulty concentrating, remembering, or making decisions? : no Will 6CIT or Mini Cog be Completed: no 6CIT or Mini Cog Declined: patient alert, oriented, able to answer questions appropriately and recall recent events  Advance Directives (For Healthcare) Does Patient Have a Medical Advance Directive?: Yes Type of Advance Directive: Healthcare Power of Attorney Copy of Healthcare Power of Attorney in Chart?: Yes - validated most recent copy  scanned in chart (See row information)  Reviewed/Updated  Reviewed/Updated: All        Objective:    Today's Vitals   11/25/23 1401  BP: 123/70  Pulse: 72  Temp: (!) 96.8 F (36 C)  Weight: 138 lb (62.6 kg)  Height: 5' 7 (1.702 m)   Body mass index is 21.61 kg/m.  Current Medications (verified) Outpatient Encounter Medications as of 11/25/2023  Medication Sig   Accu-Chek Softclix Lancets lancets Use to check blood sugar 6 times a day   Accu-Chek Softclix Lancets lancets Use as instructed 1x a day   ascorbic acid  (VITAMIN C) 500 MG tablet Take 1 tablet (500 mg total) by mouth daily.   aspirin  EC 81 MG tablet Take 81 mg by mouth daily. Swallow whole.   atorvastatin  (LIPITOR) 40 MG tablet TAKE 1 TABLET BY MOUTH DAILY   Blood Glucose Monitoring Suppl (ACCU-CHEK GUIDE ME) w/Device KIT Use to check blood sugar 6 times a day   Blood Glucose Monitoring Suppl (ACCU-CHEK GUIDE) w/Device KIT Use as advised   Calcium  Carbonate (CALCIUM  600 PO) Take 1 tablet by mouth daily.   Cinnamon 500 MG capsule Take 3,000 mg by mouth 2 (two) times daily.   colesevelam  (WELCHOL ) 625 MG tablet TAKE 2 TABLETS BY MOUTH TWICE  DAILY WITH A MEAL   Continuous Glucose Sensor (DEXCOM G7 SENSOR) MISC USE TO CHECK BLOOD GLUCOSE   CONTINUOUSLY, CHANGE SENSOR  EVERY 10 DAYS   docusate sodium  (COLACE) 100 MG capsule Take 100 mg by mouth 2 (two) times daily. Takes daily   Glucagon  3 MG/DOSE POWD Place 3 mg into the nose once as needed for up to 1 dose.   Glucosamine-Chondroit-Vit C-Mn (GLUCOSAMINE CHONDR 1500 COMPLX PO) Take 2 tablets by mouth daily.   glucose blood (ACCU-CHEK GUIDE TEST) test strip CHECK BLOOD SUGAR 6 TIMES DAILY   glucose blood (ACCU-CHEK GUIDE TEST) test strip Use as instructed   Insulin  Disposable Pump (OMNIPOD 5 DEXG7G6 PODS GEN 5) MISC USE 1 POD EVERY 3 DAYS   insulin  lispro (HUMALOG ) 100 UNIT/ML cartridge Inject 0.5 mLs (50 Units total) into the skin daily. Via pump   insulin  lispro (HUMALOG ) 100 UNIT/ML injection INJECT SUBCUTANEOUSLY 60 UNITS  DAILY VIA INSULIN  PUMP   Insulin  Lispro-aabc 100 UNIT/ML SOLN Inject 50 Units as directed daily. Via pump   Insulin  Pen Needle 31G X 5 MM MISC 1 Device by Does not apply route in the morning and at bedtime.   Insulin  Syringe/Needle U-500 (BD INSULIN  SYRINGE U-500) 31G X 0.5 ML MISC Use to inject insulin  3 times a day if pump malfunctions.   levothyroxine  (SYNTHROID ) 100 MCG tablet Take 1 tablet (100 mcg total) by mouth daily.   metFORMIN  (GLUCOPHAGE -XR) 500 MG 24 hr tablet TAKE 2 TABLETS BY MOUTH TWICE  DAILY   Multiple Vitamin (MULTIVITAMIN) tablet Take 1 tablet by mouth daily.   MYRBETRIQ  50 MG TB24 tablet Take 50 mg by mouth daily.   omega-3 acid ethyl esters (LOVAZA ) 1 g capsule TAKE 2 CAPSULES BY MOUTH TWICE  DAILY   Semaglutide ,0.25 or 0.5MG /DOS, (OZEMPIC , 0.25 OR 0.5 MG/DOSE,) 2 MG/3ML SOPN INJECT SUBCUTANEOUSLY 0.5 MG  EVERY WEEK   tadalafil  (ADCIRCA /CIALIS ) 20 MG tablet Take 20 mg by mouth daily as needed for erectile dysfunction.   Trospium Chloride 60 MG CP24 Take 60 mg by mouth daily.   Turmeric 500 MG CAPS Take 500-1,000 mg by mouth daily. May take an extra dose if plays golf or tennis  VITAMIN D , CHOLECALCIFEROL , PO Take 1 tablet by mouth  daily.   vitamin E 200 UNIT capsule Take 200 Units by mouth daily.   [DISCONTINUED] nitrofurantoin , macrocrystal-monohydrate, (MACROBID ) 100 MG capsule Take 1 capsule (100 mg total) by mouth 2 (two) times daily.   No facility-administered encounter medications on file as of 11/25/2023.   Hearing/Vision screen Hearing Screening - Comments:: Pt has hearing aids  Vision Screening - Comments:: Wears rx glasses - up to date with routine eye exams with Dr Devere Kitty  Immunizations and Health Maintenance Health Maintenance  Topic Date Due   DTaP/Tdap/Td (3 - Td or Tdap) 11/10/2023   HEMOGLOBIN A1C  03/04/2024   FOOT EXAM  03/10/2024   COVID-19 Vaccine (10 - Mixed Product risk 2025-26 season) 04/09/2024   Diabetic kidney evaluation - Urine ACR  05/30/2024   Diabetic kidney evaluation - eGFR measurement  06/09/2024   OPHTHALMOLOGY EXAM  06/09/2024   Medicare Annual Wellness (AWV)  11/24/2024   Colonoscopy  10/03/2028   Pneumococcal Vaccine: 50+ Years  Completed   Influenza Vaccine  Completed   Hepatitis C Screening  Completed   Zoster Vaccines- Shingrix   Completed   Meningococcal B Vaccine  Aged Out        Assessment/Plan:  This is a routine wellness examination for [place].  Patient Care Team: Kitty Worth HERO, MD as PCP - General (Family Medicine) Colon Shove, MD as Attending Physician (Neurosurgery) Kassie Mallick, MD (Inactive) as Consulting Physician (Endocrinology) Jane Charleston, MD as Consulting Physician (Orthopedic Surgery) Gretta Robynn HERO, DMD as Consulting Physician (Dentistry) [facility] as Consulting Physician (Orthopedic Surgery) Baldwin Ubaldo Motto, NP as Nurse Practitioner (Nurse Practitioner) Camillo Golas, MD as Consulting Physician (Ophthalmology)  I have personally reviewed and noted the following in the patient's chart:   Medical and social history Use of alcohol, tobacco or illicit drugs  Current medications and supplements including  opioid prescriptions. Functional ability and status Nutritional status Physical activity Advanced directives List of other physicians Hospitalizations, surgeries, and ER visits in previous 12 months Vitals Screenings to include cognitive, depression, and falls Referrals and appointments  No orders of the defined types were placed in this encounter.  In addition, I have reviewed and discussed with patient certain preventive protocols, quality metrics, and best practice recommendations. A written personalized care plan for preventive services as well as general preventive health recommendations were provided to patient.   David VEAR Haws, LPN   [id number]   Return in 1 year (on 11/24/2024).  After Visit Summary: (MyChart) Due to this being a telephonic visit, the after visit summary with patients personalized plan was offered to patient via MyChart   Nurse Notes: no

## 2023-11-26 NOTE — Addendum Note (Signed)
 Addended by: JAYLENE ELLOUISE DEL on: 11/26/2023 10:31 AM   Modules accepted: Orders

## 2023-11-26 NOTE — Progress Notes (Signed)
 Questionnaires completed by patient on 11/21/2023 and 11/24/2023 and no changes since completing them. Health Advisor completed what questionnaires patient did not complete during the video visit on 11/25/2023 Subjective:   David Maynard is a 73 y.o. male who presents for a Medicare Annual Wellness Visit.  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   Thyroid  disease    Unspecified hypothyroidism    Past Surgical History:  Procedure Laterality Date   COLONOSCOPY     COLONOSCOPY WITH PROPOFOL   02/28/2021   ESOPHAGOGASTRODUODENOSCOPY  06/16/2003   EYE SURGERY     INGUINAL HERNIA REPAIR     left   JOINT REPLACEMENT     KNEE ARTHROSCOPY     Left   KNEE ARTHROSCOPY W/ ACL RECONSTRUCTION     left   PENILE PROSTHESIS IMPLANT N/A 02/27/2022   Procedure: INSERTION OF INFLATABLE PENILE IMPLANT;  Surgeon: Lovie Arlyss CROME, MD;  Location: [facility];  Service: Urology;  Laterality: N/A;  90 MINUTES NEEDED FOR CASE   ROTATOR CUFF REPAIR     Right x2   Stress Cardiolite  07/15/2000   TOTAL KNEE ARTHROPLASTY Right 11/04/2017   TOTAL KNEE ARTHROPLASTY Right 11/04/2017   Procedure: TOTAL KNEE ARTHROPLASTY;  Surgeon: Jane Charleston, MD;  Location: [facility] OR;  Service: Orthopedics;  Laterality: Right;   TOTAL KNEE ARTHROPLASTY Left 12/23/2017   TOTAL KNEE ARTHROPLASTY Left 12/23/2017   Procedure: TOTAL KNEE ARTHROPLASTY;   Surgeon: Jane Charleston, MD;  Location: [facility] OR;  Service: Orthopedics;  Laterality: Left;   VASECTOMY  05/2002   Family History  Problem Relation Age of Onset   Hypertension Mother    Kidney disease Father    Breast cancer Neg Hx    Celiac disease Neg Hx    Cirrhosis Neg Hx    Clotting disorder Neg Hx    Colitis Neg Hx    Colon cancer Neg Hx    Colon polyps Neg Hx    Crohn's disease Neg Hx    Cystic fibrosis Neg Hx    Diabetes Neg Hx    Esophageal cancer Neg Hx    Heart disease Neg Hx    Hemochromatosis Neg Hx    Inflammatory bowel disease Neg Hx    Irritable bowel syndrome Neg Hx    Liver cancer Neg Hx    Liver disease Neg Hx    Ovarian cancer Neg Hx    Pancreatic cancer Neg Hx    Prostate cancer Neg Hx    Rectal cancer Neg Hx    Stomach cancer Neg Hx    Ulcerative colitis Neg Hx    Uterine cancer Neg Hx    Wilson's disease Neg Hx    Social History   Occupational History   Occupation: Retired water engineer for [employer]  Tobacco Use   Smoking status: Never   Smokeless tobacco:  Never  Vaping Use   Vaping status: Never Used  Substance and Sexual Activity   Alcohol use: Yes    Alcohol/week: 1.0 standard drink of alcohol    Types: 1 Cans of beer per week    Comment: socially   Drug use: No   Sexual activity: Not on file   Tobacco Counseling Counseling given: Not Answered  SDOH Screenings   Food Insecurity: No Food Insecurity (11/24/2023)  Housing: Low Risk  (11/24/2023)  Transportation Needs: No Transportation Needs (11/24/2023)  Utilities: Not At Risk (11/25/2023)  Alcohol Screen: Low Risk  (11/24/2023)  Depression (PHQ2-9): Low Risk  (11/25/2023)  Financial Resource Strain: Low Risk  (11/24/2023)  Physical Activity: Sufficiently Active (11/24/2023)  Social Connections: Socially Integrated (11/24/2023)  Stress: No Stress Concern Present (11/24/2023)  Tobacco Use: Low Risk  (11/25/2023)  Health Literacy: Adequate Health Literacy (11/25/2023)   Depression  Screen    11/25/2023    2:16 PM 10/11/2023    2:36 PM 05/31/2023    1:55 PM 12/05/2022    9:10 AM 11/05/2022    2:15 PM 03/19/2022    1:14 PM 01/29/2022    2:30 PM  PHQ 2/9 Scores  PHQ - 2 Score 0 0 0 0 0 0 0  PHQ- 9 Score 0           Goals Addressed               This Visit's Progress     normal A1C (pt-stated)         Visit info / Clinical Intake: Medicare Wellness Visit Type:: Subsequent Annual Wellness Visit Medicare Wellness Visit Mode:: Video If telephone or video:: pt reported vitals Interpreter Needed?: No Pre-visit prep was completed: yes AWV questionnaire completed by patient prior to visit?: yes Date:: 11/24/23 Living arrangements:: lives with spouse/significant other Patient's Overall Health Status Rating: very good Typical amount of pain: none Does pain affect daily life?: no Are you currently prescribed opioids?: no  Dietary Habits and Nutritional Risks How many meals a day?: 3 Eats fruit and vegetables daily?: yes Most meals are obtained by: eating out; preparing own meals Diabetic:: (!) yes Any non-healing wounds?: no How often do you check your BS?: continuous glucose monitor; as needed Would you like to be referred to a Nutritionist or for Diabetic Management? : no  Functional Status Activities of Daily Living (to include ambulation/medication): (Patient-Rptd) Independent Ambulation: Independent with device- listed below Home Assistive Devices/Equipment: Other (Comment) (multifocal lens) Medication Administration: Independent Home Management: (Patient-Rptd) Independent Manage your own finances?: yes Primary transportation is: driving Concerns about vision?: no *vision screening is required for [facility]* Concerns about hearing?: (!) yes Uses hearing aids?: (!) yes (hearing aids)  Fall Screening Falls in the past year?: (Patient-Rptd) 1 Number of falls in past year: 1 Was there an injury with Fall?: 1 (right side) Fall Risk Category Calculator:  3 Patient Fall Risk Level: High Fall Risk  Fall Risk Patient at Risk for Falls Due to: History of fall(s) Fall risk Follow up: Falls prevention discussed  Home and Transportation Safety: All rugs have non-skid backing?: yes All stairs or steps have railings?: yes Grab bars in the bathtub or shower?: yes Have non-skid surface in bathtub or shower?: yes Good home lighting?: yes Regular seat belt use?: yes Hospital stays in the last year:: no  Cognitive Assessment Difficulty concentrating, remembering, or making decisions? : no Will 6CIT or Mini Cog be Completed: no 6CIT or Mini Cog Declined: patient alert, oriented, able  to answer questions appropriately and recall recent events  Advance Directives (For Healthcare) Does Patient Have a Medical Advance Directive?: Yes Type of Advance Directive: Healthcare Power of Attorney Copy of Healthcare Power of Attorney in Chart?: Yes - validated most recent copy scanned in chart (See row information)  Reviewed/Updated  Reviewed/Updated: All        Objective:    Today's Vitals   11/25/23 1401  BP: 123/70  Pulse: 72  Temp: (!) 96.8 F (36 C)  Weight: 138 lb (62.6 kg)  Height: 5' 7 (1.702 m)   Body mass index is 21.61 kg/m.  Current Medications (verified) Outpatient Encounter Medications as of 11/25/2023  Medication Sig   Accu-Chek Softclix Lancets lancets Use to check blood sugar 6 times a day   Accu-Chek Softclix Lancets lancets Use as instructed 1x a day   ascorbic acid  (VITAMIN C) 500 MG tablet Take 1 tablet (500 mg total) by mouth daily.   aspirin  EC 81 MG tablet Take 81 mg by mouth daily. Swallow whole.   atorvastatin  (LIPITOR) 40 MG tablet TAKE 1 TABLET BY MOUTH DAILY   Blood Glucose Monitoring Suppl (ACCU-CHEK GUIDE ME) w/Device KIT Use to check blood sugar 6 times a day   Blood Glucose Monitoring Suppl (ACCU-CHEK GUIDE) w/Device KIT Use as advised   Calcium  Carbonate (CALCIUM  600 PO) Take 1 tablet by mouth daily.    Cinnamon 500 MG capsule Take 3,000 mg by mouth 2 (two) times daily.   colesevelam  (WELCHOL ) 625 MG tablet TAKE 2 TABLETS BY MOUTH TWICE  DAILY WITH A MEAL   Continuous Glucose Sensor (DEXCOM G7 SENSOR) MISC USE TO CHECK BLOOD GLUCOSE  CONTINUOUSLY, CHANGE SENSOR  EVERY 10 DAYS   docusate sodium  (COLACE) 100 MG capsule Take 100 mg by mouth 2 (two) times daily. Takes daily   Glucagon  3 MG/DOSE POWD Place 3 mg into the nose once as needed for up to 1 dose.   Glucosamine-Chondroit-Vit C-Mn (GLUCOSAMINE CHONDR 1500 COMPLX PO) Take 2 tablets by mouth daily.   glucose blood (ACCU-CHEK GUIDE TEST) test strip CHECK BLOOD SUGAR 6 TIMES DAILY   glucose blood (ACCU-CHEK GUIDE TEST) test strip Use as instructed   Insulin  Disposable Pump (OMNIPOD 5 DEXG7G6 PODS GEN 5) MISC USE 1 POD EVERY 3 DAYS   insulin  lispro (HUMALOG ) 100 UNIT/ML cartridge Inject 0.5 mLs (50 Units total) into the skin daily. Via pump   insulin  lispro (HUMALOG ) 100 UNIT/ML injection INJECT SUBCUTANEOUSLY 60 UNITS  DAILY VIA INSULIN  PUMP   Insulin  Lispro-aabc 100 UNIT/ML SOLN Inject 50 Units as directed daily. Via pump   Insulin  Pen Needle 31G X 5 MM MISC 1 Device by Does not apply route in the morning and at bedtime.   Insulin  Syringe/Needle U-500 (BD INSULIN  SYRINGE U-500) 31G X 0.5 ML MISC Use to inject insulin  3 times a day if pump malfunctions.   levothyroxine  (SYNTHROID ) 100 MCG tablet Take 1 tablet (100 mcg total) by mouth daily.   metFORMIN  (GLUCOPHAGE -XR) 500 MG 24 hr tablet TAKE 2 TABLETS BY MOUTH TWICE  DAILY   Multiple Vitamin (MULTIVITAMIN) tablet Take 1 tablet by mouth daily.   MYRBETRIQ  50 MG TB24 tablet Take 50 mg by mouth daily.   omega-3 acid ethyl esters (LOVAZA ) 1 g capsule TAKE 2 CAPSULES BY MOUTH TWICE  DAILY   Semaglutide ,0.25 or 0.5MG /DOS, (OZEMPIC , 0.25 OR 0.5 MG/DOSE,) 2 MG/3ML SOPN INJECT SUBCUTANEOUSLY 0.5 MG  EVERY WEEK   tadalafil  (ADCIRCA /CIALIS ) 20 MG tablet Take 20 mg by mouth  daily as needed for erectile  dysfunction.   Trospium Chloride 60 MG CP24 Take 60 mg by mouth daily.   Turmeric 500 MG CAPS Take 500-1,000 mg by mouth daily. May take an extra dose if plays golf or tennis   VITAMIN D , CHOLECALCIFEROL , PO Take 1 tablet by mouth daily.   vitamin E 200 UNIT capsule Take 200 Units by mouth daily.   [DISCONTINUED] nitrofurantoin , macrocrystal-monohydrate, (MACROBID ) 100 MG capsule Take 1 capsule (100 mg total) by mouth 2 (two) times daily.   No facility-administered encounter medications on file as of 11/25/2023.   Hearing/Vision screen Hearing Screening - Comments:: Pt has hearing aids  Vision Screening - Comments:: Wears rx glasses - up to date with routine eye exams with Dr Devere Kitty  Immunizations and Health Maintenance Health Maintenance  Topic Date Due   DTaP/Tdap/Td (3 - Td or Tdap) 11/10/2023   HEMOGLOBIN A1C  03/04/2024   FOOT EXAM  03/10/2024   COVID-19 Vaccine (10 - Mixed Product risk 2025-26 season) 04/09/2024   Diabetic kidney evaluation - Urine ACR  05/30/2024   Diabetic kidney evaluation - eGFR measurement  06/09/2024   OPHTHALMOLOGY EXAM  06/09/2024   Medicare Annual Wellness (AWV)  11/24/2024   Colonoscopy  10/03/2028   Pneumococcal Vaccine: 50+ Years  Completed   Influenza Vaccine  Completed   Hepatitis C Screening  Completed   Zoster Vaccines- Shingrix   Completed   Meningococcal B Vaccine  Aged Out        Assessment/Plan:  This is a routine wellness examination for [place].  Patient Care Team: Kitty Worth HERO, MD as PCP - General (Family Medicine) Colon Shove, MD as Attending Physician (Neurosurgery) Kassie Mallick, MD (Inactive) as Consulting Physician (Endocrinology) Jane Charleston, MD as Consulting Physician (Orthopedic Surgery) Gretta Robynn HERO, DMD as Consulting Physician (Dentistry) [facility] as Consulting Physician (Orthopedic Surgery) Baldwin Ubaldo Motto, NP as Nurse Practitioner (Nurse Practitioner) Camillo Golas, MD as  Consulting Physician (Ophthalmology)  I have personally reviewed and noted the following in the patient's chart:   Medical and social history Use of alcohol, tobacco or illicit drugs  Current medications and supplements including opioid prescriptions. Functional ability and status Nutritional status Physical activity Advanced directives List of other physicians Hospitalizations, surgeries, and ER visits in previous 12 months Vitals Screenings to include cognitive, depression, and falls Referrals and appointments  No orders of the defined types were placed in this encounter.  In addition, I have reviewed and discussed with patient certain preventive protocols, quality metrics, and best practice recommendations. A written personalized care plan for preventive services as well as general preventive health recommendations were provided to patient.   Ellouise VEAR Haws, LPN   [id number]/05/[id number]   Return in 1 year (on 11/24/2024).  After Visit Summary: (MyChart) Due to this being a telephonic visit, the after visit summary with patients personalized plan was offered to patient via MyChart   Nurse Notes: nothing significant at this time

## 2023-12-03 ENCOUNTER — Encounter: Payer: Self-pay | Admitting: Family Medicine

## 2023-12-04 ENCOUNTER — Encounter: Payer: Self-pay | Admitting: Family Medicine

## 2023-12-04 NOTE — Telephone Encounter (Signed)
 See labs, abstracted on 12/04/2023

## 2023-12-05 NOTE — Progress Notes (Addendum)
 I connected with  David Maynard on 11/25/23 by a video and audio enabled telemedicine application and verified that I am speaking with the correct person using two identifiers.  Patient Location: Home  Provider Location: Office/Clinic  Persons Participating in Visit: Patient.  I discussed the limitations of evaluation and management by telemedicine. The patient expressed understanding and agreed to proceed.  Vital Signs: Because this visit was a virtual/telehealth visit, some criteria may be missing or patient reported. Any vitals not documented were not able to be obtained and vitals that have been documented are patient reported.

## 2023-12-14 ENCOUNTER — Encounter: Payer: Self-pay | Admitting: Internal Medicine

## 2023-12-14 ENCOUNTER — Other Ambulatory Visit: Payer: Self-pay | Admitting: Internal Medicine

## 2023-12-14 DIAGNOSIS — E119 Type 2 diabetes mellitus without complications: Secondary | ICD-10-CM

## 2023-12-16 MED ORDER — INSULIN LISPRO 100 UNIT/ML IJ SOLN: INTRAMUSCULAR | 3 refills | Status: AC

## 2024-02-07 ENCOUNTER — Encounter: Payer: Self-pay | Admitting: Internal Medicine

## 2024-02-09 ENCOUNTER — Encounter: Payer: Self-pay | Admitting: Internal Medicine

## 2024-03-02 ENCOUNTER — Ambulatory Visit: Admitting: Internal Medicine

## 2024-06-01 ENCOUNTER — Encounter: Admitting: Family Medicine

## 2024-12-08 ENCOUNTER — Ambulatory Visit
